# Patient Record
Sex: Female | Born: 1965
Health system: Southern US, Community
[De-identification: ages and names within clinical notes are randomized; demographics above are authoritative.]

## PROBLEM LIST (undated history)

## (undated) DIAGNOSIS — Z5112 Encounter for antineoplastic immunotherapy: Secondary | ICD-10-CM

## (undated) DIAGNOSIS — J189 Pneumonia, unspecified organism: Secondary | ICD-10-CM

## (undated) DIAGNOSIS — G43909 Migraine, unspecified, not intractable, without status migrainosus: Secondary | ICD-10-CM

## (undated) DIAGNOSIS — C3490 Malignant neoplasm of unspecified part of unspecified bronchus or lung: Secondary | ICD-10-CM

## (undated) DIAGNOSIS — E785 Hyperlipidemia, unspecified: Secondary | ICD-10-CM

## (undated) DIAGNOSIS — H9192 Unspecified hearing loss, left ear: Secondary | ICD-10-CM

## (undated) DIAGNOSIS — K219 Gastro-esophageal reflux disease without esophagitis: Secondary | ICD-10-CM

## (undated) DIAGNOSIS — R112 Nausea with vomiting, unspecified: Secondary | ICD-10-CM

## (undated) DIAGNOSIS — R918 Other nonspecific abnormal finding of lung field: Secondary | ICD-10-CM

## (undated) DIAGNOSIS — I479 Paroxysmal tachycardia, unspecified: Secondary | ICD-10-CM

## (undated) DIAGNOSIS — K5790 Diverticulosis of intestine, part unspecified, without perforation or abscess without bleeding: Secondary | ICD-10-CM

## (undated) DIAGNOSIS — Z973 Presence of spectacles and contact lenses: Secondary | ICD-10-CM

## (undated) DIAGNOSIS — Z5111 Encounter for antineoplastic chemotherapy: Secondary | ICD-10-CM

## (undated) DIAGNOSIS — R1310 Dysphagia, unspecified: Secondary | ICD-10-CM

## (undated) DIAGNOSIS — R0600 Dyspnea, unspecified: Secondary | ICD-10-CM

## (undated) DIAGNOSIS — J309 Allergic rhinitis, unspecified: Secondary | ICD-10-CM

## (undated) DIAGNOSIS — Z9889 Other specified postprocedural states: Secondary | ICD-10-CM

## (undated) HISTORY — DX: Gastro-esophageal reflux disease without esophagitis: K21.9

## (undated) HISTORY — DX: Encounter for antineoplastic chemotherapy: Z51.11

## (undated) HISTORY — DX: Diverticulosis of intestine, part unspecified, without perforation or abscess without bleeding: K57.90

## (undated) HISTORY — DX: Encounter for antineoplastic immunotherapy: Z51.12

## (undated) HISTORY — PX: ABDOMINAL HYSTERECTOMY: SHX81

## (undated) HISTORY — DX: Dysphagia, unspecified: R13.10

## (undated) HISTORY — DX: Migraine, unspecified, not intractable, without status migrainosus: G43.909

## (undated) HISTORY — PX: WRIST FRACTURE SURGERY: SHX121

## (undated) HISTORY — DX: Paroxysmal tachycardia, unspecified: I47.9

## (undated) HISTORY — DX: Allergic rhinitis, unspecified: J30.9

---

## 2001-06-01 ENCOUNTER — Encounter: Payer: Self-pay | Admitting: Emergency Medicine

## 2001-06-01 ENCOUNTER — Emergency Department (HOSPITAL_COMMUNITY): Admission: EM | Admit: 2001-06-01 | Discharge: 2001-06-02 | Payer: Self-pay | Admitting: Emergency Medicine

## 2001-06-25 ENCOUNTER — Other Ambulatory Visit: Admission: RE | Admit: 2001-06-25 | Discharge: 2001-06-25 | Payer: Self-pay | Admitting: Obstetrics and Gynecology

## 2002-08-23 ENCOUNTER — Other Ambulatory Visit: Admission: RE | Admit: 2002-08-23 | Discharge: 2002-08-23 | Payer: Self-pay | Admitting: Obstetrics and Gynecology

## 2003-09-04 ENCOUNTER — Other Ambulatory Visit: Admission: RE | Admit: 2003-09-04 | Discharge: 2003-09-04 | Payer: Self-pay | Admitting: Obstetrics and Gynecology

## 2004-01-27 ENCOUNTER — Encounter: Admission: RE | Admit: 2004-01-27 | Discharge: 2004-01-27 | Payer: Self-pay | Admitting: Family Medicine

## 2004-10-06 ENCOUNTER — Other Ambulatory Visit: Admission: RE | Admit: 2004-10-06 | Discharge: 2004-10-06 | Payer: Self-pay | Admitting: Obstetrics and Gynecology

## 2008-05-02 ENCOUNTER — Encounter: Admission: RE | Admit: 2008-05-02 | Discharge: 2008-05-02 | Payer: Self-pay | Admitting: Gastroenterology

## 2010-06-20 ENCOUNTER — Encounter: Payer: Self-pay | Admitting: Family Medicine

## 2015-05-13 ENCOUNTER — Ambulatory Visit: Payer: Self-pay

## 2015-06-25 ENCOUNTER — Ambulatory Visit
Admission: RE | Admit: 2015-06-25 | Discharge: 2015-06-25 | Disposition: A | Discharge status: Home / Self Care | Payer: BLUE CROSS/BLUE SHIELD | Source: Ambulatory Visit | Attending: Family Medicine | Admitting: Family Medicine

## 2015-06-25 ENCOUNTER — Other Ambulatory Visit: Payer: Self-pay | Admitting: Family Medicine

## 2015-06-25 DIAGNOSIS — R0781 Pleurodynia: Secondary | ICD-10-CM

## 2015-06-25 MED ORDER — IOPAMIDOL (ISOVUE-370) INJECTION 76%
100.0000 mL | Freq: Once | INTRAVENOUS | Status: AC | PRN
  Administered 2015-06-25: 100 mL via INTRAVENOUS

## 2015-06-26 ENCOUNTER — Encounter (INDEPENDENT_AMBULATORY_CARE_PROVIDER_SITE_OTHER): Payer: Self-pay

## 2015-06-26 ENCOUNTER — Other Ambulatory Visit (INDEPENDENT_AMBULATORY_CARE_PROVIDER_SITE_OTHER): Payer: BLUE CROSS/BLUE SHIELD

## 2015-06-26 ENCOUNTER — Ambulatory Visit (INDEPENDENT_AMBULATORY_CARE_PROVIDER_SITE_OTHER): Payer: BLUE CROSS/BLUE SHIELD | Admitting: Pulmonary Disease

## 2015-06-26 ENCOUNTER — Encounter: Payer: Self-pay | Admitting: Pulmonary Disease

## 2015-06-26 VITALS — BP 120/62 | HR 65 | Temp 98.1°F | Ht 68.5 in | Wt 209.8 lb

## 2015-06-26 DIAGNOSIS — R911 Solitary pulmonary nodule: Secondary | ICD-10-CM

## 2015-06-26 DIAGNOSIS — J181 Lobar pneumonia, unspecified organism: Secondary | ICD-10-CM

## 2015-06-26 LAB — APTT: APTT: 31.7 s (ref 23.4–32.7)

## 2015-06-26 LAB — PROTIME-INR
INR: 1.1 ratio — AB (ref 0.8–1.0)
PROTHROMBIN TIME: 11.6 s (ref 9.6–13.1)

## 2015-06-26 NOTE — Progress Notes (Addendum)
Subjective:    Patient ID: Debbie Baker, female    DOB: 1965-11-12, 50 y.o.   MRN: [id number]  HPI Consult for abnormal CT scan.  Ms. Drury is a 50 year old with past medical history as below. She's been complaining of cough, sinus congestion since Thanksgiving. She was treated with a Z-Pak which helped with the sinus symptoms but she still has occasional cough with clear mucus. She also produces blood-tinged mucus at times when she clears her throat. It is not particularly associated with cough. No dyspnea, wheezing. She had a CTA done yesterday which showed a left upper lobe consolidation. She has some chills but denies any fevers. WBC count done yesterday was mildly elevated at 11.7  DATA: WBC count 06/25/15- 11.7 Platelets- 334  CT scan 06/26/15 Images reviewed. Left upper lobe consolidative changes, reactive left hilar adenopathy  Past medical history: Dysphagia Esophageal reflux Allergic rhinitis Diverticulosis Migraine headache Hyperlipidemia  Social history: 1 pack per day for 30 years. Quit in 2012. Occasional alcohol use no illegal drug use. She is currently not employed. She lives with her boyfriend and 10 cats  Family history: Allergies- Mother Esophageal cancer-father   Current outpatient prescriptions:  .  cetirizine (ZYRTEC) 10 MG tablet, Take 10 mg by mouth daily., Disp: , Rfl:  .  cholecalciferol (VITAMIN D) 1000 units tablet, Take 1,000 Units by mouth daily., Disp: , Rfl:  .  Echinacea 400 MG CAPS, Take 2 capsules by mouth daily., Disp: , Rfl:  .  estrogen-methylTESTOSTERone (ESTRATEST) 1.25-2.5 MG tablet, Take 1 tablet by mouth daily., Disp: , Rfl:  .  fluticasone (FLONASE) 50 MCG/ACT nasal spray, Place 2 sprays into both nostrils daily as needed for allergies or rhinitis., Disp: , Rfl:  .  levofloxacin (LEVAQUIN) 500 MG tablet, Take 500 mg by mouth daily. x10days, Disp: , Rfl: 0 .  magnesium oxide (MAG-OX) 400 MG tablet, Take 400 mg by mouth daily., Disp: ,  Rfl:  .  Multiple Vitamin (MULTIVITAMIN) capsule, Take 1 capsule by mouth daily., Disp: , Rfl:  .  omega-3 acid ethyl esters (LOVAZA) 1 g capsule, Take 2 g by mouth 2 (two) times daily., Disp: , Rfl:  .  Omeprazole-Sodium Bicarbonate (ZEGERID) 20-1100 MG CAPS capsule, Take 1 capsule by mouth 2 (two) times daily., Disp: , Rfl:  .  pravastatin (PRAVACHOL) 10 MG tablet, Take 10 mg by mouth daily., Disp: , Rfl:  .  zinc gluconate 50 MG tablet, Take 50 mg by mouth daily., Disp: , Rfl:    Review of Systems Cough with clear mucus, occasional blood-tinged mucus. Have chills, no fevers. No chest pain, palpitation. No nausea, vomiting, diarrhea, constipation. All other review of systems is negative    Objective:   Physical Exam Blood pressure 120/62, pulse 65, temperature 98.1 F (36.7 C), temperature source Oral, height 5' 8.5" (1.74 m), weight 209 lb 12.8 oz (95.165 kg), SpO2 98 %.  Gen: No apparent distress Neuro: No gross focal deficits. Neck: No JVD, lymphadenopathy, thyromegaly. RS: Clear, no wheeze, crackles CVS: S1-S2 heard, no murmurs rubs gallops. Abdomen: Soft, positive bowel sounds. Extremities: No edema.    Assessment & Plan:  Evaluation for Abnormal CT.  Review of the CT images shows left upper lobe consolidative changes that looks most likely inflammatory, infectious consistent with pneumonia. However she has heavy smoking history and has some hemoptysis so it is reasonable to do a bronchoscopic airway inspection and BAL, transbronchial biopsies of the left upper lobe. The hemoptysis may have a upper  airway source since this occurs only when she clears her throat and not when she coughs.  She wants to schedule the scope for next Friday, February 3 since this is the only time she can get transportation. She will continue using the Levaquin for a full 10 day course.  PLAN: - Continue Levaquin for a total 10 days - Scheduled for bronchoscope for 07/03/15/.  Marshell Garfinkel  MD  Pulmonary and Critical Care Pager [phone] If no answer or after 3pm call: [phone] 06/26/2015, 5:11 PM

## 2015-06-26 NOTE — Patient Instructions (Signed)
We will check some blood test today. Schedule for bronchoscope on Friday Feb 3rd.

## 2015-06-28 ENCOUNTER — Encounter: Payer: Self-pay | Admitting: Pulmonary Disease

## 2015-06-29 ENCOUNTER — Telehealth: Payer: Self-pay | Admitting: Pulmonary Disease

## 2015-06-29 NOTE — Telephone Encounter (Signed)
Yes, had called pt to inform her of bronch appt LMOM TCB x1

## 2015-06-29 NOTE — Telephone Encounter (Signed)
Pt is aware of her bronch appointment date and time. Nothing further was needed.

## 2015-06-29 NOTE — Telephone Encounter (Signed)
Patient says that she received a call from [place].  Did not see notes in chart to find out what Jess called about.  Patient says that she is scheduled to have a Bronch on 2/3 and did not receive any instructions from the hospital at all as to preparing for procedure, she said that she thinks that Keane Scrape may have been calling her regarding that.  To Jess for follow up

## 2015-06-29 NOTE — Telephone Encounter (Signed)
Pt returning call pt wanting to know if you can e-mail her or leaver msg on phone .Debbie Baker

## 2015-06-29 NOTE — Telephone Encounter (Signed)
Dr. Mannam - please advise. Thanks. 

## 2015-07-03 ENCOUNTER — Ambulatory Visit (HOSPITAL_COMMUNITY)
Admission: RE | Admit: 2015-07-03 | Discharge: 2015-07-03 | Disposition: A | Discharge status: Home / Self Care | Payer: BLUE CROSS/BLUE SHIELD | Source: Ambulatory Visit | Attending: Pulmonary Disease | Admitting: Pulmonary Disease

## 2015-07-03 ENCOUNTER — Encounter (HOSPITAL_COMMUNITY)
Admission: RE | Disposition: A | Discharge status: Home / Self Care | Payer: Self-pay | Source: Ambulatory Visit | Attending: Pulmonary Disease

## 2015-07-03 ENCOUNTER — Ambulatory Visit (HOSPITAL_COMMUNITY): Payer: BLUE CROSS/BLUE SHIELD

## 2015-07-03 ENCOUNTER — Encounter (HOSPITAL_COMMUNITY): Payer: Self-pay

## 2015-07-03 DIAGNOSIS — R042 Hemoptysis: Secondary | ICD-10-CM | POA: Insufficient documentation

## 2015-07-03 DIAGNOSIS — R911 Solitary pulmonary nodule: Secondary | ICD-10-CM | POA: Insufficient documentation

## 2015-07-03 DIAGNOSIS — R942 Abnormal results of pulmonary function studies: Secondary | ICD-10-CM | POA: Insufficient documentation

## 2015-07-03 HISTORY — PX: VIDEO BRONCHOSCOPY: SHX5072

## 2015-07-03 LAB — BODY FLUID CELL COUNT WITH DIFFERENTIAL
EOS FL: 2 %
LYMPHS FL: 7 %
MONOCYTE-MACROPHAGE-SEROUS FLUID: 91 % — AB (ref 50–90)
NEUTROPHIL FLUID: 0 % (ref 0–25)
Total Nucleated Cell Count, Fluid: 450 cu mm (ref 0–1000)

## 2015-07-03 SURGERY — BRONCHOSCOPY, WITH FLUOROSCOPY
Anesthesia: Moderate Sedation | Laterality: Bilateral

## 2015-07-03 MED ORDER — MIDAZOLAM HCL 10 MG/2ML IJ SOLN
INTRAMUSCULAR | Status: DC | PRN
  Administered 2015-07-03 (×5): 1 mg via INTRAVENOUS

## 2015-07-03 MED ORDER — PHENYLEPHRINE HCL 0.25 % NA SOLN
NASAL | Status: DC | PRN
  Administered 2015-07-03: 2 via NASAL

## 2015-07-03 MED ORDER — SODIUM CHLORIDE 0.9 % IV SOLN
INTRAVENOUS | Status: DC
  Administered 2015-07-03: 09:00:00 via INTRAVENOUS

## 2015-07-03 MED ORDER — LIDOCAINE HCL 2 % EX GEL
CUTANEOUS | Status: DC | PRN
Start: 2015-07-03 — End: 2015-07-03
  Administered 2015-07-03: 2

## 2015-07-03 MED ORDER — PHENYLEPHRINE HCL 0.25 % NA SOLN: 1.0000 | Freq: Four times a day (QID) | NASAL | Status: DC | PRN

## 2015-07-03 MED ORDER — LIDOCAINE HCL 2 % EX GEL: 1.0000 "application " | Freq: Once | CUTANEOUS | Status: DC

## 2015-07-03 MED ORDER — FENTANYL CITRATE (PF) 100 MCG/2ML IJ SOLN
INTRAMUSCULAR | Status: AC
  Filled 2015-07-03: qty 4

## 2015-07-03 MED ORDER — LIDOCAINE HCL 1 % IJ SOLN
INTRAMUSCULAR | Status: DC | PRN
  Administered 2015-07-03: 6 mL via RESPIRATORY_TRACT

## 2015-07-03 MED ORDER — BUTAMBEN-TETRACAINE-BENZOCAINE 2-2-14 % EX AERO: 1.0000 | INHALATION_SPRAY | Freq: Once | CUTANEOUS | Status: DC

## 2015-07-03 MED ORDER — MIDAZOLAM HCL 5 MG/ML IJ SOLN
INTRAMUSCULAR | Status: AC
  Filled 2015-07-03: qty 2

## 2015-07-03 MED ORDER — FENTANYL CITRATE (PF) 100 MCG/2ML IJ SOLN
INTRAMUSCULAR | Status: DC | PRN
  Administered 2015-07-03 (×4): 25 ug via INTRAVENOUS

## 2015-07-03 NOTE — Telephone Encounter (Signed)
Pt had bronch today.  Will close.

## 2015-07-03 NOTE — H&P (Signed)
Pt seen and examined in endo suite. She feels well. States that she has no hemoptysis over the past 2 days.   Blood pressure 98/69, pulse 73, resp. rate 15, SpO2 90 %. No distress CVS- RRR, no MRG RS- Clear Abd- Soft, + BS Ext- No edema  Assessment:  Anormal CT scan Hemoptysis Active smoker.  See office note from 1/27 for more details. Will proceed with bronchoscopy.  Marshell Garfinkel MD [place] [facility] Pager [phone] If no answer or after 3pm call: [phone] 07/03/2015, 10:09 AM

## 2015-07-03 NOTE — Procedures (Signed)
PCCM Video Bronchoscopy Procedure Note  The patient was informed of the risks (including but not limited to bleeding, infection, respiratory failure, lung injury, tooth/oral injury) and benefits of the procedure and gave consent, see chart.  Indication: Evaluation of hemoptysis, abnormal CT scan with LUL consolidation.   Post Procedure Diagnosis: Same  Location: LUL  Condition pre procedure: Stable  Medications for procedure: 5 mg versed, 100 mcg fentanyl in divided doses.   Procedure description: The bronchoscope was introduced through the mouth and passed to the bilateral lungs to the level of the subsegmental bronchi throughout the tracheobronchial tree.  Airway exam revealed normal anatomy, no endobronchial lesions or bleeding noted. No bleeding noted in the nose or oropharynx.  Procedures performed: LUL BAL, brushings, and transbronchial biopsies, concentrating on the anterior segments.  Specimens sent: Cultures, cytology, cell count, path.  Condition post procedure: Stable  EBL: 5cc  Complications: None noted. CXR is pending.  Marshell Garfinkel MD [place] [facility] Pager [phone] If no answer or after 3pm call: [phone] 07/03/2015, 10:14 AM

## 2015-07-03 NOTE — Discharge Instructions (Signed)
Flexible Bronchoscopy, Care After These instructions give you information on caring for yourself after your procedure. Your doctor may also give you more specific instructions. Call your doctor if you have any problems or questions after your procedure. HOME CARE  Do not eat or drink anything for 2 hours after your procedure. If you try to eat or drink before the medicine wears off, food or drink could go into your lungs. You could also burn yourself.  After 2 hours have passed and when you can cough and gag normally, you may eat soft food and drink liquids slowly.  The day after the test, you may eat your normal diet.  You may do your normal activities.  Keep all doctor visits. GET HELP RIGHT AWAY IF:  You get more and more short of breath.  You get light-headed.  You feel like you are going to pass out (faint).  You have chest pain.  You have new problems that worry you.  You cough up more than a little blood.  You cough up more blood than before. MAKE SURE YOU:  Understand these instructions.  Will watch your condition.  Will get help right away if you are not doing well or get worse.   This information is not intended to replace advice given to you by your health care provider. Make sure you discuss any questions you have with your health care provider.   Document Released: 03/13/2009 Document Revised: 05/21/2013 Document Reviewed: 01/18/2013 Elsevier Interactive Patient Education 2016 [employer].  Nothing to  Eat or drink until      1130            am          Today  07/03/2015

## 2015-07-03 NOTE — Progress Notes (Signed)
Video bronchoscopy done  Intervention bronchial washing Intervention bronchial biopsy Intervention bronchial brushing  Pt tolerated well

## 2015-07-06 ENCOUNTER — Encounter (HOSPITAL_COMMUNITY): Payer: Self-pay | Admitting: Pulmonary Disease

## 2015-07-06 LAB — CULTURE, BAL-QUANTITATIVE W GRAM STAIN: Colony Count: NO GROWTH

## 2015-07-06 LAB — CULTURE, BAL-QUANTITATIVE
CULTURE: NO GROWTH
SPECIAL REQUESTS: NORMAL

## 2015-07-08 LAB — PNEUMOCYSTIS JIROVECI SMEAR BY DFA: Pneumocystis jiroveci Ag: NEGATIVE

## 2015-07-21 LAB — LEGIONELLA CULTURE: SPECIAL REQUESTS: NORMAL

## 2015-07-24 ENCOUNTER — Ambulatory Visit (INDEPENDENT_AMBULATORY_CARE_PROVIDER_SITE_OTHER): Payer: BLUE CROSS/BLUE SHIELD | Admitting: Pulmonary Disease

## 2015-07-24 ENCOUNTER — Encounter: Payer: Self-pay | Admitting: Pulmonary Disease

## 2015-07-24 ENCOUNTER — Telehealth: Payer: Self-pay | Admitting: Pulmonary Disease

## 2015-07-24 ENCOUNTER — Ambulatory Visit (INDEPENDENT_AMBULATORY_CARE_PROVIDER_SITE_OTHER)
Admission: RE | Admit: 2015-07-24 | Discharge: 2015-07-24 | Disposition: A | Discharge status: Home / Self Care | Payer: BLUE CROSS/BLUE SHIELD | Source: Ambulatory Visit | Attending: Pulmonary Disease | Admitting: Pulmonary Disease

## 2015-07-24 VITALS — BP 126/68 | HR 66 | Ht 68.5 in | Wt 206.8 lb

## 2015-07-24 DIAGNOSIS — R0600 Dyspnea, unspecified: Secondary | ICD-10-CM | POA: Diagnosis not present

## 2015-07-24 MED ORDER — DOXYCYCLINE HYCLATE 100 MG PO TABS: 100.0000 mg | ORAL_TABLET | Freq: Two times a day (BID) | ORAL | Status: DC

## 2015-07-24 MED ORDER — PANTOPRAZOLE SODIUM 40 MG PO TBEC: 40.0000 mg | DELAYED_RELEASE_TABLET | Freq: Every day | ORAL | Status: DC

## 2015-07-24 NOTE — Progress Notes (Signed)
Subjective:    Patient ID: Debbie Baker, female    DOB: 09-May-1966, 50 y.o.   MRN: [id number]  HPI Follow up for abnormal CT scan.  Debbie Baker is a 50 year old with past medical history as below. She's been complaining of cough, sinus congestion since Thanksgiving. She was treated with a Z-Pak and then with lebvaquin for 10 days. She also produces blood-tinged mucus at times when she clears her throat. It is not particularly associated with cough. No dyspnea, wheezing. She had a CTA done which showed a left upper lobe consolidation. She has some chills but denies any fevers.  She underwent a bronchoscopy on 2/3 which did not show any bleeding from lungs. All cultures are negative. Biopsies do not show any evidence of malignancy. Her symptoms improved but then returned again a few days ago. She has cough, nonproductive, mucus with blood in it when she clears her throat. No fevers or chills.   DATA: WBC count 06/25/15- 11.7 Platelets- 334  CT scan 06/26/15 Images reviewed. Left upper lobe consolidative changes, reactive left hilar adenopathy.  Bronch 07/03/15 Pathology, cytology- No evidence of malignancy. Cultures- Negative to date Cell count- WBC 450 with 7% lymphs, 2% eosinophils, 91% monocyte macrophages  Past medical history: Dysphagia Esophageal reflux Allergic rhinitis Diverticulosis Migraine headache Hyperlipidemia  Social history: 1 pack per day for 30 years. Quit in 2012. Occasional alcohol use no illegal drug use. She is currently not employed. She lives with her boyfriend and 10 cats  Family history: Allergies- Mother Esophageal cancer-father   Current outpatient prescriptions:  .  cetirizine (ZYRTEC) 10 MG tablet, Take 10 mg by mouth daily., Disp: , Rfl:  .  cholecalciferol (VITAMIN D) 1000 units tablet, Take 1,000 Units by mouth daily., Disp: , Rfl:  .  Echinacea 400 MG CAPS, Take 2 capsules by mouth daily., Disp: , Rfl:  .  estrogen-methylTESTOSTERone (ESTRATEST)  1.25-2.5 MG tablet, Take 1 tablet by mouth daily., Disp: , Rfl:  .  fluticasone (FLONASE) 50 MCG/ACT nasal spray, Place 2 sprays into both nostrils daily as needed for allergies or rhinitis., Disp: , Rfl:  .  levofloxacin (LEVAQUIN) 500 MG tablet, Take 500 mg by mouth daily. Reported on 06/30/2015, Disp: , Rfl: 0 .  magnesium oxide (MAG-OX) 400 MG tablet, Take 400 mg by mouth daily., Disp: , Rfl:  .  Multiple Vitamin (MULTIVITAMIN) capsule, Take 1 capsule by mouth daily., Disp: , Rfl:  .  naproxen sodium (ANAPROX) 220 MG tablet, Take 220 mg by mouth daily as needed (for pain)., Disp: , Rfl:  .  omega-3 acid ethyl esters (LOVAZA) 1 g capsule, Take 2 g by mouth 2 (two) times daily., Disp: , Rfl:  .  Omeprazole-Sodium Bicarbonate (ZEGERID) 20-1100 MG CAPS capsule, Take 1 capsule by mouth 2 (two) times daily., Disp: , Rfl:  .  pravastatin (PRAVACHOL) 10 MG tablet, Take 10 mg by mouth daily., Disp: , Rfl:  .  zinc gluconate 50 MG tablet, Take 50 mg by mouth daily., Disp: , Rfl:    Review of Systems Cough with clear mucus, occasional blood-tinged mucus. Have chills, no fevers. No chest pain, palpitation. No nausea, vomiting, diarrhea, constipation. All other review of systems is negative    Objective:   Physical Exam Blood pressure 126/68, pulse 66, height 5' 8.5" (1.74 m), weight 206 lb 12.8 oz (93.804 kg), SpO2 97 %.  Gen: No apparent distress Neuro: No gross focal deficits. Neck: No JVD, lymphadenopathy, thyromegaly. RS: Clear, no wheeze, crackles CVS:  S1-S2 heard, no murmurs rubs gallops. Abdomen: Soft, positive bowel sounds. Extremities: No edema.    Assessment & Plan:  Evaluation for Abnormal CT.  CT images shows left upper lobe consolidative changes that looks most likely inflammatory, infectious consistent with pneumonia, aspiration. Bronch results are negative so far.   The hemoptysis likely has an upper airway source related to GERD since this occurs only when she clears her  throat and not when she coughs. She has significant problems with acid reflux and has noticed heartburn before she develops the symptoms of cough, blood in the mucus. She has dysphagia to food and may have esophageal dysmotility as well.    I will get a CXR today and qive her another course of abx, doxy for 7 days. She will get a follow up CT scan in 3 months. I have asked her to discontinue her Zegerid and prescribed protonix 40 mg bid. She will follow up with Dr. Rex Kras and should be considered for repeat GI and/or ENT eval.  PLAN: - Doxy for 7 days - CXR today - Prescribe protonix 40 mg bid. D/C Zegerid. - Follow up CT without contrast in 2 months.  Marshell Garfinkel MD [place] [facility] Pager [phone] If no answer or after 3pm call: [phone] 07/24/2015, 3:52 PM

## 2015-07-24 NOTE — Telephone Encounter (Signed)
Call report received from [facility] Radiology on today's cxr: IMPRESSION: There is new area of patchy consolidation left perihilar region. Persistent 2 foci of nodular consolidation in left upper lobe. Findings highly suspicious for recurrent or worsening bronchopneumonia. Follow-up to resolution recommended.  Discussed with PM Per PM: she was given abx at today's visit and treated for influenza.  Please call patient and have her continue with today's recs but if she worsens over the weekend, she needs to go to he ER.  Thanks.  Called spoke with patient, discussed the above with her.  Pt voiced her understanding and denied any questions/concerns at this time.  Pt is already scheduled for her follow up CT on 4.14.17. Will sign off.

## 2015-07-24 NOTE — Patient Instructions (Addendum)
We will prescribe you doxycycline 100 mg twice a day for 7 days. We will prescribe Protonix 40 mg twice a day to be taken instead of the Zegerid. CXR today Follow up CT scan in 2 months.  Return to clinic after CT scan.

## 2015-07-26 ENCOUNTER — Encounter: Payer: Self-pay | Admitting: Pulmonary Disease

## 2015-07-27 ENCOUNTER — Encounter: Payer: Self-pay | Admitting: Pulmonary Disease

## 2015-07-27 MED ORDER — PANTOPRAZOLE SODIUM 40 MG PO TBEC: 40.0000 mg | DELAYED_RELEASE_TABLET | Freq: Two times a day (BID) | ORAL | Status: DC

## 2015-07-28 NOTE — Telephone Encounter (Signed)
Dr. Mannam - please advise. Thanks. 

## 2015-07-29 NOTE — Telephone Encounter (Signed)
Bronchoscope procedure was denied by insurance. Can we find out how can we revise the indication to lung nodule, hemoptysis and appeal to the insurance?

## 2015-07-29 NOTE — Telephone Encounter (Signed)
This message has been given to Kathlee Nations to look into- will update as we have updates to give.

## 2015-08-01 LAB — FUNGUS CULTURE W SMEAR
FUNGAL SMEAR: NONE SEEN
Special Requests: NORMAL

## 2015-08-02 ENCOUNTER — Emergency Department (HOSPITAL_BASED_OUTPATIENT_CLINIC_OR_DEPARTMENT_OTHER)
Admission: EM | Admit: 2015-08-02 | Discharge: 2015-08-02 | Disposition: A | Discharge status: Home / Self Care | Payer: BLUE CROSS/BLUE SHIELD | Attending: Emergency Medicine | Admitting: Emergency Medicine

## 2015-08-02 ENCOUNTER — Encounter (HOSPITAL_BASED_OUTPATIENT_CLINIC_OR_DEPARTMENT_OTHER): Payer: Self-pay | Admitting: *Deleted

## 2015-08-02 ENCOUNTER — Telehealth: Payer: Self-pay | Admitting: Pulmonary Disease

## 2015-08-02 ENCOUNTER — Emergency Department (HOSPITAL_BASED_OUTPATIENT_CLINIC_OR_DEPARTMENT_OTHER): Payer: BLUE CROSS/BLUE SHIELD

## 2015-08-02 DIAGNOSIS — R0781 Pleurodynia: Secondary | ICD-10-CM | POA: Diagnosis not present

## 2015-08-02 DIAGNOSIS — E785 Hyperlipidemia, unspecified: Secondary | ICD-10-CM | POA: Insufficient documentation

## 2015-08-02 DIAGNOSIS — Z79899 Other long term (current) drug therapy: Secondary | ICD-10-CM | POA: Diagnosis not present

## 2015-08-02 DIAGNOSIS — Z792 Long term (current) use of antibiotics: Secondary | ICD-10-CM | POA: Diagnosis not present

## 2015-08-02 DIAGNOSIS — J181 Lobar pneumonia, unspecified organism: Secondary | ICD-10-CM

## 2015-08-02 DIAGNOSIS — Z87891 Personal history of nicotine dependence: Secondary | ICD-10-CM | POA: Diagnosis not present

## 2015-08-02 DIAGNOSIS — K219 Gastro-esophageal reflux disease without esophagitis: Secondary | ICD-10-CM | POA: Diagnosis not present

## 2015-08-02 DIAGNOSIS — Z880 Allergy status to penicillin: Secondary | ICD-10-CM | POA: Insufficient documentation

## 2015-08-02 DIAGNOSIS — R0602 Shortness of breath: Secondary | ICD-10-CM | POA: Diagnosis present

## 2015-08-02 HISTORY — DX: Hyperlipidemia, unspecified: E78.5

## 2015-08-02 HISTORY — DX: Pneumonia, unspecified organism: J18.9

## 2015-08-02 LAB — CBC WITH DIFFERENTIAL/PLATELET
BASOS PCT: 0 %
Basophils Absolute: 0 10*3/uL (ref 0.0–0.1)
EOS ABS: 0.2 10*3/uL (ref 0.0–0.7)
Eosinophils Relative: 2 %
HCT: 40.9 % (ref 36.0–46.0)
HEMOGLOBIN: 13.7 g/dL (ref 12.0–15.0)
LYMPHS ABS: 2.5 10*3/uL (ref 0.7–4.0)
Lymphocytes Relative: 24 %
MCH: 32 pg (ref 26.0–34.0)
MCHC: 33.5 g/dL (ref 30.0–36.0)
MCV: 95.6 fL (ref 78.0–100.0)
Monocytes Absolute: 0.7 10*3/uL (ref 0.1–1.0)
Monocytes Relative: 7 %
NEUTROS ABS: 7 10*3/uL (ref 1.7–7.7)
Neutrophils Relative %: 67 %
Platelets: 330 10*3/uL (ref 150–400)
RBC: 4.28 MIL/uL (ref 3.87–5.11)
RDW: 12 % (ref 11.5–15.5)
WBC: 10.4 10*3/uL (ref 4.0–10.5)

## 2015-08-02 LAB — BASIC METABOLIC PANEL
ANION GAP: 10 (ref 5–15)
BUN: 9 mg/dL (ref 6–20)
CALCIUM: 9.1 mg/dL (ref 8.9–10.3)
CHLORIDE: 103 mmol/L (ref 101–111)
CO2: 24 mmol/L (ref 22–32)
Creatinine, Ser: 0.79 mg/dL (ref 0.44–1.00)
GFR calc non Af Amer: >60 mL/min (ref >60)
Glucose, Bld: 126 mg/dL — ABNORMAL HIGH (ref 65–99)
Potassium: 3.7 mmol/L (ref 3.5–5.1)
Sodium: 137 mmol/L (ref 135–145)

## 2015-08-02 MED ORDER — PANTOPRAZOLE SODIUM 40 MG PO TBEC: 40.0000 mg | DELAYED_RELEASE_TABLET | Freq: Two times a day (BID) | ORAL | Status: DC

## 2015-08-02 MED ORDER — PANTOPRAZOLE SODIUM 40 MG IV SOLR
40.0000 mg | Freq: Once | INTRAVENOUS | Status: AC
  Administered 2015-08-02: 40 mg via INTRAVENOUS
  Filled 2015-08-02: qty 40

## 2015-08-02 MED ORDER — BENZONATATE 100 MG PO CAPS: 100.0000 mg | ORAL_CAPSULE | Freq: Every evening | ORAL | Status: DC | PRN

## 2015-08-02 MED ORDER — KETOROLAC TROMETHAMINE 30 MG/ML IJ SOLN
30.0000 mg | Freq: Once | INTRAMUSCULAR | Status: AC
  Administered 2015-08-02: 30 mg via INTRAVENOUS
  Filled 2015-08-02: qty 1

## 2015-08-02 MED ORDER — GI COCKTAIL ~~LOC~~
30.0000 mL | Freq: Once | ORAL | Status: AC
  Administered 2015-08-02: 30 mL via ORAL
  Filled 2015-08-02: qty 30

## 2015-08-02 MED ORDER — IOHEXOL 300 MG/ML  SOLN
100.0000 mL | Freq: Once | INTRAMUSCULAR | Status: AC | PRN
  Administered 2015-08-02: 100 mL via INTRAVENOUS

## 2015-08-02 MED ORDER — IPRATROPIUM-ALBUTEROL 0.5-2.5 (3) MG/3ML IN SOLN
3.0000 mL | Freq: Once | RESPIRATORY_TRACT | Status: AC
  Administered 2015-08-02: 3 mL via RESPIRATORY_TRACT
  Filled 2015-08-02: qty 3

## 2015-08-02 NOTE — Discharge Instructions (Signed)
Read the information below.  Use the prescribed medication as directed.  Please discuss all new medications with your pharmacist.  You may return to the Emergency Department at any time for worsening condition or any new symptoms that concern you.    If you develop worsening chest pain, shortness of breath, fever, you pass out, or become weak or dizzy, return to the ER for a recheck.     Please follow closely with your pulmonologist and make an appointment with gastroenterology for further evaluation.   Food Choices for Gastroesophageal Reflux Disease, Adult When you have gastroesophageal reflux disease (GERD), the foods you eat and your eating habits are very important. Choosing the right foods can help ease the discomfort of GERD. WHAT GENERAL GUIDELINES DO I NEED TO FOLLOW?  Choose fruits, vegetables, whole grains, low-fat dairy products, and low-fat meat, fish, and poultry.  Limit fats such as oils, salad dressings, butter, nuts, and avocado.  Keep a food diary to identify foods that cause symptoms.  Avoid foods that cause reflux. These may be different for different people.  Eat frequent small meals instead of three large meals each day.  Eat your meals slowly, in a relaxed setting.  Limit fried foods.  Cook foods using methods other than frying.  Avoid drinking alcohol.  Avoid drinking large amounts of liquids with your meals.  Avoid bending over or lying down until 2-3 hours after eating. WHAT FOODS ARE NOT RECOMMENDED? The following are some foods and drinks that may worsen your symptoms: Vegetables Tomatoes. Tomato juice. Tomato and spaghetti sauce. Chili peppers. Onion and garlic. Horseradish. Fruits Oranges, grapefruit, and lemon (fruit and juice). Meats High-fat meats, fish, and poultry. This includes hot dogs, ribs, ham, sausage, salami, and bacon. Dairy Whole milk and chocolate milk. Sour cream. Cream. Butter. Ice cream. Cream cheese.  Beverages Coffee and tea,  with or without caffeine. Carbonated beverages or energy drinks. Condiments Hot sauce. Barbecue sauce.  Sweets/Desserts Chocolate and cocoa. Donuts. Peppermint and spearmint. Fats and Oils High-fat foods, including [place] fries and potato chips. Other Vinegar. Strong spices, such as black pepper, white pepper, red pepper, cayenne, curry powder, cloves, ginger, and chili powder. The items listed above may not be a complete list of foods and beverages to avoid. Contact your dietitian for more information.   This information is not intended to replace advice given to you by your health care provider. Make sure you discuss any questions you have with your health care provider.   Document Released: 05/16/2005 Document Revised: 06/06/2014 Document Reviewed: 03/20/2013 Elsevier Interactive Patient Education [employer].

## 2015-08-02 NOTE — Telephone Encounter (Signed)
Spoke to provider at [facility] ED - pleuritic CP & DOE -CT chest reviewed -slight worse LUL ASD -  Noted bscopy neg Will inform dr Vaughan Browner for suggestions

## 2015-08-02 NOTE — ED Notes (Signed)
Pt reports being dx w/ pneumonia in November and has been experiencing continued symptoms, pt has been prescribed a z-pack, levaquin and doxycycline - pt has had a bronchoscope and CT scan. Pt states the pneumonia is r/t to acid reflux and aspiration pneumonia. Pt c/o continued shortness of breath. Pt denies fever.

## 2015-08-02 NOTE — ED Notes (Signed)
Patient transported to CT 

## 2015-08-02 NOTE — ED Provider Notes (Signed)
CSN: [id number]     Arrival date & time 08/02/15  1555 History   First MD Initiated Contact with Patient 08/02/15 1610     Chief Complaint  Patient presents with  . Pneumonia     (Consider location/radiation/quality/duration/timing/severity/associated sxs/prior Treatment) The history is provided by the patient.     Pt with hx acid reflux, recurrent upper lobe bronchopneumonia, former smoker with 30 yr pack hx, p/w persistent symptoms of clear sputum with bloody streaks after clearing her throat in the morning, sharp pleuritic left back pain, SOB.  Pt has had waxing and waning symptoms since Thanksgiving when she ate a large meal, fell asleep and the entire meal came back up into her throat.  She has been seen by pulmonology with CT and bronchoscopy, has been treated with z-pak, levaquin x 10 days, and most recently doxycycline x 7 days.  The blood tinged sputum is thought by pulmonology to be from reflux as it is with clearing her throat, not with coughing, and no blood or abnormalities demonstrated on bronchoscopy.  She was referred back to PCP Dr Rex Kras and recommended to see GI or ENT.  Pt has seen Dr Paulita Fujita in the past but prefers to change GI practices.   Pt denies fever, cough, abdominal pain, N/V, change in bowel habits.   She is not around any smoke, she is does not use strong chemicals at home or at work.  No recent travel.  She has never lived in a foreign country.    Past Medical History  Diagnosis Date  . Pneumonia   . Hyperlipidemia    Past Surgical History  Procedure Laterality Date  . Video bronchoscopy Bilateral 07/03/2015    Procedure: VIDEO BRONCHOSCOPY WITH FLUORO;  Surgeon: Marshell Garfinkel, MD;  Location: [facility];  Service: Cardiopulmonary;  Laterality: Bilateral;  . Abdominal hysterectomy     Family History  Problem Relation Age of Onset  . Esophageal cancer Father    Social History  Substance Use Topics  . Smoking status: Former Smoker -- 1.50 packs/day for 30  years    Types: Cigarettes    Quit date: 08/02/2010  . Smokeless tobacco: None  . Alcohol Use: 0.0 oz/week    0 Standard drinks or equivalent per week     Comment: 2-3 drinks on the weekends   OB History    No data available     Review of Systems  All other systems reviewed and are negative.     Allergies  Penicillins  Home Medications   Prior to Admission medications   Medication Sig Start Date End Date Taking? Authorizing Provider  cetirizine (ZYRTEC) 10 MG tablet Take 10 mg by mouth daily.   Yes Historical Provider, MD  cholecalciferol (VITAMIN D) 1000 units tablet Take 1,000 Units by mouth daily.   Yes Historical Provider, MD  estrogen-methylTESTOSTERone (ESTRATEST) 1.25-2.5 MG tablet Take 1 tablet by mouth daily.   Yes Historical Provider, MD  magnesium oxide (MAG-OX) 400 MG tablet Take 400 mg by mouth daily.   Yes Historical Provider, MD  Multiple Vitamin (MULTIVITAMIN) capsule Take 1 capsule by mouth daily.   Yes Historical Provider, MD  naproxen sodium (ANAPROX) 220 MG tablet Take 220 mg by mouth daily as needed (for pain).   Yes Historical Provider, MD  omega-3 acid ethyl esters (LOVAZA) 1 g capsule Take 2 g by mouth 2 (two) times daily.   Yes Historical Provider, MD  pantoprazole (PROTONIX) 40 MG tablet Take 1 tablet (40 mg total) by  mouth 2 (two) times daily. 07/27/15  Yes Praveen Mannam, MD  pravastatin (PRAVACHOL) 10 MG tablet Take 10 mg by mouth daily.   Yes Historical Provider, MD  zinc gluconate 50 MG tablet Take 50 mg by mouth daily.   Yes Historical Provider, MD  doxycycline (VIBRA-TABS) 100 MG tablet Take 1 tablet (100 mg total) by mouth 2 (two) times daily. 07/24/15   Praveen Mannam, MD  Echinacea 400 MG CAPS Take 2 capsules by mouth daily.    Historical Provider, MD  fluticasone (FLONASE) 50 MCG/ACT nasal spray Place 2 sprays into both nostrils daily as needed for allergies or rhinitis.    Historical Provider, MD  levofloxacin (LEVAQUIN) 500 MG tablet Take 500  mg by mouth daily. Reported on 06/30/2015 06/25/15   Historical Provider, MD  Omeprazole-Sodium Bicarbonate (ZEGERID) 20-1100 MG CAPS capsule Take 1 capsule by mouth 2 (two) times daily.    Historical Provider, MD   BP 130/82 mmHg  Pulse 91  Temp(Src) 98.7 F (37.1 C) (Oral)  Resp 18  Ht '5\' 8"'$  (1.727 m)  Wt 92.987 kg  BMI 31.18 kg/m2  SpO2 94% Physical Exam  Constitutional: She appears well-developed and well-nourished. No distress.  HENT:  Head: Normocephalic and atraumatic.  Mouth/Throat: Oropharynx is clear and moist. No oropharyngeal exudate.  Neck: Normal range of motion. Neck supple.  Cardiovascular: Normal rate and regular rhythm.   Pulmonary/Chest: Effort normal and breath sounds normal. No respiratory distress. She has no wheezes. She has no rales.  Hesitant to inspire deeply  Abdominal: Soft. She exhibits no distension. There is no tenderness. There is no rebound and no guarding.  Neurological: She is alert.  Skin: She is not diaphoretic.  Nursing note and vitals reviewed.   ED Course  Procedures (including critical care time) Labs Review Labs Reviewed  BASIC METABOLIC PANEL - Abnormal; Notable for the following:    Glucose, Bld 126 (*)    All other components within normal limits  CBC WITH DIFFERENTIAL/PLATELET    Imaging Review Dg Chest 2 View  08/02/2015  CLINICAL DATA:  Short of breath and fever. History of aspiration. Shortness of breath. EXAM: CHEST  2 VIEW COMPARISON:  Plain film 07/24/2015 CT of 06/25/2015. FINDINGS: Midline trachea. Normal heart size. Left suprahilar soft tissue fullness, corresponding to soft tissue thickening adenopathy on prior CT. This is similar. Suspect trace left pleural fluid. No pneumothorax. Diffuse peribronchial thickening. Right apical pleural thickening. Patchy left upper lobe opacities are similar to on the prior exam of 07/24/2015. The right lung is clear. IMPRESSION: Similar appearance of the left upper lobe since 07/24/2015.  Concurrent persistent left suprahilar soft tissue fullness likely related to adenopathy. Please see chest CT results of 06/25/2015. Consider repeat CT. No new airspace opacities identified. Small left pleural effusion. Electronically Signed   By: Abigail Miyamoto M.D.   On: 08/02/2015 17:09   Ct Chest W Contrast  08/02/2015  CLINICAL DATA:  Followup pneumonia. EXAM: CT CHEST WITH CONTRAST TECHNIQUE: Multidetector CT imaging of the chest was performed during intravenous contrast administration. CONTRAST:  122m OMNIPAQUE IOHEXOL 300 MG/ML  SOLN COMPARISON:  Chest x-ray 08/02/2015 and chest CT 06/25/2015 FINDINGS: Mediastinum/Nodes: No breast masses, supraclavicular or axillary lymphadenopathy. The thyroid gland appears normal. The heart is normal in size. No pericardial effusion. The thoracic aorta is normal. No dissection. The branch vessels are patent. Persistent left hilar and suprahilar soft tissue density/adenopathy. Small scattered mediastinal nodes are also noted. An accessory hemi azygos vein is again demonstrated. The  esophagus is grossly normal. Lungs/Pleura: Patchy left upper lobe airspace process. The peripheral more inferior airspace disease is fairly persistent. There is new apical medial lung disease. Stable nodularity in the lateral aspect of the left upper lobe. I do not see a discrete mass or worrisome pulmonary lesion. This is most likely some type of persistent atypical infection. The somewhat changing pattern could suggest E eosinophilic pneumonia. Mild underlying emphysematous changes are noted. No bronchiectasis or interstitial lung disease. There is a small effusion noted with some overlying atelectatic lung. The right lung is clear. Upper abdomen: No significant upper abdominal findings. Musculoskeletal: No significant bony findings. IMPRESSION: Patchy left upper lobe airspace process somewhat changed since the prior examination. This could suggest Re infection or a persistent atypical indolent  infectious process. Eosinophilic pneumonia would be a possibility. Recommend Pulmonary Medicine consultation. Persistent left suprahilar and hilar soft tissue density/adenopathy. Small left pleural effusion with overlying atelectasis. Electronically Signed   By: Marijo Sanes M.D.   On: 08/02/2015 18:39     EKG Interpretation None       7:20 PM I spoke with Dr Elsworth Soho who reviewed patient's CT scan and chart.  He commends close follow up with pulmonology and will send his colleague a message.  He advises that I treat her symptoms but not add any antibiotic or steroids currently.      MDM   Final diagnoses:  Left upper lobe consolidation (HCC)  Pleuritic pain  Gastroesophageal reflux disease, esophagitis presence not specified    Afebrile nontoxic patient with >3 month history of pleuritic pain, bloody sputum, SOB.  Has been treated with z-pak, levaquin, and doxycycline.  Most recent treatment 07/24/15.  Recommended to go to ENT or GI.  She does have severe acid reflux symptoms.  Pt is taking protonix.  Pt not having any increased work of breathing, is not uncomfortable, declines pain medication, O2 saturation mid90s.  Discussed pt with Dr Elsworth Soho, see above.  NO acute issues today.  This is a chronic problem and remains in need of further investigation.  Pt advised to call her pulmonologist tomorrow for close follow up, also referred to GI, Dr Fuller Plan.  D/C home.  Discussed result, findings, treatment, and follow up  with patient.  Pt given return precautions.  Pt verbalizes understanding and agrees with plan.      [place], PA-C 08/02/15 [place], MD 08/03/15 [phone]

## 2015-08-03 ENCOUNTER — Encounter: Payer: Self-pay | Admitting: Pulmonary Disease

## 2015-08-03 DIAGNOSIS — R911 Solitary pulmonary nodule: Secondary | ICD-10-CM

## 2015-08-03 NOTE — Telephone Encounter (Signed)
Per pt advice request: Can you ask Dr. Vaughan Browner if the bronoschope tested for [facility] Fever (Coccidioidomycosis)? I was in [place] a year ago and did breath in the dirt from the [employer] rides in [place], a horseback ride and quad rides in [place]. My cousin is a Animal nutritionist there and she saw my post on Facebook describing my mystery illness and suggested I get checked for this    Please advise Dr. Vaughan Browner

## 2015-08-04 ENCOUNTER — Emergency Department (HOSPITAL_BASED_OUTPATIENT_CLINIC_OR_DEPARTMENT_OTHER)
Admission: EM | Admit: 2015-08-04 | Discharge: 2015-08-04 | Disposition: A | Discharge status: Home / Self Care | Payer: BLUE CROSS/BLUE SHIELD | Attending: Emergency Medicine | Admitting: Emergency Medicine

## 2015-08-04 ENCOUNTER — Encounter (HOSPITAL_BASED_OUTPATIENT_CLINIC_OR_DEPARTMENT_OTHER): Payer: Self-pay

## 2015-08-04 DIAGNOSIS — Z87891 Personal history of nicotine dependence: Secondary | ICD-10-CM | POA: Insufficient documentation

## 2015-08-04 DIAGNOSIS — R05 Cough: Secondary | ICD-10-CM | POA: Insufficient documentation

## 2015-08-04 DIAGNOSIS — Z8701 Personal history of pneumonia (recurrent): Secondary | ICD-10-CM | POA: Insufficient documentation

## 2015-08-04 DIAGNOSIS — R058 Other specified cough: Secondary | ICD-10-CM

## 2015-08-04 DIAGNOSIS — Z79818 Long term (current) use of other agents affecting estrogen receptors and estrogen levels: Secondary | ICD-10-CM | POA: Diagnosis not present

## 2015-08-04 DIAGNOSIS — G8929 Other chronic pain: Secondary | ICD-10-CM | POA: Insufficient documentation

## 2015-08-04 DIAGNOSIS — Z79899 Other long term (current) drug therapy: Secondary | ICD-10-CM | POA: Diagnosis not present

## 2015-08-04 DIAGNOSIS — R519 Headache, unspecified: Secondary | ICD-10-CM

## 2015-08-04 DIAGNOSIS — R51 Headache: Secondary | ICD-10-CM | POA: Insufficient documentation

## 2015-08-04 DIAGNOSIS — E785 Hyperlipidemia, unspecified: Secondary | ICD-10-CM | POA: Insufficient documentation

## 2015-08-04 DIAGNOSIS — Z880 Allergy status to penicillin: Secondary | ICD-10-CM | POA: Diagnosis not present

## 2015-08-04 MED ORDER — KETOROLAC TROMETHAMINE 30 MG/ML IJ SOLN
30.0000 mg | Freq: Once | INTRAMUSCULAR | Status: AC
  Administered 2015-08-04: 30 mg via INTRAMUSCULAR
  Filled 2015-08-04: qty 1

## 2015-08-04 NOTE — ED Notes (Addendum)
Pt c/o HA, stiff joints x 4 days but also states "i have a HA every day"-she looked up s/s of meningitis "and i have them all"-seen here 2 days ago for SOB-states she has unchanged mass in her lung-pt NAD-Steady gait

## 2015-08-04 NOTE — Telephone Encounter (Signed)
Please let her know that the fungal cultures from the bronchoscope are negative. However we can also send additional blood tests to look tor this and other causes of the lung opacities.  Please send Histoplasmosis, blastomycosis and coccidiomycosis serologies. We will also check for ILD panel including ANA, ANCA, Rheumatoid factor, CCP, Sed rate, ESR. CK, Jo-1, Scl 70 and DS DNA.  Please order.

## 2015-08-04 NOTE — ED Notes (Signed)
MD at bedside. 

## 2015-08-04 NOTE — ED Provider Notes (Signed)
CSN: [id number]     Arrival date & time 08/04/15  1806 History  By signing my name below, I, Evelene Croon, attest that this documentation has been prepared under the direction and in the presence of Sharlett Iles, MD . Electronically Signed: Evelene Croon, Scribe. 08/04/2015. 9:34 PM.  Chief Complaint  Patient presents with  . Headache    The history is provided by the patient. No language interpreter was used.     HPI Comments:  Debbie Baker is a 50 y.o. female who presents to the Emergency Department complaining of HA with 5/10 pain. She has been experiencing these HAs daily x a few weeks. She thought they were a side effect of her allergy med but notes this episode started without taking the med. She reports associated subjective fever and mild lightheadedness. Pt denies sore throat, vision change, and weakness in her extremities. No alleviating factors noted. Pt has had 3 courses of antibiotics recently for pneumonia and has been following with a pulmonologist for persistent symptoms. she is not currently on antibiotics. Pt notes cough is still present.  She also endorses stiff joints.   Past Medical History  Diagnosis Date  . Pneumonia   . Hyperlipidemia    Past Surgical History  Procedure Laterality Date  . Video bronchoscopy Bilateral 07/03/2015    Procedure: VIDEO BRONCHOSCOPY WITH FLUORO;  Surgeon: Marshell Garfinkel, MD;  Location: [facility];  Service: Cardiopulmonary;  Laterality: Bilateral;  . Abdominal hysterectomy     Family History  Problem Relation Age of Onset  . Esophageal cancer Father    Social History  Substance Use Topics  . Smoking status: Former Smoker -- 1.50 packs/day for 30 years    Types: Cigarettes    Quit date: 08/02/2010  . Smokeless tobacco: None  . Alcohol Use: 0.0 oz/week    0 Standard drinks or equivalent per week     Comment: 2-3 drinks on the weekends   OB History    No data available     Review of Systems  10 systems reviewed and all  are negative for acute change except as noted in the HPI.  Allergies  Penicillins  Home Medications   Prior to Admission medications   Medication Sig Start Date End Date Taking? Authorizing Provider  benzonatate (TESSALON) 100 MG capsule Take 1 capsule (100 mg total) by mouth at bedtime as needed for cough. 08/02/15   Clayton Bibles, PA-C  cetirizine (ZYRTEC) 10 MG tablet Take 10 mg by mouth daily.    Historical Provider, MD  cholecalciferol (VITAMIN D) 1000 units tablet Take 1,000 Units by mouth daily.    Historical Provider, MD  estrogen-methylTESTOSTERone (ESTRATEST) 1.25-2.5 MG tablet Take 1 tablet by mouth daily.    Historical Provider, MD  fluticasone (FLONASE) 50 MCG/ACT nasal spray Place 2 sprays into both nostrils daily as needed for allergies or rhinitis.    Historical Provider, MD  magnesium oxide (MAG-OX) 400 MG tablet Take 400 mg by mouth daily.    Historical Provider, MD  Multiple Vitamin (MULTIVITAMIN) capsule Take 1 capsule by mouth daily.    Historical Provider, MD  naproxen sodium (ANAPROX) 220 MG tablet Take 220 mg by mouth daily as needed (for pain).    Historical Provider, MD  omega-3 acid ethyl esters (LOVAZA) 1 g capsule Take 2 g by mouth 2 (two) times daily.    Historical Provider, MD  pantoprazole (PROTONIX) 40 MG tablet Take 1 tablet (40 mg total) by mouth 2 (two) times daily.  08/02/15   Clayton Bibles, PA-C  pravastatin (PRAVACHOL) 10 MG tablet Take 10 mg by mouth daily.    Historical Provider, MD  zinc gluconate 50 MG tablet Take 50 mg by mouth daily.    Historical Provider, MD   BP 126/88 mmHg  Pulse 93  Temp(Src) 99.7 F (37.6 C) (Oral)  Resp 16  Ht 5' 8.5" (1.74 m)  Wt 205 lb (92.987 kg)  BMI 30.71 kg/m2  SpO2 95% Physical Exam  Constitutional: She is oriented to person, place, and time. She appears well-developed and well-nourished. No distress.  Awake, alert  HENT:  Head: Normocephalic and atraumatic.  Eyes: Conjunctivae and EOM are normal. Pupils are equal,  round, and reactive to light.  Neck: Neck supple.  Cardiovascular: Normal rate, regular rhythm and normal heart sounds.   No murmur heard. Pulmonary/Chest: Effort normal and breath sounds normal. No respiratory distress.  Intermittent cough  Abdominal: Soft. Bowel sounds are normal. She exhibits no distension.  Musculoskeletal: She exhibits no edema.  Neurological: She is alert and oriented to person, place, and time. She has normal reflexes. No cranial nerve deficit. She exhibits normal muscle tone.  Fluent speech, normal finger-to-nose testing, negative pronator drift 5/5 strength in all extremities  Skin: Skin is warm and dry.  Psychiatric: She has a normal mood and affect. Judgment and thought content normal.  Nursing note and vitals reviewed.   ED Course  Procedures   DIAGNOSTIC STUDIES:  Oxygen Saturation is 97% on RA, normal by my interpretation.    COORDINATION OF CARE:  9:23 PM Discussed treatment plan with pt at bedside and pt agreed to plan. Medications  ketorolac (TORADOL) 30 MG/ML injection 30 mg (30 mg Intramuscular Given 08/04/15 2136)     MDM   Final diagnoses:  Chronic nonintractable headache, unspecified headache type  Cough with sputum   Patient presenting with frequent headaches as well as to the joints in the setting of recent ongoing treatment for persistent pneumonia. She was concerned that she had symptoms of meningitis. On exam, she had intermittent cough but  in no acute distress. Vital signs unremarkable. She had normal range of motion of her neck and no meningismus. She was afebrile. Given the chronicity of her symptoms, I feel meningitis is very unlikely. Patient denies any sudden onset of headache and given her normal neurologic exam, I do not feel she needs any head imaging. Gave the patient Toradol and instructed to follow-up with PCP regarding her headaches as she has been on headache medication in the past. Regarding her chronic cough, the patient  requested testing for [facility] Fever but  I explained that this was a specialized test that would need to be obtained from her specialist. Instructed to follow-up with pulmonologist regarding her symptoms. Patient voiced understanding and was discharged in satisfactory condition.  I personally performed the services described in this documentation, which was scribed in my presence. The recorded information has been reviewed and is accurate.    Sharlett Iles, MD 08/05/15 [phone]

## 2015-08-06 NOTE — Telephone Encounter (Signed)
All labs ordered Waiting on pt to state when she will be able to come for lab draw

## 2015-08-07 ENCOUNTER — Other Ambulatory Visit: Payer: BLUE CROSS/BLUE SHIELD

## 2015-08-07 ENCOUNTER — Ambulatory Visit (INDEPENDENT_AMBULATORY_CARE_PROVIDER_SITE_OTHER): Payer: BLUE CROSS/BLUE SHIELD | Admitting: Adult Health

## 2015-08-07 ENCOUNTER — Other Ambulatory Visit (INDEPENDENT_AMBULATORY_CARE_PROVIDER_SITE_OTHER): Payer: BLUE CROSS/BLUE SHIELD

## 2015-08-07 ENCOUNTER — Encounter: Payer: Self-pay | Admitting: Adult Health

## 2015-08-07 ENCOUNTER — Ambulatory Visit (INDEPENDENT_AMBULATORY_CARE_PROVIDER_SITE_OTHER)
Admission: RE | Admit: 2015-08-07 | Discharge: 2015-08-07 | Disposition: A | Discharge status: Home / Self Care | Payer: BLUE CROSS/BLUE SHIELD | Source: Ambulatory Visit | Attending: Adult Health | Admitting: Adult Health

## 2015-08-07 VITALS — BP 116/86 | HR 74 | Temp 98.1°F | Ht 68.0 in | Wt 202.0 lb

## 2015-08-07 DIAGNOSIS — R042 Hemoptysis: Secondary | ICD-10-CM | POA: Diagnosis not present

## 2015-08-07 DIAGNOSIS — R911 Solitary pulmonary nodule: Secondary | ICD-10-CM | POA: Diagnosis not present

## 2015-08-07 DIAGNOSIS — R918 Other nonspecific abnormal finding of lung field: Secondary | ICD-10-CM

## 2015-08-07 LAB — SEDIMENTATION RATE: SED RATE: 97 mm/h — AB (ref 0–22)

## 2015-08-07 LAB — RHEUMATOID FACTOR: Rhuematoid fact SerPl-aCnc: <10 IU/mL (ref <=14)

## 2015-08-07 LAB — CARDIAC PANEL
CK TOTAL: 72 U/L (ref 7–177)
CK-MB: 1.4 ng/mL (ref 0.3–4.0)
RELATIVE INDEX: 1.9 calc (ref 0.0–2.5)

## 2015-08-07 NOTE — Patient Instructions (Signed)
Mucinex DM Twice daily  As needed  Cough  Call back if coughing up blood worsens or does not improve Labs today . I will call with test results when back .  Please contact office for sooner follow up if symptoms do not improve or worsen or seek emergency care

## 2015-08-07 NOTE — Assessment & Plan Note (Signed)
Persistent LUL consolidation with ongoing hemoptyiss  FOB unrevealing with neg cytology , cx and AFB and Fungal  Check labs with cbc w/ diff , ANA , RA factor , HIV , histo/blast /cocci titers ,HSP panel. And ESR  Hold on abx and steroids for now until labs return.    Plan Mucinex DM Twice daily  As needed  Cough  Call back if coughing up blood worsens or does not improve Labs today . I will call with test results when back .  Please contact office for sooner follow up if symptoms do not improve or worsen or seek emergency care

## 2015-08-07 NOTE — Progress Notes (Signed)
Subjective:    Patient ID: Debbie Baker, female    DOB: 28-Jul-1965, 50 y.o.   MRN: [id number]  HPI 50 yo female Former smoker , seen for pulmonary consult for PNA 06/26/15 .   08/07/2015 Acute OV : PNA/Hemoptysis  Pt returns for persistent hemoptysis.  Pt is quite angry at today's visit. She complains that she has been sick since Thanksgiving and is not getting any better. She has been treated for working dx of CAP that was dx in Dec. She has been treated with 3 different abx including Zithromax, Levaquin and Doxycycline. CXR showed LUL PNA.  Subsequent CT chest  05/2015 LUL CONsolidation . Follow up CXR did not show clearance.  She has had intermittent hemoptysis .  She underwent a FOB on 07/03/15 with cytology neg for malignancy, neg AFB and fungal cx prelim.  Went back to ER on 08/02/15 with hemoptysis , CT chest showed persistent  Patcy LUL consolidation with new apical consolidation.  She does complain that she had Joint pain, fatigue, muscle aches, stiffness in joint esp in hands,  Night sweats. Headaches over last year. Seem to develop after trip to [place] from Feb 2016.  She does admit to [employer] use. NO IV drugs . Some ETOH use. She wants to be tested for possible [facility] fever , coccidiodoycosis .  She denies chest pain, ortrhopnea, edema or fever.     Past Medical History  Diagnosis Date  . Pneumonia   . Hyperlipidemia    Current Outpatient Prescriptions on File Prior to Visit  Medication Sig Dispense Refill  . benzonatate (TESSALON) 100 MG capsule Take 1 capsule (100 mg total) by mouth at bedtime as needed for cough. 15 capsule 0  . cetirizine (ZYRTEC) 10 MG tablet Take 10 mg by mouth daily.    . cholecalciferol (VITAMIN D) 1000 units tablet Take 1,000 Units by mouth daily.    Marland Kitchen estrogen-methylTESTOSTERone (ESTRATEST) 1.25-2.5 MG tablet Take 1 tablet by mouth daily.    . fluticasone (FLONASE) 50 MCG/ACT nasal spray Place 2 sprays into both nostrils daily as needed  for allergies or rhinitis.    . Multiple Vitamin (MULTIVITAMIN) capsule Take 1 capsule by mouth daily.    . naproxen sodium (ANAPROX) 220 MG tablet Take 220 mg by mouth daily as needed (for pain).    Marland Kitchen omega-3 acid ethyl esters (LOVAZA) 1 g capsule Take 2 g by mouth 2 (two) times daily.    . pantoprazole (PROTONIX) 40 MG tablet Take 1 tablet (40 mg total) by mouth 2 (two) times daily. 30 tablet 0  . pravastatin (PRAVACHOL) 10 MG tablet Take 10 mg by mouth daily.    Marland Kitchen zinc gluconate 50 MG tablet Take 50 mg by mouth daily.    . magnesium oxide (MAG-OX) 400 MG tablet Take 400 mg by mouth daily. Reported on 08/07/2015     No current facility-administered medications on file prior to visit.      Review of Systems  Constitutional:   No  weight loss, night sweats,  Fevers, chills,  +fatigue, or  lassitude.  HEENT:   No headaches,  Difficulty swallowing,  Tooth/dental problems, or  Sore throat,                No sneezing, itching, ear ache, nasal congestion, post nasal drip,   CV:  No chest pain,  Orthopnea, PND, swelling in lower extremities, anasarca, dizziness, palpitations, syncope.   GI  No heartburn, indigestion, abdominal pain, nausea, vomiting, diarrhea,  change in bowel habits, loss of appetite, bloody stools.   Resp:    No chest wall deformity  Skin: no rash or lesions.  GU: no dysuria, change in color of urine, no urgency or frequency.  No flank pain, no hematuria   MS:  + joint pain or swelling.  No decreased range of motion.  No back pain.  Psych:  No change in mood or affect. No depression or anxiety.  No memory loss.         Objective:   Physical Exam Filed Vitals:   08/07/15 1212  BP: 116/86  Pulse: 74  Temp: 98.1 F (36.7 C)  TempSrc: Oral  Height: '5\' 8"'$  (1.727 m)  Weight: 202 lb (91.627 kg)  SpO2: 98%   GEN: A/Ox3; pleasant , NAD  HEENT:  [place]/AT,  EACs-clear, TMs-wnl, NOSE-clear, THROAT-clear, no lesions, no postnasal drip or exudate noted. Few dental  caries noted   NECK:  Supple w/ fair ROM; no JVD; normal carotid impulses w/o bruits; no thyromegaly or nodules palpated; no lymphadenopathy.  RESP  Decreased BS in bases no accessory muscle use, no dullness to percussion  CARD:  RRR, no m/r/g  , no peripheral edema, pulses intact, no cyanosis or clubbing.  GI:   Soft & nt; nml bowel sounds; no organomegaly or masses detected.  Musco: Warm bil, no deformities or joint swelling noted.   Neuro: alert, no focal deficits noted.    Skin: Warm, no lesions or rashes   CT chest  08/02/15  Patchy left upper lobe airspace process somewhat changed since the prior examination. This could suggest Re infection or a persistent atypical indolent infectious process. Eosinophilic pneumonia would be a possibility. Recommend Pulmonary Medicine consultation.  Persistent left suprahilar and hilar soft tissue density/adenopathy.  Small left pleural effusion with overlying atelectasis.  Reviewed independently  Myley Bahner NP-C  [facility]  08/07/2015     Assessment & Plan:

## 2015-08-07 NOTE — Telephone Encounter (Signed)
Pt seen today by TP Will sign off

## 2015-08-08 LAB — HIV ANTIBODY (ROUTINE TESTING W REFLEX): HIV: NONREACTIVE

## 2015-08-10 LAB — ANA: ANA: NEGATIVE

## 2015-08-10 LAB — ANTI-SCLERODERMA ANTIBODY: SCLERODERMA (SCL-70) (ENA) ANTIBODY, IGG: NEGATIVE

## 2015-08-10 LAB — C-ANCA TITER: C-ANCA: 1:20 {titer} — ABNORMAL HIGH

## 2015-08-10 LAB — CYCLIC CITRUL PEPTIDE ANTIBODY, IGG: Cyclic Citrullin Peptide Ab: <16 Units

## 2015-08-10 LAB — ANCA SCREEN W REFLEX TITER: ANCA SCREEN: POSITIVE — AB

## 2015-08-10 LAB — ANTI-DNA ANTIBODY, DOUBLE-STRANDED

## 2015-08-12 LAB — OTHER SOLSTAS TEST

## 2015-08-13 LAB — COCCIDIOIDES ANTIBODIES

## 2015-08-14 LAB — MYOSITIS ASSESSR PLUS JO-1 AUTOABS
EJ Autoabs: NOT DETECTED
Jo-1 Autoabs: <1 AI
Ku Autoabs: NOT DETECTED
Mi-2 Autoabs: NOT DETECTED
OJ Autoabs: NOT DETECTED
PL-12 AUTOABS: NOT DETECTED
PL-7 AUTOABS: NOT DETECTED
SRP Autoabs: NOT DETECTED

## 2015-08-14 LAB — BLASTOMYCES AB, ID: Blastomyces Antibody, ID: NEGATIVE

## 2015-08-17 LAB — AFB CULTURE WITH SMEAR (NOT AT ARMC)
Acid Fast Smear: NONE SEEN
SPECIAL REQUESTS: NORMAL

## 2015-08-17 LAB — HISTOPLASMA ANTIBODIES: HISTOPLASMA AB ID: NEGATIVE

## 2015-08-18 ENCOUNTER — Telehealth: Payer: Self-pay | Admitting: Pulmonary Disease

## 2015-08-18 NOTE — Telephone Encounter (Signed)
Spoke with the pt  She is asking for lab results  Please advise thanks

## 2015-08-18 NOTE — Telephone Encounter (Signed)
Histoplasma ab test is neg.  HIV is neg  The Hypersensitivity panel is pending we are waiting for this to come back , called them again today.

## 2015-08-18 NOTE — Telephone Encounter (Signed)
Called and spoke with pt. Reviewed results and recs. Pt voiced understanding and had no further questions.  

## 2015-08-19 LAB — HYPERSENSITIVITY PNUEMONITIS PROFILE

## 2015-08-20 NOTE — Progress Notes (Signed)
Quick Note:  Called and spoke with pt. Reviewed results and recs. Pt voiced understanding and had no further questions. ______ 

## 2015-08-26 NOTE — Telephone Encounter (Signed)
Pt returning call from dr Vaughan Browner and can be reached @ [phone].Debbie Baker

## 2015-08-26 NOTE — Telephone Encounter (Signed)
Nothing found in pt chart regarding call from PM. Forwarding to PM for return call.

## 2015-08-26 NOTE — Telephone Encounter (Signed)
Called Debbie Baker and reviewed all results with the patient. Tests so far were negative except for elevated sed rate and boderline elevation in c-ANCA. She is feeling better after antibiotics with improvement in cough and there is no recurrence of hemoptysis.   I am not convinced that she has Wegners and advised that we will continue to observe for now. Repeat the CT and ANCA in 6 months or earlier if symptoms recur. I will discuss this further in detail at her clinic visit next month. She understands and will call us back with any new symptoms

## 2015-08-31 ENCOUNTER — Encounter: Payer: Self-pay | Admitting: Pulmonary Disease

## 2015-08-31 NOTE — Telephone Encounter (Signed)
Dr Vaughan Browner please advise on patient concern in e-mail attached. Pt is requesting a higher dose of her Protonix or increase in frequency.  Current dose: Protonix '40mg'$  Sig: Take 1 tablet (40 mg total) by mouth 2 (two) times daily.  Thanks.

## 2015-09-03 NOTE — Telephone Encounter (Signed)
She is on the highest dose of protonix. We can add ranitidine 150 mg bid. She may benefit from a GI eval if her GERD is still an issue. We can make the referral if she wishes.

## 2015-09-11 ENCOUNTER — Inpatient Hospital Stay: Admission: RE | Admit: 2015-09-11 | Payer: BLUE CROSS/BLUE SHIELD | Source: Ambulatory Visit

## 2015-09-16 ENCOUNTER — Ambulatory Visit (INDEPENDENT_AMBULATORY_CARE_PROVIDER_SITE_OTHER): Payer: BLUE CROSS/BLUE SHIELD | Admitting: Pulmonary Disease

## 2015-09-16 ENCOUNTER — Encounter: Payer: Self-pay | Admitting: Pulmonary Disease

## 2015-09-16 VITALS — BP 108/68 | HR 56 | Ht 68.0 in | Wt 205.6 lb

## 2015-09-16 DIAGNOSIS — R0600 Dyspnea, unspecified: Secondary | ICD-10-CM

## 2015-09-16 DIAGNOSIS — R918 Other nonspecific abnormal finding of lung field: Secondary | ICD-10-CM | POA: Diagnosis not present

## 2015-09-16 DIAGNOSIS — R042 Hemoptysis: Secondary | ICD-10-CM

## 2015-09-16 NOTE — Progress Notes (Signed)
Subjective:    Patient ID: Debbie Baker, female    DOB: Aug 28, 1965, 50 y.o.   MRN: [id number]  HPI Follow up for abnormal CT scan.  Debbie Baker is a 50 year old with past medical history as below. She's been complaining of cough, sinus congestion since Thanksgiving. She was treated with 3 round of antibiotics before resolution of symptoms. She also produced blood-tinged mucus at times when she clears her throat. It is not particularly associated with cough. No dyspnea, wheezing. She had several CTs done which showed a left upper lobe consolidation. She has some chills but denies any fevers.  She underwent a bronchoscopy on 07/03/15 which did not show any bleeding from lungs. All cultures are negative. Biopsies do not show any evidence of malignancy. Serologies were sent to CTD. All of which were negative except for mild elevation in ANCA. Tests for blasto, histo and Cocci were negative.  In office today she does not have any symptoms related to the lungs. However she has significant issues with GERD and is on PPI and ranitidine dual therapy. She had seen Dr. Paulita Fujita, GI in past and has been referred back to see them.  DATA: WBC count 06/25/15- 11.7 Platelets- 334  CT scan 06/26/15 Images reviewed. Left upper lobe consolidative changes, reactive left hilar adenopathy.  CT scan 08/02/15- Persistence of LUL consolidative changes.   Bronch 07/03/15 Pathology, cytology- No evidence of malignancy. Cultures- Negative to date Cell count- WBC 450 with 7% lymphs, 2% eosinophils, 91% monocyte macrophages.  08/07/15- Negative for Cocci, histo, blasto and HIV. Nergative for ANA, CCP, ds DNA, RA, scl 70. ANCA 1:20  Past medical history: Dysphagia Esophageal reflux Allergic rhinitis Diverticulosis Migraine headache Hyperlipidemia  Social history: 1 pack per day for 30 years. Quit in 2012. Occasional alcohol use no illegal drug use. She is currently not employed. She lives with her boyfriend and 10  cats  Family history: Allergies- Mother Esophageal cancer-father   Current outpatient prescriptions:  .  benzonatate (TESSALON) 100 MG capsule, Take 1 capsule (100 mg total) by mouth at bedtime as needed for cough., Disp: 15 capsule, Rfl: 0 .  calcium-vitamin D (OSCAL WITH D) 500-200 MG-UNIT tablet, Take 1 tablet by mouth every evening., Disp: , Rfl:  .  cetirizine (ZYRTEC) 10 MG tablet, Take 10 mg by mouth daily., Disp: , Rfl:  .  fluticasone (FLONASE) 50 MCG/ACT nasal spray, Place 2 sprays into both nostrils daily as needed for allergies or rhinitis., Disp: , Rfl:  .  magnesium oxide (MAG-OX) 400 MG tablet, Take 400 mg by mouth daily. Reported on 08/07/2015, Disp: , Rfl:  .  Multiple Vitamin (MULTIVITAMIN) capsule, Take 1 capsule by mouth daily., Disp: , Rfl:  .  naproxen sodium (ANAPROX) 220 MG tablet, Take 220 mg by mouth daily as needed (for pain)., Disp: , Rfl:  .  omega-3 acid ethyl esters (LOVAZA) 1 g capsule, Take 2 g by mouth 2 (two) times daily., Disp: , Rfl:  .  OVER THE COUNTER MEDICATION, OTC Jiogulan Root and Tumeric, Disp: , Rfl:  .  pantoprazole (PROTONIX) 40 MG tablet, Take 1 tablet (40 mg total) by mouth 2 (two) times daily., Disp: 30 tablet, Rfl: 0 .  pravastatin (PRAVACHOL) 20 MG tablet, Take 20 mg by mouth every evening., Disp: , Rfl: 3 .  ranitidine (ZANTAC) 150 MG capsule, Take 150 mg by mouth every evening., Disp: , Rfl:  .  zinc gluconate 50 MG tablet, Take 50 mg by mouth daily., Disp: ,  Rfl:    Review of Systems Cough with clear mucus, occasional blood-tinged mucus. Have chills, no fevers. No chest pain, palpitation. No nausea, vomiting, diarrhea, constipation. All other review of systems is negative    Objective:   Physical Exam Blood pressure 108/68, pulse 56, height '5\' 8"'$  (1.727 m), weight 205 lb 9.6 oz (93.26 kg), SpO2 99 %. Gen: No apparent distress Neuro: No gross focal deficits. Neck: No JVD, lymphadenopathy, thyromegaly. RS: Clear, no wheeze,  crackles CVS: S1-S2 heard, no murmurs rubs gallops. Abdomen: Soft, positive bowel sounds. Extremities: No edema.    Assessment & Plan:  Evaluation for Abnormal CT, hemoptysis.   CT images shows left upper lobe consolidative changes that looks most likely inflammatory, infectious consistent with pneumonia, aspiration. Bronch results and serologies are negative except for mild borderline elevation in ANCA. My suspicion for Kirke Shaggy is low as she has no other symptoms of CTD and her symptoms appear to have resolved now. I discussed the results extensively over phone and in office today. We debated further testing with another biopsy, however we have decided to follow with a repeat CT scan later this year and repeat ANCA. She is hoping that the repeat tests would be negative and is not thrilled about going on therapy for wegners if this is confirmed.   She has significant problems with acid reflux and has noticed heartburn before she develops the symptoms of cough, blood in the mucus. She has dysphagia to food and may have esophageal dysmotility as well. She is scheduled for a follow up with GI.    PLAN: - Follow up with GI - Continue PPI and ranitidine - Follow up with repeat CT scan and ANCA in 6 months.   Marshell Garfinkel MD [facility] Pager [phone] If no answer or after 3pm call: [phone] 09/16/2015, 10:05 AM

## 2015-09-16 NOTE — Patient Instructions (Signed)
We will repeat a CT of chest in 6 months. We will refer you to GI for management of GERD  Please call us back earlier if symptoms of dyspnea, cough worsen.  Return to clinic after CT scan

## 2015-09-17 ENCOUNTER — Ambulatory Visit: Payer: BLUE CROSS/BLUE SHIELD | Admitting: Pulmonary Disease

## 2015-09-25 ENCOUNTER — Other Ambulatory Visit: Payer: Self-pay | Admitting: *Deleted

## 2015-09-25 MED ORDER — PANTOPRAZOLE SODIUM 40 MG PO TBEC: 40.0000 mg | DELAYED_RELEASE_TABLET | Freq: Two times a day (BID) | ORAL | Status: DC

## 2015-10-01 ENCOUNTER — Other Ambulatory Visit: Payer: Self-pay | Admitting: Family Medicine

## 2015-10-01 DIAGNOSIS — J019 Acute sinusitis, unspecified: Secondary | ICD-10-CM

## 2015-10-19 ENCOUNTER — Ambulatory Visit
Admission: RE | Admit: 2015-10-19 | Discharge: 2015-10-19 | Disposition: A | Discharge status: Home / Self Care | Payer: BLUE CROSS/BLUE SHIELD | Source: Ambulatory Visit | Attending: Family Medicine | Admitting: Family Medicine

## 2015-10-19 DIAGNOSIS — J019 Acute sinusitis, unspecified: Secondary | ICD-10-CM

## 2015-10-22 ENCOUNTER — Telehealth: Payer: Self-pay | Admitting: Pulmonary Disease

## 2015-10-22 MED ORDER — PANTOPRAZOLE SODIUM 40 MG PO TBEC: 40.0000 mg | DELAYED_RELEASE_TABLET | Freq: Two times a day (BID) | ORAL | Status: DC

## 2015-10-22 NOTE — Telephone Encounter (Signed)
Spoke with the pt  She states that she is trying to get in to see a new GI doc but is having a hard time getting Dr Paulita Fujita to send records  She is asking for refill on protonix bid until this gets worked out  Will give 57 day supply with no fills  Nothing further needed per pt

## 2015-10-27 ENCOUNTER — Telehealth: Payer: Self-pay | Admitting: Gastroenterology

## 2015-10-27 NOTE — Telephone Encounter (Signed)
Received [facility] records. Patient states that she would like to have all of her doctors under the "[facility]" umbrella. Records placed on Dr. Doyne Keel desk for review.

## 2015-11-05 ENCOUNTER — Encounter: Payer: Self-pay | Admitting: Gastroenterology

## 2015-11-05 NOTE — Telephone Encounter (Signed)
Dr. Armbruster reviewed records and has accepted patient. Ok to schedule OV. Appointment scheduled. ° °

## 2015-12-09 ENCOUNTER — Telehealth: Payer: Self-pay | Admitting: Pulmonary Disease

## 2015-12-09 MED ORDER — PANTOPRAZOLE SODIUM 40 MG PO TBEC: 40.0000 mg | DELAYED_RELEASE_TABLET | Freq: Two times a day (BID) | ORAL | Status: DC

## 2015-12-09 NOTE — Telephone Encounter (Signed)
[id number] x1 

## 2015-12-09 NOTE — Telephone Encounter (Signed)
Pt returning call and can be reached @ [phone].Debbie Baker'

## 2015-12-09 NOTE — Telephone Encounter (Signed)
Called spoke with pt. Aware refill sent in. Nothing further needed.

## 2016-01-21 ENCOUNTER — Encounter: Payer: Self-pay | Admitting: Gastroenterology

## 2016-01-21 ENCOUNTER — Ambulatory Visit (INDEPENDENT_AMBULATORY_CARE_PROVIDER_SITE_OTHER): Payer: No Typology Code available for payment source | Admitting: Gastroenterology

## 2016-01-21 VITALS — Ht 67.75 in | Wt 197.2 lb

## 2016-01-21 DIAGNOSIS — K219 Gastro-esophageal reflux disease without esophagitis: Secondary | ICD-10-CM | POA: Diagnosis not present

## 2016-01-21 DIAGNOSIS — Z800 Family history of malignant neoplasm of digestive organs: Secondary | ICD-10-CM

## 2016-01-21 MED ORDER — RANITIDINE HCL 150 MG PO CAPS: 150.0000 mg | ORAL_CAPSULE | Freq: Two times a day (BID) | ORAL | 5 refills | Status: DC | PRN

## 2016-01-21 MED ORDER — PANTOPRAZOLE SODIUM 40 MG PO TBEC: 40.0000 mg | DELAYED_RELEASE_TABLET | Freq: Every day | ORAL | 11 refills | Status: DC

## 2016-01-21 NOTE — Progress Notes (Signed)
HPI :  50 y/o female with a PMH of GERD, HLD, and migraines, here for a new patient evlauation for GERD.  She has had some pyrosis and regurgitation as her reflux symptoms, which she thinks has included an aspiration pneumonia in the past. She sleeps with her head of bed elevated at night and avoids eating prior to sleeping. She was previously on Zegerid twice daily and then over time did not work too well for her. She has since been switched to pantoprazole '40mg'$  PO BID which has been working for her. On twice daily pantoprazole, she has only rare breakthrough nocturnal symptoms, she thinks twice in 2017, so generally working well for her. Most of her symptoms are nocturnal. No dysphagia at this time, but she has had this in the past in January but since resolved since being on protonix. She is taking zantac PRN prior to bedtime if she eats something that triggers her symptoms. No nausea or vomiting. No weight loss, she is trying to lose weight. She has 30 pack year history of tobacco and quit 6 years ago, .  Father had esophageal cancer, diagnosed at age 82. He had a significant tobacco history. She had an EGD in 2010 and it was normal at the time without Barrett's.   No history of renal disease. She has had a DEXA scan in the past, taking calcium. She is not aware of any history of osteopena.   Records received from [facility] Colonoscopy Jan 2015 - no polyps, sigmoid diverticulosis EGD 2010 normal -   Past Medical History:  Diagnosis Date  . Allergic rhinitis   . Diverticulosis   . GERD (gastroesophageal reflux disease)   . Hyperlipidemia   . Migraine   . Pneumonia      Past Surgical History:  Procedure Laterality Date  . ABDOMINAL HYSTERECTOMY    . VIDEO BRONCHOSCOPY Bilateral 07/03/2015   Procedure: VIDEO BRONCHOSCOPY WITH FLUORO;  Surgeon: Marshell Garfinkel, MD;  Location: [facility];  Service: Cardiopulmonary;  Laterality: Bilateral;  . WRIST FRACTURE SURGERY     Family History    Problem Relation Age of Onset  . Esophageal cancer Father    Social History  Substance Use Topics  . Smoking status: Former Smoker    Packs/day: 1.50    Years: 30.00    Types: Cigarettes    Quit date: 08/02/2010  . Smokeless tobacco: Never Used  . Alcohol use 0.0 oz/week     Comment: 2-3 drinks on the weekends   Current Outpatient Prescriptions  Medication Sig Dispense Refill  . benzonatate (TESSALON) 100 MG capsule Take 1 capsule (100 mg total) by mouth at bedtime as needed for cough. 15 capsule 0  . Black Cohosh (BLACK COHOSH HOT FLASH RELIEF) 40 MG CAPS Take 1 capsule by mouth daily.    . calcium-vitamin D (OSCAL WITH D) 500-200 MG-UNIT tablet Take 1 tablet by mouth every evening.    . cetirizine (ZYRTEC) 10 MG tablet Take 10 mg by mouth daily.    . fluticasone (FLONASE) 50 MCG/ACT nasal spray Place 2 sprays into both nostrils daily as needed for allergies or rhinitis.    . magnesium oxide (MAG-OX) 400 MG tablet Take 400 mg by mouth daily. Reported on 08/07/2015    . Multiple Vitamin (MULTIVITAMIN) capsule Take 1 capsule by mouth daily.    . naproxen sodium (ANAPROX) 220 MG tablet Take 220 mg by mouth daily as needed (for pain).    Marland Kitchen omega-3 acid ethyl esters (LOVAZA) 1 g  capsule Take 2 g by mouth 2 (two) times daily.    Marland Kitchen OVER THE COUNTER MEDICATION OTC Jiogulan Root and Tumeric    . pantoprazole (PROTONIX) 40 MG tablet Take 1 tablet (40 mg total) by mouth 2 (two) times daily. 180 tablet 0  . pravastatin (PRAVACHOL) 20 MG tablet Take 20 mg by mouth every evening.  3  . ranitidine (ZANTAC) 150 MG capsule Take 150 mg by mouth 3 times/day as needed-between meals & bedtime.     Marland Kitchen zinc gluconate 50 MG tablet Take 50 mg by mouth daily.     No current facility-administered medications for this visit.    Allergies  Allergen Reactions  . Penicillins Anaphylaxis and Rash    Has patient had a PCN reaction causing immediate rash, facial/tongue/throat swelling, SOB or lightheadedness with  hypotension: Yes Has patient had a PCN reaction causing severe rash involving mucus membranes or skin necrosis: No Has patient had a PCN reaction that required hospitalization No Has patient had a PCN reaction occurring within the last 10 years: Yes If all of the above answers are "NO", then may proceed with Cephalosporin use.      Review of Systems: All systems reviewed and negative except where noted in HPI.   Lab Results  Component Value Date   WBC 10.4 08/02/2015   HGB 13.7 08/02/2015   HCT 40.9 08/02/2015   MCV 95.6 08/02/2015   PLT 330 08/02/2015    Lab Results  Component Value Date   CREATININE 0.79 08/02/2015   BUN 9 08/02/2015   NA 137 08/02/2015   K 3.7 08/02/2015   CL 103 08/02/2015   CO2 24 08/02/2015    No results found for: ALT, AST, GGT, ALKPHOS, BILITOT    Physical Exam: Ht 5' 7.75" (1.721 m) Comment: without shoes  Wt 197 lb 3.2 oz (89.4 kg)   BMI 30.21 kg/m  Constitutional: Pleasant,well-developed, female in no acute distress. HEENT: Normocephalic and atraumatic. Conjunctivae are normal. No scleral icterus. Neck supple.  Cardiovascular: Normal rate, regular rhythm.  Pulmonary/chest: Effort normal and breath sounds normal. No wheezing, rales or rhonchi. Abdominal: Soft, nondistended, nontender. Bowel sounds active throughout. There are no masses palpable.  Extremities: no edema Lymphadenopathy: No cervical adenopathy noted. Neurological: Alert and oriented to person place and time. Skin: Skin is warm and dry. No rashes noted. Psychiatric: Normal mood and affect. Behavior is normal.   ASSESSMENT AND PLAN: 50 y/o female with longstanding GERD. Previously on Zegerid and lost response and previously not doing well, but now on high dose protonix and generally doing pretty well. I discussed risks / benefits of long term PPI use, and recommend she use lowest dose needed to control symptoms. Will try protonix '40mg'$  once daily at this time and see how she  does, and she can increase zantac PRN if she has more frequent breakthrough. Given her nocturnal symptoms moving forward we could consider baclofen PRN. If she has worsening symptoms she should contact us, especially in light of possible reported aspiration.   She otherwise does have a FH of esophageal cancer. She's had a prior EGD without Barrett's in 2010 (in her 56s). She denies any dysphagia at this time since being on protonix. I discussed BE screening with her. She has FH of esophageal cancer, history of tobacco use, and is overweight. She's also had a prior endoscopy without BE. Moving forward I recommended an endoscopy now that she is over 55 years old for screening for BE again, or if  she has refractory symptoms to PPI or any dysphagia, etc. I discussed the procedure with her. She wishes to think about it and wanted to hold off for now given she is doing well but will see me in 6 months for reassessment and discuss it again.   Clarktown Cellar, MD [facility] Pager [phone]  CC: Hulan Fess, MD

## 2016-01-21 NOTE — Patient Instructions (Addendum)
We have sent the following medications to your pharmacy for you to pick up at your convenience: Protonix once daily and Zantac twice daily as needed.  Follow up with Dr. Havery Moros in 6 months.

## 2016-02-28 ENCOUNTER — Other Ambulatory Visit: Payer: Self-pay | Admitting: Pulmonary Disease

## 2016-03-10 ENCOUNTER — Other Ambulatory Visit: Payer: Self-pay | Admitting: Pulmonary Disease

## 2016-03-10 DIAGNOSIS — J329 Chronic sinusitis, unspecified: Secondary | ICD-10-CM

## 2016-03-11 ENCOUNTER — Other Ambulatory Visit (HOSPITAL_BASED_OUTPATIENT_CLINIC_OR_DEPARTMENT_OTHER): Payer: Self-pay | Admitting: Otolaryngology

## 2016-03-11 DIAGNOSIS — J329 Chronic sinusitis, unspecified: Secondary | ICD-10-CM

## 2016-03-21 ENCOUNTER — Encounter (HOSPITAL_BASED_OUTPATIENT_CLINIC_OR_DEPARTMENT_OTHER): Payer: Self-pay

## 2016-03-21 ENCOUNTER — Ambulatory Visit (HOSPITAL_BASED_OUTPATIENT_CLINIC_OR_DEPARTMENT_OTHER)
Admission: RE | Admit: 2016-03-21 | Discharge: 2016-03-21 | Disposition: A | Discharge status: Home / Self Care | Payer: No Typology Code available for payment source | Source: Ambulatory Visit | Attending: Pulmonary Disease | Admitting: Pulmonary Disease

## 2016-03-21 ENCOUNTER — Ambulatory Visit (HOSPITAL_BASED_OUTPATIENT_CLINIC_OR_DEPARTMENT_OTHER)
Admission: RE | Admit: 2016-03-21 | Discharge: 2016-03-21 | Disposition: A | Discharge status: Home / Self Care | Payer: No Typology Code available for payment source | Source: Ambulatory Visit | Attending: Otolaryngology | Admitting: Otolaryngology

## 2016-03-21 DIAGNOSIS — R918 Other nonspecific abnormal finding of lung field: Secondary | ICD-10-CM | POA: Insufficient documentation

## 2016-03-21 DIAGNOSIS — J329 Chronic sinusitis, unspecified: Secondary | ICD-10-CM | POA: Insufficient documentation

## 2016-03-22 ENCOUNTER — Telehealth: Payer: Self-pay | Admitting: Pulmonary Disease

## 2016-03-22 NOTE — Telephone Encounter (Signed)
Received a call from [facility]. Pt's CT chest is available in Epic.  IMPRESSION: 5.0 x 3.7 cm left perihilar soft tissue mass, suspicious for primary bronchogenic neoplasm. Consider bronchoscopy for tissue confirmation.  1.8 cm AP window node, suspicious for nodal metastasis.  1.2 x 2.4 cm nodular opacity in the lingula, suspicious for pulmonary metastasis versus postobstructive infection.  Additional left upper lobe nodules measuring up to 10 mm are suspicious for metastases.

## 2016-03-23 NOTE — Telephone Encounter (Signed)
I called the patient and reviewed the CT scan. She will likely need another bronchoscopy with EBUS. I will discuss further with her on return visit later this week.

## 2016-03-24 NOTE — Telephone Encounter (Signed)
Agree that L hilar mass and 4L node would be amenable to EBUS. There may be a left-sided endobronchial lesion vs just narrowing. The lingular nodular opacity may be reachable via ENB. We could plan for EBUS + ENB if the patient is agreeable. I called [facility] to try to get superD disk made from 10/23 CT chest, was unable to reach them.   Please try them again to see if we can get disc made. Thanks. I will forward FYI to Dr Vaughan Browner as well

## 2016-03-24 NOTE — Telephone Encounter (Signed)
I called [facility]. They are going to turn pt's CT into a Super D. Caryl Pina is working at our [employer] in [employer] today. She is going to pick up this disc for me today.

## 2016-03-25 ENCOUNTER — Ambulatory Visit (INDEPENDENT_AMBULATORY_CARE_PROVIDER_SITE_OTHER): Payer: No Typology Code available for payment source | Admitting: Pulmonary Disease

## 2016-03-25 ENCOUNTER — Encounter: Payer: Self-pay | Admitting: Pulmonary Disease

## 2016-03-25 VITALS — BP 130/64 | HR 73 | Ht 68.0 in | Wt 196.6 lb

## 2016-03-25 DIAGNOSIS — R0602 Shortness of breath: Secondary | ICD-10-CM | POA: Diagnosis not present

## 2016-03-25 DIAGNOSIS — R911 Solitary pulmonary nodule: Secondary | ICD-10-CM

## 2016-03-25 NOTE — Patient Instructions (Addendum)
We will schedule you for a bronchoscopy, biopsy. Some one will get in touch with you from our office to schedule. We will get a PET CT scan  Return in 2-4 weeks.

## 2016-03-25 NOTE — Telephone Encounter (Signed)
Pt's Super D disc has been received and placed in RB's look at folder.

## 2016-03-25 NOTE — Telephone Encounter (Signed)
ENB/EBUS scheduled'@[facility]'$  MAIN OR 04/06/16'@8'$ :30AM this is a wed you are in office in pm only a PET is scheduled for 04/04/16 also is tjhis date ok if so I will call pt thanks libby

## 2016-03-25 NOTE — Progress Notes (Addendum)
Debbie Baker    [id number]    Jan 04, 1966  Primary Care Physician:LITTLE,KEVIN Debbie Ehlers, MD  Referring Physician: Hulan Fess, MD [place], [place] [address]  Chief complaint:   Follow up for  Abnormal CT scan Hemoptysis  HPI: Ms. Stenglein is a 50 year old with past medical history as below. She's been complaining of cough, sinus congestion since Thanksgiving. She was treated with 3 round of antibiotics before resolution of symptoms. She also produced blood-tinged mucus at times when she clears her throat. It is not particularly associated with cough. No dyspnea, wheezing. She had several CTs done which showed a left upper lobe consolidation. She has some chills but denies any fevers.  She underwent a bronchoscopy on 07/03/15 which did not show any bleeding from lungs. All cultures are negative. Biopsies do not show any evidence of malignancy. Serologies were sent to [facility]. All of which were negative except for mild elevation in ANCA. Tests for blasto, histo and Cocci were negative.  In office today she does not have any symptoms related to the lungs. However she has significant issues with GERD and is on PPI and ranitidine dual therapy. She had seen Dr. Paulita Fujita, GI in past and has been referred back to see them.  Interim History: She has had a follow-up CT scan that shows a new left hilar mass with mediastinal lymph nodes concerning for malignancy. She is doing well otherwise. She denies any cough, sputum production, dyspnea. She does not have any chest pain, palpitation. No fevers, chills. She brought up mucus tinged with blood about 3 weeks ago which she associates with sinus problems, throat clearing.   Outpatient Encounter Prescriptions as of 03/25/2016  Medication Sig  . Black Cohosh (BLACK COHOSH HOT FLASH RELIEF) 40 MG CAPS Take 1 capsule by mouth daily.  . calcium-vitamin D (OSCAL WITH D) 500-200 MG-UNIT tablet Take 1 tablet by mouth every evening.  . cetirizine  (ZYRTEC) 10 MG tablet Take 10 mg by mouth daily.  . fluticasone (FLONASE) 50 MCG/ACT nasal spray Place 2 sprays into both nostrils daily as needed for allergies or rhinitis.  . magnesium oxide (MAG-OX) 400 MG tablet Take 400 mg by mouth daily. Reported on 08/07/2015  . Multiple Vitamin (MULTIVITAMIN) capsule Take 1 capsule by mouth daily.  . naproxen sodium (ANAPROX) 220 MG tablet Take 220 mg by mouth daily as needed (for pain).  Marland Kitchen omega-3 acid ethyl esters (LOVAZA) 1 g capsule Take 2 g by mouth 2 (two) times daily.  Marland Kitchen OVER THE COUNTER MEDICATION OTC Jiogulan Root and Tumeric  . pantoprazole (PROTONIX) 40 MG tablet Take 1 tablet (40 mg total) by mouth daily.  . pravastatin (PRAVACHOL) 20 MG tablet Take 20 mg by mouth every evening.  . ranitidine (ZANTAC) 150 MG capsule Take 1 capsule (150 mg total) by mouth 3 times/day as needed-between meals & bedtime.  Marland Kitchen zinc gluconate 50 MG tablet Take 50 mg by mouth daily.  . [DISCONTINUED] benzonatate (TESSALON) 100 MG capsule Take 1 capsule (100 mg total) by mouth at bedtime as needed for cough.  . [DISCONTINUED] pantoprazole (PROTONIX) 40 MG tablet TAKE 1 TABLET BY MOUTH TWICE A DAY (MAX 30 DAYS ON INS)   No facility-administered encounter medications on file as of 03/25/2016.     Allergies as of 03/25/2016 - Review Complete 03/25/2016  Allergen Reaction Noted  . Penicillins Anaphylaxis and Rash 06/26/2015    Past Medical History:  Diagnosis Date  . Allergic  rhinitis   . Diverticulosis   . GERD (gastroesophageal reflux disease)   . Hyperlipidemia   . Migraine   . Pneumonia     Past Surgical History:  Procedure Laterality Date  . ABDOMINAL HYSTERECTOMY    . VIDEO BRONCHOSCOPY Bilateral 07/03/2015   Procedure: VIDEO BRONCHOSCOPY WITH FLUORO;  Surgeon: Marshell Garfinkel, MD;  Location: [facility];  Service: Cardiopulmonary;  Laterality: Bilateral;  . WRIST FRACTURE SURGERY      Family History  Problem Relation Age of Onset  . Esophageal  cancer Father     Social History   Social History  . Marital status: Divorced    Spouse name: N/A  . Number of children: N/A  . Years of education: N/A   Occupational History  . Not on file.   Social History Main Topics  . Smoking status: Former Smoker    Packs/day: 1.50    Years: 30.00    Types: Cigarettes    Quit date: 08/02/2010  . Smokeless tobacco: Never Used  . Alcohol use 0.0 oz/week     Comment: 2-3 drinks on the weekends  . Drug use: No  . Sexual activity: Yes    Birth control/ protection: Surgical   Other Topics Concern  . Not on file   Social History Narrative   10 cats   Lives with boyfriend   Not employed   Former employee of [employer]   3 children living in [place] of systems: Review of Systems  Constitutional: Negative for fever and chills.  HENT: Negative.   Eyes: Negative for blurred vision.  Respiratory: as per HPI  Cardiovascular: Negative for chest pain and palpitations.  Gastrointestinal: Negative for vomiting, diarrhea, blood per rectum. Genitourinary: Negative for dysuria, urgency, frequency and hematuria.  Musculoskeletal: Negative for myalgias, back pain and joint pain.  Skin: Negative for itching and rash.  Neurological: Negative for dizziness, tremors, focal weakness, seizures and loss of consciousness.  Endo/Heme/Allergies: Negative for environmental allergies.  Psychiatric/Behavioral: Negative for depression, suicidal ideas and hallucinations.  All other systems reviewed and are negative.   Physical Exam: Blood pressure 130/64, pulse 73, height '5\' 8"'$  (1.727 m), weight 196 lb 9.6 oz (89.2 kg), SpO2 96 %. Gen:      No acute distress HEENT:  EOMI, sclera anicteric Neck:     No masses; no thyromegaly Lungs:    Clear to auscultation bilaterally; normal respiratory effort CV:         Regular rate and rhythm; no murmurs Abd:      + bowel sounds; soft, non-tender; no palpable masses, no distension Ext:    No  edema; adequate peripheral perfusion Skin:      Warm and dry; no rash Neuro: alert and oriented x 3 Psych: normal mood and affect  Data Reviewed: WBC count 06/25/15- 11.7 Platelets- 334  Images CT scan 06/26/15- Left upper lobe consolidative changes, reactive left hilar adenopathy. CT scan 08/02/15- Persistence of LUL consolidative changes.   CT scan 03/21/16-left hilar mass with for L lymph adenopathy, lingular consolidation. All images reviewed  Bronch 07/03/15 Pathology, cytology- No evidence of malignancy. Cultures- Negative to date Cell count- WBC 450 with 7% lymphs, 2% eosinophils, 91% monocyte macrophages.  08/07/15- Negative for Cocci, histo, blasto and HIV. Nergative for ANA, CCP, ds DNA, RA, scl 70. ANCA 1:20  Spirometry 03/25/16 FVC 2.86 (72%] FEV1 2.13 (68%) F/F 74%. Mild restrictive disease.  Assessment:  Evaluation for Abnormal CT, hemoptysis. Mrs. Neyer had  been followed over the past year for intermittent hemoptysis, shifting infiltrative changes in the left upper lobe. She underwent a bronchoscopy earlier this year which was negative for malignancy. All her labs and cultures were negative except for mildly elevated ANCA.  Her follow-up CT scan shows now a hilar mass with paratracheal lymphadenopathy that is concerning for malignancy. There is a new lingular opacity that may be postobstructive pneumonia versus malignancy. Interestingly her nodular opacities in the left upper lobe appeared to have resolved. I discussed this with her in the office today and recommended getting another bronchoscopy with EBUS biopsy, nav biopsy. I discussed the risk benefit of the procedure with her and she is agreeable to proceed. We will schedule this for the next available. I'll get spirometry in the office for a baseline assessment of her lung function.  Plan/Recommendations: - Schedule for EBUS, Navigational biopsy - Spirometry.  Marshell Garfinkel MD [facility] Pager [phone] 03/25/2016, 9:19 AM  CC: Hulan Fess, MD

## 2016-03-25 NOTE — Telephone Encounter (Signed)
I spoke with her in office today. She is ok to proceed with the bronchoscopy. Libby please schedule for EBUS with navigational biopsy by Dr. Lamonte Sakai for next available. Thanks

## 2016-03-30 DIAGNOSIS — C3490 Malignant neoplasm of unspecified part of unspecified bronchus or lung: Secondary | ICD-10-CM

## 2016-03-30 HISTORY — DX: Malignant neoplasm of unspecified part of unspecified bronchus or lung: C34.90

## 2016-04-04 ENCOUNTER — Encounter (HOSPITAL_COMMUNITY)
Admission: RE | Admit: 2016-04-04 | Discharge: 2016-04-04 | Disposition: A | Discharge status: Home / Self Care | Payer: No Typology Code available for payment source | Source: Ambulatory Visit | Attending: Pulmonary Disease | Admitting: Pulmonary Disease

## 2016-04-04 DIAGNOSIS — R911 Solitary pulmonary nodule: Secondary | ICD-10-CM | POA: Diagnosis not present

## 2016-04-04 LAB — GLUCOSE, CAPILLARY: Glucose-Capillary: 100 mg/dL — ABNORMAL HIGH (ref 65–99)

## 2016-04-04 MED ORDER — FLUDEOXYGLUCOSE F - 18 (FDG) INJECTION
9.6600 | Freq: Once | INTRAVENOUS | Status: AC | PRN
  Administered 2016-04-04: 9.66 via INTRAVENOUS

## 2016-04-05 ENCOUNTER — Encounter (HOSPITAL_COMMUNITY): Payer: Self-pay | Admitting: *Deleted

## 2016-04-05 NOTE — Progress Notes (Signed)
Pt denies being under the care of a cardiologist. Pt denies having a stress test, echo and cardiac cath. Pt denies having an EKG within the last year. Pt denies having any recent labs.  Pt made aware to stop taking Aspirin, vitamins, fish oil herbal medications such as ELDERBERRY, BLACK COHOSH MENOPAUSE,TURMERIC CURCUMIN, Jiaogulan . Do not take any NSAIDs ie: Ibuprofen, Advil, Naproxen( Anaprox), BC and Goody Powder or any medication containing Aspirin. Pt verbalized understanding of all pre-op instructions.

## 2016-04-05 NOTE — Anesthesia Preprocedure Evaluation (Addendum)
Anesthesia Evaluation  Patient identified by MRN, date of birth, ID band Patient awake    Reviewed: Allergy & Precautions, H&P , NPO status , Patient's Chart, lab work & pertinent test results  History of Anesthesia Complications (+) PONV  Airway Mallampati: I  TM Distance: >3 FB Neck ROM: Full    Dental no notable dental hx. (+) Teeth Intact, Dental Advisory Given   Pulmonary neg pulmonary ROS, former smoker,    Pulmonary exam normal breath sounds clear to auscultation       Cardiovascular negative cardio ROS   Rhythm:Regular Rate:Normal     Neuro/Psych  Headaches, negative psych ROS   GI/Hepatic Neg liver ROS, GERD  Medicated and Controlled,  Endo/Other  negative endocrine ROS  Renal/GU negative Renal ROS  negative genitourinary   Musculoskeletal   Abdominal   Peds  Hematology negative hematology ROS (+)   Anesthesia Other Findings   Reproductive/Obstetrics negative OB ROS                           Anesthesia Physical Anesthesia Plan  ASA: II  Anesthesia Plan: General   Post-op Pain Management:    Induction: Intravenous  Airway Management Planned: Oral ETT  Additional Equipment:   Intra-op Plan:   Post-operative Plan: Extubation in OR  Informed Consent: I have reviewed the patients History and Physical, chart, labs and discussed the procedure including the risks, benefits and alternatives for the proposed anesthesia with the patient or authorized representative who has indicated his/her understanding and acceptance.   Dental advisory given  Plan Discussed with: CRNA  Anesthesia Plan Comments:         Anesthesia Quick Evaluation

## 2016-04-06 ENCOUNTER — Encounter (HOSPITAL_COMMUNITY): Payer: Self-pay | Admitting: *Deleted

## 2016-04-06 ENCOUNTER — Ambulatory Visit (HOSPITAL_COMMUNITY): Payer: No Typology Code available for payment source | Admitting: Anesthesiology

## 2016-04-06 ENCOUNTER — Ambulatory Visit (HOSPITAL_COMMUNITY)
Admission: RE | Admit: 2016-04-06 | Discharge: 2016-04-06 | Disposition: A | Discharge status: Home / Self Care | Payer: No Typology Code available for payment source | Source: Ambulatory Visit | Attending: Emergency Medicine | Admitting: Emergency Medicine

## 2016-04-06 ENCOUNTER — Encounter (HOSPITAL_COMMUNITY)
Admission: RE | Disposition: A | Discharge status: Home / Self Care | Payer: Self-pay | Source: Ambulatory Visit | Attending: Emergency Medicine

## 2016-04-06 DIAGNOSIS — Z90710 Acquired absence of both cervix and uterus: Secondary | ICD-10-CM | POA: Insufficient documentation

## 2016-04-06 DIAGNOSIS — E785 Hyperlipidemia, unspecified: Secondary | ICD-10-CM | POA: Diagnosis not present

## 2016-04-06 DIAGNOSIS — Z87891 Personal history of nicotine dependence: Secondary | ICD-10-CM | POA: Diagnosis not present

## 2016-04-06 DIAGNOSIS — C3402 Malignant neoplasm of left main bronchus: Secondary | ICD-10-CM | POA: Insufficient documentation

## 2016-04-06 DIAGNOSIS — R918 Other nonspecific abnormal finding of lung field: Secondary | ICD-10-CM | POA: Diagnosis present

## 2016-04-06 DIAGNOSIS — K219 Gastro-esophageal reflux disease without esophagitis: Secondary | ICD-10-CM | POA: Diagnosis not present

## 2016-04-06 DIAGNOSIS — Z800 Family history of malignant neoplasm of digestive organs: Secondary | ICD-10-CM | POA: Insufficient documentation

## 2016-04-06 DIAGNOSIS — R590 Localized enlarged lymph nodes: Secondary | ICD-10-CM | POA: Diagnosis not present

## 2016-04-06 DIAGNOSIS — Z880 Allergy status to penicillin: Secondary | ICD-10-CM | POA: Insufficient documentation

## 2016-04-06 DIAGNOSIS — Z8719 Personal history of other diseases of the digestive system: Secondary | ICD-10-CM | POA: Insufficient documentation

## 2016-04-06 HISTORY — DX: Presence of spectacles and contact lenses: Z97.3

## 2016-04-06 HISTORY — DX: Dyspnea, unspecified: R06.00

## 2016-04-06 HISTORY — DX: Unspecified hearing loss, left ear: H91.92

## 2016-04-06 HISTORY — PX: VIDEO BRONCHOSCOPY WITH ENDOBRONCHIAL ULTRASOUND: SHX6177

## 2016-04-06 HISTORY — DX: Other nonspecific abnormal finding of lung field: R91.8

## 2016-04-06 HISTORY — DX: Other specified postprocedural states: Z98.890

## 2016-04-06 HISTORY — DX: Other specified postprocedural states: R11.2

## 2016-04-06 LAB — COMPREHENSIVE METABOLIC PANEL
ALBUMIN: 4 g/dL (ref 3.5–5.0)
ALK PHOS: 70 U/L (ref 38–126)
ALT: 39 U/L (ref 14–54)
ANION GAP: 11 (ref 5–15)
AST: 31 U/L (ref 15–41)
BILIRUBIN TOTAL: 0.7 mg/dL (ref 0.3–1.2)
BUN: 8 mg/dL (ref 6–20)
CALCIUM: 9.3 mg/dL (ref 8.9–10.3)
CO2: 21 mmol/L — ABNORMAL LOW (ref 22–32)
CREATININE: 0.68 mg/dL (ref 0.44–1.00)
Chloride: 107 mmol/L (ref 101–111)
GFR calc Af Amer: >60 mL/min (ref >60)
GFR calc non Af Amer: >60 mL/min (ref >60)
GLUCOSE: 106 mg/dL — AB (ref 65–99)
Potassium: 3.9 mmol/L (ref 3.5–5.1)
SODIUM: 139 mmol/L (ref 135–145)
TOTAL PROTEIN: 6.6 g/dL (ref 6.5–8.1)

## 2016-04-06 LAB — BODY FLUID CELL COUNT WITH DIFFERENTIAL
Eos, Fluid: 0 %
Lymphs, Fluid: 31 %
Monocyte-Macrophage-Serous Fluid: 7 % — ABNORMAL LOW (ref 50–90)
NEUTROPHIL FLUID: 62 % — AB (ref 0–25)
WBC FLUID: 98 uL (ref 0–1000)

## 2016-04-06 LAB — CBC
HEMATOCRIT: 42.6 % (ref 36.0–46.0)
HEMOGLOBIN: 14.1 g/dL (ref 12.0–15.0)
MCH: 31.5 pg (ref 26.0–34.0)
MCHC: 33.1 g/dL (ref 30.0–36.0)
MCV: 95.3 fL (ref 78.0–100.0)
Platelets: 246 10*3/uL (ref 150–400)
RBC: 4.47 MIL/uL (ref 3.87–5.11)
RDW: 12.6 % (ref 11.5–15.5)
WBC: 6.3 10*3/uL (ref 4.0–10.5)

## 2016-04-06 LAB — PROTIME-INR
INR: 0.97
Prothrombin Time: 12.9 seconds (ref 11.4–15.2)

## 2016-04-06 LAB — APTT: aPTT: 33 seconds (ref 24–36)

## 2016-04-06 SURGERY — BRONCHOSCOPY, WITH EBUS
Anesthesia: General | Site: Chest

## 2016-04-06 MED ORDER — FENTANYL CITRATE (PF) 100 MCG/2ML IJ SOLN
INTRAMUSCULAR | Status: DC | PRN
  Administered 2016-04-06: 100 ug via INTRAVENOUS

## 2016-04-06 MED ORDER — 0.9 % SODIUM CHLORIDE (POUR BTL) OPTIME
TOPICAL | Status: DC | PRN
  Administered 2016-04-06: 1000 mL

## 2016-04-06 MED ORDER — ROCURONIUM BROMIDE 100 MG/10ML IV SOLN
INTRAVENOUS | Status: DC | PRN
  Administered 2016-04-06: 50 mg via INTRAVENOUS
  Administered 2016-04-06: 20 mg via INTRAVENOUS

## 2016-04-06 MED ORDER — LIDOCAINE 2% (20 MG/ML) 5 ML SYRINGE
INTRAMUSCULAR | Status: AC
  Filled 2016-04-06: qty 5

## 2016-04-06 MED ORDER — EPINEPHRINE PF 1 MG/ML IJ SOLN
INTRAMUSCULAR | Status: AC
  Filled 2016-04-06: qty 1

## 2016-04-06 MED ORDER — EPHEDRINE SULFATE-NACL 50-0.9 MG/10ML-% IV SOSY
PREFILLED_SYRINGE | INTRAVENOUS | Status: DC | PRN
  Administered 2016-04-06: 10 mg via INTRAVENOUS
  Administered 2016-04-06: 5 mg via INTRAVENOUS

## 2016-04-06 MED ORDER — FENTANYL CITRATE (PF) 100 MCG/2ML IJ SOLN
25.0000 ug | INTRAMUSCULAR | Status: DC | PRN
  Administered 2016-04-06: 50 ug via INTRAVENOUS

## 2016-04-06 MED ORDER — FENTANYL CITRATE (PF) 100 MCG/2ML IJ SOLN
INTRAMUSCULAR | Status: AC
  Filled 2016-04-06: qty 2

## 2016-04-06 MED ORDER — DEXAMETHASONE SODIUM PHOSPHATE 10 MG/ML IJ SOLN
INTRAMUSCULAR | Status: DC | PRN
  Administered 2016-04-06: 10 mg via INTRAVENOUS

## 2016-04-06 MED ORDER — MIDAZOLAM HCL 5 MG/5ML IJ SOLN
INTRAMUSCULAR | Status: DC | PRN
  Administered 2016-04-06: 2 mg via INTRAVENOUS

## 2016-04-06 MED ORDER — PANTOPRAZOLE SODIUM 40 MG PO TBEC: 40.0000 mg | DELAYED_RELEASE_TABLET | Freq: Every evening | ORAL | Status: DC

## 2016-04-06 MED ORDER — PROPOFOL 10 MG/ML IV BOLUS
INTRAVENOUS | Status: DC | PRN
  Administered 2016-04-06: 120 mg via INTRAVENOUS

## 2016-04-06 MED ORDER — ONDANSETRON HCL 4 MG/2ML IJ SOLN
INTRAMUSCULAR | Status: AC
  Filled 2016-04-06: qty 2

## 2016-04-06 MED ORDER — PROPOFOL 10 MG/ML IV BOLUS
INTRAVENOUS | Status: AC
  Filled 2016-04-06: qty 20

## 2016-04-06 MED ORDER — DEXAMETHASONE SODIUM PHOSPHATE 10 MG/ML IJ SOLN
INTRAMUSCULAR | Status: AC
  Filled 2016-04-06: qty 1

## 2016-04-06 MED ORDER — ONDANSETRON HCL 4 MG/2ML IJ SOLN
INTRAMUSCULAR | Status: DC | PRN
  Administered 2016-04-06: 4 mg via INTRAVENOUS

## 2016-04-06 MED ORDER — LACTATED RINGERS IV SOLN
INTRAVENOUS | Status: DC | PRN
  Administered 2016-04-06: 08:00:00 via INTRAVENOUS

## 2016-04-06 MED ORDER — LIDOCAINE HCL (CARDIAC) 20 MG/ML IV SOLN
INTRAVENOUS | Status: DC | PRN
  Administered 2016-04-06: 60 mg via INTRAVENOUS
  Administered 2016-04-06: 40 mg via INTRAVENOUS

## 2016-04-06 MED ORDER — PHENYLEPHRINE HCL 10 MG/ML IJ SOLN
INTRAVENOUS | Status: DC | PRN
  Administered 2016-04-06: 75 ug/min via INTRAVENOUS

## 2016-04-06 MED ORDER — SUGAMMADEX SODIUM 200 MG/2ML IV SOLN
INTRAVENOUS | Status: DC | PRN
  Administered 2016-04-06: 200 mg via INTRAVENOUS

## 2016-04-06 MED ORDER — MIDAZOLAM HCL 2 MG/2ML IJ SOLN
INTRAMUSCULAR | Status: AC
  Filled 2016-04-06: qty 2

## 2016-04-06 MED ORDER — SUGAMMADEX SODIUM 200 MG/2ML IV SOLN
INTRAVENOUS | Status: AC
  Filled 2016-04-06: qty 2

## 2016-04-06 SURGICAL SUPPLY — 45 items
ADAPTER BRONCH F/PENTAX (ADAPTER) ×3 IMPLANT
ADPR BSCP EDG PNTX (ADAPTER) ×2
BRUSH CYTOL CELLEBRITY 1.5X140 (MISCELLANEOUS) ×3 IMPLANT
BRUSH SUPERTRAX BIOPSY (INSTRUMENTS) IMPLANT
BRUSH SUPERTRAX NDL-TIP CYTO (INSTRUMENTS) IMPLANT
CANISTER SUCTION 2500CC (MISCELLANEOUS) ×3 IMPLANT
CHANNEL WORK EXTEND EDGE 180 (KITS) IMPLANT
CHANNEL WORK EXTEND EDGE 45 (KITS) IMPLANT
CHANNEL WORK EXTEND EDGE 90 (KITS) IMPLANT
CONT SPEC 4OZ CLIKSEAL STRL BL (MISCELLANEOUS) ×5 IMPLANT
COVER DOME SNAP 22 D (MISCELLANEOUS) ×5 IMPLANT
COVER TABLE BACK 60X90 (DRAPES) ×3 IMPLANT
FILTER STRAW FLUID ASPIR (MISCELLANEOUS) IMPLANT
FORCEPS BIOP RJ4 1.8 (CUTTING FORCEPS) IMPLANT
FORCEPS BIOP SUPERTRX PREMAR (INSTRUMENTS) IMPLANT
GAUZE SPONGE 4X4 12PLY STRL (GAUZE/BANDAGES/DRESSINGS) ×3 IMPLANT
GLOVE BIO SURGEON STRL SZ7.5 (GLOVE) ×6 IMPLANT
GOWN STRL REUS W/ TWL LRG LVL3 (GOWN DISPOSABLE) ×2 IMPLANT
GOWN STRL REUS W/TWL LRG LVL3 (GOWN DISPOSABLE) ×3
KIT CLEAN ENDO COMPLIANCE (KITS) ×6 IMPLANT
KIT LOCATABLE GUIDE (CANNULA) IMPLANT
KIT MARKER FIDUCIAL DELIVERY (KITS) IMPLANT
KIT PROCEDURE EDGE 180 (KITS) IMPLANT
KIT PROCEDURE EDGE 45 (KITS) IMPLANT
KIT PROCEDURE EDGE 90 (KITS) IMPLANT
KIT ROOM TURNOVER OR (KITS) ×3 IMPLANT
MARKER SKIN DUAL TIP RULER LAB (MISCELLANEOUS) ×3 IMPLANT
NDL BIOPSY TRANSBRONCH 21G (NEEDLE) IMPLANT
NDL EBUS SONO TIP PENTAX (NEEDLE) ×1 IMPLANT
NDL SUPERTRX PREMARK BIOPSY (NEEDLE) IMPLANT
NEEDLE BIOPSY TRANSBRONCH 21G (NEEDLE) IMPLANT
NEEDLE EBUS SONO TIP PENTAX (NEEDLE) ×6 IMPLANT
NEEDLE SUPERTRX PREMARK BIOPSY (NEEDLE) IMPLANT
NS IRRIG 1000ML POUR BTL (IV SOLUTION) ×3 IMPLANT
OIL SILICONE PENTAX (PARTS (SERVICE/REPAIRS)) ×3 IMPLANT
PAD ARMBOARD 7.5X6 YLW CONV (MISCELLANEOUS) ×6 IMPLANT
PATCHES PATIENT (LABEL) ×3 IMPLANT
SYR 20CC LL (SYRINGE) ×3 IMPLANT
SYR 20ML ECCENTRIC (SYRINGE) ×6 IMPLANT
SYR 50ML SLIP (SYRINGE) ×3 IMPLANT
SYR 5ML LUER SLIP (SYRINGE) ×3 IMPLANT
TOWEL OR 17X24 6PK STRL BLUE (TOWEL DISPOSABLE) ×3 IMPLANT
TRAP SPECIMEN MUCOUS 40CC (MISCELLANEOUS) IMPLANT
TUBE CONNECTING 20X1/4 (TUBING) ×6 IMPLANT
WATER STERILE IRR 1000ML POUR (IV SOLUTION) ×3 IMPLANT

## 2016-04-06 NOTE — Transfer of Care (Signed)
Immediate Anesthesia Transfer of Care Note  Patient: Debbie Baker  Procedure(s) Performed: Procedure(s): VIDEO BRONCHOSCOPY WITH ENDOBRONCHIAL ULTRASOUND (N/A)  Patient Location: PACU  Anesthesia Type:General  Level of Consciousness: awake, alert  and oriented  Airway & Oxygen Therapy: Patient Spontanous Breathing and Patient connected to nasal cannula oxygen  Post-op Assessment: Report given to RN, Post -op Vital signs reviewed and stable and Patient moving all extremities  Post vital signs: Reviewed and stable  Last Vitals:  Vitals:   04/06/16 0718 04/06/16 1024  BP: 104/63 134/86  Pulse: 69 95  Resp: 20 14  Temp: 37 C 36.1 C    Last Pain:  Vitals:   04/06/16 0718  TempSrc: Oral      Patients Stated Pain Goal: 6 (16/55/37 4827)  Complications: No apparent anesthesia complications

## 2016-04-06 NOTE — Discharge Instructions (Signed)
Flexible Bronchoscopy, Care After These instructions give you information on caring for yourself after your procedure. Your doctor may also give you more specific instructions. Call your doctor if you have any problems or questions after your procedure. HOME CARE  Do not eat or drink anything for 2 hours after your procedure. If you try to eat or drink before the medicine wears off, food or drink could go into your lungs. You could also burn yourself.  After 2 hours have passed and when you can cough and gag normally, you may eat soft food and drink liquids slowly.  The day after the test, you may eat your normal diet.  You may do your normal activities.  Keep all doctor visits. GET HELP RIGHT AWAY IF:  You get more and more short of breath.  You get light-headed.  You feel like you are going to pass out (faint).  You have chest pain.  You have new problems that worry you.  You cough up more than a little blood.  You cough up more blood than before. MAKE SURE YOU:  Understand these instructions.  Will watch your condition.  Will get help right away if you are not doing well or get worse.   This information is not intended to replace advice given to you by your health care provider. Make sure you discuss any questions you have with your health care provider.   PLEASE CALL OUR OFFICE FOR ANY QUESTIONS OR CONCERNS [phone].   Document Released: 03/13/2009 Document Revised: 05/21/2013 Document Reviewed: 01/18/2013 Elsevier Interactive Patient Education [employer].

## 2016-04-06 NOTE — Anesthesia Procedure Notes (Signed)
Procedure Name: Intubation Date/Time: 04/06/2016 8:35 AM Performed by: Kyung Rudd Pre-anesthesia Checklist: Patient identified, Emergency Drugs available, Suction available and Patient being monitored Patient Re-evaluated:Patient Re-evaluated prior to inductionOxygen Delivery Method: Circle system utilized Preoxygenation: Pre-oxygenation with 100% oxygen Intubation Type: IV induction Ventilation: Mask ventilation without difficulty Laryngoscope Size: Mac and 3 Grade View: Grade II Tube type: Oral Tube size: 8.5 mm Number of attempts: 1 Airway Equipment and Method: Stylet and LTA kit utilized Placement Confirmation: ETT inserted through vocal cords under direct vision and breath sounds checked- equal and bilateral Tube secured with: Tape Dental Injury: Teeth and Oropharynx as per pre-operative assessment

## 2016-04-06 NOTE — Interval H&P Note (Signed)
PCCM Interval Note  50 yo woman followed by Dr Vaughan Browner as above for L hilar mass. PET scan was done on 11/6 and shows hypermetabolism in the hilar lesions as well as in the distal ligular nodule. She reports no new issues. Pros and cons of FOB under general anesthesia reviewed with her. All questions answered.   Vitals:   04/06/16 0718  BP: 104/63  Pulse: 69  Resp: 20  Temp: 98.6 F (37 C)  TempSrc: Oral  SpO2: 99%  Weight: 88.9 kg (196 lb)  Height: '5\' 8"'$  (1.727 m)   Gen: Pleasant, well-nourished, in no distress,  normal affect  ENT: No lesions,  mouth clear,  oropharynx clear, no postnasal drip  Neck: No JVD, no TMG, no carotid bruits  Lungs: No use of accessory muscles, clear without rales or rhonchi  Cardiovascular: RRR, heart sounds normal, no murmur or gallops, no peripheral edema  Musculoskeletal: No deformities, no cyanosis or clubbing  Neuro: alert, non focal  Skin: Warm, no lesions or rashes   PET scan 04/04/16 --  IMPRESSION: 1. 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 2. Ipsilateral AP window nodal metastasis and subcarinal nodal metastasis. 3. Subpleural nodal metastasis along the LEFT oblique fissure. 4. No evidence distant metastatic disease. 5. Uptake within the LEFT and RIGHT ovarian tissue is favored physiologic   Recent Labs Lab 04/06/16 0742  HGB 14.1  HCT 42.6  WBC 6.3  PLT 246    Recent Labs Lab 04/06/16 0742  INR 0.97    Plans:  Will start with L hilar biopsies via EBUS. If no tissue dx from this then will proceed with ENB to lingular nodule.   Baltazar Apo, MD, PhD 04/06/2016, 8:25 AM [facility] [phone] or if no answer [phone]

## 2016-04-06 NOTE — Anesthesia Postprocedure Evaluation (Signed)
Anesthesia Post Note  Patient: Debbie Baker  Procedure(s) Performed: Procedure(s) (LRB): VIDEO BRONCHOSCOPY WITH ENDOBRONCHIAL ULTRASOUND (N/A)  Patient location during evaluation: PACU Anesthesia Type: General Level of consciousness: awake and alert Pain management: pain level controlled Vital Signs Assessment: post-procedure vital signs reviewed and stable Respiratory status: spontaneous breathing, nonlabored ventilation and respiratory function stable Cardiovascular status: blood pressure returned to baseline and stable Postop Assessment: no signs of nausea or vomiting Anesthetic complications: no    Last Vitals:  Vitals:   04/06/16 1100 04/06/16 1105  BP: 104/67   Pulse: 78 79  Resp: 14 (!) 21  Temp:  36.6 C    Last Pain:  Vitals:   04/06/16 1049  TempSrc:   PainSc: 6                  Aram Domzalski,W. EDMOND

## 2016-04-06 NOTE — H&P (View-Only) (Signed)
Debbie Baker    [id number]    [birth year]  Primary Care Physician:LITTLE,KEVIN Debbie Ehlers, MD  Referring Physician: Hulan Fess, MD [place], [place] [address]  Chief complaint:   Follow up for  Abnormal CT scan Hemoptysis  HPI: Debbie Baker is a 50 year old with past medical history as below. She's been complaining of cough, sinus congestion since Thanksgiving. She was treated with 3 round of antibiotics before resolution of symptoms. She also produced blood-tinged mucus at times when she clears her throat. It is not particularly associated with cough. No dyspnea, wheezing. She had several CTs done which showed a left upper lobe consolidation. She has some chills but denies any fevers.  She underwent a bronchoscopy on 07/03/15 which did not show any bleeding from lungs. All cultures are negative. Biopsies do not show any evidence of malignancy. Serologies were sent to [facility]. All of which were negative except for mild elevation in ANCA. Tests for blasto, histo and Cocci were negative.  In office today she does not have any symptoms related to the lungs. However she has significant issues with GERD and is on PPI and ranitidine dual therapy. She had seen Dr. Paulita Fujita, GI in past and has been referred back to see them.  Interim History: She has had a follow-up CT scan that shows a new left hilar mass with mediastinal lymph nodes concerning for malignancy. She is doing well otherwise. She denies any cough, sputum production, dyspnea. She does not have any chest pain, palpitation. No fevers, chills. She brought up mucus tinged with blood about 3 weeks ago which she associates with sinus problems, throat clearing.   Outpatient Encounter Prescriptions as of 03/25/2016  Medication Sig  . Black Cohosh (BLACK COHOSH HOT FLASH RELIEF) 40 MG CAPS Take 1 capsule by mouth daily.  . calcium-vitamin D (OSCAL WITH D) 500-200 MG-UNIT tablet Take 1 tablet by mouth every evening.  . cetirizine  (ZYRTEC) 10 MG tablet Take 10 mg by mouth daily.  . fluticasone (FLONASE) 50 MCG/ACT nasal spray Place 2 sprays into both nostrils daily as needed for allergies or rhinitis.  . magnesium oxide (MAG-OX) 400 MG tablet Take 400 mg by mouth daily. Reported on 08/07/2015  . Multiple Vitamin (MULTIVITAMIN) capsule Take 1 capsule by mouth daily.  . naproxen sodium (ANAPROX) 220 MG tablet Take 220 mg by mouth daily as needed (for pain).  Marland Kitchen omega-3 acid ethyl esters (LOVAZA) 1 g capsule Take 2 g by mouth 2 (two) times daily.  Marland Kitchen OVER THE COUNTER MEDICATION OTC Jiogulan Root and Tumeric  . pantoprazole (PROTONIX) 40 MG tablet Take 1 tablet (40 mg total) by mouth daily.  . pravastatin (PRAVACHOL) 20 MG tablet Take 20 mg by mouth every evening.  . ranitidine (ZANTAC) 150 MG capsule Take 1 capsule (150 mg total) by mouth 3 times/day as needed-between meals & bedtime.  Marland Kitchen zinc gluconate 50 MG tablet Take 50 mg by mouth daily.  . [DISCONTINUED] benzonatate (TESSALON) 100 MG capsule Take 1 capsule (100 mg total) by mouth at bedtime as needed for cough.  . [DISCONTINUED] pantoprazole (PROTONIX) 40 MG tablet TAKE 1 TABLET BY MOUTH TWICE A DAY (MAX 30 DAYS ON INS)   No facility-administered encounter medications on file as of 03/25/2016.     Allergies as of 03/25/2016 - Review Complete 03/25/2016  Allergen Reaction Noted  . Penicillins Anaphylaxis and Rash 06/26/2015    Past Medical History:  Diagnosis Date  . Allergic  rhinitis   . Diverticulosis   . GERD (gastroesophageal reflux disease)   . Hyperlipidemia   . Migraine   . Pneumonia     Past Surgical History:  Procedure Laterality Date  . ABDOMINAL HYSTERECTOMY    . VIDEO BRONCHOSCOPY Bilateral 07/03/2015   Procedure: VIDEO BRONCHOSCOPY WITH FLUORO;  Surgeon: Marshell Garfinkel, MD;  Location: [facility];  Service: Cardiopulmonary;  Laterality: Bilateral;  . WRIST FRACTURE SURGERY      Family History  Problem Relation Age of Onset  . Esophageal  cancer Father     Social History   Social History  . Marital status: Divorced    Spouse name: N/A  . Number of children: N/A  . Years of education: N/A   Occupational History  . Not on file.   Social History Main Topics  . Smoking status: Former Smoker    Packs/day: 1.50    Years: 30.00    Types: Cigarettes    Quit date: 08/02/2010  . Smokeless tobacco: Never Used  . Alcohol use 0.0 oz/week     Comment: 2-3 drinks on the weekends  . Drug use: No  . Sexual activity: Yes    Birth control/ protection: Surgical   Other Topics Concern  . Not on file   Social History Narrative   10 cats   Lives with boyfriend   Not employed   Former employee of [employer]   3 children living in [place] of systems: Review of Systems  Constitutional: Negative for fever and chills.  HENT: Negative.   Eyes: Negative for blurred vision.  Respiratory: as per HPI  Cardiovascular: Negative for chest pain and palpitations.  Gastrointestinal: Negative for vomiting, diarrhea, blood per rectum. Genitourinary: Negative for dysuria, urgency, frequency and hematuria.  Musculoskeletal: Negative for myalgias, back pain and joint pain.  Skin: Negative for itching and rash.  Neurological: Negative for dizziness, tremors, focal weakness, seizures and loss of consciousness.  Endo/Heme/Allergies: Negative for environmental allergies.  Psychiatric/Behavioral: Negative for depression, suicidal ideas and hallucinations.  All other systems reviewed and are negative.   Physical Exam: Blood pressure 130/64, pulse 73, height '5\' 8"'$  (1.727 m), weight 196 lb 9.6 oz (89.2 kg), SpO2 96 %. Gen:      No acute distress HEENT:  EOMI, sclera anicteric Neck:     No masses; no thyromegaly Lungs:    Clear to auscultation bilaterally; normal respiratory effort CV:         Regular rate and rhythm; no murmurs Abd:      + bowel sounds; soft, non-tender; no palpable masses, no distension Ext:    No  edema; adequate peripheral perfusion Skin:      Warm and dry; no rash Neuro: alert and oriented x 3 Psych: normal mood and affect  Data Reviewed: WBC count 06/25/15- 11.7 Platelets- 334  Images CT scan 06/26/15- Left upper lobe consolidative changes, reactive left hilar adenopathy. CT scan 08/02/15- Persistence of LUL consolidative changes.   CT scan 03/21/16-left hilar mass with for L lymph adenopathy, lingular consolidation. All images reviewed  Bronch 07/03/15 Pathology, cytology- No evidence of malignancy. Cultures- Negative to date Cell count- WBC 450 with 7% lymphs, 2% eosinophils, 91% monocyte macrophages.  08/07/15- Negative for Cocci, histo, blasto and HIV. Nergative for ANA, CCP, ds DNA, RA, scl 70. ANCA 1:20  Spirometry 03/25/16 FVC 2.86 (72%] FEV1 2.13 (68%) F/F 74%. Mild restrictive disease.  Assessment:  Evaluation for Abnormal CT, hemoptysis. Debbie Baker had  been followed over the past year for intermittent hemoptysis, shifting infiltrative changes in the left upper lobe. She underwent a bronchoscopy earlier this year which was negative for malignancy. All her labs and cultures were negative except for mildly elevated ANCA.  Her follow-up CT scan shows now a hilar mass with paratracheal lymphadenopathy that is concerning for malignancy. There is a new lingular opacity that may be postobstructive pneumonia versus malignancy. Interestingly her nodular opacities in the left upper lobe appeared to have resolved. I discussed this with her in the office today and recommended getting another bronchoscopy with EBUS biopsy, nav biopsy. I discussed the risk benefit of the procedure with her and she is agreeable to proceed. We will schedule this for the next available. I'll get spirometry in the office for a baseline assessment of her lung function.  Plan/Recommendations: - Schedule for EBUS, Navigational biopsy - Spirometry.  Marshell Garfinkel MD  Pulmonary and Critical  Care Pager [phone] 03/25/2016, 9:19 AM  CC: Hulan Fess, MD

## 2016-04-06 NOTE — Op Note (Signed)
Video Bronchoscopy with Endobronchial Ultrasound Procedure Note  Date of Operation: 04/06/2016  Pre-op Diagnosis: L hilar mass  Post-op Diagnosis: same  Surgeon: Baltazar Apo  Assistants: Marshell Garfinkel  Anesthesia: General endotracheal anesthesia  Operation: Flexible video fiberoptic bronchoscopy with endobronchial ultrasound and biopsies.  Estimated Blood Loss: Minimal  Complications: None apparent  Indications and History: Debbie Baker is a 50 y.o. female with a hx of a lingular nodule and a L hilar mass. Recommendation made to achieve tissue diagnosis via EBUS. The risks, benefits, complications, treatment options and expected outcomes were discussed with the patient.  The possibilities of pneumothorax, pneumonia, reaction to medication, pulmonary aspiration, perforation of a viscus, bleeding, failure to diagnose a condition and creating a complication requiring transfusion or operation were discussed with the patient who freely signed the consent.    Description of Procedure: The patient was examined in the preoperative area and history and data from the preprocedure consultation were reviewed. It was deemed appropriate to proceed.  The patient was taken to OR10, identified as [name] and the procedure verified as Flexible Video Fiberoptic Bronchoscopy.  A Time Out was held and the above information confirmed. After being taken to the operating room general anesthesia was initiated and the patient  was orally intubated. The video fiberoptic bronchoscope was introduced via the endotracheal tube and a general inspection was performed which showed some thickening and protrusion of the superior bronchial wall of the LUL oriface, otherwise normal anatomy. The standard scope was then withdrawn and the endobronchial ultrasound was used to identify and characterize the peritracheal, hilar and bronchial lymph nodes. Inspection showed a L hilar mass at the oriface of the LUL bronchus,  enlargement of station 7 and 4L nodes. Using real-time ultrasound guidance Wang needle biopsies were take from the L hilar mass, Station 4L and 7 nodes and were sent for cytology. A BAL was performed in the lingula for culture data. The patient tolerated the procedure well without apparent complications. There was no significant blood loss. The bronchoscope was withdrawn. Anesthesia was reversed and the patient was taken to the PACU for recovery.   Samples: 1. Wang needle biopsies from L hilar mass 2. Wang needle biopsies from 4L node 3. Wang needle biopsies from 7 node 4. BAL from the lingula  Plans:  The patient will be discharged from the PACU to home when recovered from anesthesia. We will review the cytology, pathology and microbiology results with the patient when they become available. Outpatient followup will be with Dr Vaughan Browner.   Baltazar Apo, MD, PhD 04/06/2016, 10:28 AM [facility] [phone] or if no answer [phone]

## 2016-04-07 ENCOUNTER — Encounter (HOSPITAL_COMMUNITY): Payer: Self-pay | Admitting: Emergency Medicine

## 2016-04-07 LAB — ACID FAST SMEAR (AFB)
ACID FAST SMEAR - AFSCU2: NEGATIVE
ACID FAST SMEAR - AFSCU2: NEGATIVE

## 2016-04-07 LAB — ACID FAST SMEAR (AFB, MYCOBACTERIA)

## 2016-04-08 ENCOUNTER — Telehealth: Payer: Self-pay | Admitting: Emergency Medicine

## 2016-04-08 DIAGNOSIS — C3490 Malignant neoplasm of unspecified part of unspecified bronchus or lung: Secondary | ICD-10-CM

## 2016-04-08 LAB — AEROBIC CULTURE W GRAM STAIN (SUPERFICIAL SPECIMEN)
Culture: NO GROWTH
Special Requests: 4

## 2016-04-08 LAB — PATHOLOGIST SMEAR REVIEW

## 2016-04-08 LAB — AEROBIC CULTURE  (SUPERFICIAL SPECIMEN)

## 2016-04-08 LAB — CULTURE, RESPIRATORY W GRAM STAIN

## 2016-04-08 LAB — CULTURE, RESPIRATORY: CULTURE: NO GROWTH

## 2016-04-08 NOTE — Telephone Encounter (Signed)
Discussed the results with the patient. Shows non-small cell lung cancer, probably adenocarcinoma. I will refer her to the thoracic oncology clinic at [facility]. Will also request that molecular studies be done

## 2016-04-11 ENCOUNTER — Telehealth: Payer: Self-pay | Admitting: Pulmonary Disease

## 2016-04-11 NOTE — Telephone Encounter (Signed)
Spoke with pt.  She has not heard anything regarding oncology appt.  I advised her of the notes that were made by oncology under the referral.  I told pt we would call oncology in the morning to have them clarify appt with her. Pt verbalized understanding.

## 2016-04-11 NOTE — Telephone Encounter (Signed)
Spoke with pt. She thinks this is an old message. Nothing further was needed.

## 2016-04-12 ENCOUNTER — Telehealth: Payer: Self-pay | Admitting: Internal Medicine

## 2016-04-12 NOTE — Telephone Encounter (Signed)
Spoke with one of the schedulers at the cancer center. She did state that the pt. Does have an appointment on the 20th with Dr. Earlie Server. They did state they was going to call her right away.   Did speak to pt. Who stated she will call them back because she did have a miss call from them. I told her to call us back if she needed anything else. Nothing further is needed at this time.

## 2016-04-12 NOTE — Telephone Encounter (Signed)
Received a tc to [facility] regarding pt's appt. Let them know an appt had been scheduled for the pt to see [facility] on 11/20 at 215pm. Tc to the pt to make her aware of the appt. She first stated that she can't do Monday appt and can only do Thurs and Fri. I explained that the MD isn't in the office on Friday. Told her I will consult w/the nurse navigator to check appts for her specfied days. Spoke to [place]. She stated MD doesn't have any NP appts for the days the pt wants. Cld the pt and let her know that the next  Available is 12/6. Pt agreed to keep 11/20 appt.

## 2016-04-14 ENCOUNTER — Telehealth: Payer: Self-pay | Admitting: Pulmonary Disease

## 2016-04-14 MED ORDER — BENZONATATE 100 MG PO CAPS: 100.0000 mg | ORAL_CAPSULE | Freq: Four times a day (QID) | ORAL | 1 refills | Status: DC | PRN

## 2016-04-14 NOTE — Telephone Encounter (Signed)
Called and spoke with pt and she stated that she had the bronchoscopy last week and she has not been able to stop coughing.  She stated that with the pleurisy, this makes her side hurt worse.  She is requesting that a refill of the tessalon perles be called into her pharmacy.  PM is off today.  CY please advise. Thanks  Allergies  Allergen Reactions  . Penicillins Anaphylaxis and Rash     Has patient had a PCN reaction causing immediate rash, facial/tongue/throat swelling, SOB or lightheadedness with hypotension: # # YES # # Has patient had a PCN reaction causing severe rash involving mucus membranes or skin necrosis: No Has patient had a PCN reaction that required hospitalization No Has patient had a PCN reaction occurring within the last 10 years: # # YES # #  If all of the above answers are "NO", then may proceed with Cephalosporin use.   . Chantix [Varenicline] Other (See Comments)    Nightmares/lack of sleep  . Levaquin [Levofloxacin] Other (See Comments)    Muscle weakness/difficulty walking

## 2016-04-14 NOTE — Telephone Encounter (Signed)
Ok to refill tessalon perles  Ok to supplement by also taking Mucinex-DM or Delsym cough syrup otc if she wants.

## 2016-04-14 NOTE — Telephone Encounter (Signed)
Pt aware of CY recommendations. Rx has been sent to preferred pharmacy. Pt aware & voiced understanding. Nothing further needed.

## 2016-04-18 ENCOUNTER — Other Ambulatory Visit (HOSPITAL_BASED_OUTPATIENT_CLINIC_OR_DEPARTMENT_OTHER): Payer: No Typology Code available for payment source

## 2016-04-18 ENCOUNTER — Encounter: Payer: Self-pay | Admitting: *Deleted

## 2016-04-18 ENCOUNTER — Other Ambulatory Visit: Payer: Self-pay | Admitting: Medical Oncology

## 2016-04-18 ENCOUNTER — Ambulatory Visit (HOSPITAL_BASED_OUTPATIENT_CLINIC_OR_DEPARTMENT_OTHER): Payer: No Typology Code available for payment source | Admitting: Internal Medicine

## 2016-04-18 ENCOUNTER — Telehealth: Payer: Self-pay | Admitting: Internal Medicine

## 2016-04-18 ENCOUNTER — Encounter: Payer: Self-pay | Admitting: Internal Medicine

## 2016-04-18 ENCOUNTER — Ambulatory Visit: Payer: No Typology Code available for payment source

## 2016-04-18 DIAGNOSIS — Z5111 Encounter for antineoplastic chemotherapy: Secondary | ICD-10-CM

## 2016-04-18 DIAGNOSIS — C3492 Malignant neoplasm of unspecified part of left bronchus or lung: Secondary | ICD-10-CM | POA: Diagnosis not present

## 2016-04-18 DIAGNOSIS — R590 Localized enlarged lymph nodes: Secondary | ICD-10-CM

## 2016-04-18 HISTORY — DX: Encounter for antineoplastic chemotherapy: Z51.11

## 2016-04-18 LAB — COMPREHENSIVE METABOLIC PANEL
ALT: 45 U/L (ref 0–55)
AST: 26 U/L (ref 5–34)
Albumin: 3.7 g/dL (ref 3.5–5.0)
Alkaline Phosphatase: 97 U/L (ref 40–150)
Anion Gap: 9 mEq/L (ref 3–11)
BILIRUBIN TOTAL: 0.49 mg/dL (ref 0.20–1.20)
BUN: 9.4 mg/dL (ref 7.0–26.0)
CO2: 25 meq/L (ref 22–29)
CREATININE: 0.7 mg/dL (ref 0.6–1.1)
Calcium: 9.6 mg/dL (ref 8.4–10.4)
Chloride: 105 mEq/L (ref 98–109)
EGFR: >90 mL/min/{1.73_m2} (ref >90)
GLUCOSE: 85 mg/dL (ref 70–140)
Potassium: 3.9 mEq/L (ref 3.5–5.1)
SODIUM: 139 meq/L (ref 136–145)
TOTAL PROTEIN: 7.3 g/dL (ref 6.4–8.3)

## 2016-04-18 LAB — CBC WITH DIFFERENTIAL/PLATELET
BASO%: 1 % (ref 0.0–2.0)
Basophils Absolute: 0.1 10*3/uL (ref 0.0–0.1)
EOS%: 2.3 % (ref 0.0–7.0)
Eosinophils Absolute: 0.2 10*3/uL (ref 0.0–0.5)
HCT: 44 % (ref 34.8–46.6)
HGB: 14.6 g/dL (ref 11.6–15.9)
LYMPH%: 29.7 % (ref 14.0–49.7)
MCH: 31.6 pg (ref 25.1–34.0)
MCHC: 33.1 g/dL (ref 31.5–36.0)
MCV: 95.2 fL (ref 79.5–101.0)
MONO#: 0.5 10*3/uL (ref 0.1–0.9)
MONO%: 6 % (ref 0.0–14.0)
NEUT%: 61 % (ref 38.4–76.8)
NEUTROS ABS: 5.1 10*3/uL (ref 1.5–6.5)
PLATELETS: 268 10*3/uL (ref 145–400)
RBC: 4.62 10*6/uL (ref 3.70–5.45)
RDW: 12.8 % (ref 11.2–14.5)
WBC: 8.3 10*3/uL (ref 3.9–10.3)
lymph#: 2.5 10*3/uL (ref 0.9–3.3)

## 2016-04-18 MED ORDER — PROCHLORPERAZINE MALEATE 10 MG PO TABS: 10.0000 mg | ORAL_TABLET | Freq: Four times a day (QID) | ORAL | 0 refills | Status: DC | PRN

## 2016-04-18 NOTE — Progress Notes (Signed)
START ON PATHWAY REGIMEN - Non-Small Cell Lung  [id number]: Carboplatin AUC=2 + Paclitaxel 45 mg/m2 Weekly During Radiation   Administer weekly:     Paclitaxel (Taxol(R)) 45 mg/m2 in 250 mL NS IV over 1 hour followed by Dose Mod: None     Carboplatin (Paraplatin(R)) AUC=2 in 100 mL NS IV over 30 minutes Dose Mod: None Additional Orders: * All AUC calculations intended to be used in [employer] formula  **Always confirm dose/schedule in your pharmacy ordering system**    Patient Characteristics: Stage III - Unresectable, PS = 0, 1 Check here if patient was staged using an edition prior to AJCC Staging - 8th Edition (i.e., prior to May 30, 2016)? false AJCC T Category: T2b Current Disease Status: No Distant Mets or Local Recurrence AJCC N Category: N2 AJCC M Category: M0 AJCC 8 Stage Grouping: IIIA Performance Status: PS = 0, 1  Intent of Therapy: Curative Intent, Discussed with Patient

## 2016-04-18 NOTE — Progress Notes (Signed)
[place] [phone]   [phone]  CONSULT NOTE  REFERRING PHYSICIAN: Dr. Baltazar Apo  REASON FOR CONSULTATION:  50 years old white female recently diagnosed with lung cancer  HPI Debbie Baker is a 50 y.o. female with past medical history significant for GERD, diverticulosis, dyslipidemia, migraine headache, recurrent pneumonia as well as long history of smoking but quit 6 years ago. The patient mentioned that since Thanksgiving of last year 2016 she has been complaining of cough and sinus congestion. She was treated for pneumonia with 3 courses of antibiotics. On 06/25/2015 she had CT angiogram of the chest that showed nodular consolidation and segmental airspace disease in the left upper lobe most suggestive of bronchopneumonia. On 04/05/2016 she had bronchoscopy performed that was negative for malignancy or infectious process. A repeat CT scan of the chest on 08/02/2015 showed patchy left upper lobe airspace disease. There was also persistent left suprahilar and hilar soft tissue density/adenopathy. The patient was followed by observation but repeat CT scan of the chest on 03/21/2016 showed 5.0 x 3.7 cm left perihilar soft tissue mass suspicious for primary bronchogenic neoplasm. There was also 1.8 cm AP window node suspicious for nodal metastasis and 1.2 x 2.4 cm nodular opacity in the lingula suspicious for pulmonary metastasis versus postobstructive infection. There was an additional left upper lobe nodules measuring up to 1.0 cm suspicious for metastasis. A PET scan on 04/04/2016 showed hypermetabolic left suprahilar mass consistent with primary bronchogenic carcinoma. There was ipsilateral AP window nodal metastasis and subcarinal nodal metastasis. There was also subpleural nodal metastasis along the left oblique fissure and no evidence of distant metastatic disease. On 04/06/2016 the patient underwent flexible video fiberoptic bronchoscopy with  endobronchial ultrasound and biopsies under the care of Dr. Lamonte Sakai and the final cytology of the 4L lymph node ([id number]) was consistent with non-small cell lung cancer favoring adenocarcinoma. There was insufficient material for additional studies. Dr. Lamonte Sakai kindly referred the patient to me today for further evaluation and recommendation regarding treatment of her condition. When seen today the patient continues to complain of shortness breath and cough. She also has pain in the left side of the chest but no hemoptysis. She denied having any significant weight loss or night sweats. She has occasional headache but no visual changes. She has no nausea, vomiting, diarrhea or constipation. Family history significant for father with esophageal cancer and mother recently diagnosed with breast cancer. The patient is divorced and has 3 sons in [place]. 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 She has a history of smoking 1 pack per day for around 30 years but quit 6 years ago. She also drinks 1-2 alcoholic drinks every night and she also smokes marijuana.   HPI  Past Medical History:  Diagnosis Date  . Allergic rhinitis   . Deaf, left   . Diverticulosis   . Dyspnea   . Encounter for antineoplastic chemotherapy 04/18/2016  . GERD (gastroesophageal reflux disease)   . Hyperlipidemia   . Lung nodules   . Migraine   . Pneumonia   . PONV (postoperative nausea and vomiting)   . Wears glasses     Past Surgical History:  Procedure Laterality Date  . ABDOMINAL HYSTERECTOMY    . VIDEO BRONCHOSCOPY Bilateral 07/03/2015   Procedure: VIDEO BRONCHOSCOPY WITH FLUORO;  Surgeon: Marshell Garfinkel, MD;  Location: [place];  Service: Cardiopulmonary;  Laterality: Bilateral;  . VIDEO BRONCHOSCOPY WITH ENDOBRONCHIAL ULTRASOUND N/A 04/06/2016  Procedure: VIDEO BRONCHOSCOPY WITH ENDOBRONCHIAL ULTRASOUND;  Surgeon: Collene Gobble, MD;  Location: [facility] OR;  Service: Thoracic;  Laterality:  N/A;  . WRIST FRACTURE SURGERY      Family History  Problem Relation Age of Onset  . Esophageal cancer Father   . Breast cancer Mother     Social History Social History  Substance Use Topics  . Smoking status: Former Smoker    Packs/day: 1.50    Years: 30.00    Types: Cigarettes    Quit date: 08/02/2010  . Smokeless tobacco: Never Used  . Alcohol use 0.0 oz/week     Comment: 1-2 drinks daily    Allergies  Allergen Reactions  . Penicillins Anaphylaxis and Rash     Has patient had a PCN reaction causing immediate rash, facial/tongue/throat swelling, SOB or lightheadedness with hypotension: # # YES # # Has patient had a PCN reaction causing severe rash involving mucus membranes or skin necrosis: No Has patient had a PCN reaction that required hospitalization No Has patient had a PCN reaction occurring within the last 10 years: # # YES # #  If all of the above answers are "NO", then may proceed with Cephalosporin use.   . Chantix [Varenicline] Other (See Comments)    Nightmares/lack of sleep  . Levaquin [Levofloxacin] Other (See Comments)    Muscle weakness/difficulty walking    Current Outpatient Prescriptions  Medication Sig Dispense Refill  . benzonatate (TESSALON) 100 MG capsule Take 1 capsule (100 mg total) by mouth every 6 (six) hours as needed for cough. 30 capsule 1  . Calcium-Magnesium-Vitamin D 185-50-100 MG-MG-UNIT CAPS Take 1 tablet by mouth every evening.    . cetirizine (ZYRTEC) 10 MG tablet Take 10 mg by mouth daily.    Marland Kitchen ELDERBERRY PO Take 2 tablets by mouth daily. Chewable Supplement    . Misc Natural Products (BLACK COHOSH MENOPAUSE COMPLEX) TABS Take 1 capsule by mouth 2 (two) times daily.    . Misc Natural Products (TURMERIC CURCUMIN) CAPS Take 1 capsule by mouth every evening. 1500 mg capsule    . Multiple Vitamins-Minerals (WOMENS 50+ MULTI VITAMIN/MIN) TABS Take 1 tablet by mouth daily.    . naproxen sodium (ANAPROX) 220 MG tablet Take 220-440 mg by  mouth 2 (two) times daily as needed (for pain).     . Omega-3 Fatty Acids (FISH OIL) 1000 MG CAPS Take 2,000 mg by mouth 2 (two) times daily.    Marland Kitchen OVER THE COUNTER MEDICATION Take 250 mg by mouth daily. Jiaogulan Supplement    . pantoprazole (PROTONIX) 40 MG tablet Take 1 tablet (40 mg total) by mouth every evening.    . pravastatin (PRAVACHOL) 20 MG tablet Take 20 mg by mouth every evening.  3  . ranitidine (ZANTAC) 150 MG tablet Take 150-300 mg by mouth every evening. 300 mg with more acidic meals     No current facility-administered medications for this visit.     Review of Systems  Constitutional: positive for fatigue Eyes: negative Ears, nose, mouth, throat, and face: negative Respiratory: positive for cough and dyspnea on exertion Cardiovascular: negative Gastrointestinal: negative Genitourinary:negative Integument/breast: negative Hematologic/lymphatic: negative Musculoskeletal:negative Neurological: positive for headaches Behavioral/Psych: negative Endocrine: negative Allergic/Immunologic: negative  Physical Exam  RUE:AVWUJ, healthy, no distress, well nourished, well developed and anxious SKIN: skin color, texture, turgor are normal, no rashes or significant lesions HEAD: Normocephalic, No masses, lesions, tenderness or abnormalities EYES: normal, PERRLA, Conjunctiva are pink and non-injected EARS: External ears normal, Canals clear OROPHARYNX:no  exudate, no erythema and lips, buccal mucosa, and tongue normal  NECK: supple, no adenopathy, no JVD LYMPH:  no palpable lymphadenopathy, no hepatosplenomegaly BREAST:not examined LUNGS: clear to auscultation , and palpation HEART: regular rate & rhythm, no murmurs and no gallops ABDOMEN:abdomen soft, non-tender, normal bowel sounds and no masses or organomegaly BACK: Back symmetric, no curvature., No CVA tenderness EXTREMITIES:no joint deformities, effusion, or inflammation, no edema, no skin discoloration  NEURO: alert &  oriented x 3 with fluent speech, no focal motor/sensory deficits  PERFORMANCE STATUS: ECOG 1  LABORATORY DATA: Lab Results  Component Value Date   WBC 8.3 04/18/2016   HGB 14.6 04/18/2016   HCT 44.0 04/18/2016   MCV 95.2 04/18/2016   PLT 268 04/18/2016      Chemistry      Component Value Date/Time   NA 139 04/18/2016 1358   K 3.9 04/18/2016 1358   CL 107 04/06/2016 0742   CO2 25 04/18/2016 1358   BUN 9.4 04/18/2016 1358   CREATININE 0.7 04/18/2016 1358      Component Value Date/Time   CALCIUM 9.6 04/18/2016 1358   ALKPHOS 97 04/18/2016 1358   AST 26 04/18/2016 1358   ALT 45 04/18/2016 1358   BILITOT 0.49 04/18/2016 1358       RADIOGRAPHIC STUDIES: Ct Chest Wo Contrast  Result Date: 03/22/2016 CLINICAL DATA:  Follow-up left lung nodules, smoker EXAM: CT CHEST WITHOUT CONTRAST TECHNIQUE: Multidetector CT imaging of the chest was performed following the standard protocol without IV contrast. COMPARISON:  CT chest dated 08/02/2015 FINDINGS: Cardiovascular: The heart is normal in size. No pericardial effusion. No evidence of thoracic aortic aneurysm. Mediastinum/Nodes: Soft tissue mass in the left hilar/ suprahilar region, measuring 5.0 x 3.7 cm (series 2/ image 54). Additional 1.8 cm AP window node (series 2/ image 47). Visualized thyroid is unremarkable. Lungs/Pleura: Left perihilar soft tissue mass (series 3/ image 54), as noted above. Additional patchy/nodular 1.2 x 2.4 cm opacity in the lingula (series 3/image 34). Scattered left upper lobe nodules measuring up to 10 mm in the medial left upper lobe (series 3/image 59). Underlying mild paraseptal emphysematous changes, upper lobe predominant. No focal consolidation. No pleural effusion or pneumothorax. Upper Abdomen: Visualized upper abdomen is unremarkable. Musculoskeletal: Very mild degenerative changes of the mid thoracic spine. IMPRESSION: 5.0 x 3.7 cm left perihilar soft tissue mass, suspicious for primary bronchogenic  neoplasm. Consider bronchoscopy for tissue confirmation. 1.8 cm AP window node, suspicious for nodal metastasis. 1.2 x 2.4 cm nodular opacity in the lingula, suspicious for pulmonary metastasis versus postobstructive infection. Additional left upper lobe nodules measuring up to 10 mm are suspicious for metastases. These results will be called to the ordering clinician or representative by the Radiologist Assistant, and communication documented in the PACS or zVision Dashboard. Electronically Signed   By: Julian Hy M.D.   On: 03/22/2016 08:50   Nm Pet Image Initial (pi) Skull Base To Thigh  Result Date: 04/04/2016 CLINICAL DATA:  Initial treatment strategy for pulmonary nodule. EXAM: NUCLEAR MEDICINE PET SKULL BASE TO THIGH TECHNIQUE: 9.66 mCi F-18 FDG was injected intravenously. Full-ring PET imaging was performed from the skull base to thigh after the radiotracer. CT data was obtained and used for attenuation correction and anatomic localization. FASTING BLOOD GLUCOSE:  Value: 100 mg/dl COMPARISON:  CT 03/21/2016 FINDINGS: NECK No hypermetabolic lymph nodes in the neck. CHEST Hypermetabolic LEFT suprahilar mass measures 3.8 cm with SUV max equal 11.8. Mass constricts the upper lobe bronchus. Hypermetabolic subpleural nodule  in the LEFT upper lobe just anterior to the fissure measures 7 mm (image 71, series 4) with SUV max equal 3.4. There is a hypermetabolic AP window lymph node measuring 1.6 cm with intense metabolic activity (SUV max equals 7.6). Hypermetabolic subcarinal lymph node with SUV max equal 7.8. No hypermetabolic supraclavicular lymph nodes. ABDOMEN/PELVIS No abnormal hypermetabolic activity within the liver, pancreas, adrenal glands, or spleen. No hypermetabolic lymph nodes in the abdomen or pelvis. Cystic lesion adjacent to the LEFT iliac vessels measures 3.2 cm (image 179, series 4) and has simple fluid attenuation. The solid portion of the lesion ventrally with a focus of metabolic  activity (SUV max equal 3.1). This is favored the LEFT ovary. Post hysterectomy. The RIGHT ovary is present on image 163 measuring 24 mm has a similar appearance. SKELETON No focal hypermetabolic activity to suggest skeletal metastasis. IMPRESSION: 1. 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 2. Ipsilateral AP window nodal metastasis and subcarinal nodal metastasis. 3. Subpleural nodal metastasis along the LEFT oblique fissure. 4. No evidence distant metastatic disease. 5. Uptake within the LEFT and RIGHT ovarian tissue is favored physiologic Electronically Signed   By: Suzy Bouchard M.D.   On: 04/04/2016 09:35   Ct Maxillofacial Wo Contrast  Result Date: 03/22/2016 CLINICAL DATA:  Recurring sinus problems, worse on the RIGHT. EXAM: CT MAXILLOFACIAL WITHOUT CONTRAST TECHNIQUE: Multidetector CT imaging of the maxillofacial structures was performed. Multiplanar CT image reconstructions were also generated. A small metallic BB was placed on the right temple in order to reliably differentiate right from left. COMPARISON:  CT scan from [facility] 12/23/2015. Also CT maxillofacial limited 10/19/2015. FINDINGS: Osseous: No acute findings.  BILATERAL  mandibular tori. Orbits: Negative. No traumatic or inflammatory finding. Sinuses: Previously noted mucosal thickening, foamy secretions, and layering fluid have all cleared. Nasal septum essentially midline. Soft tissues: Negative. Limited intracranial: No significant or unexpected finding. IMPRESSION: Resolution of previously noted chronic and acute pansinusitis. Electronically Signed   By: Staci Righter M.D.   On: 03/22/2016 07:50    ASSESSMENT: This is a very pleasant 50 years old white female recently diagnosed with stage IIIA/IIIB (T2a/T3, N2, M0) non-small cell lung cancer favoring adenocarcinoma presenting with left suprahilar mass in addition to mediastinal lymphadenopathy and suspicious pulmonary nodules in the  left upper lobe diagnosed in November 2017.   PLAN: I had a lengthy discussion with the patient today about her current disease stage, prognosis and treatment options. I recommended for the patient to complete the staging workup by ordering a MRI of the brain to rule out brain metastasis. I will give the patient the benefit of the doubt and treating her as a stage IIIa non-small cell lung cancer. I recommended for the patient a course of concurrent chemoradiation with weekly carboplatin for AUC of 2 and paclitaxel 45 MG/M2. I discussed with the patient adverse effect of the chemotherapy including but not limited to alopecia, myelosuppression, nausea and vomiting, peripheral neuropathy, liver or renal dysfunction. I will arrange for the patient to have a chemotherapy education class before starting the first dose of his chemotherapy. I will refer the patient to radiation oncology for evaluation and discussion of the radiotherapy option. I will also arrange for the patient to have blood test by [facility] for molecular studies since she has insufficient material for molecular testing. She is expected to start the first dose of her concurrent chemoradiation on 05/02/2016. I will call her pharmacy with prescription for Compazine 10 mg by mouth  every 6 hours as needed for nausea. She will come back for follow-up visit in 3 weeks for evaluation and management of any adverse effect of her treatment. She was advised to call immediately if she has any concerning symptoms in the interval. The patient voices understanding of current disease status and treatment options and is in agreement with the current care plan.  All questions were answered. The patient knows to call the clinic with any problems, questions or concerns. We can certainly see the patient much sooner if necessary.  Thank you so much for allowing me to participate in the care of Debbie Baker. I will continue to follow up the patient with you  and assist in her care.  I spent 55 minutes counseling the patient face to face. The total time spent in the appointment was 80 minutes.  Disclaimer: This note was dictated with voice recognition software. Similar sounding words can inadvertently be transcribed and may not be corrected upon review.   Teresa Nicodemus K. April 18, 2016, 3:37 PM

## 2016-04-18 NOTE — Progress Notes (Signed)
Oncology Nurse Navigator Documentation  Oncology Nurse Navigator Flowsheets 04/18/2016  Navigator Location [facility]  Navigator Encounter Type Clinic/MDC/per Dr. Julien Nordmann, he would like to have [facility] blood work completed today.  I completed request form and patient signed.  Patient went to scheduling then back to lab for blood work.   Patient Visit Type MedOnc  Treatment Phase Pre-Tx/Tx Discussion  Barriers/Navigation Needs Coordination of Care  Interventions Coordination of Care  Coordination of Care Other  Acuity Level 2  Acuity Level 2 Other  Time Spent with Patient 30

## 2016-04-18 NOTE — Telephone Encounter (Signed)
Appointments scheduled per 11/20 LOS. Patient given AVS report and calendars with future scheduled appointments. °

## 2016-04-19 ENCOUNTER — Telehealth: Payer: Self-pay | Admitting: *Deleted

## 2016-04-19 ENCOUNTER — Telehealth: Payer: Self-pay | Admitting: Internal Medicine

## 2016-04-19 NOTE — Telephone Encounter (Signed)
Per LOS I have scheduled appts and notified the scheduler 

## 2016-04-19 NOTE — Telephone Encounter (Signed)
Appointment rescheduled per patient request. Patient has a prior appointment that conflicts with the time.

## 2016-04-23 ENCOUNTER — Ambulatory Visit (HOSPITAL_BASED_OUTPATIENT_CLINIC_OR_DEPARTMENT_OTHER)
Admission: RE | Admit: 2016-04-23 | Discharge: 2016-04-23 | Disposition: A | Discharge status: Home / Self Care | Payer: No Typology Code available for payment source | Source: Ambulatory Visit | Attending: Internal Medicine | Admitting: Internal Medicine

## 2016-04-23 DIAGNOSIS — C3492 Malignant neoplasm of unspecified part of left bronchus or lung: Secondary | ICD-10-CM | POA: Diagnosis present

## 2016-04-23 DIAGNOSIS — Z5111 Encounter for antineoplastic chemotherapy: Secondary | ICD-10-CM

## 2016-04-23 MED ORDER — GADOBENATE DIMEGLUMINE 529 MG/ML IV SOLN: 16.0000 mL | Freq: Once | INTRAVENOUS | Status: DC | PRN

## 2016-04-25 ENCOUNTER — Ambulatory Visit: Payer: No Typology Code available for payment source | Admitting: Radiation Oncology

## 2016-04-25 ENCOUNTER — Ambulatory Visit
Admission: RE | Admit: 2016-04-25 | Discharge: 2016-04-25 | Disposition: A | Discharge status: Home / Self Care | Payer: No Typology Code available for payment source | Source: Ambulatory Visit | Attending: Radiation Oncology | Admitting: Radiation Oncology

## 2016-04-25 ENCOUNTER — Encounter: Payer: Self-pay | Admitting: Radiation Oncology

## 2016-04-25 VITALS — BP 113/75 | HR 78 | Temp 98.0°F | Resp 20 | Wt 195.8 lb

## 2016-04-25 DIAGNOSIS — Z510 Encounter for antineoplastic radiation therapy: Secondary | ICD-10-CM | POA: Insufficient documentation

## 2016-04-25 DIAGNOSIS — C3492 Malignant neoplasm of unspecified part of left bronchus or lung: Secondary | ICD-10-CM

## 2016-04-25 DIAGNOSIS — C3412 Malignant neoplasm of upper lobe, left bronchus or lung: Secondary | ICD-10-CM | POA: Insufficient documentation

## 2016-04-25 DIAGNOSIS — Z87891 Personal history of nicotine dependence: Secondary | ICD-10-CM | POA: Insufficient documentation

## 2016-04-25 DIAGNOSIS — K219 Gastro-esophageal reflux disease without esophagitis: Secondary | ICD-10-CM | POA: Insufficient documentation

## 2016-04-25 DIAGNOSIS — R0600 Dyspnea, unspecified: Secondary | ICD-10-CM | POA: Insufficient documentation

## 2016-04-25 DIAGNOSIS — H918X2 Other specified hearing loss, left ear: Secondary | ICD-10-CM | POA: Insufficient documentation

## 2016-04-25 DIAGNOSIS — E785 Hyperlipidemia, unspecified: Secondary | ICD-10-CM | POA: Diagnosis not present

## 2016-04-25 HISTORY — DX: Malignant neoplasm of unspecified part of unspecified bronchus or lung: C34.90

## 2016-04-25 NOTE — Progress Notes (Signed)
Thoracic Location of Tumor / Histology: Non small cell lung cancer favoring adenocarcinoma with left suprahilar mass in addition to mediastinal lymphadenopathy and suspicious pulmonary nodules in the left upper lobe  Patient mentioned that since Thanksgiving of 2016 she has had a cough and sinus congestion. She was treated for aspiration pneumonia with three courses of antibiotics. A follow up CT scan revealed lung nodularity.   Biopsy of lymph node:   Tobacco/Marijuana/Snuff/ETOH use: Quit smoking cigarettes six years ago but, continues to smoke marijuana.  Past/Anticipated interventions by cardiothoracic surgery, if any: no  Past/Anticipated interventions by medical oncology, if any: Staging work up to include brain MRI (negative) and a blood test for molecular study. Planning for concurrent chemoradiation with weekly carboplatin for AUC of 2 and paclitaxel 45 MG/M2 starting 05/02/16. Chemo education scheduled for 11/28  Signs/Symptoms  Weight changes, if any: no  Respiratory complaints, if any: reports a productive cough with clear frothy sputum in the AM and shortness of breath  Hemoptysis, if any: reports one episode of hemoptysis one year ago but, none since  Pain issues, if any:  Describes a rubbing left side chest pain when she coughs  SAFETY ISSUES:  Prior radiation? no  Pacemaker/ICD?  no  Possible current pregnancy?no  Is the patient on methotrexate? no  Current Complaints / other details:  50 year old female. Divorced. Three sons that live in [place]. 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 Father with esophageal ca and mother with breast ca (recently diagnosed; resides in [place]).

## 2016-04-25 NOTE — Progress Notes (Signed)
  Radiation Oncology         [phone] ________________________________  Name: Debbie Baker MRN: [id number]  Date: 04/25/2016  DOB: Apr 06, 1966  SIMULATION AND TREATMENT PLANNING NOTE    ICD-9-CM ICD-10-CM   1. Adenocarcinoma of left lung, stage 3 (HCC) 162.9 C34.92     DIAGNOSIS:  50 yo woman with stage T2a N2 M0 adenocarcinoma of the left upper lung - Stage IIIA  NARRATIVE:  The patient was brought to the [facility].  Identity was confirmed.  All relevant records and images related to the planned course of therapy were reviewed.  The patient freely provided informed written consent to proceed with treatment after reviewing the details related to the planned course of therapy. The consent form was witnessed and verified by the simulation staff.  Then, the patient was set-up in a stable reproducible  supine position for radiation therapy.  CT images were obtained.  Surface markings were placed.  The CT images were loaded into the planning software.  Then the target and avoidance structures were contoured.  Treatment planning then occurred.  The radiation prescription was entered and confirmed.  Then, I designed and supervised the construction of a total of 6 medically necessary complex treatment devices, including a BodyFix immobilization mold custom fitted to the patient along with 5 multileaf collimators conformally shaped radiation around the treatment target while shielding critical structures such as the heart and spinal cord maximally.  I have requested : 3D Simulation  I have requested a DVH of the following structures: Left lung, right lung, spinal cord, heart, esophagus, and target.  I have ordered:Nutrition Consult  SPECIAL TREATMENT PROCEDURE:  The planned course of therapy using radiation constitutes a special treatment procedure. Special care is required in the management of this patient for the following reasons.  The patient will be receiving concurrent chemotherapy  requiring careful monitoring for increased toxicities of treatment including periodic laboratory values.  The special nature of the planned course of radiotherapy will require increased physician supervision and oversight to ensure patient's safety with optimal treatment outcomes.  PLAN:  The patient will receive 66 Gy in 33 fractions.  ________________________________  Sheral Apley Tammi Klippel, M.D.

## 2016-04-25 NOTE — Progress Notes (Signed)
[facility]         [phone] ________________________________  Initial Outpatient Consultation  Name: Debbie Baker MRN: [id number]  Date: 04/25/2016  DOB: [birth year]  REFERRING PHYSICIAN: Curt Bears, MD  DIAGNOSIS: The encounter diagnosis was Adenocarcinoma of left lung, stage 3 ([facility]).    ICD-9-CM ICD-10-CM   1. Adenocarcinoma of left lung, stage 3 (HCC) 162.9 C34.92    50 yo woman with stage T2a N2 M0 adenocarcinoma of the left upper lung - Stage IIIA  HISTORY OF PRESENT ILLNESS::Debbie Baker is a 50 y.o. female who had a 3 month history of cough and sinus congestion in November 2016. She was treated for pneumonia with 3 courses of antibiotics. On 06/25/15, patient had a CT angiogram of the chest. This revealed nodular consolidation and segmental airspace disease in the left upper lobe suggesting bronchopneumonia. The patient was followed with observation and repeat CT scans. CT scan of the chest on 03/21/16 revealed a 5.0 x 3.7 cm left perihilar soft tissue mass suggesting primary bronchogenic neoplasm.   PET-CT on 04/04/16 showed hypermetabolism in the left upper lung lesion, AP window, and subcarinal node.     Fiberoptic bronchoscopy with endobronchial ultrasound on 04/06/16 revealed non-small cell lung cancer favoring adenocarcinoma.   Patient had brain MRI (negative for metastatic disease) and a blood test for molecular study. Planning for concurrent chemoradiation with weekly carboplatin for AUC of 2 and paclitaxel 45 MG/M2 starting 05/02/16. Chemo education scheduled for 04/26/16.  Patient denies weight changes. She reports a productive cough with clear frothy sputum in the morning and shortness of breath. She describes a "rubbing" left side chest pain when she coughs. Patient notes an episode of hemoptysis one year ago.  PREVIOUS RADIATION THERAPY: No  Past Medical History:  Diagnosis Date  . Allergic rhinitis   . Deaf, left   . Diverticulosis     . Dyspnea   . Encounter for antineoplastic chemotherapy 04/18/2016  . GERD (gastroesophageal reflux disease)   . Hyperlipidemia   . Lung cancer ([place])    non small cell lung cancer favoring adenocarcinoma   . Lung nodules   . Migraine   . Pneumonia   . PONV (postoperative nausea and vomiting)   . Wears glasses   :   Past Surgical History:  Procedure Laterality Date  . ABDOMINAL HYSTERECTOMY    . VIDEO BRONCHOSCOPY Bilateral 07/03/2015   Procedure: VIDEO BRONCHOSCOPY WITH FLUORO;  Surgeon: Marshell Garfinkel, MD;  Location: [facility];  Service: Cardiopulmonary;  Laterality: Bilateral;  . VIDEO BRONCHOSCOPY WITH ENDOBRONCHIAL ULTRASOUND N/A 04/06/2016   Procedure: VIDEO BRONCHOSCOPY WITH ENDOBRONCHIAL ULTRASOUND;  Surgeon: Collene Gobble, MD;  Location: [facility];  Service: Thoracic;  Laterality: N/A;  . WRIST FRACTURE SURGERY    :   Current Outpatient Prescriptions:  .  benzonatate (TESSALON) 100 MG capsule, Take 1 capsule (100 mg total) by mouth every 6 (six) hours as needed for cough., Disp: 30 capsule, Rfl: 1 .  Calcium-Magnesium-Vitamin D 185-50-100 MG-MG-UNIT CAPS, Take 1 tablet by mouth every evening., Disp: , Rfl:  .  cetirizine (ZYRTEC) 10 MG tablet, Take 10 mg by mouth daily., Disp: , Rfl:  .  ELDERBERRY PO, Take 2 tablets by mouth daily. Chewable Supplement, Disp: , Rfl:  .  Misc Natural Products (BLACK COHOSH MENOPAUSE COMPLEX) TABS, Take 1 capsule by mouth 2 (two) times daily., Disp: , Rfl:  .  Misc Natural Products (TURMERIC CURCUMIN) CAPS, Take 1 capsule by mouth every evening. [place]  mg capsule, Disp: , Rfl:  .  Multiple Vitamins-Minerals (WOMENS 50+ MULTI VITAMIN/MIN) TABS, Take 1 tablet by mouth daily., Disp: , Rfl:  .  naproxen sodium (ANAPROX) 220 MG tablet, Take 220-440 mg by mouth 2 (two) times daily as needed (for pain). , Disp: , Rfl:  .  Omega-3 Fatty Acids (FISH OIL) 1000 MG CAPS, Take 2,000 mg by mouth 2 (two) times daily., Disp: , Rfl:  .  OVER THE COUNTER  MEDICATION, Take 250 mg by mouth daily. Jiaogulan Supplement, Disp: , Rfl:  .  pantoprazole (PROTONIX) 40 MG tablet, Take 1 tablet (40 mg total) by mouth every evening., Disp: , Rfl:  .  pravastatin (PRAVACHOL) 20 MG tablet, Take 20 mg by mouth every evening., Disp: , Rfl: 3 .  ranitidine (ZANTAC) 150 MG tablet, Take 150-300 mg by mouth every evening. 300 mg with more acidic meals, Disp: , Rfl:  .  prochlorperazine (COMPAZINE) 10 MG tablet, Take 1 tablet (10 mg total) by mouth every 6 (six) hours as needed for nausea or vomiting. (Patient not taking: Reported on 04/25/2016), Disp: 30 tablet, Rfl: 0:   Allergies  Allergen Reactions  . Penicillins Anaphylaxis and Rash     Has patient had a PCN reaction causing immediate rash, facial/tongue/throat swelling, SOB or lightheadedness with hypotension: # # YES # # Has patient had a PCN reaction causing severe rash involving mucus membranes or skin necrosis: No Has patient had a PCN reaction that required hospitalization No Has patient had a PCN reaction occurring within the last 10 years: # # YES # #  If all of the above answers are "NO", then may proceed with Cephalosporin use.   . Chantix [Varenicline] Other (See Comments)    Nightmares/lack of sleep  . Levaquin [Levofloxacin] Other (See Comments)    Muscle weakness/difficulty walking  :   Family History  Problem Relation Age of Onset  . Esophageal cancer Father   . Breast cancer Mother   :   Social History   Social History  . Marital status: Single    Spouse name: N/A  . Number of children: N/A  . Years of education: N/A   Occupational History  . Not on file.   Social History Main Topics  . Smoking status: Former Smoker    Packs/day: 1.50    Years: 30.00    Types: Cigarettes    Quit date: 08/02/2010  . Smokeless tobacco: Never Used  . Alcohol use 0.0 oz/week     Comment: 1-2 drinks daily  . Drug use:     Types: Marijuana     Comment: last use 02/2016  . Sexual activity:  Yes    Birth control/ protection: Surgical   Other Topics Concern  . Not on file   Social History Narrative   10 cats   Lives with boyfriend   Not employed   Former employee of [employer]   3 children living in [place]  :  [place]:  A 15 point review of systems is documented in the electronic medical record. This was obtained by the nursing staff. However, I reviewed this with the patient to discuss relevant findings and make appropriate changes.  per med-onc  Constitutional: positive for fatigue Eyes: negative Ears, nose, mouth, throat, and face: negative Respiratory: positive for cough and dyspnea on exertion Cardiovascular: negative Gastrointestinal: negative Genitourinary:negative Integument/breast: negative Hematologic/lymphatic: negative Musculoskeletal:negative Neurological: positive for headaches Behavioral/Psych: negative Endocrine: negative Allergic/Immunologic: negative I reviewed this with the  patient today   PHYSICAL EXAM:  Blood pressure 113/75, pulse 78, temperature 98 F (36.7 C), temperature source Oral, resp. rate 20, weight 195 lb 12.8 oz (88.8 kg), SpO2 98 %. per med-onc QQV:ZDGLO, healthy, no distress, well nourished, well developed and anxious SKIN: skin color, texture, turgor are normal, no rashes or significant lesions HEAD: Normocephalic, No masses, lesions, tenderness or abnormalities EYES: normal, PERRLA, Conjunctiva are pink and non-injected EARS: External ears normal, Canals clear OROPHARYNX:no exudate, no erythema and lips, buccal mucosa, and tongue normal  NECK: supple, no adenopathy, no JVD LYMPH:  no palpable lymphadenopathy, no hepatosplenomegaly BREAST:not examined LUNGS: clear to auscultation , and palpation HEART: regular rate & rhythm, no murmurs and no gallops ABDOMEN:abdomen soft, non-tender, normal bowel sounds and no masses or organomegaly BACK: Back symmetric, no curvature., No CVA  tenderness EXTREMITIES:no joint deformities, effusion, or inflammation, no edema, no skin discoloration  NEURO: alert & oriented x 3 with fluent speech, no focal motor/sensory deficits  KPS = 90  100 - Normal; no complaints; no evidence of disease. 90   - Able to carry on normal activity; minor signs or symptoms of disease. 80   - Normal activity with effort; some signs or symptoms of disease. 26   - Cares for self; unable to carry on normal activity or to do active work. 60   - Requires occasional assistance, but is able to care for most of his personal needs. 50   - Requires considerable assistance and frequent medical care. 47   - Disabled; requires special care and assistance. 29   - Severely disabled; hospital admission is indicated although death not imminent. 105   - Very sick; hospital admission necessary; active supportive treatment necessary. 10   - Moribund; fatal processes progressing rapidly. 0     - Dead  Karnofsky DA, Abelmann [place], Craver LS and Burchenal [facility] [phone]) The use of the nitrogen mustards in the palliative treatment of carcinoma: with particular reference to bronchogenic carcinoma Cancer 1 634-56  LABORATORY DATA:  Lab Results  Component Value Date   WBC 8.3 04/18/2016   HGB 14.6 04/18/2016   HCT 44.0 04/18/2016   MCV 95.2 04/18/2016   PLT 268 04/18/2016   Lab Results  Component Value Date   NA 139 04/18/2016   K 3.9 04/18/2016   CL 107 04/06/2016   CO2 25 04/18/2016   Lab Results  Component Value Date   ALT 45 04/18/2016   AST 26 04/18/2016   ALKPHOS 97 04/18/2016   BILITOT 0.49 04/18/2016     RADIOGRAPHY: Mr Jeri Cos PP Contrast  Result Date: 04/23/2016 CLINICAL DATA:  50 year old female recently diagnosed with stage III left lung adenocarcinoma. Staging. Subsequent encounter. EXAM: MRI HEAD WITHOUT AND WITH CONTRAST TECHNIQUE: Multiplanar, multiecho pulse sequences of the brain and surrounding structures were obtained without and with intravenous  contrast. CONTRAST:  16 mL MultiHance COMPARISON:  Face CT 03/21/2016.  Brain MRI 01/27/2004. FINDINGS: Brain: There is a small right operculum developmental venous anomaly (normal variant series 12, image 33) which as expected is unchanged since 2005. No abnormal enhancement identified. No midline shift, mass effect, or evidence of intracranial mass lesion. No dural thickening. No restricted diffusion to suggest acute infarction. No ventriculomegaly, extra-axial collection or acute intracranial hemorrhage. Cervicomedullary junction and pituitary are within normal limits. Pearline Cables and white matter signal is within normal limits for age throughout the brain. No encephalomalacia or definite chronic cerebral blood products. Vascular: Major intracranial vascular flow voids are stable  since 2005 and within normal limits. Skull and upper cervical spine: Negative visualized cervical spinal cord. Visible bone marrow signal is stable and within normal limits. Sinuses/Orbits: Stable and negative. Minimal paranasal sinus mucosal thickening. Other: Visible internal auditory structures appear normal. Mastoids are clear. Negative scalp soft tissues. IMPRESSION: No metastatic disease or acute intracranial abnormality. Stable and normal MRI appearance of the brain. Electronically Signed   By: Genevie Ann M.D.   On: 04/23/2016 14:49   Nm Pet Image Initial (pi) Skull Base To Thigh  Result Date: 04/04/2016 CLINICAL DATA:  Initial treatment strategy for pulmonary nodule. EXAM: NUCLEAR MEDICINE PET SKULL BASE TO THIGH TECHNIQUE: 9.66 mCi F-18 FDG was injected intravenously. Full-ring PET imaging was performed from the skull base to thigh after the radiotracer. CT data was obtained and used for attenuation correction and anatomic localization. FASTING BLOOD GLUCOSE:  Value: 100 mg/dl COMPARISON:  CT 03/21/2016 FINDINGS: NECK No hypermetabolic lymph nodes in the neck. CHEST Hypermetabolic LEFT suprahilar mass measures 3.8 cm with SUV max  equal 11.8. Mass constricts the upper lobe bronchus. Hypermetabolic subpleural nodule in the LEFT upper lobe just anterior to the fissure measures 7 mm (image 71, series 4) with SUV max equal 3.4. There is a hypermetabolic AP window lymph node measuring 1.6 cm with intense metabolic activity (SUV max equals 7.6). Hypermetabolic subcarinal lymph node with SUV max equal 7.8. No hypermetabolic supraclavicular lymph nodes. ABDOMEN/PELVIS No abnormal hypermetabolic activity within the liver, pancreas, adrenal glands, or spleen. No hypermetabolic lymph nodes in the abdomen or pelvis. Cystic lesion adjacent to the LEFT iliac vessels measures 3.2 cm (image 179, series 4) and has simple fluid attenuation. The solid portion of the lesion ventrally with a focus of metabolic activity (SUV max equal 3.1). This is favored the LEFT ovary. Post hysterectomy. The RIGHT ovary is present on image 163 measuring 24 mm has a similar appearance. SKELETON No focal hypermetabolic activity to suggest skeletal metastasis. IMPRESSION: 1. 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 2. Ipsilateral AP window nodal metastasis and subcarinal nodal metastasis. 3. Subpleural nodal metastasis along the LEFT oblique fissure. 4. No evidence distant metastatic disease. 5. Uptake within the LEFT and RIGHT ovarian tissue is favored physiologic Electronically Signed   By: Suzy Bouchard M.D.   On: 04/04/2016 09:35      IMPRESSION: 50 y.o. woman with stage IIIA adenocarcinoma of the left upper lung. She may benefit from radiation and concurrent chemotherapy.  PLAN:  Today, I talked to the patient and family about the findings and work-up thus far.  We discussed the natural history of stage III non-small cell lung cancer and general treatment, highlighting the role of radiotherapy in the management.  We discussed the available radiation techniques, and focused on the details of logistics and delivery.  We reviewed  the anticipated acute and late sequelae associated with radiation in this setting.  The patient was encouraged to ask questions that I answered to the best of my ability.   The patient would like to proceed with radiation and will be scheduled for CT simulation at 3 pm today 04/25/16.  I spent 60 minutes face to face with the patient and more than 50% of that time was spent in counseling and/or coordination of care.    ------------------------------------------------   Tyler Pita, MD [facility] Director and Director of Stereotactic Radiosurgery Direct Dial: [phone]  Fax: [phone] [email or web address]  Skype  [email or web address]   This document serves as a  record of services personally performed by Tyler Pita, MD. It was created on his behalf by Bethann Humble, a trained medical scribe. The creation of this record is based on the scribe's personal observations and the provider's statements to them. This document has been checked and approved by the attending provider.

## 2016-04-25 NOTE — Progress Notes (Signed)
See progress note under physician encounter. 

## 2016-04-26 ENCOUNTER — Other Ambulatory Visit: Payer: No Typology Code available for payment source

## 2016-04-26 ENCOUNTER — Encounter: Payer: Self-pay | Admitting: *Deleted

## 2016-04-28 ENCOUNTER — Institutional Professional Consult (permissible substitution): Payer: No Typology Code available for payment source | Admitting: Radiation Oncology

## 2016-04-28 ENCOUNTER — Encounter: Payer: Self-pay | Admitting: General Practice

## 2016-04-28 DIAGNOSIS — Z510 Encounter for antineoplastic radiation therapy: Secondary | ICD-10-CM | POA: Diagnosis not present

## 2016-04-28 NOTE — Progress Notes (Signed)
[facility] Spiritual Care Note  Referred by Alleta Cassachia/RN, who learned of pt's grief/loss concerns in chemo class.  Per RN, pt welcomed Spiritual Care f/u.  Nathaneil Canary by phone to introduce myself and offer support; we plan for me to meet her in infusion during her first tx on Monday 12/4.  She is aware of ongoing Support Team availability, but please also page if immediate needs arise.  Thank you.   [place], [place], [facility] Pager [phone] Voicemail [phone]

## 2016-04-29 ENCOUNTER — Other Ambulatory Visit: Payer: No Typology Code available for payment source

## 2016-04-29 ENCOUNTER — Inpatient Hospital Stay: Payer: No Typology Code available for payment source | Admitting: Pulmonary Disease

## 2016-05-02 ENCOUNTER — Ambulatory Visit (HOSPITAL_BASED_OUTPATIENT_CLINIC_OR_DEPARTMENT_OTHER): Payer: No Typology Code available for payment source | Admitting: Nurse Practitioner

## 2016-05-02 ENCOUNTER — Other Ambulatory Visit (HOSPITAL_BASED_OUTPATIENT_CLINIC_OR_DEPARTMENT_OTHER): Payer: No Typology Code available for payment source

## 2016-05-02 ENCOUNTER — Ambulatory Visit (HOSPITAL_BASED_OUTPATIENT_CLINIC_OR_DEPARTMENT_OTHER): Payer: No Typology Code available for payment source

## 2016-05-02 ENCOUNTER — Encounter: Payer: Self-pay | Admitting: General Practice

## 2016-05-02 ENCOUNTER — Encounter: Payer: Self-pay | Admitting: Nurse Practitioner

## 2016-05-02 ENCOUNTER — Ambulatory Visit
Admission: RE | Admit: 2016-05-02 | Discharge: 2016-05-02 | Disposition: A | Discharge status: Home / Self Care | Payer: No Typology Code available for payment source | Source: Ambulatory Visit | Attending: Radiation Oncology | Admitting: Radiation Oncology

## 2016-05-02 VITALS — BP 129/73 | HR 64 | Temp 98.0°F | Resp 16

## 2016-05-02 DIAGNOSIS — T80818A Extravasation of other vesicant agent, initial encounter: Secondary | ICD-10-CM | POA: Insufficient documentation

## 2016-05-02 DIAGNOSIS — Z5111 Encounter for antineoplastic chemotherapy: Secondary | ICD-10-CM | POA: Diagnosis not present

## 2016-05-02 DIAGNOSIS — C3492 Malignant neoplasm of unspecified part of left bronchus or lung: Secondary | ICD-10-CM

## 2016-05-02 DIAGNOSIS — Z510 Encounter for antineoplastic radiation therapy: Secondary | ICD-10-CM | POA: Diagnosis not present

## 2016-05-02 DIAGNOSIS — T8089XA Other complications following infusion, transfusion and therapeutic injection, initial encounter: Secondary | ICD-10-CM

## 2016-05-02 LAB — COMPREHENSIVE METABOLIC PANEL
ALK PHOS: 89 U/L (ref 40–150)
ALT: 37 U/L (ref 0–55)
ANION GAP: 10 meq/L (ref 3–11)
AST: 26 U/L (ref 5–34)
Albumin: 3.8 g/dL (ref 3.5–5.0)
BILIRUBIN TOTAL: 0.61 mg/dL (ref 0.20–1.20)
BUN: 8.6 mg/dL (ref 7.0–26.0)
CALCIUM: 9.8 mg/dL (ref 8.4–10.4)
CO2: 22 meq/L (ref 22–29)
CREATININE: 0.8 mg/dL (ref 0.6–1.1)
Chloride: 107 mEq/L (ref 98–109)
Glucose: 103 mg/dl (ref 70–140)
Potassium: 3.8 mEq/L (ref 3.5–5.1)
Sodium: 140 mEq/L (ref 136–145)
TOTAL PROTEIN: 7.1 g/dL (ref 6.4–8.3)

## 2016-05-02 LAB — CBC WITH DIFFERENTIAL/PLATELET
BASO%: 1.3 % (ref 0.0–2.0)
Basophils Absolute: 0.1 10*3/uL (ref 0.0–0.1)
EOS ABS: 0.2 10*3/uL (ref 0.0–0.5)
EOS%: 2.8 % (ref 0.0–7.0)
HEMATOCRIT: 42.4 % (ref 34.8–46.6)
HGB: 14.4 g/dL (ref 11.6–15.9)
LYMPH#: 2.4 10*3/uL (ref 0.9–3.3)
LYMPH%: 42.5 % (ref 14.0–49.7)
MCH: 31.9 pg (ref 25.1–34.0)
MCHC: 33.9 g/dL (ref 31.5–36.0)
MCV: 94.1 fL (ref 79.5–101.0)
MONO#: 0.5 10*3/uL (ref 0.1–0.9)
MONO%: 8.2 % (ref 0.0–14.0)
NEUT%: 45.2 % (ref 38.4–76.8)
NEUTROS ABS: 2.6 10*3/uL (ref 1.5–6.5)
PLATELETS: 245 10*3/uL (ref 145–400)
RBC: 4.51 10*6/uL (ref 3.70–5.45)
RDW: 12.3 % (ref 11.2–14.5)
WBC: 5.7 10*3/uL (ref 3.9–10.3)

## 2016-05-02 MED ORDER — DEXAMETHASONE SODIUM PHOSPHATE 100 MG/10ML IJ SOLN
20.0000 mg | Freq: Once | INTRAMUSCULAR | Status: AC
  Administered 2016-05-02: 20 mg via INTRAVENOUS
  Filled 2016-05-02: qty 2

## 2016-05-02 MED ORDER — FAMOTIDINE IN NACL 20-0.9 MG/50ML-% IV SOLN
20.0000 mg | Freq: Once | INTRAVENOUS | Status: AC
  Administered 2016-05-02: 20 mg via INTRAVENOUS

## 2016-05-02 MED ORDER — CARBOPLATIN CHEMO INJECTION 450 MG/45ML
280.0000 mg | Freq: Once | INTRAVENOUS | Status: AC
  Administered 2016-05-02: 280 mg via INTRAVENOUS
  Filled 2016-05-02: qty 28

## 2016-05-02 MED ORDER — SODIUM CHLORIDE 0.9 % IV SOLN
Freq: Once | INTRAVENOUS | Status: AC
  Administered 2016-05-02: 09:00:00 via INTRAVENOUS

## 2016-05-02 MED ORDER — PALONOSETRON HCL INJECTION 0.25 MG/5ML
0.2500 mg | Freq: Once | INTRAVENOUS | Status: AC
  Administered 2016-05-02: 0.25 mg via INTRAVENOUS

## 2016-05-02 MED ORDER — SODIUM CHLORIDE 0.9 % IV SOLN
45.0000 mg/m2 | Freq: Once | INTRAVENOUS | Status: AC
  Administered 2016-05-02: 96 mg via INTRAVENOUS
  Filled 2016-05-02: qty 16

## 2016-05-02 MED ORDER — DIPHENHYDRAMINE HCL 50 MG/ML IJ SOLN
50.0000 mg | Freq: Once | INTRAMUSCULAR | Status: AC
  Administered 2016-05-02: 50 mg via INTRAVENOUS

## 2016-05-02 MED ORDER — COLD PACK MISC ONCOLOGY
1.0000 | Freq: Once | Status: DC | PRN
  Filled 2016-05-02: qty 1

## 2016-05-02 NOTE — Assessment & Plan Note (Signed)
Patient presented to the [facility] today to receive her first cycle of carboplatin/Taxol chemotherapy regimen.  The Taxol infusion was infusing via the left radial peripheral IV site; and patient noted some discomfort and pressure to the IV insertion site.  The infusion was immediately held; and it was determined that there was possibly some extravasation of the chemotherapy drug to the tissue.   Peripheral IV was initiated to the right forearm instead to continue with the chemotherapy infusion.  On exam today, there was trace erythema only directly above the left radial peripheral IV insertion site.  There was no obvious tenderness, warmth, or red streaks noted.  Patient was advised to elevate her arm above the level of for heart when ever she is resting.  She may also continue to use cool compresses to the site.  Per protocol-patient will return for the next 2 days for recheck of the IV site.  She will also return next week as well.  She was advised to call/return or go directly to the emergency department for any worsening symptoms whatsoever.

## 2016-05-02 NOTE — Progress Notes (Signed)
[facility] Spiritual Care Note  Met with Debbie Baker in infusion this morning.  She was very appreciative to meet and speak with a support person, sharing some of her recent stressors (her own dx; mom's breast ca dx [Mom lives in [place]; and recent death of her middle son Joey due to overdose after hx engaged recovery [employer], [place]]).  Debbie Baker spoke frankly, identifying stressors, gifts, and strengths in a way that showed deep reflection and a goal of integration.  Suggested support resources that could be helpful in the future, such as [facility]'s support group for loss due to overdose, which she found helpful and normalizing to be aware of.  I plan to f/u when she is on campus, and she plans to call as desired, but please also page if immediate needs arise.  Thank you.   [place], [place], [facility] Pager [phone] Voicemail [phone]

## 2016-05-02 NOTE — Assessment & Plan Note (Signed)
Patient presented to the [facility] today to receive her first cycle of carboplatin/Taxol chemotherapy regimen.  Patient also continues to receive daily radiation treatments.  See further notes for details of today's visit.  Patient will return both Tuesday and Wednesday morning this week prior to her radiation treatments for recheck of the possible extravasation site.  Patient will also return on 05/09/2016 for labs, follow up visit, and her next cycle of chemotherapy.

## 2016-05-02 NOTE — Progress Notes (Signed)
SYMPTOM MANAGEMENT CLINIC    Chief Complaint: Chemotherapy extravasation  HPI:  Debbie Baker 50 y.o. female diagnosed with lung cancer.  Currently undergoing carboplatin/Taxol chemotherapy regimen; as well as radiation treatments.    No history exists.    Review of Systems  Skin:       ? Extravasation to left wrist site.   All other systems reviewed and are negative.   Past Medical History:  Diagnosis Date  . Allergic rhinitis   . Deaf, left   . Diverticulosis   . Dyspnea   . Encounter for antineoplastic chemotherapy 04/18/2016  . GERD (gastroesophageal reflux disease)   . Hyperlipidemia   . Lung cancer ([place])    non small cell lung cancer favoring adenocarcinoma   . Lung nodules   . Migraine   . Pneumonia   . PONV (postoperative nausea and vomiting)   . Wears glasses     Past Surgical History:  Procedure Laterality Date  . ABDOMINAL HYSTERECTOMY    . VIDEO BRONCHOSCOPY Bilateral 07/03/2015   Procedure: VIDEO BRONCHOSCOPY WITH FLUORO;  Surgeon: Marshell Garfinkel, MD;  Location: [facility];  Service: Cardiopulmonary;  Laterality: Bilateral;  . VIDEO BRONCHOSCOPY WITH ENDOBRONCHIAL ULTRASOUND N/A 04/06/2016   Procedure: VIDEO BRONCHOSCOPY WITH ENDOBRONCHIAL ULTRASOUND;  Surgeon: Collene Gobble, MD;  Location: [facility];  Service: Thoracic;  Laterality: N/A;  . WRIST FRACTURE SURGERY      has Hemoptysis; Abnormal CT scan of lung; Hilar mass; Mediastinal lymphadenopathy; Adenocarcinoma of left lung, stage 3 ([place]); Encounter for antineoplastic chemotherapy; and Extravasation accident, initial encounter on her problem list.    is allergic to penicillins; chantix [varenicline]; and levaquin [levofloxacin].    Medication List       Accurate as of 05/02/16  1:54 PM. Always use your most recent med list.          benzonatate 100 MG capsule Commonly known as:  TESSALON Take 1 capsule (100 mg total) by mouth every 6 (six) hours as needed for cough.   BLACK COHOSH MENOPAUSE  COMPLEX Tabs Take 1 capsule by mouth 2 (two) times daily.   Turmeric Curcumin Caps Take 1 capsule by mouth every evening. 1500 mg capsule   Calcium-Magnesium-Vitamin D 185-50-100 MG-MG-UNIT Caps Take 1 tablet by mouth every evening.   cetirizine 10 MG tablet Commonly known as:  ZYRTEC Take 10 mg by mouth daily.   ELDERBERRY PO Take 2 tablets by mouth daily. Chewable Supplement   Fish Oil 1000 MG Caps Take 2,000 mg by mouth 2 (two) times daily.   naproxen sodium 220 MG tablet Commonly known as:  ANAPROX Take 220-440 mg by mouth 2 (two) times daily as needed (for pain).   OVER THE COUNTER MEDICATION Take 250 mg by mouth daily. Jiaogulan Supplement   pantoprazole 40 MG tablet Commonly known as:  PROTONIX Take 1 tablet (40 mg total) by mouth every evening.   pravastatin 20 MG tablet Commonly known as:  PRAVACHOL Take 20 mg by mouth every evening.   prochlorperazine 10 MG tablet Commonly known as:  COMPAZINE Take 1 tablet (10 mg total) by mouth every 6 (six) hours as needed for nausea or vomiting.   ranitidine 150 MG tablet Commonly known as:  ZANTAC Take 150-300 mg by mouth every evening. 300 mg with more acidic meals   WOMENS 50+ MULTI VITAMIN/MIN Tabs Take 1 tablet by mouth daily.        PHYSICAL EXAMINATION  Oncology Vitals 05/02/2016 05/02/2016  Height - -  Weight - -  Weight (lbs) - -  BMI (kg/m2) - -  Temp - -  Pulse 64 65  Resp 16 16  SpO2 97 98  BSA (m2) - -   BP Readings from Last 2 Encounters:  05/02/16 129/73  04/25/16 113/75    Physical Exam  Constitutional: She is oriented to person, place, and time and well-developed, well-nourished, and in no distress.  HENT:  Head: Normocephalic and atraumatic.  Eyes: Conjunctivae and EOM are normal. Pupils are equal, round, and reactive to light.  Neck: Normal range of motion.  Pulmonary/Chest: Effort normal. No respiratory distress.  Musculoskeletal: Normal range of motion. She exhibits no edema or  tenderness.  Neurological: She is alert and oriented to person, place, and time.  Skin: Skin is warm and dry. There is erythema.  Psychiatric: Affect normal.  Nursing note and vitals reviewed.   LABORATORY DATA:. Appointment on 05/02/2016  Component Date Value Ref Range Status  . WBC 05/02/2016 5.7  3.9 - 10.3 10e3/uL Final  . NEUT# 05/02/2016 2.6  1.5 - 6.5 10e3/uL Final  . HGB 05/02/2016 14.4  11.6 - 15.9 g/dL Final  . HCT 05/02/2016 42.4  34.8 - 46.6 % Final  . Platelets 05/02/2016 245  145 - 400 10e3/uL Final  . MCV 05/02/2016 94.1  79.5 - 101.0 fL Final  . MCH 05/02/2016 31.9  25.1 - 34.0 pg Final  . MCHC 05/02/2016 33.9  31.5 - 36.0 g/dL Final  . RBC 05/02/2016 4.51  3.70 - 5.45 10e6/uL Final  . RDW 05/02/2016 12.3  11.2 - 14.5 % Final  . lymph# 05/02/2016 2.4  0.9 - 3.3 10e3/uL Final  . MONO# 05/02/2016 0.5  0.1 - 0.9 10e3/uL Final  . Eosinophils Absolute 05/02/2016 0.2  0.0 - 0.5 10e3/uL Final  . Basophils Absolute 05/02/2016 0.1  0.0 - 0.1 10e3/uL Final  . NEUT% 05/02/2016 45.2  38.4 - 76.8 % Final  . LYMPH% 05/02/2016 42.5  14.0 - 49.7 % Final  . MONO% 05/02/2016 8.2  0.0 - 14.0 % Final  . EOS% 05/02/2016 2.8  0.0 - 7.0 % Final  . BASO% 05/02/2016 1.3  0.0 - 2.0 % Final  . Sodium 05/02/2016 140  136 - 145 mEq/L Final  . Potassium 05/02/2016 3.8  3.5 - 5.1 mEq/L Final  . Chloride 05/02/2016 107  98 - 109 mEq/L Final  . CO2 05/02/2016 22  22 - 29 mEq/L Final  . Glucose 05/02/2016 103  70 - 140 mg/dl Final  . BUN 05/02/2016 8.6  7.0 - 26.0 mg/dL Final  . Creatinine 05/02/2016 0.8  0.6 - 1.1 mg/dL Final  . Total Bilirubin 05/02/2016 0.61  0.20 - 1.20 mg/dL Final  . Alkaline Phosphatase 05/02/2016 89  40 - 150 U/L Final  . AST 05/02/2016 26  5 - 34 U/L Final  . ALT 05/02/2016 37  0 - 55 U/L Final  . Total Protein 05/02/2016 7.1  6.4 - 8.3 g/dL Final  . Albumin 05/02/2016 3.8  3.5 - 5.0 g/dL Final  . Calcium 05/02/2016 9.8  8.4 - 10.4 mg/dL Final  . Anion Gap 05/02/2016  10  3 - 11 mEq/L Final  . EGFR 05/02/2016 >90  >90 ml/min/1.73 m2 Final    RADIOGRAPHIC STUDIES: No results found.  ASSESSMENT/PLAN:    Extravasation accident, initial encounter Patient presented to the [facility] today to receive her first cycle of carboplatin/Taxol chemotherapy regimen.  The Taxol infusion was infusing via the left radial peripheral IV site; and patient noted  some discomfort and pressure to the IV insertion site.  The infusion was immediately held; and it was determined that there was possibly some extravasation of the chemotherapy drug to the tissue.   Peripheral IV was initiated to the right forearm instead to continue with the chemotherapy infusion.  On exam today, there was trace erythema only directly above the left radial peripheral IV insertion site.  There was no obvious tenderness, warmth, or red streaks noted.  Patient was advised to elevate her arm above the level of for heart when ever she is resting.  She may also continue to use cool compresses to the site.  Per protocol-patient will return for the next 2 days for recheck of the IV site.  She will also return next week as well.  She was advised to call/return or go directly to the emergency department for any worsening symptoms whatsoever.  Adenocarcinoma of left lung, stage 3 ([place]) Patient presented to the [facility] today to receive her first cycle of carboplatin/Taxol chemotherapy regimen.  Patient also continues to receive daily radiation treatments.  See further notes for details of today's visit.  Patient will return both Tuesday and Wednesday morning this week prior to her radiation treatments for recheck of the possible extravasation site.  Patient will also return on 05/09/2016 for labs, follow up visit, and her next cycle of chemotherapy.   Patient stated understanding of all instructions; and was in agreement with this plan of care. The patient knows to call the clinic with any problems,  questions or concerns.   Total time spent with patient was 15 minutes;  with greater than 75 percent of that time spent in face to face counseling regarding patient's symptoms,  and coordination of care and follow up.  Disclaimer:This dictation was prepared with Dragon/digital dictation along with [employer]. Any transcriptional errors that result from this process are unintentional.  Drue Second, NP 05/02/2016

## 2016-05-02 NOTE — Patient Instructions (Addendum)
[facility] Discharge Instructions for Patients Receiving Chemotherapy  Today you received the following chemotherapy agents:  Taxol and Carboplatin.  To help prevent nausea and vomiting after your treatment, we encourage you to take your nausea medication as directed.   If you develop nausea and vomiting that is not controlled by your nausea medication, call the clinic.   BELOW ARE SYMPTOMS THAT SHOULD BE REPORTED IMMEDIATELY:  *FEVER GREATER THAN 100.5 F  *CHILLS WITH OR WITHOUT FEVER  NAUSEA AND VOMITING THAT IS NOT CONTROLLED WITH YOUR NAUSEA MEDICATION  *UNUSUAL SHORTNESS OF BREATH  *UNUSUAL BRUISING OR BLEEDING  TENDERNESS IN MOUTH AND THROAT WITH OR WITHOUT PRESENCE OF ULCERS  *URINARY PROBLEMS  *BOWEL PROBLEMS  UNUSUAL RASH Items with * indicate a potential emergency and should be followed up as soon as possible.  Feel free to call the clinic you have any questions or concerns. The clinic phone number is [phone].  Please show the [facility] at check-in to the Emergency Department and triage nurse.  Paclitaxel injection What is this medicine? PACLITAXEL (PAK li TAX el) is a chemotherapy drug. It targets fast dividing cells, like cancer cells, and causes these cells to die. This medicine is used to treat ovarian cancer, breast cancer, and other cancers. This medicine may be used for other purposes; ask your health care provider or pharmacist if you have questions. COMMON BRAND NAME(S): Onxol, Taxol What should I tell my health care provider before I take this medicine? They need to know if you have any of these conditions: -blood disorders -irregular heartbeat -infection (especially a virus infection such as chickenpox, cold sores, or herpes) -liver disease -previous or ongoing radiation therapy -an unusual or allergic reaction to paclitaxel, alcohol, polyoxyethylated castor oil, other chemotherapy agents, other medicines, foods, dyes, or  preservatives -pregnant or trying to get pregnant -breast-feeding How should I use this medicine? This drug is given as an infusion into a vein. It is administered in a hospital or clinic by a specially trained health care professional. Talk to your pediatrician regarding the use of this medicine in children. Special care may be needed. Overdosage: If you think you have taken too much of this medicine contact a poison control center or emergency room at once. NOTE: This medicine is only for you. Do not share this medicine with others. What if I miss a dose? It is important not to miss your dose. Call your doctor or health care professional if you are unable to keep an appointment. What may interact with this medicine? Do not take this medicine with any of the following medications: -disulfiram -metronidazole This medicine may also interact with the following medications: -cyclosporine -diazepam -ketoconazole -medicines to increase blood counts like filgrastim, pegfilgrastim, sargramostim -other chemotherapy drugs like cisplatin, doxorubicin, epirubicin, etoposide, teniposide, vincristine -quinidine -testosterone -vaccines -verapamil Talk to your doctor or health care professional before taking any of these medicines: -acetaminophen -aspirin -ibuprofen -ketoprofen -naproxen This list may not describe all possible interactions. Give your health care provider a list of all the medicines, herbs, non-prescription drugs, or dietary supplements you use. Also tell them if you smoke, drink alcohol, or use illegal drugs. Some items may interact with your medicine. What should I watch for while using this medicine? Your condition will be monitored carefully while you are receiving this medicine. You will need important blood work done while you are taking this medicine. This medicine can cause serious allergic reactions. To reduce your risk you will need  to take other medicine(s) before  treatment with this medicine. If you experience allergic reactions like skin rash, itching or hives, swelling of the face, lips, or tongue, tell your doctor or health care professional right away. In some cases, you may be given additional medicines to help with side effects. Follow all directions for their use. This drug may make you feel generally unwell. This is not uncommon, as chemotherapy can affect healthy cells as well as cancer cells. Report any side effects. Continue your course of treatment even though you feel ill unless your doctor tells you to stop. Call your doctor or health care professional for advice if you get a fever, chills or sore throat, or other symptoms of a cold or flu. Do not treat yourself. This drug decreases your body's ability to fight infections. Try to avoid being around people who are sick. This medicine may increase your risk to bruise or bleed. Call your doctor or health care professional if you notice any unusual bleeding. Be careful brushing and flossing your teeth or using a toothpick because you may get an infection or bleed more easily. If you have any dental work done, tell your dentist you are receiving this medicine. Avoid taking products that contain aspirin, acetaminophen, ibuprofen, naproxen, or ketoprofen unless instructed by your doctor. These medicines may hide a fever. Do not become pregnant while taking this medicine. Women should inform their doctor if they wish to become pregnant or think they might be pregnant. There is a potential for serious side effects to an unborn child. Talk to your health care professional or pharmacist for more information. Do not breast-feed an infant while taking this medicine. Men are advised not to father a child while receiving this medicine. This product may contain alcohol. Ask your pharmacist or healthcare provider if this medicine contains alcohol. Be sure to tell all healthcare providers you are taking this medicine.  Certain medicines, like metronidazole and disulfiram, can cause an unpleasant reaction when taken with alcohol. The reaction includes flushing, headache, nausea, vomiting, sweating, and increased thirst. The reaction can last from 30 minutes to several hours. What side effects may I notice from receiving this medicine? Side effects that you should report to your doctor or health care professional as soon as possible: -allergic reactions like skin rash, itching or hives, swelling of the face, lips, or tongue -low blood counts - This drug may decrease the number of white blood cells, red blood cells and platelets. You may be at increased risk for infections and bleeding. -signs of infection - fever or chills, cough, sore throat, pain or difficulty passing urine -signs of decreased platelets or bleeding - bruising, pinpoint red spots on the skin, black, tarry stools, nosebleeds -signs of decreased red blood cells - unusually weak or tired, fainting spells, lightheadedness -breathing problems -chest pain -high or low blood pressure -mouth sores -nausea and vomiting -pain, swelling, redness or irritation at the injection site -pain, tingling, numbness in the hands or feet -slow or irregular heartbeat -swelling of the ankle, feet, hands Side effects that usually do not require medical attention (report to your doctor or health care professional if they continue or are bothersome): -bone pain -complete hair loss including hair on your head, underarms, pubic hair, eyebrows, and eyelashes -changes in the color of fingernails -diarrhea -loosening of the fingernails -loss of appetite -muscle or joint pain -red flush to skin -sweating This list may not describe all possible side effects. Call your doctor for medical advice  about side effects. You may report side effects to FDA at 1-800-FDA-1088. Where should I keep my medicine? This drug is given in a hospital or clinic and will not be stored at  home. NOTE: This sheet is a summary. It may not cover all possible information. If you have questions about this medicine, talk to your doctor, pharmacist, or health care provider.  2017 Elsevier/Gold Standard (2015-03-17 19:58:00)  Carboplatin injection What is this medicine? CARBOPLATIN (KAR boe pla tin) is a chemotherapy drug. It targets fast dividing cells, like cancer cells, and causes these cells to die. This medicine is used to treat ovarian cancer and many other cancers. This medicine may be used for other purposes; ask your health care provider or pharmacist if you have questions. COMMON BRAND NAME(S): Paraplatin What should I tell my health care provider before I take this medicine? They need to know if you have any of these conditions: -blood disorders -hearing problems -kidney disease -recent or ongoing radiation therapy -an unusual or allergic reaction to carboplatin, cisplatin, other chemotherapy, other medicines, foods, dyes, or preservatives -pregnant or trying to get pregnant -breast-feeding How should I use this medicine? This drug is usually given as an infusion into a vein. It is administered in a hospital or clinic by a specially trained health care professional. Talk to your pediatrician regarding the use of this medicine in children. Special care may be needed. Overdosage: If you think you have taken too much of this medicine contact a poison control center or emergency room at once. NOTE: This medicine is only for you. Do not share this medicine with others. What if I miss a dose? It is important not to miss a dose. Call your doctor or health care professional if you are unable to keep an appointment. What may interact with this medicine? -medicines for seizures -medicines to increase blood counts like filgrastim, pegfilgrastim, sargramostim -some antibiotics like amikacin, gentamicin, neomycin, streptomycin, tobramycin -vaccines Talk to your doctor or health  care professional before taking any of these medicines: -acetaminophen -aspirin -ibuprofen -ketoprofen -naproxen This list may not describe all possible interactions. Give your health care provider a list of all the medicines, herbs, non-prescription drugs, or dietary supplements you use. Also tell them if you smoke, drink alcohol, or use illegal drugs. Some items may interact with your medicine. What should I watch for while using this medicine? Your condition will be monitored carefully while you are receiving this medicine. You will need important blood work done while you are taking this medicine. This drug may make you feel generally unwell. This is not uncommon, as chemotherapy can affect healthy cells as well as cancer cells. Report any side effects. Continue your course of treatment even though you feel ill unless your doctor tells you to stop. In some cases, you may be given additional medicines to help with side effects. Follow all directions for their use. Call your doctor or health care professional for advice if you get a fever, chills or sore throat, or other symptoms of a cold or flu. Do not treat yourself. This drug decreases your body's ability to fight infections. Try to avoid being around people who are sick. This medicine may increase your risk to bruise or bleed. Call your doctor or health care professional if you notice any unusual bleeding. Be careful brushing and flossing your teeth or using a toothpick because you may get an infection or bleed more easily. If you have any dental work done, tell your dentist  you are receiving this medicine. Avoid taking products that contain aspirin, acetaminophen, ibuprofen, naproxen, or ketoprofen unless instructed by your doctor. These medicines may hide a fever. Do not become pregnant while taking this medicine. Women should inform their doctor if they wish to become pregnant or think they might be pregnant. There is a potential for serious  side effects to an unborn child. Talk to your health care professional or pharmacist for more information. Do not breast-feed an infant while taking this medicine. What side effects may I notice from receiving this medicine? Side effects that you should report to your doctor or health care professional as soon as possible: -allergic reactions like skin rash, itching or hives, swelling of the face, lips, or tongue -signs of infection - fever or chills, cough, sore throat, pain or difficulty passing urine -signs of decreased platelets or bleeding - bruising, pinpoint red spots on the skin, black, tarry stools, nosebleeds -signs of decreased red blood cells - unusually weak or tired, fainting spells, lightheadedness -breathing problems -changes in hearing -changes in vision -chest pain -high blood pressure -low blood counts - This drug may decrease the number of white blood cells, red blood cells and platelets. You may be at increased risk for infections and bleeding. -nausea and vomiting -pain, swelling, redness or irritation at the injection site -pain, tingling, numbness in the hands or feet -problems with balance, talking, walking -trouble passing urine or change in the amount of urine Side effects that usually do not require medical attention (report to your doctor or health care professional if they continue or are bothersome): -hair loss -loss of appetite -metallic taste in the mouth or changes in taste This list may not describe all possible side effects. Call your doctor for medical advice about side effects. You may report side effects to FDA at 1-800-FDA-1088. Where should I keep my medicine? This drug is given in a hospital or clinic and will not be stored at home. NOTE: This sheet is a summary. It may not cover all possible information. If you have questions about this medicine, talk to your doctor, pharmacist, or health care provider.  2017 Elsevier/Gold Standard (2007-08-21  14:38:05)

## 2016-05-02 NOTE — Progress Notes (Signed)
Patient ambulated to restroom during treatment.  Upon returning at 1120, she c/o "pressure or pain" at IV site (left posterior wrist).  Paused infusion.  Noted no erythema/edema.  + blood return.  Aspirated 5 mL from IV site.  Flushed with NS.  Patient c/o significant discomfort just above IV site.  2nd site obtained.  Ice pack applied.  Selena Lesser, NP notified.  Cyndee saw patient in infusion room.  Approximately 10 minutes after stopping infusion, noted edema above IV insertion site.  Patient c/o tenderness to palpation.  Reviewed patient education sheet with patient. Patient will RTC on 05/03/2016, 05/04/2016, and 05/09/2016 for site check.  Verbalized understanding of all instructions.

## 2016-05-03 ENCOUNTER — Telehealth: Payer: Self-pay | Admitting: *Deleted

## 2016-05-03 ENCOUNTER — Ambulatory Visit
Admission: RE | Admit: 2016-05-03 | Discharge: 2016-05-03 | Disposition: A | Discharge status: Home / Self Care | Payer: No Typology Code available for payment source | Source: Ambulatory Visit | Attending: Radiation Oncology | Admitting: Radiation Oncology

## 2016-05-03 ENCOUNTER — Ambulatory Visit (HOSPITAL_BASED_OUTPATIENT_CLINIC_OR_DEPARTMENT_OTHER): Payer: No Typology Code available for payment source

## 2016-05-03 DIAGNOSIS — Z510 Encounter for antineoplastic radiation therapy: Secondary | ICD-10-CM | POA: Diagnosis not present

## 2016-05-03 DIAGNOSIS — C3492 Malignant neoplasm of unspecified part of left bronchus or lung: Secondary | ICD-10-CM | POA: Diagnosis not present

## 2016-05-03 LAB — FUNGAL ORGANISM REFLEX

## 2016-05-03 LAB — FUNGUS CULTURE RESULT

## 2016-05-03 LAB — FUNGUS CULTURE WITH STAIN

## 2016-05-03 NOTE — Telephone Encounter (Signed)
Called pt -no concerns at this time, pt advised she would like to try PIV access Monday however if it does not go well, she would like a PAC. Discussed with pt to let her nurse in infusion know what she decides and we can go from there. Pt verbalized understanding no further concerns.

## 2016-05-03 NOTE — Telephone Encounter (Signed)
-----   Message from Sinda Du, RN sent at 05/02/2016 10:50 AM EST ----- Regarding: Dr. Julien Nordmann - 1st chemo f/u 1st chemo f/u

## 2016-05-03 NOTE — Progress Notes (Signed)
Patient presented to infusion for extravasation recheck.  No abnormalities noted.  Patient denied any complaints.  Patient will call clinic or return if symptoms should arise.

## 2016-05-04 ENCOUNTER — Telehealth: Payer: Self-pay | Admitting: *Deleted

## 2016-05-04 ENCOUNTER — Ambulatory Visit: Payer: No Typology Code available for payment source

## 2016-05-04 ENCOUNTER — Ambulatory Visit
Admission: RE | Admit: 2016-05-04 | Discharge: 2016-05-04 | Disposition: A | Discharge status: Home / Self Care | Payer: No Typology Code available for payment source | Source: Ambulatory Visit | Attending: Radiation Oncology | Admitting: Radiation Oncology

## 2016-05-04 DIAGNOSIS — Z510 Encounter for antineoplastic radiation therapy: Secondary | ICD-10-CM | POA: Diagnosis not present

## 2016-05-04 NOTE — Telephone Encounter (Signed)
Called pt relayed information on who she could speak with regarding her insurance concerns

## 2016-05-04 NOTE — Telephone Encounter (Signed)
-----   Message from Gaspar Bidding sent at 05/03/2016  2:55 PM EST ----- Regarding: RE: Pt radiation treatments denied by insurance She can talk with [place], Ailene Ravel or Frederika, [place] downstairs. ----- Message ----- From: Lucile Crater, RN Sent: 05/03/2016  11:42 AM To: Gaspar Bidding Subject: Pt radiation treatments denied by insurance    Pt called states she received a letter from Insurance that her radiation was denied.  Pt is coming daily for Radiation treatments. Do you know who could help her with this concern. Thanks, Stanton Kidney

## 2016-05-04 NOTE — [place] Notes (Signed)
Patient arrived to infusion for extravasation check. No abnormalities or changes noted. Patient denies any pain. Will call clinic or come in if any symptoms present.

## 2016-05-05 ENCOUNTER — Ambulatory Visit
Admission: RE | Admit: 2016-05-05 | Discharge: 2016-05-05 | Disposition: A | Discharge status: Home / Self Care | Payer: No Typology Code available for payment source | Source: Ambulatory Visit | Attending: Radiation Oncology | Admitting: Radiation Oncology

## 2016-05-05 VITALS — BP 100/72 | HR 72 | Resp 18 | Wt 197.0 lb

## 2016-05-05 DIAGNOSIS — C3492 Malignant neoplasm of unspecified part of left bronchus or lung: Secondary | ICD-10-CM

## 2016-05-05 DIAGNOSIS — Z510 Encounter for antineoplastic radiation therapy: Secondary | ICD-10-CM | POA: Diagnosis not present

## 2016-05-05 NOTE — Progress Notes (Signed)
  Radiation Oncology         [phone]   Name: Debbie Baker MRN: [id number]   Date: 05/05/2016  DOB: [birth year]     Weekly Radiation Therapy Management    ICD-9-CM ICD-10-CM   1. Adenocarcinoma of left lung, stage 3 (HCC) 162.9 C34.92     Current Dose: 8 Gy  Planned Dose:  66 Gy  Narrative The patient presents for routine under treatment assessment.  Weight and vitals stable. Denies pain. Reports persistent frontal headaches. Denies nausea, vomiting, or dizziness. Reports feeling foggy headed intermittently throughout the day. Reports constipation x 5 despite taking Senna x 2 days. Nursing encouraged the patient to take magnesium citrate for relief. Denies difficulty swallowing. Reports her cough was once productive with clear sputum but, now is dry. Reports SOB is unchanged. Reports inability to be comfortable when laying flat due to discomfort in chest that's affecting her ability to sleep. She hasn't noticed any changes in symptoms with radiation treatment.     Set-up films were reviewed. The chart was checked.  Physical Findings  weight is 197 lb (89.4 kg). Her blood pressure is 100/72 and her pulse is 72. Her respiration is 18 and oxygen saturation is 100%. . Weight essentially stable.  No significant changes. Alert, in no acute distress.  Impression The patient is tolerating radiation.  Plan Continue treatment as planned. I advised the patient to try Tylenol or Advil for headaches while staying hydrated.     Sheral Apley Tammi Klippel, M.D.  This document serves as a record of services personally performed by Tyler Pita, MD. It was created on his behalf by Arlyce Harman, a trained medical scribe. The creation of this record is based on the scribe's personal observations and the provider's statements to them. This document has been checked and approved by the attending provider.

## 2016-05-05 NOTE — [place] Notes (Signed)
Weight and vitals stable. Denies pain. Reports persistent frontal headaches. Denies nausea, vomiting, or dizziness. Reports feeling foggy headed intermittently throughout the day. Reports constipation x 5 despite taking Senna x 2 days. Encouraged patient to take magnesium citrate for relief. Denies difficulty swallowing. Reports her cough was once productive with clear sputum but, now is dry. Reports SOB is unchanged. Reports inability to be comfortable when laying flat due to discomfort in chest that's affecting her ability to sleep.   BP 100/72 (BP Location: Left Arm, Patient Position: Sitting, Cuff Size: Normal)   Pulse 72   Resp 18   Wt 197 lb (89.4 kg)   SpO2 100%   BMI 29.95 kg/m  Wt Readings from Last 3 Encounters:  05/05/16 197 lb (89.4 kg)  04/25/16 195 lb 12.8 oz (88.8 kg)  04/18/16 198 lb 14.4 oz (90.2 kg)

## 2016-05-06 ENCOUNTER — Ambulatory Visit
Admission: RE | Admit: 2016-05-06 | Discharge: 2016-05-06 | Disposition: A | Discharge status: Home / Self Care | Payer: No Typology Code available for payment source | Source: Ambulatory Visit | Attending: Radiation Oncology | Admitting: Radiation Oncology

## 2016-05-06 DIAGNOSIS — C3492 Malignant neoplasm of unspecified part of left bronchus or lung: Secondary | ICD-10-CM

## 2016-05-06 DIAGNOSIS — Z510 Encounter for antineoplastic radiation therapy: Secondary | ICD-10-CM | POA: Diagnosis not present

## 2016-05-06 MED ORDER — RADIAPLEXRX EX GEL
Freq: Once | CUTANEOUS | Status: AC
  Administered 2016-05-06: 10:00:00 via TOPICAL

## 2016-05-06 NOTE — Addendum Note (Signed)
Encounter addended by: Heywood Footman, RN on: 05/06/2016 10:16 AM<BR>    Actions taken: Patient Education assessment filed

## 2016-05-09 ENCOUNTER — Ambulatory Visit
Admission: RE | Admit: 2016-05-09 | Discharge: 2016-05-09 | Disposition: A | Discharge status: Home / Self Care | Payer: No Typology Code available for payment source | Source: Ambulatory Visit | Attending: Radiation Oncology | Admitting: Radiation Oncology

## 2016-05-09 ENCOUNTER — Telehealth: Payer: Self-pay | Admitting: Internal Medicine

## 2016-05-09 ENCOUNTER — Other Ambulatory Visit (HOSPITAL_BASED_OUTPATIENT_CLINIC_OR_DEPARTMENT_OTHER): Payer: No Typology Code available for payment source

## 2016-05-09 ENCOUNTER — Ambulatory Visit (HOSPITAL_BASED_OUTPATIENT_CLINIC_OR_DEPARTMENT_OTHER): Payer: No Typology Code available for payment source

## 2016-05-09 ENCOUNTER — Encounter: Payer: Self-pay | Admitting: Internal Medicine

## 2016-05-09 ENCOUNTER — Ambulatory Visit (HOSPITAL_BASED_OUTPATIENT_CLINIC_OR_DEPARTMENT_OTHER): Payer: No Typology Code available for payment source | Admitting: Internal Medicine

## 2016-05-09 VITALS — BP 105/64 | HR 70 | Temp 98.6°F | Resp 18 | Ht 68.0 in | Wt 195.6 lb

## 2016-05-09 DIAGNOSIS — C3492 Malignant neoplasm of unspecified part of left bronchus or lung: Secondary | ICD-10-CM

## 2016-05-09 DIAGNOSIS — Z510 Encounter for antineoplastic radiation therapy: Secondary | ICD-10-CM | POA: Diagnosis not present

## 2016-05-09 DIAGNOSIS — Z5111 Encounter for antineoplastic chemotherapy: Secondary | ICD-10-CM | POA: Diagnosis not present

## 2016-05-09 LAB — COMPREHENSIVE METABOLIC PANEL
ALT: 76 U/L — ABNORMAL HIGH (ref 0–55)
AST: 36 U/L — AB (ref 5–34)
Albumin: 3.9 g/dL (ref 3.5–5.0)
Alkaline Phosphatase: 98 U/L (ref 40–150)
Anion Gap: 9 mEq/L (ref 3–11)
BUN: 8.4 mg/dL (ref 7.0–26.0)
CALCIUM: 9.8 mg/dL (ref 8.4–10.4)
CHLORIDE: 106 meq/L (ref 98–109)
CO2: 24 mEq/L (ref 22–29)
Creatinine: 0.8 mg/dL (ref 0.6–1.1)
GLUCOSE: 90 mg/dL (ref 70–140)
POTASSIUM: 4.2 meq/L (ref 3.5–5.1)
SODIUM: 140 meq/L (ref 136–145)
Total Bilirubin: 0.56 mg/dL (ref 0.20–1.20)
Total Protein: 7.5 g/dL (ref 6.4–8.3)

## 2016-05-09 LAB — CBC WITH DIFFERENTIAL/PLATELET
BASO%: 1.1 % (ref 0.0–2.0)
BASOS ABS: 0.1 10*3/uL (ref 0.0–0.1)
EOS%: 2.1 % (ref 0.0–7.0)
Eosinophils Absolute: 0.1 10*3/uL (ref 0.0–0.5)
HCT: 43.4 % (ref 34.8–46.6)
HGB: 14.3 g/dL (ref 11.6–15.9)
LYMPH%: 36 % (ref 14.0–49.7)
MCH: 31 pg (ref 25.1–34.0)
MCHC: 33 g/dL (ref 31.5–36.0)
MCV: 94.1 fL (ref 79.5–101.0)
MONO#: 0.5 10*3/uL (ref 0.1–0.9)
MONO%: 8.7 % (ref 0.0–14.0)
NEUT#: 3 10*3/uL (ref 1.5–6.5)
NEUT%: 52.1 % (ref 38.4–76.8)
Platelets: 271 10*3/uL (ref 145–400)
RBC: 4.61 10*6/uL (ref 3.70–5.45)
RDW: 12.4 % (ref 11.2–14.5)
WBC: 5.7 10*3/uL (ref 3.9–10.3)
lymph#: 2.1 10*3/uL (ref 0.9–3.3)

## 2016-05-09 MED ORDER — PALONOSETRON HCL INJECTION 0.25 MG/5ML
INTRAVENOUS | Status: AC
  Filled 2016-05-09: qty 5

## 2016-05-09 MED ORDER — FAMOTIDINE IN NACL 20-0.9 MG/50ML-% IV SOLN
20.0000 mg | Freq: Once | INTRAVENOUS | Status: AC
  Administered 2016-05-09: 20 mg via INTRAVENOUS

## 2016-05-09 MED ORDER — SODIUM CHLORIDE 0.9 % IV SOLN
45.0000 mg/m2 | Freq: Once | INTRAVENOUS | Status: AC
  Administered 2016-05-09: 96 mg via INTRAVENOUS
  Filled 2016-05-09: qty 16

## 2016-05-09 MED ORDER — SODIUM CHLORIDE 0.9 % IV SOLN
284.2000 mg | Freq: Once | INTRAVENOUS | Status: AC
  Administered 2016-05-09: 280 mg via INTRAVENOUS
  Filled 2016-05-09: qty 28

## 2016-05-09 MED ORDER — DIPHENHYDRAMINE HCL 50 MG/ML IJ SOLN
INTRAMUSCULAR | Status: AC
  Filled 2016-05-09: qty 1

## 2016-05-09 MED ORDER — PALONOSETRON HCL INJECTION 0.25 MG/5ML
0.2500 mg | Freq: Once | INTRAVENOUS | Status: AC
  Administered 2016-05-09: 0.25 mg via INTRAVENOUS

## 2016-05-09 MED ORDER — DIPHENHYDRAMINE HCL 50 MG/ML IJ SOLN
50.0000 mg | Freq: Once | INTRAMUSCULAR | Status: AC
Start: 2016-05-09 — End: 2016-05-09
  Administered 2016-05-09: 50 mg via INTRAVENOUS

## 2016-05-09 MED ORDER — SODIUM CHLORIDE 0.9 % IV SOLN
Freq: Once | INTRAVENOUS | Status: AC
  Administered 2016-05-09: 14:00:00 via INTRAVENOUS

## 2016-05-09 MED ORDER — FAMOTIDINE IN NACL 20-0.9 MG/50ML-% IV SOLN
INTRAVENOUS | Status: AC
  Filled 2016-05-09: qty 50

## 2016-05-09 MED ORDER — SODIUM CHLORIDE 0.9 % IV SOLN
20.0000 mg | Freq: Once | INTRAVENOUS | Status: AC
  Administered 2016-05-09: 20 mg via INTRAVENOUS
  Filled 2016-05-09: qty 2

## 2016-05-09 NOTE — Patient Instructions (Signed)
[facility] Discharge Instructions for Patients Receiving Chemotherapy  Today you received the following chemotherapy agents Taxol and Carboplatin  To help prevent nausea and vomiting after your treatment, we encourage you to take your nausea medication as directed. No Zofran for 3 days. Take Compazine instead.   If you develop nausea and vomiting that is not controlled by your nausea medication, call the clinic.   BELOW ARE SYMPTOMS THAT SHOULD BE REPORTED IMMEDIATELY:  *FEVER GREATER THAN 100.5 F  *CHILLS WITH OR WITHOUT FEVER  NAUSEA AND VOMITING THAT IS NOT CONTROLLED WITH YOUR NAUSEA MEDICATION  *UNUSUAL SHORTNESS OF BREATH  *UNUSUAL BRUISING OR BLEEDING  TENDERNESS IN MOUTH AND THROAT WITH OR WITHOUT PRESENCE OF ULCERS  *URINARY PROBLEMS  *BOWEL PROBLEMS  UNUSUAL RASH Items with * indicate a potential emergency and should be followed up as soon as possible.  Feel free to call the clinic you have any questions or concerns. The clinic phone number is [phone].  Please show the CHEMO ALERT CARD at check-in to the Emergency Department and triage nurse.   

## 2016-05-09 NOTE — Telephone Encounter (Signed)
Appointments scheduled per 12/11 LOS. Patient given AVS report and calendars with future scheduled appointments.

## 2016-05-09 NOTE — [place] (Signed)
[place] [phone]   [phone]  OFFICE PROGRESS NOTE  Gennette Pac, MD [place] [address]  DIAGNOSIS: Stage IIIA (T2a, N2, M0) non-small cell lung cancer, adenocarcinoma presented with left suprahilar mass, mediastinal lymphadenopathy and suspicious pulmonary nodules in the left upper lobe diagnosed in November 2017.  GUARDANT TEST: No actionable mutations.  PRIOR THERAPY: None  CURRENT THERAPY: Concurrent chemoradiation with weekly carboplatin for AUC of 2 and paclitaxel 45 MG/M2. Status post one cycle  INTERVAL HISTORY: Debbie Baker 50 y.o. female returns to the clinic today for follow up visit. She started concurrent chemoradiation last week. She tolerated the first week of her treatment well. She denied having significant nausea or vomiting. She had no fever or chills. The patient has no significant weight loss or night sweats. She has no bleeding issues she had molecular studies done by [facility] and unfortunately showed no action would mutations. She had MRI of the brain that showed no evidence for metastatic disease to the brain. She is here today for evaluation before starting cycle #2 of her treatment.  MEDICAL HISTORY: Past Medical History:  Diagnosis Date  . Allergic rhinitis   . Deaf, left   . Diverticulosis   . Dyspnea   . Encounter for antineoplastic chemotherapy 04/18/2016  . GERD (gastroesophageal reflux disease)   . Hyperlipidemia   . Lung cancer ([place])    non small cell lung cancer favoring adenocarcinoma   . Lung nodules   . Migraine   . Pneumonia   . PONV (postoperative nausea and vomiting)   . Wears glasses     ALLERGIES:  is allergic to penicillins; chantix [varenicline]; and levaquin [levofloxacin].  MEDICATIONS:  Current Outpatient Prescriptions  Medication Sig Dispense Refill  . benzonatate (TESSALON) 100 MG capsule Take 1 capsule (100 mg total) by mouth every 6 (six) hours  as needed for cough. 30 capsule 1  . Calcium-Magnesium-Vitamin D 185-50-100 MG-MG-UNIT CAPS Take 1 tablet by mouth every evening.    . cetirizine (ZYRTEC) 10 MG tablet Take 10 mg by mouth daily.    Marland Kitchen ELDERBERRY PO Take 2 tablets by mouth daily. Chewable Supplement    . Misc Natural Products (BLACK COHOSH MENOPAUSE COMPLEX) TABS Take 1 capsule by mouth 2 (two) times daily.    . Misc Natural Products (TURMERIC CURCUMIN) CAPS Take 1 capsule by mouth every evening. 1500 mg capsule    . Multiple Vitamins-Minerals (WOMENS 50+ MULTI VITAMIN/MIN) TABS Take 1 tablet by mouth daily.    . naproxen sodium (ANAPROX) 220 MG tablet Take 220-440 mg by mouth 2 (two) times daily as needed (for pain).     . Omega-3 Fatty Acids (FISH OIL) 1000 MG CAPS Take 2,000 mg by mouth 2 (two) times daily.    Marland Kitchen OVER THE COUNTER MEDICATION Take 250 mg by mouth daily. Jiaogulan Supplement    . pantoprazole (PROTONIX) 40 MG tablet Take 1 tablet (40 mg total) by mouth every evening.    . pravastatin (PRAVACHOL) 20 MG tablet Take 20 mg by mouth every evening.  3  . prochlorperazine (COMPAZINE) 10 MG tablet Take 1 tablet (10 mg total) by mouth every 6 (six) hours as needed for nausea or vomiting. 30 tablet 0  . ranitidine (ZANTAC) 150 MG tablet Take 150-300 mg by mouth every evening. 300 mg with more acidic meals    . Wound Cleansers (RADIAPLEX EX) Apply topically.     No current facility-administered medications  for this visit.     SURGICAL HISTORY:  Past Surgical History:  Procedure Laterality Date  . ABDOMINAL HYSTERECTOMY    . VIDEO BRONCHOSCOPY Bilateral 07/03/2015   Procedure: VIDEO BRONCHOSCOPY WITH FLUORO;  Surgeon: Marshell Garfinkel, MD;  Location: [facility];  Service: Cardiopulmonary;  Laterality: Bilateral;  . VIDEO BRONCHOSCOPY WITH ENDOBRONCHIAL ULTRASOUND N/A 04/06/2016   Procedure: VIDEO BRONCHOSCOPY WITH ENDOBRONCHIAL ULTRASOUND;  Surgeon: Collene Gobble, MD;  Location: [facility];  Service: Thoracic;  Laterality: N/A;    . WRIST FRACTURE SURGERY      REVIEW OF SYSTEMS:  Constitutional: negative Eyes: negative Ears, nose, mouth, throat, and face: negative Respiratory: negative Cardiovascular: negative Gastrointestinal: negative Genitourinary:negative Integument/breast: negative Hematologic/lymphatic: negative Musculoskeletal:negative Neurological: negative Behavioral/Psych: negative Endocrine: negative Allergic/Immunologic: negative   PHYSICAL EXAMINATION: General appearance: alert, cooperative, appears stated age and no distress Head: Normocephalic, without obvious abnormality, atraumatic Neck: no adenopathy, no JVD, supple, symmetrical, trachea midline and thyroid not enlarged, symmetric, no tenderness/mass/nodules Lymph nodes: Cervical, supraclavicular, and axillary nodes normal. Resp: clear to auscultation bilaterally Back: symmetric, no curvature. ROM normal. No CVA tenderness. Cardio: regular rate and rhythm, S1, S2 normal, no murmur, click, rub or gallop GI: soft, non-tender; bowel sounds normal; no masses,  no organomegaly Extremities: extremities normal, atraumatic, no cyanosis or edema Neurologic: Alert and oriented X 3, normal strength and tone. Normal symmetric reflexes. Normal coordination and gait  ECOG PERFORMANCE STATUS: 1 - Symptomatic but completely ambulatory  Blood pressure 105/64, pulse 70, temperature 98.6 F (37 C), temperature source Oral, resp. rate 18, height '5\' 8"'$  (1.727 m), weight 195 lb 9.6 oz (88.7 kg), SpO2 95 %.  LABORATORY DATA: Lab Results  Component Value Date   WBC 5.7 05/09/2016   HGB 14.3 05/09/2016   HCT 43.4 05/09/2016   MCV 94.1 05/09/2016   PLT 271 05/09/2016      Chemistry      Component Value Date/Time   NA 140 05/09/2016 1042   K 4.2 05/09/2016 1042   CL 107 04/06/2016 0742   CO2 24 05/09/2016 1042   BUN 8.4 05/09/2016 1042   CREATININE 0.8 05/09/2016 1042      Component Value Date/Time   CALCIUM 9.8 05/09/2016 1042   ALKPHOS 98  05/09/2016 1042   AST 36 (H) 05/09/2016 1042   ALT 76 (H) 05/09/2016 1042   BILITOT 0.56 05/09/2016 1042       RADIOGRAPHIC STUDIES: Mr Jeri Cos DX Contrast  Result Date: 04/23/2016 CLINICAL DATA:  50 year old female recently diagnosed with stage III left lung adenocarcinoma. Staging. Subsequent encounter. EXAM: MRI HEAD WITHOUT AND WITH CONTRAST TECHNIQUE: Multiplanar, multiecho pulse sequences of the brain and surrounding structures were obtained without and with intravenous contrast. CONTRAST:  16 mL MultiHance COMPARISON:  Face CT 03/21/2016.  Brain MRI 01/27/2004. FINDINGS: Brain: There is a small right operculum developmental venous anomaly (normal variant series 12, image 33) which as expected is unchanged since 2005. No abnormal enhancement identified. No midline shift, mass effect, or evidence of intracranial mass lesion. No dural thickening. No restricted diffusion to suggest acute infarction. No ventriculomegaly, extra-axial collection or acute intracranial hemorrhage. Cervicomedullary junction and pituitary are within normal limits. Pearline Cables and white matter signal is within normal limits for age throughout the brain. No encephalomalacia or definite chronic cerebral blood products. Vascular: Major intracranial vascular flow voids are stable since 2005 and within normal limits. Skull and upper cervical spine: Negative visualized cervical spinal cord. Visible bone marrow signal is stable and within normal limits. Sinuses/Orbits:  Stable and negative. Minimal paranasal sinus mucosal thickening. Other: Visible internal auditory structures appear normal. Mastoids are clear. Negative scalp soft tissues. IMPRESSION: No metastatic disease or acute intracranial abnormality. Stable and normal MRI appearance of the brain. Electronically Signed   By: Genevie Ann M.D.   On: 04/23/2016 14:49    ASSESSMENT AND PLAN: This is a very pleasant 50 years old white female recently diagnosed with a stage IIIa non-small  cell lung cancer, adenocarcinoma with no actionable mutations. The patient is currently undergoing a course of concurrent chemoradiation with weekly carboplatin for AUC of 2 and paclitaxel 45 MG/M2 status post 1 cycle. She tolerated the first cycle of her treatment well with no significant adverse effects She had a recent MRI of the brain that showed no evidence for metastatic disease to the brain. She also has molecular studies that showed no evidence for targeted mutations. I discussed the results with the patient today. I recommended for her to proceed with the course of concurrent chemoradiation and she will receive cycle #2 today. For nausea she will continue on Compazine as needed. I would see the patient back for follow-up visit in 2 weeks for evaluation and management of any adverse effect of her treatment. She was advised to call immediately if she has any concerning symptoms in the interval. The patient voices understanding of current disease status and treatment options and is in agreement with the current care plan.  All questions were answered. The patient knows to call the clinic with any problems, questions or concerns. We can certainly see the patient much sooner if necessary.  I spent 15 minutes counseling the patient face to face. The total time spent in the appointment was 25 minutes.  Disclaimer: This note was dictated with voice recognition software. Similar sounding words can inadvertently be transcribed and may not be corrected upon review.

## 2016-05-10 ENCOUNTER — Telehealth: Payer: Self-pay | Admitting: *Deleted

## 2016-05-10 ENCOUNTER — Ambulatory Visit
Admission: RE | Admit: 2016-05-10 | Discharge: 2016-05-10 | Disposition: A | Discharge status: Home / Self Care | Payer: No Typology Code available for payment source | Source: Ambulatory Visit | Attending: Radiation Oncology | Admitting: Radiation Oncology

## 2016-05-10 DIAGNOSIS — Z510 Encounter for antineoplastic radiation therapy: Secondary | ICD-10-CM | POA: Diagnosis not present

## 2016-05-10 NOTE — Telephone Encounter (Signed)
Per LOS I have scheduled appts and notified the scheduler 

## 2016-05-11 ENCOUNTER — Ambulatory Visit
Admission: RE | Admit: 2016-05-11 | Discharge: 2016-05-11 | Disposition: A | Discharge status: Home / Self Care | Payer: No Typology Code available for payment source | Source: Ambulatory Visit | Attending: Radiation Oncology | Admitting: Radiation Oncology

## 2016-05-11 DIAGNOSIS — Z510 Encounter for antineoplastic radiation therapy: Secondary | ICD-10-CM | POA: Diagnosis not present

## 2016-05-12 ENCOUNTER — Encounter: Payer: Self-pay | Admitting: General Practice

## 2016-05-12 ENCOUNTER — Ambulatory Visit
Admission: RE | Admit: 2016-05-12 | Discharge: 2016-05-12 | Disposition: A | Discharge status: Home / Self Care | Payer: No Typology Code available for payment source | Source: Ambulatory Visit | Attending: Radiation Oncology | Admitting: Radiation Oncology

## 2016-05-12 DIAGNOSIS — Z510 Encounter for antineoplastic radiation therapy: Secondary | ICD-10-CM | POA: Diagnosis not present

## 2016-05-12 NOTE — [place] Notes (Signed)
[facility] Spiritual Care Note  Followed up with Debbie Baker by phone to offer further care and encouragement.  She stated briefly that attending her son's memorial service in [facility] last weekend was both hard (as expected) and helpful for processing and closure; having that experience prior to starting treatment seems to have helped her shift her focus from the immediate raw grief to forward-looking deep self-care.  We will continue to talk about grief, healing, cancer, outlook, and support resources.  We plan for me to f/u in infusion on Monday 12/18.   [place], [place], [facility] Pager [phone] Voicemail [phone]

## 2016-05-13 ENCOUNTER — Ambulatory Visit
Admission: RE | Admit: 2016-05-13 | Discharge: 2016-05-13 | Disposition: A | Discharge status: Home / Self Care | Payer: No Typology Code available for payment source | Source: Ambulatory Visit | Attending: Radiation Oncology | Admitting: Radiation Oncology

## 2016-05-13 VITALS — BP 106/88 | HR 82 | Temp 98.2°F | Resp 16 | Wt 192.4 lb

## 2016-05-13 DIAGNOSIS — Z510 Encounter for antineoplastic radiation therapy: Secondary | ICD-10-CM | POA: Diagnosis not present

## 2016-05-13 DIAGNOSIS — C3492 Malignant neoplasm of unspecified part of left bronchus or lung: Secondary | ICD-10-CM

## 2016-05-13 DIAGNOSIS — K209 Esophagitis, unspecified without bleeding: Secondary | ICD-10-CM

## 2016-05-13 MED ORDER — SUCRALFATE 1 G PO TABS: 1.0000 g | ORAL_TABLET | Freq: Three times a day (TID) | ORAL | 2 refills | Status: DC

## 2016-05-13 NOTE — [place] Notes (Signed)
Weight and vitals stable. Denies pain. Reports productive cough with scant hemoptysis yesterday. Reports today hemoptysis was more and thicker. Reports shortness of breath is unchanged. Reports difficulty swallowing began yesterday. Hyperpigmentation of left upper back without desquamation noted. Reports using radiaplex bid as directed. Reports bilateral shoulder stiffness worse on the right side.   BP 106/88 (BP Location: Left Arm, Patient Position: Sitting, Cuff Size: Normal)   Pulse 82   Temp 98.2 F (36.8 C) (Oral)   Resp 16   Wt 192 lb 6.4 oz (87.3 kg)   SpO2 98%   BMI 29.25 kg/m   Wt Readings from Last 3 Encounters:  05/13/16 192 lb 6.4 oz (87.3 kg)  05/09/16 195 lb 9.6 oz (88.7 kg)  05/05/16 197 lb (89.4 kg)

## 2016-05-13 NOTE — [facility] Notes (Signed)
  Radiation Oncology         [phone]   Name: Debbie Baker MRN: [id number]   Date: 05/13/2016  DOB: [birth year]     Weekly Radiation Therapy Management    ICD-9-CM ICD-10-CM   1. Acute esophagitis 530.12 K20.9 sucralfate (CARAFATE) 1 g tablet  2. Adenocarcinoma of left lung, stage 3 (HCC) 162.9 C34.92     Current Dose: 20 Gy  Planned Dose:  66 Gy  Narrative The patient presents for routine under treatment assessment.  Denies pain. Reports productive cough with scant hemoptysis yesterday. She reports more hemoptysis today that was a bit thicker. Reports shortness of breath is unchanged. Reports difficulty swallowing began yesterday. Hyperpigmentation of the left upper back without desquamation noted by the nurse. Reports using radiaplex BID as directed. Reports bilateral shoulder stiffness worse on the right side. She also reports some chest discomfort.  Set-up films were reviewed. The chart was checked.  Physical Findings  weight is 192 lb 6.4 oz (87.3 kg). Her oral temperature is 98.2 F (36.8 C). Her blood pressure is 106/88 and her pulse is 82. Her respiration is 16 and oxygen saturation is 98%. . 6 lb weight loss since 04/18/16. No significant changes. Alert, in no acute distress.  Impression The patient is tolerating radiation. I advised the patient to monitor her temperature to make sure she does not develop an infection and to notify us if she does develop a fever. I also advised the patient to keep up her nutrition and to shun acidic and hot foods that may cause irritation.  Plan Continue treatment as planned. I will prescribe carafate.     Sheral Apley Tammi Klippel, M.D.  This document serves as a record of services personally performed by Tyler Pita, MD. It was created on his behalf by Darcus Austin, a trained medical scribe. The creation of this record is based on the scribe's personal observations and the provider's statements to them. This document has been checked and  approved by the attending provider.

## 2016-05-16 ENCOUNTER — Ambulatory Visit
Admission: RE | Admit: 2016-05-16 | Discharge: 2016-05-16 | Disposition: A | Discharge status: Home / Self Care | Payer: No Typology Code available for payment source | Source: Ambulatory Visit | Attending: Radiation Oncology | Admitting: Radiation Oncology

## 2016-05-16 ENCOUNTER — Encounter: Payer: Self-pay | Admitting: General Practice

## 2016-05-16 ENCOUNTER — Ambulatory Visit (HOSPITAL_BASED_OUTPATIENT_CLINIC_OR_DEPARTMENT_OTHER): Payer: No Typology Code available for payment source

## 2016-05-16 ENCOUNTER — Other Ambulatory Visit (HOSPITAL_BASED_OUTPATIENT_CLINIC_OR_DEPARTMENT_OTHER): Payer: No Typology Code available for payment source

## 2016-05-16 VITALS — BP 103/78 | HR 83 | Temp 98.6°F | Resp 16

## 2016-05-16 DIAGNOSIS — C3492 Malignant neoplasm of unspecified part of left bronchus or lung: Secondary | ICD-10-CM | POA: Diagnosis not present

## 2016-05-16 DIAGNOSIS — Z5111 Encounter for antineoplastic chemotherapy: Secondary | ICD-10-CM | POA: Diagnosis not present

## 2016-05-16 DIAGNOSIS — Z510 Encounter for antineoplastic radiation therapy: Secondary | ICD-10-CM | POA: Diagnosis not present

## 2016-05-16 LAB — CBC WITH DIFFERENTIAL/PLATELET
BASO%: 1.1 % (ref 0.0–2.0)
Basophils Absolute: 0.1 10*3/uL (ref 0.0–0.1)
EOS%: 1.7 % (ref 0.0–7.0)
Eosinophils Absolute: 0.1 10*3/uL (ref 0.0–0.5)
HCT: 41.7 % (ref 34.8–46.6)
HGB: 13.9 g/dL (ref 11.6–15.9)
LYMPH%: 29 % (ref 14.0–49.7)
MCH: 31.4 pg (ref 25.1–34.0)
MCHC: 33.3 g/dL (ref 31.5–36.0)
MCV: 94.2 fL (ref 79.5–101.0)
MONO#: 0.4 10*3/uL (ref 0.1–0.9)
MONO%: 7.8 % (ref 0.0–14.0)
NEUT#: 3 10*3/uL (ref 1.5–6.5)
NEUT%: 60.4 % (ref 38.4–76.8)
Platelets: 227 10*3/uL (ref 145–400)
RBC: 4.43 10*6/uL (ref 3.70–5.45)
RDW: 12.4 % (ref 11.2–14.5)
WBC: 4.9 10*3/uL (ref 3.9–10.3)
lymph#: 1.4 10*3/uL (ref 0.9–3.3)

## 2016-05-16 LAB — COMPREHENSIVE METABOLIC PANEL
ALT: 40 U/L (ref 0–55)
AST: 20 U/L (ref 5–34)
Albumin: 3.5 g/dL (ref 3.5–5.0)
Alkaline Phosphatase: 83 U/L (ref 40–150)
Anion Gap: 9 mEq/L (ref 3–11)
BUN: 5.8 mg/dL — AB (ref 7.0–26.0)
CHLORIDE: 108 meq/L (ref 98–109)
CO2: 24 meq/L (ref 22–29)
CREATININE: 0.7 mg/dL (ref 0.6–1.1)
Calcium: 9.4 mg/dL (ref 8.4–10.4)
EGFR: >90 mL/min/{1.73_m2} (ref >90)
Glucose: 102 mg/dl (ref 70–140)
POTASSIUM: 4.1 meq/L (ref 3.5–5.1)
Sodium: 140 mEq/L (ref 136–145)
Total Bilirubin: 0.5 mg/dL (ref 0.20–1.20)
Total Protein: 6.9 g/dL (ref 6.4–8.3)

## 2016-05-16 MED ORDER — PALONOSETRON HCL INJECTION 0.25 MG/5ML
0.2500 mg | Freq: Once | INTRAVENOUS | Status: AC
  Administered 2016-05-16: 0.25 mg via INTRAVENOUS

## 2016-05-16 MED ORDER — SODIUM CHLORIDE 0.9 % IV SOLN: 284.2000 mg | Freq: Once | INTRAVENOUS | Status: DC

## 2016-05-16 MED ORDER — SODIUM CHLORIDE 0.9 % IV SOLN
284.2000 mg | Freq: Once | INTRAVENOUS | Status: AC
  Administered 2016-05-16: 280 mg via INTRAVENOUS
  Filled 2016-05-16: qty 28

## 2016-05-16 MED ORDER — FAMOTIDINE IN NACL 20-0.9 MG/50ML-% IV SOLN
INTRAVENOUS | Status: AC
  Filled 2016-05-16: qty 50

## 2016-05-16 MED ORDER — DIPHENHYDRAMINE HCL 50 MG/ML IJ SOLN
INTRAMUSCULAR | Status: AC
Start: 2016-05-16 — End: 2016-05-16
  Filled 2016-05-16: qty 1

## 2016-05-16 MED ORDER — FAMOTIDINE IN NACL 20-0.9 MG/50ML-% IV SOLN
20.0000 mg | Freq: Once | INTRAVENOUS | Status: AC
  Administered 2016-05-16: 20 mg via INTRAVENOUS

## 2016-05-16 MED ORDER — PACLITAXEL CHEMO INJECTION 300 MG/50ML
45.0000 mg/m2 | Freq: Once | INTRAVENOUS | Status: AC
  Administered 2016-05-16: 96 mg via INTRAVENOUS
  Filled 2016-05-16: qty 16

## 2016-05-16 MED ORDER — DIPHENHYDRAMINE HCL 50 MG/ML IJ SOLN
50.0000 mg | Freq: Once | INTRAMUSCULAR | Status: AC
  Administered 2016-05-16: 50 mg via INTRAVENOUS

## 2016-05-16 MED ORDER — SODIUM CHLORIDE 0.9 % IV SOLN
20.0000 mg | Freq: Once | INTRAVENOUS | Status: AC
  Administered 2016-05-16: 20 mg via INTRAVENOUS
  Filled 2016-05-16: qty 2

## 2016-05-16 MED ORDER — SODIUM CHLORIDE 0.9 % IV SOLN
Freq: Once | INTRAVENOUS | Status: AC
  Administered 2016-05-16: 09:00:00 via INTRAVENOUS

## 2016-05-16 MED ORDER — PALONOSETRON HCL INJECTION 0.25 MG/5ML
INTRAVENOUS | Status: AC
  Filled 2016-05-16: qty 5

## 2016-05-16 NOTE — Patient Instructions (Signed)
[facility] Discharge Instructions for Patients Receiving Chemotherapy  Today you received the following chemotherapy agents Taxol and Carboplatin  To help prevent nausea and vomiting after your treatment, we encourage you to take your nausea medication as directed. No Zofran for 3 days. Take Compazine instead.   If you develop nausea and vomiting that is not controlled by your nausea medication, call the clinic.   BELOW ARE SYMPTOMS THAT SHOULD BE REPORTED IMMEDIATELY:  *FEVER GREATER THAN 100.5 F  *CHILLS WITH OR WITHOUT FEVER  NAUSEA AND VOMITING THAT IS NOT CONTROLLED WITH YOUR NAUSEA MEDICATION  *UNUSUAL SHORTNESS OF BREATH  *UNUSUAL BRUISING OR BLEEDING  TENDERNESS IN MOUTH AND THROAT WITH OR WITHOUT PRESENCE OF ULCERS  *URINARY PROBLEMS  *BOWEL PROBLEMS  UNUSUAL RASH Items with * indicate a potential emergency and should be followed up as soon as possible.  Feel free to call the clinic you have any questions or concerns. The clinic phone number is [phone].  Please show the CHEMO ALERT CARD at check-in to the Emergency Department and triage nurse.   

## 2016-05-16 NOTE — [place] Notes (Signed)
[facility] Spiritual Care Note  Followed up in infusion today.  Debbie Baker was very appreciative and sleepy; we plan for me to f/u by phone later in the week, when she might be more able to connect constructively.  As always, please also page if immediate needs arise. Thank you.   [place], [place], [facility] Pager [phone] Voicemail [phone]

## 2016-05-17 ENCOUNTER — Ambulatory Visit
Admission: RE | Admit: 2016-05-17 | Discharge: 2016-05-17 | Disposition: A | Discharge status: Home / Self Care | Payer: No Typology Code available for payment source | Source: Ambulatory Visit | Attending: Radiation Oncology | Admitting: Radiation Oncology

## 2016-05-17 DIAGNOSIS — Z510 Encounter for antineoplastic radiation therapy: Secondary | ICD-10-CM | POA: Diagnosis not present

## 2016-05-18 ENCOUNTER — Ambulatory Visit
Admission: RE | Admit: 2016-05-18 | Discharge: 2016-05-18 | Disposition: A | Discharge status: Home / Self Care | Payer: No Typology Code available for payment source | Source: Ambulatory Visit | Attending: Radiation Oncology | Admitting: Radiation Oncology

## 2016-05-18 DIAGNOSIS — Z510 Encounter for antineoplastic radiation therapy: Secondary | ICD-10-CM | POA: Diagnosis not present

## 2016-05-19 ENCOUNTER — Ambulatory Visit: Payer: Self-pay | Admitting: *Deleted

## 2016-05-19 ENCOUNTER — Ambulatory Visit
Admission: RE | Admit: 2016-05-19 | Discharge: 2016-05-19 | Disposition: A | Discharge status: Home / Self Care | Payer: No Typology Code available for payment source | Source: Ambulatory Visit | Attending: Radiation Oncology | Admitting: Radiation Oncology

## 2016-05-19 ENCOUNTER — Encounter: Payer: Self-pay | Admitting: General Practice

## 2016-05-19 DIAGNOSIS — Z510 Encounter for antineoplastic radiation therapy: Secondary | ICD-10-CM | POA: Diagnosis not present

## 2016-05-19 NOTE — [place] Notes (Signed)
[facility] Spiritual Care Note  Followed up by phone to offer further encouragement.  Debbie Baker was napping but knows that Support Team remains available for additional care.   [place], [place], [facility] Pager [phone] Voicemail [phone]

## 2016-05-20 ENCOUNTER — Ambulatory Visit
Admission: RE | Admit: 2016-05-20 | Discharge: 2016-05-20 | Disposition: A | Discharge status: Home / Self Care | Payer: No Typology Code available for payment source | Source: Ambulatory Visit | Attending: Radiation Oncology | Admitting: Radiation Oncology

## 2016-05-20 ENCOUNTER — Encounter: Payer: Self-pay | Admitting: Radiation Oncology

## 2016-05-20 VITALS — BP 110/79 | HR 77 | Temp 98.3°F | Ht 68.0 in | Wt 194.4 lb

## 2016-05-20 DIAGNOSIS — C3492 Malignant neoplasm of unspecified part of left bronchus or lung: Secondary | ICD-10-CM

## 2016-05-20 DIAGNOSIS — Z510 Encounter for antineoplastic radiation therapy: Secondary | ICD-10-CM | POA: Diagnosis not present

## 2016-05-20 LAB — ACID FAST CULTURE WITH REFLEXED SENSITIVITIES (MYCOBACTERIA)

## 2016-05-20 LAB — ACID FAST CULTURE WITH REFLEXED SENSITIVITIES
ACID FAST CULTURE - AFSCU3: NEGATIVE
ACID FAST CULTURE - AFSCU3: NEGATIVE

## 2016-05-20 MED ORDER — HYDROCODONE-ACETAMINOPHEN 7.5-325 MG/15ML PO SOLN: 15.0000 mL | Freq: Four times a day (QID) | ORAL | 0 refills | Status: DC | PRN

## 2016-05-20 NOTE — [place] Notes (Signed)
  Radiation Oncology         [phone]   Name: Debbie Baker MRN: [id number]   Date: 05/20/2016  DOB: Oct 02, 1965     Weekly Radiation Therapy Management    ICD-9-CM ICD-10-CM   1. Adenocarcinoma of left lung, stage 3 (HCC) 162.9 C34.92 HYDROcodone-acetaminophen (HYCET) 7.5-325 mg/15 ml solution    Current Dose: 30 Gy  Planned Dose:  66 Gy  Narrative The patient presents for routine under treatment assessment.  Esophageal discomfort  Set-up films were reviewed. The chart was checked.  Physical Findings . 6 lb weight loss since 04/18/16. No significant changes. Alert, in no acute distress.  Impression The patient is tolerating radiation.   Plan Continue treatment as planned. I encouraged the patient to continue taking Carafate to alleviate pain while eating or taking vitamins. I will prescribe Hycet today; she has been instructed on how to take this prescription.     Sheral Apley Tammi Klippel, M.D.  This document serves as a record of services personally performed by Tyler Pita, MD and Shona Simpson, PA. It was created on his behalf by Maryla Morrow, a trained medical scribe. The creation of this record is based on the scribe's personal observations and the provider's statements to them. This document has been checked and approved by the attending provider.

## 2016-05-20 NOTE — [place] Notes (Signed)
Debbie Baker is here for undertreat.  Shona Simpson PA and Dr. Tammi Klippel saw her for undertreat. They discussed sucralfate and she was given a prescription for pain medicine due to pain while swallowing.   BP 110/79   Pulse 77   Temp 98.3 F (36.8 C)   Ht '5\' 8"'$  (1.727 m)   Wt 194 lb 6.4 oz (88.2 kg)   BMI 29.56 kg/m    Wt Readings from Last 3 Encounters:  05/20/16 194 lb 6.4 oz (88.2 kg)  05/13/16 192 lb 6.4 oz (87.3 kg)  05/09/16 195 lb 9.6 oz (88.7 kg)

## 2016-05-24 ENCOUNTER — Ambulatory Visit (HOSPITAL_BASED_OUTPATIENT_CLINIC_OR_DEPARTMENT_OTHER): Payer: No Typology Code available for payment source | Admitting: Internal Medicine

## 2016-05-24 ENCOUNTER — Ambulatory Visit
Admission: RE | Admit: 2016-05-24 | Discharge: 2016-05-24 | Disposition: A | Discharge status: Home / Self Care | Payer: No Typology Code available for payment source | Source: Ambulatory Visit | Attending: Radiation Oncology | Admitting: Radiation Oncology

## 2016-05-24 ENCOUNTER — Ambulatory Visit (HOSPITAL_BASED_OUTPATIENT_CLINIC_OR_DEPARTMENT_OTHER): Payer: No Typology Code available for payment source

## 2016-05-24 ENCOUNTER — Encounter: Payer: Self-pay | Admitting: Internal Medicine

## 2016-05-24 ENCOUNTER — Other Ambulatory Visit (HOSPITAL_BASED_OUTPATIENT_CLINIC_OR_DEPARTMENT_OTHER): Payer: No Typology Code available for payment source

## 2016-05-24 VITALS — BP 101/72 | HR 84 | Temp 98.3°F | Resp 18 | Ht 68.0 in | Wt 193.8 lb

## 2016-05-24 DIAGNOSIS — C3492 Malignant neoplasm of unspecified part of left bronchus or lung: Secondary | ICD-10-CM

## 2016-05-24 DIAGNOSIS — Z5111 Encounter for antineoplastic chemotherapy: Secondary | ICD-10-CM | POA: Diagnosis not present

## 2016-05-24 DIAGNOSIS — Z510 Encounter for antineoplastic radiation therapy: Secondary | ICD-10-CM | POA: Diagnosis not present

## 2016-05-24 DIAGNOSIS — R1310 Dysphagia, unspecified: Secondary | ICD-10-CM

## 2016-05-24 HISTORY — DX: Dysphagia, unspecified: R13.10

## 2016-05-24 LAB — CBC WITH DIFFERENTIAL/PLATELET
BASO%: 0.6 % (ref 0.0–2.0)
BASOS ABS: 0 10*3/uL (ref 0.0–0.1)
EOS ABS: 0.1 10*3/uL (ref 0.0–0.5)
EOS%: 1 % (ref 0.0–7.0)
HEMATOCRIT: 39.6 % (ref 34.8–46.6)
HEMOGLOBIN: 13.4 g/dL (ref 11.6–15.9)
LYMPH#: 1.2 10*3/uL (ref 0.9–3.3)
LYMPH%: 24.2 % (ref 14.0–49.7)
MCH: 31.5 pg (ref 25.1–34.0)
MCHC: 33.8 g/dL (ref 31.5–36.0)
MCV: 93 fL (ref 79.5–101.0)
MONO#: 0.6 10*3/uL (ref 0.1–0.9)
MONO%: 12.7 % (ref 0.0–14.0)
NEUT#: 3.1 10*3/uL (ref 1.5–6.5)
NEUT%: 61.5 % (ref 38.4–76.8)
PLATELETS: 238 10*3/uL (ref 145–400)
RBC: 4.26 10*6/uL (ref 3.70–5.45)
RDW: 12.4 % (ref 11.2–14.5)
WBC: 5 10*3/uL (ref 3.9–10.3)

## 2016-05-24 LAB — COMPREHENSIVE METABOLIC PANEL
ALBUMIN: 3.7 g/dL (ref 3.5–5.0)
ALK PHOS: 91 U/L (ref 40–150)
ALT: 49 U/L (ref 0–55)
ANION GAP: 9 meq/L (ref 3–11)
AST: 29 U/L (ref 5–34)
BUN: 8.4 mg/dL (ref 7.0–26.0)
CALCIUM: 9.7 mg/dL (ref 8.4–10.4)
CHLORIDE: 107 meq/L (ref 98–109)
CO2: 24 mEq/L (ref 22–29)
Creatinine: 0.7 mg/dL (ref 0.6–1.1)
Glucose: 94 mg/dl (ref 70–140)
POTASSIUM: 3.9 meq/L (ref 3.5–5.1)
Sodium: 140 mEq/L (ref 136–145)
Total Bilirubin: 0.49 mg/dL (ref 0.20–1.20)
Total Protein: 7.1 g/dL (ref 6.4–8.3)

## 2016-05-24 MED ORDER — FAMOTIDINE IN NACL 20-0.9 MG/50ML-% IV SOLN
20.0000 mg | Freq: Once | INTRAVENOUS | Status: AC
  Administered 2016-05-24: 20 mg via INTRAVENOUS

## 2016-05-24 MED ORDER — FAMOTIDINE IN NACL 20-0.9 MG/50ML-% IV SOLN
INTRAVENOUS | Status: AC
  Filled 2016-05-24: qty 50

## 2016-05-24 MED ORDER — SODIUM CHLORIDE 0.9 % IV SOLN
20.0000 mg | Freq: Once | INTRAVENOUS | Status: AC
  Administered 2016-05-24: 20 mg via INTRAVENOUS
  Filled 2016-05-24: qty 2

## 2016-05-24 MED ORDER — SODIUM CHLORIDE 0.9 % IV SOLN
Freq: Once | INTRAVENOUS | Status: AC
  Administered 2016-05-24: 10:00:00 via INTRAVENOUS

## 2016-05-24 MED ORDER — PALONOSETRON HCL INJECTION 0.25 MG/5ML
INTRAVENOUS | Status: AC
  Filled 2016-05-24: qty 5

## 2016-05-24 MED ORDER — PALONOSETRON HCL INJECTION 0.25 MG/5ML
0.2500 mg | Freq: Once | INTRAVENOUS | Status: AC
  Administered 2016-05-24: 0.25 mg via INTRAVENOUS

## 2016-05-24 MED ORDER — SODIUM CHLORIDE 0.9 % IV SOLN
284.2000 mg | Freq: Once | INTRAVENOUS | Status: AC
  Administered 2016-05-24: 280 mg via INTRAVENOUS
  Filled 2016-05-24: qty 28

## 2016-05-24 MED ORDER — DIPHENHYDRAMINE HCL 50 MG/ML IJ SOLN
50.0000 mg | Freq: Once | INTRAMUSCULAR | Status: AC
  Administered 2016-05-24: 50 mg via INTRAVENOUS

## 2016-05-24 MED ORDER — DIPHENHYDRAMINE HCL 50 MG/ML IJ SOLN
INTRAMUSCULAR | Status: AC
  Filled 2016-05-24: qty 1

## 2016-05-24 MED ORDER — SODIUM CHLORIDE 0.9 % IV SOLN
45.0000 mg/m2 | Freq: Once | INTRAVENOUS | Status: AC
  Administered 2016-05-24: 96 mg via INTRAVENOUS
  Filled 2016-05-24: qty 16

## 2016-05-24 NOTE — Patient Instructions (Signed)
[facility] Discharge Instructions for Patients Receiving Chemotherapy  Today you received the following chemotherapy agents Taxol and Carboplatin  To help prevent nausea and vomiting after your treatment, we encourage you to take your nausea medication as directed. No Zofran for 3 days. Take Compazine instead.   If you develop nausea and vomiting that is not controlled by your nausea medication, call the clinic.   BELOW ARE SYMPTOMS THAT SHOULD BE REPORTED IMMEDIATELY:  *FEVER GREATER THAN 100.5 F  *CHILLS WITH OR WITHOUT FEVER  NAUSEA AND VOMITING THAT IS NOT CONTROLLED WITH YOUR NAUSEA MEDICATION  *UNUSUAL SHORTNESS OF BREATH  *UNUSUAL BRUISING OR BLEEDING  TENDERNESS IN MOUTH AND THROAT WITH OR WITHOUT PRESENCE OF ULCERS  *URINARY PROBLEMS  *BOWEL PROBLEMS  UNUSUAL RASH Items with * indicate a potential emergency and should be followed up as soon as possible.  Feel free to call the clinic you have any questions or concerns. The clinic phone number is [phone].  Please show the CHEMO ALERT CARD at check-in to the Emergency Department and triage nurse.   

## 2016-05-24 NOTE — Progress Notes (Signed)
[facility] [phone]   [phone]  OFFICE PROGRESS NOTE  Gennette Pac, MD [place] [address]  DIAGNOSIS: Stage IIIa (T2a, N2, M0) non-small cell lung cancer, adenocarcinoma presented with left suprahilar mass, mediastinal lymphadenopathy and suspicious pulmonary nodule in the left upper lobe diagnosed in November 2017. No actionable mutations on Guardant 360 testing.  PRIOR THERAPY: None  CURRENT THERAPY: Concurrent chemoradiation with weekly carboplatin for AUC of 2 and paclitaxel 45 MG/M2 status post 3 cycles.  INTERVAL HISTORY: Debbie Baker 50 y.o. female returns to the clinic today for follow-up visit. The patient is currently undergoing concurrent chemoradiation with weekly carboplatin and paclitaxel is status post 3 cycles. She is tolerating the treatment well but she has mild odynophagia. She is currently on Carafate liquid hydrocodone as well as PPI. She denied having any chest pain, shortness of breath, cough or hemoptysis. She has no fever or chills. She denied having any weight loss or night sweats. She is here today for evaluation before starting cycle #4.  MEDICAL HISTORY: Past Medical History:  Diagnosis Date  . Allergic rhinitis   . Deaf, left   . Diverticulosis   . Dyspnea   . Encounter for antineoplastic chemotherapy 04/18/2016  . GERD (gastroesophageal reflux disease)   . Hyperlipidemia   . Lung cancer ([facility])    non small cell lung cancer favoring adenocarcinoma   . Lung nodules   . Migraine   . Pneumonia   . PONV (postoperative nausea and vomiting)   . Wears glasses     ALLERGIES:  is allergic to penicillins; chantix [varenicline]; and levaquin [levofloxacin].  MEDICATIONS:  Current Outpatient Prescriptions  Medication Sig Dispense Refill  . benzonatate (TESSALON) 100 MG capsule Take 1 capsule (100 mg total) by mouth every 6 (six) hours as needed for cough. 30 capsule 1  .  Calcium-Magnesium-Vitamin D 185-50-100 MG-MG-UNIT CAPS Take 1 tablet by mouth every evening.    . cetirizine (ZYRTEC) 10 MG tablet Take 10 mg by mouth daily.    Marland Kitchen ELDERBERRY PO Take 2 tablets by mouth daily. Chewable Supplement    . HYDROcodone-acetaminophen (HYCET) 7.5-325 mg/15 ml solution Take 15 mLs by mouth 4 (four) times daily as needed for moderate pain. 473 mL 0  . Misc Natural Products (BLACK COHOSH MENOPAUSE COMPLEX) TABS Take 1 capsule by mouth 2 (two) times daily.    . Misc Natural Products (TURMERIC CURCUMIN) CAPS Take 1 capsule by mouth every evening. 1500 mg capsule    . Multiple Vitamins-Minerals (WOMENS 50+ MULTI VITAMIN/MIN) TABS Take 1 tablet by mouth daily.    . naproxen sodium (ANAPROX) 220 MG tablet Take 220-440 mg by mouth 2 (two) times daily as needed (for pain).     . Omega-3 Fatty Acids (FISH OIL) 1000 MG CAPS Take 2,000 mg by mouth 2 (two) times daily.    Marland Kitchen OVER THE COUNTER MEDICATION Take 250 mg by mouth daily. Jiaogulan Supplement    . pantoprazole (PROTONIX) 40 MG tablet Take 1 tablet (40 mg total) by mouth every evening.    . pravastatin (PRAVACHOL) 20 MG tablet Take 20 mg by mouth every evening.  3  . prochlorperazine (COMPAZINE) 10 MG tablet Take 1 tablet (10 mg total) by mouth every 6 (six) hours as needed for nausea or vomiting. (Patient not taking: Reported on 05/13/2016) 30 tablet 0  . ranitidine (ZANTAC) 150 MG tablet Take 150-300 mg by mouth every evening. 300 mg with more  acidic meals    . sucralfate (CARAFATE) 1 g tablet Take 1 tablet (1 g total) by mouth 4 (four) times daily -  with meals and at bedtime. 5 min before meals for radiation induced esophagitis 120 tablet 2  . Wound Cleansers (RADIAPLEX EX) Apply topically.     No current facility-administered medications for this visit.     SURGICAL HISTORY:  Past Surgical History:  Procedure Laterality Date  . ABDOMINAL HYSTERECTOMY    . VIDEO BRONCHOSCOPY Bilateral 07/03/2015   Procedure: VIDEO  BRONCHOSCOPY WITH FLUORO;  Surgeon: Marshell Garfinkel, MD;  Location: [facility];  Service: Cardiopulmonary;  Laterality: Bilateral;  . VIDEO BRONCHOSCOPY WITH ENDOBRONCHIAL ULTRASOUND N/A 04/06/2016   Procedure: VIDEO BRONCHOSCOPY WITH ENDOBRONCHIAL ULTRASOUND;  Surgeon: Collene Gobble, MD;  Location: [facility];  Service: Thoracic;  Laterality: N/A;  . WRIST FRACTURE SURGERY      REVIEW OF SYSTEMS:  A comprehensive review of systems was negative except for: Gastrointestinal: positive for odynophagia   PHYSICAL EXAMINATION: General appearance: alert, cooperative and no distress Head: Normocephalic, without obvious abnormality, atraumatic Neck: no adenopathy, no JVD, supple, symmetrical, trachea midline and thyroid not enlarged, symmetric, no tenderness/mass/nodules Lymph nodes: Cervical, supraclavicular, and axillary nodes normal. Resp: clear to auscultation bilaterally Back: symmetric, no curvature. ROM normal. No CVA tenderness. Cardio: regular rate and rhythm, S1, S2 normal, no murmur, click, rub or gallop GI: soft, non-tender; bowel sounds normal; no masses,  no organomegaly Extremities: extremities normal, atraumatic, no cyanosis or edema  ECOG PERFORMANCE STATUS: 1 - Symptomatic but completely ambulatory  Blood pressure 101/72, pulse 84, temperature 98.3 F (36.8 C), temperature source Oral, resp. rate 18, height '5\' 8"'$  (1.727 m), weight 193 lb 12.8 oz (87.9 kg), SpO2 99 %.  LABORATORY DATA: Lab Results  Component Value Date   WBC 5.0 05/24/2016   HGB 13.4 05/24/2016   HCT 39.6 05/24/2016   MCV 93.0 05/24/2016   PLT 238 05/24/2016      Chemistry      Component Value Date/Time   NA 140 05/24/2016 0809   K 3.9 05/24/2016 0809   CL 107 04/06/2016 0742   CO2 24 05/24/2016 0809   BUN 8.4 05/24/2016 0809   CREATININE 0.7 05/24/2016 0809      Component Value Date/Time   CALCIUM 9.7 05/24/2016 0809   ALKPHOS 91 05/24/2016 0809   AST 29 05/24/2016 0809   ALT 49 05/24/2016 0809    BILITOT 0.49 05/24/2016 0809       RADIOGRAPHIC STUDIES: No results found.  ASSESSMENT AND PLAN: This is a very pleasant 50 years old white female with a stage IIIA non-small cell lung cancer currently undergoing a course of concurrent chemoradiation with weekly carboplatin and paclitaxel status post 3 cycles. The patient is tolerating the treatment well except for mild odynophagia. I recommended for the patient to proceed with cycle #4 today as scheduled. She will come back for follow-up visit in 2 weeks for evaluation before starting cycle #6. Further odynophagia, she will continue her current treatment with liquid hydrocodone, Carafate and proton pump inhibitors. The patient voices understanding of current disease status and treatment options and is in agreement with the current care plan.  All questions were answered. The patient knows to call the clinic with any problems, questions or concerns. We can certainly see the patient much sooner if necessary.  I spent 10 minutes counseling the patient face to face. The total time spent in the appointment was 15 minutes.  Disclaimer: This note  was dictated with voice recognition software. Similar sounding words can inadvertently be transcribed and may not be corrected upon review.

## 2016-05-25 ENCOUNTER — Ambulatory Visit
Admission: RE | Admit: 2016-05-25 | Discharge: 2016-05-25 | Disposition: A | Discharge status: Home / Self Care | Payer: No Typology Code available for payment source | Source: Ambulatory Visit | Attending: Radiation Oncology | Admitting: Radiation Oncology

## 2016-05-25 ENCOUNTER — Telehealth: Payer: Self-pay | Admitting: Radiation Oncology

## 2016-05-25 DIAGNOSIS — Z510 Encounter for antineoplastic radiation therapy: Secondary | ICD-10-CM | POA: Diagnosis not present

## 2016-05-25 NOTE — Telephone Encounter (Signed)
Phoned patient to inquire about status. Patient expressed great anxiety surrounding hycet script and needle sticks for chemo since her son recently passed from a heroin overdose. She reports she is coping better now. She reports using hycet in the ladder parts of the week and weekend "only when I really need it."  She states, "the IV stick for the first chemo was rough but, since then have been fine." Understands to contact this RN with any future needs.

## 2016-05-26 ENCOUNTER — Ambulatory Visit
Admission: RE | Admit: 2016-05-26 | Discharge: 2016-05-26 | Disposition: A | Discharge status: Home / Self Care | Payer: No Typology Code available for payment source | Source: Ambulatory Visit | Attending: Radiation Oncology | Admitting: Radiation Oncology

## 2016-05-26 DIAGNOSIS — Z510 Encounter for antineoplastic radiation therapy: Secondary | ICD-10-CM | POA: Diagnosis not present

## 2016-05-27 ENCOUNTER — Ambulatory Visit
Admission: RE | Admit: 2016-05-27 | Discharge: 2016-05-27 | Disposition: A | Discharge status: Home / Self Care | Payer: No Typology Code available for payment source | Source: Ambulatory Visit | Attending: Radiation Oncology | Admitting: Radiation Oncology

## 2016-05-27 ENCOUNTER — Encounter: Payer: Self-pay | Admitting: Radiation Oncology

## 2016-05-27 VITALS — BP 102/74 | HR 82 | Temp 97.6°F | Resp 18 | Wt 191.4 lb

## 2016-05-27 DIAGNOSIS — Z510 Encounter for antineoplastic radiation therapy: Secondary | ICD-10-CM | POA: Diagnosis not present

## 2016-05-27 DIAGNOSIS — C3492 Malignant neoplasm of unspecified part of left bronchus or lung: Secondary | ICD-10-CM

## 2016-05-27 NOTE — Progress Notes (Signed)
  Radiation Oncology         [phone]   Name: Debbie Baker MRN: [id number]   Date: 05/27/2016  DOB: 02-27-1966     Weekly Radiation Therapy Management    ICD-9-CM ICD-10-CM   1. Adenocarcinoma of left lung, stage 3 (HCC) 162.9 C34.92     Current Dose: 38 Gy  Planned Dose:  66 Gy  Narrative The patient presents for routine under treatment assessment.  Weight and vitals stable. Denies pain. She reports mild fatigue. Reports managing esophagitis with Hycet only towards the end of the week. She reports a productive cough with scant hemoptysis. The patient reports having a bloody nose in the morning despite using a humidifier. She reports her shortness of breath remains unchanged. Per nursing, the patient has faint hyperpigmentation to the left upper back and center of chest without desquamation. The patient reports using Radiaplex as directed.  Set-up films were reviewed. The chart was checked.  Physical Findings Vitals:   05/27/16 0815  BP: 102/74  Pulse: 82  Resp: 18  Temp: 97.6 F (36.4 C)    No significant changes. Alert, in no acute distress.  Impression The patient is tolerating radiation.   Plan Continue treatment as planned.    ------------------------------------------------  Jodelle Gross, MD, PhD  This document serves as a record of services personally performed by Kyung Rudd, MD. It was created on his behalf by Maryla Morrow, a trained medical scribe. The creation of this record is based on the scribe's personal observations and the provider's statements to them. This document has been checked and approved by the attending provider.

## 2016-05-27 NOTE — Progress Notes (Addendum)
Weight and vitals stable. Denies pain. Reports managing esophagitis with hycet only toward the end of the week. Reports a productive cough with scant hemoptysis. Reports having a bloody nose in the morning despite using a humidifier. Reports shortness of breath is unchanged. Faint hyperpigmentation left upper back and center of chest without desquamation. Reports using radiaplex as directed. Reports mild fatigue. Reports frequent headaches. Denies nausea, vomiting or dizziness. Reports abdominal cramping and bowel urgency on day 3 following chemotherapy.  BP 102/74 (BP Location: Left Arm, Patient Position: Sitting, Cuff Size: Normal)   Pulse 82   Temp 97.6 F (36.4 C) (Oral)   Resp 18   Wt 191 lb 6.4 oz (86.8 kg)   SpO2 99%   BMI 29.10 kg/m  Wt Readings from Last 3 Encounters:  05/27/16 191 lb 6.4 oz (86.8 kg)  05/24/16 193 lb 12.8 oz (87.9 kg)  05/20/16 194 lb 6.4 oz (88.2 kg)

## 2016-05-31 ENCOUNTER — Telehealth: Payer: Self-pay | Admitting: Internal Medicine

## 2016-05-31 ENCOUNTER — Ambulatory Visit (HOSPITAL_BASED_OUTPATIENT_CLINIC_OR_DEPARTMENT_OTHER): Payer: No Typology Code available for payment source

## 2016-05-31 ENCOUNTER — Ambulatory Visit: Payer: No Typology Code available for payment source

## 2016-05-31 ENCOUNTER — Ambulatory Visit
Admission: RE | Admit: 2016-05-31 | Discharge: 2016-05-31 | Disposition: A | Discharge status: Home / Self Care | Payer: No Typology Code available for payment source | Source: Ambulatory Visit | Attending: Radiation Oncology | Admitting: Radiation Oncology

## 2016-05-31 ENCOUNTER — Other Ambulatory Visit (HOSPITAL_BASED_OUTPATIENT_CLINIC_OR_DEPARTMENT_OTHER): Payer: No Typology Code available for payment source

## 2016-05-31 VITALS — BP 110/69 | HR 85 | Temp 98.6°F | Resp 17

## 2016-05-31 DIAGNOSIS — C3492 Malignant neoplasm of unspecified part of left bronchus or lung: Secondary | ICD-10-CM

## 2016-05-31 DIAGNOSIS — Z5111 Encounter for antineoplastic chemotherapy: Secondary | ICD-10-CM | POA: Diagnosis not present

## 2016-05-31 DIAGNOSIS — Z510 Encounter for antineoplastic radiation therapy: Secondary | ICD-10-CM | POA: Diagnosis not present

## 2016-05-31 LAB — CBC WITH DIFFERENTIAL/PLATELET
BASO%: 0.9 % (ref 0.0–2.0)
Basophils Absolute: 0.1 10*3/uL (ref 0.0–0.1)
EOS ABS: 0 10*3/uL (ref 0.0–0.5)
EOS%: 0.8 % (ref 0.0–7.0)
HEMATOCRIT: 42.1 % (ref 34.8–46.6)
HGB: 14.4 g/dL (ref 11.6–15.9)
LYMPH#: 1.1 10*3/uL (ref 0.9–3.3)
LYMPH%: 16.8 % (ref 14.0–49.7)
MCH: 32.2 pg (ref 25.1–34.0)
MCHC: 34.3 g/dL (ref 31.5–36.0)
MCV: 93.9 fL (ref 79.5–101.0)
MONO#: 0.6 10*3/uL (ref 0.1–0.9)
MONO%: 9.2 % (ref 0.0–14.0)
NEUT#: 4.5 10*3/uL (ref 1.5–6.5)
NEUT%: 72.3 % (ref 38.4–76.8)
PLATELETS: 216 10*3/uL (ref 145–400)
RBC: 4.48 10*6/uL (ref 3.70–5.45)
RDW: 12.5 % (ref 11.2–14.5)
WBC: 6.3 10*3/uL (ref 3.9–10.3)

## 2016-05-31 LAB — COMPREHENSIVE METABOLIC PANEL
ALT: 56 U/L — AB (ref 0–55)
AST: 26 U/L (ref 5–34)
Albumin: 3.8 g/dL (ref 3.5–5.0)
Alkaline Phosphatase: 87 U/L (ref 40–150)
Anion Gap: 11 mEq/L (ref 3–11)
BUN: 6.9 mg/dL — ABNORMAL LOW (ref 7.0–26.0)
CALCIUM: 9.9 mg/dL (ref 8.4–10.4)
CHLORIDE: 105 meq/L (ref 98–109)
CO2: 23 meq/L (ref 22–29)
CREATININE: 0.7 mg/dL (ref 0.6–1.1)
EGFR: >90 mL/min/{1.73_m2} (ref >90)
GLUCOSE: 90 mg/dL (ref 70–140)
Potassium: 4 mEq/L (ref 3.5–5.1)
SODIUM: 139 meq/L (ref 136–145)
Total Bilirubin: 0.63 mg/dL (ref 0.20–1.20)
Total Protein: 7.4 g/dL (ref 6.4–8.3)

## 2016-05-31 MED ORDER — FAMOTIDINE IN NACL 20-0.9 MG/50ML-% IV SOLN
20.0000 mg | Freq: Once | INTRAVENOUS | Status: AC
  Administered 2016-05-31: 20 mg via INTRAVENOUS

## 2016-05-31 MED ORDER — PALONOSETRON HCL INJECTION 0.25 MG/5ML
0.2500 mg | Freq: Once | INTRAVENOUS | Status: AC
  Administered 2016-05-31: 0.25 mg via INTRAVENOUS

## 2016-05-31 MED ORDER — PALONOSETRON HCL INJECTION 0.25 MG/5ML
INTRAVENOUS | Status: AC
  Filled 2016-05-31: qty 5

## 2016-05-31 MED ORDER — DEXAMETHASONE SODIUM PHOSPHATE 10 MG/ML IJ SOLN
INTRAMUSCULAR | Status: AC
  Filled 2016-05-31: qty 2

## 2016-05-31 MED ORDER — SODIUM CHLORIDE 0.9 % IV SOLN
20.0000 mg | Freq: Once | INTRAVENOUS | Status: AC
  Administered 2016-05-31: 20 mg via INTRAVENOUS
  Filled 2016-05-31: qty 2

## 2016-05-31 MED ORDER — DIPHENHYDRAMINE HCL 50 MG/ML IJ SOLN
50.0000 mg | Freq: Once | INTRAMUSCULAR | Status: AC
  Administered 2016-05-31: 50 mg via INTRAVENOUS

## 2016-05-31 MED ORDER — DIPHENHYDRAMINE HCL 50 MG/ML IJ SOLN
INTRAMUSCULAR | Status: AC
  Filled 2016-05-31: qty 1

## 2016-05-31 MED ORDER — PACLITAXEL CHEMO INJECTION 300 MG/50ML
45.0000 mg/m2 | Freq: Once | INTRAVENOUS | Status: AC
  Administered 2016-05-31: 96 mg via INTRAVENOUS
  Filled 2016-05-31: qty 16

## 2016-05-31 MED ORDER — SODIUM CHLORIDE 0.9 % IV SOLN
Freq: Once | INTRAVENOUS | Status: AC
  Administered 2016-05-31: 10:00:00 via INTRAVENOUS

## 2016-05-31 MED ORDER — SODIUM CHLORIDE 0.9 % IV SOLN
284.2000 mg | Freq: Once | INTRAVENOUS | Status: AC
  Administered 2016-05-31: 280 mg via INTRAVENOUS
  Filled 2016-05-31: qty 28

## 2016-05-31 MED ORDER — FAMOTIDINE IN NACL 20-0.9 MG/50ML-% IV SOLN
INTRAVENOUS | Status: AC
  Filled 2016-05-31: qty 50

## 2016-05-31 NOTE — Patient Instructions (Signed)
[facility] Discharge Instructions for Patients Receiving Chemotherapy  Today you received the following chemotherapy agents Taxol and Carboplatin  To help prevent nausea and vomiting after your treatment, we encourage you to take your nausea medication as directed. No Zofran for 3 days. Take Compazine instead.   If you develop nausea and vomiting that is not controlled by your nausea medication, call the clinic.   BELOW ARE SYMPTOMS THAT SHOULD BE REPORTED IMMEDIATELY:  *FEVER GREATER THAN 100.5 F  *CHILLS WITH OR WITHOUT FEVER  NAUSEA AND VOMITING THAT IS NOT CONTROLLED WITH YOUR NAUSEA MEDICATION  *UNUSUAL SHORTNESS OF BREATH  *UNUSUAL BRUISING OR BLEEDING  TENDERNESS IN MOUTH AND THROAT WITH OR WITHOUT PRESENCE OF ULCERS  *URINARY PROBLEMS  *BOWEL PROBLEMS  UNUSUAL RASH Items with * indicate a potential emergency and should be followed up as soon as possible.  Feel free to call the clinic you have any questions or concerns. The clinic phone number is [phone].  Please show the CHEMO ALERT CARD at check-in to the Emergency Department and triage nurse.   

## 2016-05-31 NOTE — Progress Notes (Signed)
Nutrition Assessment   Reason for Assessment:   Patient identified on Malnutrition Screening Tool  ASSESSMENT:  51 year old female with lung cancer currently receiving chemo and radiation therapy.  Patient seen during chemotherapy this am.  Past medical history of diverticulosis, GERD, HLD  Patient reports decreased intake secondary to difficulty swallowing, esophagitis.  Patient reports that she has been eating more soups, pudding, liquid foods recently.  Patient reports has lost some weight recently in the last month.   Medications: carafate, compazine, hycet  Labs: reviewed  Anthropometrics:   Height: 68 inches Weight: 191 pounds UBW: 196 pounds about 4-5 weeks ago per pt BMI: 29  3% weight loss in the last month  Estimated Energy Needs  Kcals: 1900-2150 kcals/d Protein: 86-103 g/d Fluid: 2.1 L/d  NUTRITION DIAGNOSIS: Inadequate oral intake related to cancer related treatments as evidenced by dysphagia and 3% weight loss in the last month    INTERVENTION:   Discussed easy to swallow foods. Handout provided.  Teach back used.   Discussed oral nutrition supplements as well to provide additional calories and protein.      MONITORING, EVALUATION, GOAL: Patient will consume adequate calories and protein to maintain lean body mass.    NEXT VISIT: as needed  Jalisa Sacco B. Zenia Resides, [place], [place] (pager)

## 2016-05-31 NOTE — Telephone Encounter (Signed)
FAXED PT 04/18/16 OFFICE NOTE TO [facility] [phone]

## 2016-05-31 NOTE — Progress Notes (Signed)
CBC, CMET reviewed with MD, okay to treat.

## 2016-06-01 ENCOUNTER — Ambulatory Visit
Admission: RE | Admit: 2016-06-01 | Discharge: 2016-06-01 | Disposition: A | Discharge status: Home / Self Care | Payer: No Typology Code available for payment source | Source: Ambulatory Visit | Attending: Radiation Oncology | Admitting: Radiation Oncology

## 2016-06-01 DIAGNOSIS — Z510 Encounter for antineoplastic radiation therapy: Secondary | ICD-10-CM | POA: Diagnosis not present

## 2016-06-02 ENCOUNTER — Ambulatory Visit
Admission: RE | Admit: 2016-06-02 | Discharge: 2016-06-02 | Disposition: A | Discharge status: Home / Self Care | Payer: No Typology Code available for payment source | Source: Ambulatory Visit | Attending: Radiation Oncology | Admitting: Radiation Oncology

## 2016-06-02 DIAGNOSIS — Z510 Encounter for antineoplastic radiation therapy: Secondary | ICD-10-CM | POA: Diagnosis not present

## 2016-06-03 ENCOUNTER — Ambulatory Visit
Admission: RE | Admit: 2016-06-03 | Discharge: 2016-06-03 | Disposition: A | Discharge status: Home / Self Care | Payer: No Typology Code available for payment source | Source: Ambulatory Visit | Attending: Radiation Oncology | Admitting: Radiation Oncology

## 2016-06-03 ENCOUNTER — Encounter: Payer: Self-pay | Admitting: Radiation Oncology

## 2016-06-03 VITALS — BP 102/73 | HR 93 | Temp 97.5°F | Resp 18 | Ht 68.0 in | Wt 192.6 lb

## 2016-06-03 DIAGNOSIS — C3492 Malignant neoplasm of unspecified part of left bronchus or lung: Secondary | ICD-10-CM

## 2016-06-03 DIAGNOSIS — Z510 Encounter for antineoplastic radiation therapy: Secondary | ICD-10-CM | POA: Diagnosis not present

## 2016-06-03 NOTE — Progress Notes (Signed)
  Radiation Oncology         [phone]   Name: Debbie Baker MRN: [id number]   Date: 06/03/2016  DOB: Oct 23, 1965     Weekly Radiation Therapy Management    ICD-9-CM ICD-10-CM   1. Adenocarcinoma of left lung, stage 3 (HCC) 162.9 C34.92     Current Dose: 46 Gy  Planned Dose:  66 Gy  Narrative The patient presents for routine under treatment assessment.  Weight and vitals stable. Denies pain. The patient reports managing esophagitis with Hycet only toward the end of the week. She reports a productive cough with scant hemoptysis. She reports having a bloody nose in the morning despite using a humidifier. She reports SOB is unchanged. The patient reports her fatigue is increasing. She reports occasional headaches. She denies nausea, vomiting, or dizziness. She reports abdominal cramping and bowel urgency on day 3 following chemotherapy.  Set-up films were reviewed. The chart was checked.  Physical Findings Vitals:   06/03/16 0831  BP: 102/73  Pulse: 93  Resp: 18  Temp: 97.5 F (36.4 C)  No significant changes. Alert, in no acute distress.  Impression The patient is tolerating radiation.   Plan Continue treatment as planned.      Sheral Apley Tammi Klippel, M.D.  This document serves as a record of services personally performed by Tyler Pita, MD. It was created on his behalf by Maryla Morrow, a trained medical scribe. The creation of this record is based on the scribe's personal observations and the provider's statements to them. This document has been checked and approved by the attending provider.

## 2016-06-03 NOTE — Progress Notes (Signed)
Weight and vitals stable. Denies pain. Reports managing esophagitis with hycet only toward the end of the week. Reports a productive cough with scant hemoptysis. Reports having a bloody nose in the morning despite using a humidifier. Reports shortness of breath is unchanged. Faint hyperpigmentation left upper back and center of chest without desquamation. Reports using radiaplex as directed. Reports  Fatigue is increasing. Reports ocassional headaches. Denies nausea, vomiting or dizziness. Reports abdominal cramping and bowel urgency on day 3 following chemotherapy. Wt Readings from Last 3 Encounters:  06/03/16 192 lb 9.6 oz (87.4 kg)  05/27/16 191 lb 6.4 oz (86.8 kg)  05/24/16 193 lb 12.8 oz (87.9 kg)  BP 102/73   Pulse 93   Temp 97.5 F (36.4 C) (Oral)   Resp 18   Ht '5\' 8"'$  (1.727 m)   Wt 192 lb 9.6 oz (87.4 kg)   SpO2 99%   BMI 29.28 kg/m

## 2016-06-06 ENCOUNTER — Encounter: Payer: Self-pay | Admitting: Internal Medicine

## 2016-06-06 ENCOUNTER — Ambulatory Visit (HOSPITAL_BASED_OUTPATIENT_CLINIC_OR_DEPARTMENT_OTHER): Payer: No Typology Code available for payment source

## 2016-06-06 ENCOUNTER — Ambulatory Visit
Admission: RE | Admit: 2016-06-06 | Discharge: 2016-06-06 | Disposition: A | Discharge status: Home / Self Care | Payer: No Typology Code available for payment source | Source: Ambulatory Visit | Attending: Radiation Oncology | Admitting: Radiation Oncology

## 2016-06-06 ENCOUNTER — Telehealth: Payer: Self-pay | Admitting: Internal Medicine

## 2016-06-06 ENCOUNTER — Ambulatory Visit (HOSPITAL_BASED_OUTPATIENT_CLINIC_OR_DEPARTMENT_OTHER): Payer: No Typology Code available for payment source | Admitting: Internal Medicine

## 2016-06-06 ENCOUNTER — Telehealth: Payer: Self-pay | Admitting: *Deleted

## 2016-06-06 ENCOUNTER — Other Ambulatory Visit (HOSPITAL_BASED_OUTPATIENT_CLINIC_OR_DEPARTMENT_OTHER): Payer: No Typology Code available for payment source

## 2016-06-06 VITALS — BP 109/74 | HR 115 | Temp 98.3°F | Resp 16 | Ht 68.0 in | Wt 192.6 lb

## 2016-06-06 VITALS — HR 100

## 2016-06-06 DIAGNOSIS — Z5111 Encounter for antineoplastic chemotherapy: Secondary | ICD-10-CM

## 2016-06-06 DIAGNOSIS — C3492 Malignant neoplasm of unspecified part of left bronchus or lung: Secondary | ICD-10-CM

## 2016-06-06 DIAGNOSIS — R1310 Dysphagia, unspecified: Secondary | ICD-10-CM

## 2016-06-06 DIAGNOSIS — Z510 Encounter for antineoplastic radiation therapy: Secondary | ICD-10-CM | POA: Diagnosis not present

## 2016-06-06 LAB — COMPREHENSIVE METABOLIC PANEL
ALBUMIN: 4 g/dL (ref 3.5–5.0)
ALK PHOS: 108 U/L (ref 40–150)
ALT: 75 U/L — AB (ref 0–55)
AST: 42 U/L — AB (ref 5–34)
Anion Gap: 10 mEq/L (ref 3–11)
BUN: 6 mg/dL — AB (ref 7.0–26.0)
CALCIUM: 9.7 mg/dL (ref 8.4–10.4)
CO2: 24 mEq/L (ref 22–29)
CREATININE: 0.7 mg/dL (ref 0.6–1.1)
Chloride: 106 mEq/L (ref 98–109)
EGFR: >90 mL/min/{1.73_m2} (ref >90)
Glucose: 103 mg/dl (ref 70–140)
POTASSIUM: 3.7 meq/L (ref 3.5–5.1)
Sodium: 140 mEq/L (ref 136–145)
Total Bilirubin: 0.45 mg/dL (ref 0.20–1.20)
Total Protein: 7.3 g/dL (ref 6.4–8.3)

## 2016-06-06 LAB — CBC WITH DIFFERENTIAL/PLATELET
BASO%: 0.9 % (ref 0.0–2.0)
BASOS ABS: 0 10*3/uL (ref 0.0–0.1)
EOS%: 0.7 % (ref 0.0–7.0)
Eosinophils Absolute: 0 10*3/uL (ref 0.0–0.5)
HCT: 40.5 % (ref 34.8–46.6)
HEMOGLOBIN: 14.1 g/dL (ref 11.6–15.9)
LYMPH#: 1 10*3/uL (ref 0.9–3.3)
LYMPH%: 23.8 % (ref 14.0–49.7)
MCH: 32 pg (ref 25.1–34.0)
MCHC: 34.8 g/dL (ref 31.5–36.0)
MCV: 92 fL (ref 79.5–101.0)
MONO#: 0.2 10*3/uL (ref 0.1–0.9)
MONO%: 5.2 % (ref 0.0–14.0)
NEUT#: 2.9 10*3/uL (ref 1.5–6.5)
NEUT%: 69.4 % (ref 38.4–76.8)
PLATELETS: 207 10*3/uL (ref 145–400)
RBC: 4.4 10*6/uL (ref 3.70–5.45)
RDW: 12.7 % (ref 11.2–14.5)
WBC: 4.2 10*3/uL (ref 3.9–10.3)

## 2016-06-06 MED ORDER — BENZONATATE 100 MG PO CAPS: 100.0000 mg | ORAL_CAPSULE | Freq: Four times a day (QID) | ORAL | 1 refills | Status: DC | PRN

## 2016-06-06 MED ORDER — FAMOTIDINE IN NACL 20-0.9 MG/50ML-% IV SOLN
20.0000 mg | Freq: Once | INTRAVENOUS | Status: AC
Start: 2016-06-06 — End: 2016-06-06
  Administered 2016-06-06: 20 mg via INTRAVENOUS

## 2016-06-06 MED ORDER — SODIUM CHLORIDE 0.9 % IV SOLN
Freq: Once | INTRAVENOUS | Status: AC
  Administered 2016-06-06: 14:00:00 via INTRAVENOUS

## 2016-06-06 MED ORDER — PACLITAXEL CHEMO INJECTION 300 MG/50ML
45.0000 mg/m2 | Freq: Once | INTRAVENOUS | Status: AC
  Administered 2016-06-06: 96 mg via INTRAVENOUS
  Filled 2016-06-06: qty 16

## 2016-06-06 MED ORDER — SODIUM CHLORIDE 0.9 % IV SOLN
284.2000 mg | Freq: Once | INTRAVENOUS | Status: AC
  Administered 2016-06-06: 280 mg via INTRAVENOUS
  Filled 2016-06-06: qty 28

## 2016-06-06 MED ORDER — PALONOSETRON HCL INJECTION 0.25 MG/5ML
0.2500 mg | Freq: Once | INTRAVENOUS | Status: AC
  Administered 2016-06-06: 0.25 mg via INTRAVENOUS

## 2016-06-06 MED ORDER — DIPHENHYDRAMINE HCL 50 MG/ML IJ SOLN
INTRAMUSCULAR | Status: AC
  Filled 2016-06-06: qty 1

## 2016-06-06 MED ORDER — DIPHENHYDRAMINE HCL 50 MG/ML IJ SOLN
50.0000 mg | Freq: Once | INTRAMUSCULAR | Status: AC
  Administered 2016-06-06: 50 mg via INTRAVENOUS

## 2016-06-06 MED ORDER — PALONOSETRON HCL INJECTION 0.25 MG/5ML
INTRAVENOUS | Status: AC
  Filled 2016-06-06: qty 5

## 2016-06-06 MED ORDER — SODIUM CHLORIDE 0.9 % IV SOLN
20.0000 mg | Freq: Once | INTRAVENOUS | Status: AC
  Administered 2016-06-06: 20 mg via INTRAVENOUS
  Filled 2016-06-06: qty 2

## 2016-06-06 MED ORDER — FAMOTIDINE IN NACL 20-0.9 MG/50ML-% IV SOLN
INTRAVENOUS | Status: AC
  Filled 2016-06-06: qty 50

## 2016-06-06 NOTE — Telephone Encounter (Signed)
Per 1/8 LOS I have scheduled appts and gave calendar

## 2016-06-06 NOTE — Progress Notes (Signed)
[facility] [phone]   [phone]  OFFICE PROGRESS NOTE  Gennette Pac, MD [place] [address]  DIAGNOSIS: Stage IIIa (T2a, N2, M0) non-small cell lung cancer, adenocarcinoma presented with left suprahilar mass, mediastinal lymphadenopathy and suspicious pulmonary nodule in the left upper lobe diagnosed in November 2017. No actionable mutations on Guardant 360 testing.  PRIOR THERAPY: None  CURRENT THERAPY: Concurrent chemoradiation with weekly carboplatin for AUC of 2 and paclitaxel 45 MG/M2 status post 5 cycles.  INTERVAL HISTORY: Debbie Baker 51 y.o. female came to the clinic today for follow-up visit. The patient is tolerating her current treatment with concurrent chemoradiation with weekly carboplatin and paclitaxel fairly well except for odynophagia secondary to radiation induced esophagitis. She denied having any significant weight loss. She has no chest pain, shortness of breath, cough or hemoptysis. The patient denied having any nausea or vomiting. She has 2 more weeks of radiation to complete this course. She is here for evaluation before starting cycle #6 of her chemotherapy.  MEDICAL HISTORY: Past Medical History:  Diagnosis Date  . Allergic rhinitis   . Deaf, left   . Diverticulosis   . Dyspnea   . Encounter for antineoplastic chemotherapy 04/18/2016  . GERD (gastroesophageal reflux disease)   . Hyperlipidemia   . Lung cancer ([facility])    non small cell lung cancer favoring adenocarcinoma   . Lung nodules   . Migraine   . Odynophagia 05/24/2016  . Pneumonia   . PONV (postoperative nausea and vomiting)   . Wears glasses     ALLERGIES:  is allergic to penicillins; chantix [varenicline]; and levaquin [levofloxacin].  MEDICATIONS:  Current Outpatient Prescriptions  Medication Sig Dispense Refill  . benzonatate (TESSALON) 100 MG capsule Take 1 capsule (100 mg total) by mouth every 6 (six) hours as  needed for cough. 30 capsule 1  . Calcium-Magnesium-Vitamin D 185-50-100 MG-MG-UNIT CAPS Take 1 tablet by mouth every evening.    . cetirizine (ZYRTEC) 10 MG tablet Take 10 mg by mouth daily.    . diphenhydrAMINE (BENADRYL) 25 mg capsule Take 25 mg by mouth at bedtime as needed.    Marland Kitchen ELDERBERRY PO Take 2 tablets by mouth daily. Chewable Supplement    . HYDROcodone-acetaminophen (HYCET) 7.5-325 mg/15 ml solution Take 15 mLs by mouth 4 (four) times daily as needed for moderate pain. 473 mL 0  . Misc Natural Products (BLACK COHOSH MENOPAUSE COMPLEX) TABS Take 1 capsule by mouth 2 (two) times daily.    . Misc Natural Products (TURMERIC CURCUMIN) CAPS Take 1 capsule by mouth every evening. 1500 mg capsule    . Multiple Vitamins-Minerals (WOMENS 50+ MULTI VITAMIN/MIN) TABS Take 1 tablet by mouth daily.    . Omega-3 Fatty Acids (FISH OIL) 1000 MG CAPS Take 2,000 mg by mouth 2 (two) times daily.    Marland Kitchen OVER THE COUNTER MEDICATION Take 250 mg by mouth daily. Jiaogulan Supplement    . pantoprazole (PROTONIX) 40 MG tablet Take 1 tablet (40 mg total) by mouth every evening.    . pravastatin (PRAVACHOL) 20 MG tablet Take 20 mg by mouth every evening.  3  . prochlorperazine (COMPAZINE) 10 MG tablet Take 1 tablet (10 mg total) by mouth every 6 (six) hours as needed for nausea or vomiting. 30 tablet 0  . ranitidine (ZANTAC) 150 MG tablet Take 150-300 mg by mouth every evening. 300 mg with more acidic meals    . sucralfate (CARAFATE) 1 g  tablet Take 1 tablet (1 g total) by mouth 4 (four) times daily -  with meals and at bedtime. 5 min before meals for radiation induced esophagitis 120 tablet 2  . Wound Cleansers (RADIAPLEX EX) Apply topically.    . naproxen sodium (ANAPROX) 220 MG tablet Take 220-440 mg by mouth 2 (two) times daily as needed (for pain).      No current facility-administered medications for this visit.     SURGICAL HISTORY:  Past Surgical History:  Procedure Laterality Date  . ABDOMINAL  HYSTERECTOMY    . VIDEO BRONCHOSCOPY Bilateral 07/03/2015   Procedure: VIDEO BRONCHOSCOPY WITH FLUORO;  Surgeon: Marshell Garfinkel, MD;  Location: [facility];  Service: Cardiopulmonary;  Laterality: Bilateral;  . VIDEO BRONCHOSCOPY WITH ENDOBRONCHIAL ULTRASOUND N/A 04/06/2016   Procedure: VIDEO BRONCHOSCOPY WITH ENDOBRONCHIAL ULTRASOUND;  Surgeon: Collene Gobble, MD;  Location: [facility];  Service: Thoracic;  Laterality: N/A;  . WRIST FRACTURE SURGERY      REVIEW OF SYSTEMS:  A comprehensive review of systems was negative except for: Constitutional: positive for fatigue Gastrointestinal: positive for odynophagia   PHYSICAL EXAMINATION: General appearance: alert, cooperative, fatigued and no distress Head: Normocephalic, without obvious abnormality, atraumatic Neck: no adenopathy, no JVD, supple, symmetrical, trachea midline and thyroid not enlarged, symmetric, no tenderness/mass/nodules Lymph nodes: Cervical, supraclavicular, and axillary nodes normal. Resp: clear to auscultation bilaterally Back: symmetric, no curvature. ROM normal. No CVA tenderness. Cardio: regular rate and rhythm, S1, S2 normal, no murmur, click, rub or gallop GI: soft, non-tender; bowel sounds normal; no masses,  no organomegaly Extremities: extremities normal, atraumatic, no cyanosis or edema  ECOG PERFORMANCE STATUS: 1 - Symptomatic but completely ambulatory  Blood pressure 109/74, pulse (!) 115, temperature 98.3 F (36.8 C), temperature source Oral, resp. rate 16, height '5\' 8"'$  (1.727 m), weight 192 lb 9.6 oz (87.4 kg), SpO2 98 %.  LABORATORY DATA: Lab Results  Component Value Date   WBC 4.2 06/06/2016   HGB 14.1 06/06/2016   HCT 40.5 06/06/2016   MCV 92.0 06/06/2016   PLT 207 06/06/2016      Chemistry      Component Value Date/Time   NA 140 06/06/2016 1104   K 3.7 06/06/2016 1104   CL 107 04/06/2016 0742   CO2 24 06/06/2016 1104   BUN 6.0 (L) 06/06/2016 1104   CREATININE 0.7 06/06/2016 1104        Component Value Date/Time   CALCIUM 9.7 06/06/2016 1104   ALKPHOS 108 06/06/2016 1104   AST 42 (H) 06/06/2016 1104   ALT 75 (H) 06/06/2016 1104   BILITOT 0.45 06/06/2016 1104       RADIOGRAPHIC STUDIES: No results found.  ASSESSMENT AND PLAN:  This is a very pleasant 51 years old white female with a stage IIIa non-small cell lung cancer. She is currently undergoing a course of concurrent chemoradiation with weekly carboplatin and paclitaxel. She status post 5 cycles of chemotherapy. She is tolerating the treatment well except for odynophagia. I recommended for the patient to proceed with cycle #6 today as scheduled. She will finish cycle #7 next week. For the odynophagia the patient will continue on Carafate and pain medication. I recommended for the patient to come back for follow-up visit in around 6 weeks with repeat CT scan of the chest for restaging of her disease. She was advised to call immediately if she has any concerning symptoms in the interval. The patient voices understanding of current disease status and treatment options and is in agreement with  the current care plan. All questions were answered. The patient knows to call the clinic with any problems, questions or concerns. We can certainly see the patient much sooner if necessary. I spent 10 minutes counseling the patient face to face. The total time spent in the appointment was 15 minutes.  Disclaimer: This note was dictated with voice recognition software. Similar sounding words can inadvertently be transcribed and may not be corrected upon review.

## 2016-06-06 NOTE — Telephone Encounter (Signed)
Appointments scheduled per 1/8 LOS. Patient given AVS report and calendars with future scheduled appointments.  °

## 2016-06-07 ENCOUNTER — Ambulatory Visit
Admission: RE | Admit: 2016-06-07 | Discharge: 2016-06-07 | Disposition: A | Discharge status: Home / Self Care | Payer: No Typology Code available for payment source | Source: Ambulatory Visit | Attending: Radiation Oncology | Admitting: Radiation Oncology

## 2016-06-07 DIAGNOSIS — Z510 Encounter for antineoplastic radiation therapy: Secondary | ICD-10-CM | POA: Diagnosis not present

## 2016-06-08 ENCOUNTER — Ambulatory Visit
Admission: RE | Admit: 2016-06-08 | Discharge: 2016-06-08 | Disposition: A | Discharge status: Home / Self Care | Payer: No Typology Code available for payment source | Source: Ambulatory Visit | Attending: Radiation Oncology | Admitting: Radiation Oncology

## 2016-06-08 DIAGNOSIS — Z510 Encounter for antineoplastic radiation therapy: Secondary | ICD-10-CM | POA: Diagnosis not present

## 2016-06-09 ENCOUNTER — Ambulatory Visit
Admission: RE | Admit: 2016-06-09 | Discharge: 2016-06-09 | Disposition: A | Discharge status: Home / Self Care | Payer: No Typology Code available for payment source | Source: Ambulatory Visit | Attending: Radiation Oncology | Admitting: Radiation Oncology

## 2016-06-09 DIAGNOSIS — C3492 Malignant neoplasm of unspecified part of left bronchus or lung: Secondary | ICD-10-CM

## 2016-06-09 DIAGNOSIS — Z510 Encounter for antineoplastic radiation therapy: Secondary | ICD-10-CM | POA: Diagnosis not present

## 2016-06-09 MED ORDER — HYDROCODONE-ACETAMINOPHEN 7.5-325 MG/15ML PO SOLN
15.0000 mL | Freq: Four times a day (QID) | ORAL | 0 refills | Status: DC | PRN
Start: 2016-06-09 — End: 2016-08-25

## 2016-06-09 NOTE — Progress Notes (Signed)
  Radiation Oncology         [phone]   Name: Debbie Baker MRN: [id number]   Date: 06/09/2016  DOB: [birth year]     Weekly Radiation Therapy Management    ICD-9-CM ICD-10-CM   1. Adenocarcinoma of left lung, stage 3 (HCC) 162.9 C34.92 HYDROcodone-acetaminophen (HYCET) 7.5-325 mg/15 ml solution    Current Dose: 54 Gy  Planned Dose:  66 Gy  Narrative The patient presents for routine under treatment assessment.  Weight and vitals stable. Patient reports noticing her tonsils were erythematous. She also reported an associated sore throat.   Set-up films were reviewed. The chart was checked.  Physical Findings Patient initially tachycardic, but, this improved with rest.  No significant changes. Alert, in no acute distress. No thrush noted.  Impression The patient is tolerating radiation.   Plan Continue treatment as planned. Advised patient to drink plenty of fluids for sore throat. Refill for Vicodin filled today.     Sheral Apley Tammi Klippel, M.D.  This document serves as a record of services personally performed by Tyler Pita, MD. It was created on his behalf by Bethann Humble, a trained medical scribe. The creation of this record is based on the scribe's personal observations and the provider's statements to them. This document has been checked and approved by the attending provider.

## 2016-06-10 ENCOUNTER — Ambulatory Visit
Admission: RE | Admit: 2016-06-10 | Discharge: 2016-06-10 | Disposition: A | Discharge status: Home / Self Care | Payer: No Typology Code available for payment source | Source: Ambulatory Visit | Attending: Radiation Oncology | Admitting: Radiation Oncology

## 2016-06-10 ENCOUNTER — Other Ambulatory Visit: Payer: Self-pay | Admitting: Medical Oncology

## 2016-06-10 DIAGNOSIS — Z510 Encounter for antineoplastic radiation therapy: Secondary | ICD-10-CM | POA: Diagnosis not present

## 2016-06-10 DIAGNOSIS — C3492 Malignant neoplasm of unspecified part of left bronchus or lung: Secondary | ICD-10-CM

## 2016-06-13 ENCOUNTER — Ambulatory Visit
Admission: RE | Admit: 2016-06-13 | Discharge: 2016-06-13 | Disposition: A | Discharge status: Home / Self Care | Payer: No Typology Code available for payment source | Source: Ambulatory Visit | Attending: Radiation Oncology | Admitting: Radiation Oncology

## 2016-06-13 ENCOUNTER — Ambulatory Visit (HOSPITAL_BASED_OUTPATIENT_CLINIC_OR_DEPARTMENT_OTHER): Payer: No Typology Code available for payment source

## 2016-06-13 ENCOUNTER — Other Ambulatory Visit (HOSPITAL_BASED_OUTPATIENT_CLINIC_OR_DEPARTMENT_OTHER): Payer: No Typology Code available for payment source

## 2016-06-13 VITALS — BP 94/73 | HR 99 | Temp 98.6°F | Resp 16

## 2016-06-13 DIAGNOSIS — Z5111 Encounter for antineoplastic chemotherapy: Secondary | ICD-10-CM | POA: Diagnosis not present

## 2016-06-13 DIAGNOSIS — C3492 Malignant neoplasm of unspecified part of left bronchus or lung: Secondary | ICD-10-CM | POA: Diagnosis not present

## 2016-06-13 DIAGNOSIS — Z510 Encounter for antineoplastic radiation therapy: Secondary | ICD-10-CM | POA: Diagnosis not present

## 2016-06-13 LAB — COMPREHENSIVE METABOLIC PANEL
ALK PHOS: 95 U/L (ref 40–150)
ALT: 53 U/L (ref 0–55)
ANION GAP: 11 meq/L (ref 3–11)
AST: 28 U/L (ref 5–34)
Albumin: 3.8 g/dL (ref 3.5–5.0)
BUN: 5.6 mg/dL — ABNORMAL LOW (ref 7.0–26.0)
CO2: 24 meq/L (ref 22–29)
Calcium: 9.8 mg/dL (ref 8.4–10.4)
Chloride: 107 mEq/L (ref 98–109)
Creatinine: 0.7 mg/dL (ref 0.6–1.1)
Glucose: 104 mg/dl (ref 70–140)
Potassium: 3.9 mEq/L (ref 3.5–5.1)
Sodium: 142 mEq/L (ref 136–145)
TOTAL PROTEIN: 7 g/dL (ref 6.4–8.3)
Total Bilirubin: 0.62 mg/dL (ref 0.20–1.20)

## 2016-06-13 LAB — CBC WITH DIFFERENTIAL/PLATELET
BASO%: 0.9 % (ref 0.0–2.0)
Basophils Absolute: 0 10*3/uL (ref 0.0–0.1)
EOS ABS: 0.1 10*3/uL (ref 0.0–0.5)
EOS%: 1.3 % (ref 0.0–7.0)
HCT: 40.3 % (ref 34.8–46.6)
HGB: 13.8 g/dL (ref 11.6–15.9)
LYMPH%: 13 % — AB (ref 14.0–49.7)
MCH: 31.9 pg (ref 25.1–34.0)
MCHC: 34.2 g/dL (ref 31.5–36.0)
MCV: 93.3 fL (ref 79.5–101.0)
MONO#: 0.4 10*3/uL (ref 0.1–0.9)
MONO%: 9.7 % (ref 0.0–14.0)
NEUT#: 3.4 10*3/uL (ref 1.5–6.5)
NEUT%: 75.1 % (ref 38.4–76.8)
PLATELETS: 183 10*3/uL (ref 145–400)
RBC: 4.32 10*6/uL (ref 3.70–5.45)
RDW: 13 % (ref 11.2–14.5)
WBC: 4.6 10*3/uL (ref 3.9–10.3)
lymph#: 0.6 10*3/uL — ABNORMAL LOW (ref 0.9–3.3)

## 2016-06-13 MED ORDER — PACLITAXEL CHEMO INJECTION 300 MG/50ML
45.0000 mg/m2 | Freq: Once | INTRAVENOUS | Status: AC
  Administered 2016-06-13: 96 mg via INTRAVENOUS
  Filled 2016-06-13: qty 16

## 2016-06-13 MED ORDER — SODIUM CHLORIDE 0.9 % IV SOLN
284.2000 mg | Freq: Once | INTRAVENOUS | Status: AC
  Administered 2016-06-13: 280 mg via INTRAVENOUS
  Filled 2016-06-13: qty 28

## 2016-06-13 MED ORDER — DEXAMETHASONE SODIUM PHOSPHATE 100 MG/10ML IJ SOLN
20.0000 mg | Freq: Once | INTRAMUSCULAR | Status: AC
  Administered 2016-06-13: 20 mg via INTRAVENOUS
  Filled 2016-06-13: qty 2

## 2016-06-13 MED ORDER — PALONOSETRON HCL INJECTION 0.25 MG/5ML
INTRAVENOUS | Status: AC
  Filled 2016-06-13: qty 5

## 2016-06-13 MED ORDER — DIPHENHYDRAMINE HCL 50 MG/ML IJ SOLN
50.0000 mg | Freq: Once | INTRAMUSCULAR | Status: AC
  Administered 2016-06-13: 50 mg via INTRAVENOUS

## 2016-06-13 MED ORDER — SODIUM CHLORIDE 0.9 % IV SOLN
Freq: Once | INTRAVENOUS | Status: AC
  Administered 2016-06-13: 09:00:00 via INTRAVENOUS

## 2016-06-13 MED ORDER — PALONOSETRON HCL INJECTION 0.25 MG/5ML
0.2500 mg | Freq: Once | INTRAVENOUS | Status: AC
  Administered 2016-06-13: 0.25 mg via INTRAVENOUS

## 2016-06-13 MED ORDER — DIPHENHYDRAMINE HCL 50 MG/ML IJ SOLN
INTRAMUSCULAR | Status: AC
  Filled 2016-06-13: qty 1

## 2016-06-13 MED ORDER — FAMOTIDINE IN NACL 20-0.9 MG/50ML-% IV SOLN
20.0000 mg | Freq: Once | INTRAVENOUS | Status: AC
  Administered 2016-06-13: 20 mg via INTRAVENOUS

## 2016-06-13 MED ORDER — FAMOTIDINE IN NACL 20-0.9 MG/50ML-% IV SOLN
INTRAVENOUS | Status: AC
Start: 2016-06-13 — End: 2016-06-13
  Filled 2016-06-13: qty 50

## 2016-06-13 NOTE — Patient Instructions (Signed)
[facility] Discharge Instructions for Patients Receiving Chemotherapy  Today you received the following chemotherapy agents Taxol and Carboplatin  To help prevent nausea and vomiting after your treatment, we encourage you to take your nausea medication as directed. No Zofran for 3 days. Take Compazine instead.   If you develop nausea and vomiting that is not controlled by your nausea medication, call the clinic.   BELOW ARE SYMPTOMS THAT SHOULD BE REPORTED IMMEDIATELY:  *FEVER GREATER THAN 100.5 F  *CHILLS WITH OR WITHOUT FEVER  NAUSEA AND VOMITING THAT IS NOT CONTROLLED WITH YOUR NAUSEA MEDICATION  *UNUSUAL SHORTNESS OF BREATH  *UNUSUAL BRUISING OR BLEEDING  TENDERNESS IN MOUTH AND THROAT WITH OR WITHOUT PRESENCE OF ULCERS  *URINARY PROBLEMS  *BOWEL PROBLEMS  UNUSUAL RASH Items with * indicate a potential emergency and should be followed up as soon as possible.  Feel free to call the clinic you have any questions or concerns. The clinic phone number is [phone].  Please show the CHEMO ALERT CARD at check-in to the Emergency Department and triage nurse.   

## 2016-06-14 ENCOUNTER — Ambulatory Visit
Admission: RE | Admit: 2016-06-14 | Discharge: 2016-06-14 | Disposition: A | Discharge status: Home / Self Care | Payer: No Typology Code available for payment source | Source: Ambulatory Visit | Attending: Radiation Oncology | Admitting: Radiation Oncology

## 2016-06-14 DIAGNOSIS — Z510 Encounter for antineoplastic radiation therapy: Secondary | ICD-10-CM | POA: Diagnosis not present

## 2016-06-15 ENCOUNTER — Ambulatory Visit
Admission: RE | Admit: 2016-06-15 | Discharge: 2016-06-15 | Disposition: A | Discharge status: Home / Self Care | Payer: No Typology Code available for payment source | Source: Ambulatory Visit | Attending: Radiation Oncology | Admitting: Radiation Oncology

## 2016-06-15 DIAGNOSIS — Z510 Encounter for antineoplastic radiation therapy: Secondary | ICD-10-CM | POA: Diagnosis not present

## 2016-06-16 ENCOUNTER — Ambulatory Visit
Admission: RE | Admit: 2016-06-16 | Discharge: 2016-06-16 | Disposition: A | Discharge status: Home / Self Care | Payer: No Typology Code available for payment source | Source: Ambulatory Visit | Attending: Radiation Oncology | Admitting: Radiation Oncology

## 2016-06-16 VITALS — BP 108/79 | HR 110 | Temp 98.8°F | Resp 19 | Wt 189.6 lb

## 2016-06-16 DIAGNOSIS — Z510 Encounter for antineoplastic radiation therapy: Secondary | ICD-10-CM | POA: Diagnosis not present

## 2016-06-16 DIAGNOSIS — C3492 Malignant neoplasm of unspecified part of left bronchus or lung: Secondary | ICD-10-CM

## 2016-06-16 NOTE — Progress Notes (Signed)
  Radiation Oncology         [phone]   Name: Debbie Baker MRN: [id number]   Date: 06/16/2016  DOB: [birth year]     Weekly Radiation Therapy Management    ICD-9-CM ICD-10-CM   1. Adenocarcinoma of left lung, stage 3 (HCC) 162.9 C34.92     Current Dose: 64 Gy  Planned Dose:  66 Gy  Narrative The patient presents for routine under treatment assessment.  Weight and vitals stable. Patient denies pain. She reports esophagitis continues. She reports taking Hycet as directed. Reports a productive cough with scant hemoptysis. Reports nasal congestion began yesterday. Per nursing, hyperpigmentation within treatment field without desquamation. Reports using Radiaplex as directed. She understands to continue using Radiaplex for 2 weeks. She denies a headache today, but reports having one last night. She denies nausea, vomiting, or dizziness. She reports Monday was her last chemotherapy.   Set-up films were reviewed. The chart was checked.  Physical Findings   Vitals with BMI 06/16/2016  Height   Weight 189 lbs 10 oz  BMI   Systolic 163  Diastolic 79  Pulse 846  Respirations 19  No significant changes. Alert, in no acute distress.  Impression The patient is tolerating radiation.   Plan One month follow up appointment card given. Scheduled to follow up with Dr. Julien Nordmann on 07/21/16.     Sheral Apley Tammi Klippel, M.D.  This document serves as a record of services personally performed by Tyler Pita, MD. It was created on his behalf by Bethann Humble, a trained medical scribe. The creation of this record is based on the scribe's personal observations and the provider's statements to them. This document has been checked and approved by the attending provider.

## 2016-06-16 NOTE — Progress Notes (Signed)
Weight and vitals stable. Denies pain. Reports esophagitis continues. Reports she continues to take hycet as directed. Reports a productive cough with scant hemoptysis. Reports nasal congestion began yesterday. Hyperpigmentation within treatment field without desquamation. Reports using radiaplex as directed. Understands to continue use of radiaplex bid for the next two weeks. Denies having a headache today but, had a severe one last night. Denies nausea, vomiting or dizziness. Reports Monday was her last chemotherapy. Scheduled for follow up with Dr. Julien Nordmann 2/22. One month follow up appointment card given.   BP 108/79 (BP Location: Right Arm, Patient Position: Sitting, Cuff Size: Normal)   Pulse (!) 110   Temp 98.8 F (37.1 C) (Oral)   Resp 19   Wt 189 lb 9.6 oz (86 kg)   SpO2 97%   BMI 28.83 kg/m  Wt Readings from Last 3 Encounters:  06/16/16 189 lb 9.6 oz (86 kg)  06/06/16 192 lb 9.6 oz (87.4 kg)  06/03/16 192 lb 9.6 oz (87.4 kg)

## 2016-06-17 ENCOUNTER — Ambulatory Visit
Admission: RE | Admit: 2016-06-17 | Discharge: 2016-06-17 | Disposition: A | Discharge status: Home / Self Care | Payer: No Typology Code available for payment source | Source: Ambulatory Visit | Attending: Radiation Oncology | Admitting: Radiation Oncology

## 2016-06-17 ENCOUNTER — Encounter: Payer: No Typology Code available for payment source | Admitting: Radiation Oncology

## 2016-06-17 ENCOUNTER — Encounter: Payer: Self-pay | Admitting: Radiation Oncology

## 2016-06-17 DIAGNOSIS — Z510 Encounter for antineoplastic radiation therapy: Secondary | ICD-10-CM | POA: Diagnosis not present

## 2016-06-19 NOTE — Progress Notes (Signed)
  Radiation Oncology         [phone]) [phone] ________________________________  Name: Debbie Baker MRN: [id number]  Date: 06/17/2016  DOB: [birth year]   End of Treatment Note  Diagnosis:   51 yo woman with stage T2a N2 M0 adenocarcinoma of the left upper lung - Stage IIIA     Indication for treatment:  Curative, Chemo-Radiotherapy       Radiation treatment dates:   05/02/16-06/17/16  Site/dose:   The primary tumor and involved mediastinal adenopathy were treated to 66 Gy in 33 fractions of 2 Gy.  Beams/energy:   A five field 3D conformal treatment arrangement was used delivering 6 and 10 MV photons.  Daily image-guidance CT was used to align the treatment with the targeted volume  Narrative: The patient tolerated radiation treatment relatively well.  The patient experienced some esophagitis characterized as mild.  The patient also noted fatigue.  Plan: The patient has completed radiation treatment. The patient will return to radiation oncology clinic for routine followup in one month. I advised her to call or return sooner if she has any questions or concerns related to her recovery or treatment.  ________________________________  Sheral Apley. Tammi Klippel, M.D.

## 2016-06-20 ENCOUNTER — Other Ambulatory Visit: Payer: No Typology Code available for payment source

## 2016-06-20 ENCOUNTER — Ambulatory Visit: Payer: No Typology Code available for payment source

## 2016-06-20 ENCOUNTER — Ambulatory Visit: Payer: No Typology Code available for payment source | Admitting: Internal Medicine

## 2016-06-21 ENCOUNTER — Telehealth: Payer: Self-pay | Admitting: Radiation Oncology

## 2016-06-21 NOTE — Telephone Encounter (Signed)
Returned patient's call. Patient reports she is unable to fill her Hycet due to insurance constraints. Phoned Jeneen Rinks at [facility] [phone]). Provided valid diagnosis codes. Jeneen Rinks reports Hycet immediately approved for coverage. Phoned patient back. Directed her to take script back to pharmacy to be filled because insurance will now cover it. She verbalized understanding and expressed appreciation for the aid.

## 2016-06-27 ENCOUNTER — Other Ambulatory Visit: Payer: No Typology Code available for payment source

## 2016-06-27 ENCOUNTER — Ambulatory Visit: Payer: No Typology Code available for payment source

## 2016-07-04 ENCOUNTER — Ambulatory Visit: Payer: No Typology Code available for payment source | Admitting: Internal Medicine

## 2016-07-04 ENCOUNTER — Other Ambulatory Visit: Payer: No Typology Code available for payment source

## 2016-07-04 ENCOUNTER — Ambulatory Visit: Payer: No Typology Code available for payment source

## 2016-07-13 ENCOUNTER — Telehealth: Payer: Self-pay | Admitting: Radiation Oncology

## 2016-07-13 NOTE — Telephone Encounter (Signed)
Patient left message requesting return call. Phoned patient back. Patient reports that following completion of radiation she had a cough with clear thin sputum. She goes onto explain that recently her cough has become more frequent and productive with very thick yellow sputum. She denies hemoptysis. Reports SOB is unchanged. She denies fever. She reports that she received a flu shot on Monday. Reports it is very hard to "bring up" phylum from her cough. She goes onto explain that sometimes she coughs so frequently and hard back to back she becomes dizzy. Explained this RN will inform the providers of this finding and call her back with future directions.

## 2016-07-15 ENCOUNTER — Telehealth: Payer: Self-pay | Admitting: Radiation Oncology

## 2016-07-15 NOTE — Telephone Encounter (Signed)
Phoned patient back to offer her an appointment with Dr. Tammi Klippel this afternoon and possibly a chest xray. Patient reports she feels marginally better today. She denies a fever but, continues to report a productive cough with yellow sputum. Patient confirms she will be present for chest CT on Monday. She understands this RN will be on the look out for the results of her scan and phone her with abnormalities.

## 2016-07-18 ENCOUNTER — Ambulatory Visit (HOSPITAL_COMMUNITY)
Admission: RE | Admit: 2016-07-18 | Discharge: 2016-07-18 | Disposition: A | Discharge status: Home / Self Care | Payer: No Typology Code available for payment source | Source: Ambulatory Visit | Attending: Internal Medicine | Admitting: Internal Medicine

## 2016-07-18 ENCOUNTER — Other Ambulatory Visit (HOSPITAL_BASED_OUTPATIENT_CLINIC_OR_DEPARTMENT_OTHER): Payer: No Typology Code available for payment source

## 2016-07-18 ENCOUNTER — Telehealth: Payer: Self-pay | Admitting: Radiation Oncology

## 2016-07-18 ENCOUNTER — Encounter (HOSPITAL_COMMUNITY): Payer: Self-pay

## 2016-07-18 DIAGNOSIS — C3492 Malignant neoplasm of unspecified part of left bronchus or lung: Secondary | ICD-10-CM | POA: Diagnosis not present

## 2016-07-18 DIAGNOSIS — R1310 Dysphagia, unspecified: Secondary | ICD-10-CM | POA: Insufficient documentation

## 2016-07-18 DIAGNOSIS — J90 Pleural effusion, not elsewhere classified: Secondary | ICD-10-CM | POA: Diagnosis not present

## 2016-07-18 DIAGNOSIS — J439 Emphysema, unspecified: Secondary | ICD-10-CM | POA: Insufficient documentation

## 2016-07-18 DIAGNOSIS — Z5111 Encounter for antineoplastic chemotherapy: Secondary | ICD-10-CM

## 2016-07-18 LAB — CBC WITH DIFFERENTIAL/PLATELET
BASO%: 1 % (ref 0.0–2.0)
Basophils Absolute: 0.1 10*3/uL (ref 0.0–0.1)
EOS%: 2 % (ref 0.0–7.0)
Eosinophils Absolute: 0.1 10*3/uL (ref 0.0–0.5)
HEMATOCRIT: 38.1 % (ref 34.8–46.6)
HEMOGLOBIN: 13.2 g/dL (ref 11.6–15.9)
LYMPH#: 0.9 10*3/uL (ref 0.9–3.3)
LYMPH%: 15.5 % (ref 14.0–49.7)
MCH: 33.4 pg (ref 25.1–34.0)
MCHC: 34.5 g/dL (ref 31.5–36.0)
MCV: 96.6 fL (ref 79.5–101.0)
MONO#: 0.6 10*3/uL (ref 0.1–0.9)
MONO%: 10.4 % (ref 0.0–14.0)
NEUT#: 4.1 10*3/uL (ref 1.5–6.5)
NEUT%: 71.1 % (ref 38.4–76.8)
Platelets: 260 10*3/uL (ref 145–400)
RBC: 3.94 10*6/uL (ref 3.70–5.45)
RDW: 15 % — AB (ref 11.2–14.5)
WBC: 5.7 10*3/uL (ref 3.9–10.3)

## 2016-07-18 LAB — COMPREHENSIVE METABOLIC PANEL
ALBUMIN: 3.7 g/dL (ref 3.5–5.0)
ALT: 44 U/L (ref 0–55)
AST: 28 U/L (ref 5–34)
Alkaline Phosphatase: 97 U/L (ref 40–150)
Anion Gap: 11 mEq/L (ref 3–11)
BUN: 8.4 mg/dL (ref 7.0–26.0)
CALCIUM: 9.9 mg/dL (ref 8.4–10.4)
CHLORIDE: 105 meq/L (ref 98–109)
CO2: 25 mEq/L (ref 22–29)
CREATININE: 0.8 mg/dL (ref 0.6–1.1)
EGFR: 89 mL/min/{1.73_m2} — ABNORMAL LOW (ref >90)
GLUCOSE: 94 mg/dL (ref 70–140)
Potassium: 4.1 mEq/L (ref 3.5–5.1)
SODIUM: 140 meq/L (ref 136–145)
Total Bilirubin: 0.59 mg/dL (ref 0.20–1.20)
Total Protein: 7.3 g/dL (ref 6.4–8.3)

## 2016-07-18 MED ORDER — SODIUM CHLORIDE 0.9 % IJ SOLN
INTRAMUSCULAR | Status: AC
  Filled 2016-07-18: qty 50

## 2016-07-18 MED ORDER — IOPAMIDOL (ISOVUE-300) INJECTION 61%
INTRAVENOUS | Status: AC
  Filled 2016-07-18: qty 75

## 2016-07-18 MED ORDER — IOPAMIDOL (ISOVUE-300) INJECTION 61%
75.0000 mL | Freq: Once | INTRAVENOUS | Status: AC | PRN
  Administered 2016-07-18: 75 mL via INTRAVENOUS

## 2016-07-18 NOTE — Telephone Encounter (Signed)
Phoned patient. Requested she present tomorrow at 1100 for follow up to rule out radiation pneumonitis. Patient reports a low grade fever and persistent cough continues. Patient confirms appointment and verbalizes appreciation for the call.

## 2016-07-19 ENCOUNTER — Encounter: Payer: Self-pay | Admitting: Radiation Oncology

## 2016-07-19 ENCOUNTER — Ambulatory Visit
Admission: RE | Admit: 2016-07-19 | Discharge: 2016-07-19 | Disposition: A | Discharge status: Home / Self Care | Payer: No Typology Code available for payment source | Source: Ambulatory Visit | Attending: Radiation Oncology | Admitting: Radiation Oncology

## 2016-07-19 VITALS — BP 119/84 | HR 94 | Temp 98.5°F | Resp 18 | Ht 68.0 in | Wt 190.0 lb

## 2016-07-19 DIAGNOSIS — R1310 Dysphagia, unspecified: Secondary | ICD-10-CM

## 2016-07-19 DIAGNOSIS — C3412 Malignant neoplasm of upper lobe, left bronchus or lung: Secondary | ICD-10-CM | POA: Insufficient documentation

## 2016-07-19 DIAGNOSIS — R590 Localized enlarged lymph nodes: Secondary | ICD-10-CM | POA: Diagnosis not present

## 2016-07-19 DIAGNOSIS — C3492 Malignant neoplasm of unspecified part of left bronchus or lung: Secondary | ICD-10-CM

## 2016-07-19 DIAGNOSIS — Z5111 Encounter for antineoplastic chemotherapy: Secondary | ICD-10-CM

## 2016-07-19 MED ORDER — PREDNISONE 10 MG PO TABS: ORAL_TABLET | ORAL | 0 refills | Status: DC

## 2016-07-19 MED ORDER — DOXYCYCLINE HYCLATE 100 MG PO TABS: 100.0000 mg | ORAL_TABLET | Freq: Two times a day (BID) | ORAL | 0 refills | Status: DC

## 2016-07-19 MED ORDER — BENZONATATE 100 MG PO CAPS: 100.0000 mg | ORAL_CAPSULE | ORAL | 1 refills | Status: DC | PRN

## 2016-07-19 NOTE — Progress Notes (Addendum)
Debbie Baker 51 y.o woman with 51 yo woman with stage T2a N2 M0 adenocarcinoma of the left upper lung - Stage IIIA  Radiation completed review 07-18-16 CT chest w contrast 06-17-16 FU.  Weight changes, if any: Wt Readings from Last 3 Encounters:  07/19/16 190 lb (86.2 kg)  06/16/16 189 lb 9.6 oz (86 kg)  06/06/16 192 lb 9.6 oz (87.4 kg)   Respiratory complaints, if any: SOB,coughing  yellow color since early last week, wheezing Hemoptysis, if XUX:YBFX   Swallowing Problems/Pain/Difficulty swallowing:starting away from hot and cold caused problems to her esophagus coughing sine her radiation Smoking Tobacco/Marijuana/Snuff/ETOH use:Former smoker 1.5 P/D quit 08-02-10 user of alcohol and marijuana Appetite :Has an appetite amount that she eats had decreased Pain:None When is next chemo scheduled?:Concurrent chemoradiation with weekly carboplatin for AUC of 2 and paclitaxel 45 MG/M2 status post 5 cycles Lab work from of chart:02-19- CBC w diff,Cmet Imaging:07-18-16 CT chest w contrast BP 119/84   Pulse 94   Temp 98.5 F (36.9 C) (Oral)   Resp 18   Ht '5\' 8"'$  (1.727 m)   Wt 190 lb (86.2 kg)   SpO2 100%   BMI 28.89 kg/m

## 2016-07-19 NOTE — Progress Notes (Signed)
Radiation Oncology         [phone] ________________________________  Name: BRYNLYNN WALKO MRN: [id number]  Date: 07/19/2016  DOB: 1966/04/05  Post Treatment Note  CC: Gennette Pac, MD  Hulan Fess, MD  Diagnosis:   Stage IIIA, T2a, N2, M0 NSCLC, adenocarcinoma of the left upper lobe  Interval Since Last Radiation:  4 weeks   05/02/16-06/17/16: The primary tumor and involved mediastinal adenopathy were treated to 66 Gy in 33 fractions of 2 Gy.  Narrative:  The patient returns today for routine follow-up. She had been experiencing persistent cough, and  more productive and purulent mucus in the last week, she contacted our office and we recommended that she undergo her imaging which was scheduled for yesterday. A CT scan with contrast did reveal concerns with inflammatory changes within the left upper lobe, and  partial response in the mediastinal region.  On review of systems, the patient states she is doing okay but states that she has become short of breath with minimal activity, has noticed progressive cough, and has been more fatigued, and has noticed 3 elevated temperatures within last 24 hours with the highest being 100.2F. She denies any emesis, chest pain, or lower extremity edema. No other complaints or verbalized.  ALLERGIES:  is allergic to penicillins; chantix [varenicline]; and levaquin [levofloxacin].  Meds: Current Outpatient Prescriptions  Medication Sig Dispense Refill  . benzonatate (TESSALON) 100 MG capsule Take 1 capsule (100 mg total) by mouth every 4 (four) hours as needed for cough. 60 capsule 1  . Calcium-Magnesium-Vitamin D 185-50-100 MG-MG-UNIT CAPS Take 1 tablet by mouth every evening.    . cetirizine (ZYRTEC) 10 MG tablet Take 10 mg by mouth daily.    Marland Kitchen ELDERBERRY PO Take 2 tablets by mouth daily. Chewable Supplement    . HYDROcodone-acetaminophen (HYCET) 7.5-325 mg/15 ml solution Take 15 mLs by mouth 4 (four) times daily as needed for moderate pain.  473 mL 0  . Misc Natural Products (BLACK COHOSH MENOPAUSE COMPLEX) TABS Take 1 capsule by mouth 2 (two) times daily.    . Misc Natural Products (TURMERIC CURCUMIN) CAPS Take 1 capsule by mouth every evening. 1500 mg capsule    . Multiple Vitamins-Minerals (WOMENS 50+ MULTI VITAMIN/MIN) TABS Take 1 tablet by mouth daily.    . naproxen sodium (ANAPROX) 220 MG tablet Take 220-440 mg by mouth 2 (two) times daily as needed (for pain).     Marland Kitchen OVER THE COUNTER MEDICATION Take 250 mg by mouth daily. Jiaogulan Supplement    . pantoprazole (PROTONIX) 40 MG tablet Take 1 tablet (40 mg total) by mouth every evening.    . pravastatin (PRAVACHOL) 20 MG tablet Take 20 mg by mouth every evening.  3  . ranitidine (ZANTAC) 150 MG tablet Take 150-300 mg by mouth every evening. 300 mg with more acidic meals    . diphenhydrAMINE (BENADRYL) 25 mg capsule Take 25 mg by mouth at bedtime as needed.    . doxycycline (VIBRA-TABS) 100 MG tablet Take 1 tablet (100 mg total) by mouth 2 (two) times daily. 14 tablet 0  . Omega-3 Fatty Acids (FISH OIL) 1000 MG CAPS Take 2,000 mg by mouth 2 (two) times daily.    . predniSONE (DELTASONE) 10 MG tablet Take 6 tabs po daily x 5 days, 5 tabs po x 5 days, 4 tabs po x 5 days, 3 tabs po x 5 days, 2 tabs po x 5 days, 1 tab po x5 days, then dc 105 tablet 0  .  prochlorperazine (COMPAZINE) 10 MG tablet Take 1 tablet (10 mg total) by mouth every 6 (six) hours as needed for nausea or vomiting. (Patient not taking: Reported on 07/19/2016) 30 tablet 0  . sucralfate (CARAFATE) 1 g tablet Take 1 tablet (1 g total) by mouth 4 (four) times daily -  with meals and at bedtime. 5 min before meals for radiation induced esophagitis (Patient not taking: Reported on 07/19/2016) 120 tablet 2   No current facility-administered medications for this encounter.     Physical Findings:  height is '5\' 8"'$  (1.727 m) and weight is 190 lb (86.2 kg). Her oral temperature is 98.5 F (36.9 C). Her blood pressure is 119/84  and her pulse is 94. Her respiration is 18 and oxygen saturation is 100%.  Pain Assessment Pain Score: 0-No pain/10 In general this is a well appearing Caucasian female in no acute distress. She's alert and oriented x4 and appropriate throughout the examination. Cardiopulmonary assessment is negative for acute distress and she exhibits normal effort. Chest is clear to auscultation on the right, minimal crackles are noted in the left upper lung region. No evidence of lower extremity edema was noted bilaterally.   Lab Findings: Lab Results  Component Value Date   WBC 5.7 07/18/2016   HGB 13.2 07/18/2016   HCT 38.1 07/18/2016   MCV 96.6 07/18/2016   PLT 260 07/18/2016     Radiographic Findings: Ct Chest W Contrast  Result Date: 07/18/2016 CLINICAL DATA:  51 year old female with history of stage III left-sided lung adenocarcinoma status post radiation therapy (completed 4 weeks ago) and chemotherapy (completed 5 weeks ago). Followup study. EXAM: CT CHEST WITH CONTRAST TECHNIQUE: Multidetector CT imaging of the chest was performed during intravenous contrast administration. CONTRAST:  72m ISOVUE-300 IOPAMIDOL (ISOVUE-300) INJECTION 61% COMPARISON:  Multiple priors, most recently PET-CT 04/04/2016 and chest CT 03/21/2016). FINDINGS: Cardiovascular: Heart size is normal. There is no significant pericardial fluid, thickening or pericardial calcification. No atherosclerotic calcifications are identified within the thoracic aorta or the coronary arteries. Mediastinum/Nodes: There is some amorphous soft tissue in the left hilar region and low left paratracheal nodal stations (which directly correspond to the areas of hypermetabolic lymphadenopathy noted on prior PET-CT 04/04/2016), which likely reflects residual partially treated lymphadenopathy. This is quite thick in regions measuring up to 2 cm in thickness in the anterior aspect of the left hilum (image 57 of series 2) and 1.6 cm in thickness in the low  left paratracheal nodal station. No other isolated mediastinal or right hilar lymphadenopathy is noted. Esophagus is unremarkable in appearance. No axillary lymphadenopathy. Lungs/Pleura: Evolving post treatment related changes are noted throughout the left lung centered around the treated left upper lobe mass. These changes include mass-like areas of apparent airspace consolidation with surrounding ground-glass attenuation and increasing areas of septal thickening throughout the left lung, most evident in the left upper lobe. Some thickening of the medial aspect of the left major fissure is noted. Right lung is clear. Small calcified granuloma incidentally noted in the right upper lobe. Trace left pleural effusion lying dependently. Diffuse bronchial wall thickening with mild paraseptal emphysema, most evident the lung apices. Upper Abdomen: Unremarkable. Musculoskeletal: There are no aggressive appearing lytic or blastic lesions noted in the visualized portions of the skeleton. IMPRESSION: 1. Evolving post treatment related changes from recent chemotherapy and radiation therapy, as above. There is persistence of mass-like soft tissue in the left hilar region, and some prominent nodal tissue remains in the low left paratracheal nodal station; how  much of this reflects developing postradiation fibrosis bursae residual disease is uncertain at this time, and continued attention on follow-up imaging is recommended. 2. Mild diffuse bronchial wall thickening with mild paraseptal emphysema; imaging findings suggestive of underlying COPD. Electronically Signed   By: Vinnie Langton M.D.   On: 07/18/2016 10:02    Impression/Plan: 1. Stage IIIA, T2a, N2, M0 NSCLC, adenocarcinoma of the left upper lobe. It is difficult to determine her response from her recent imaging isn't available early the her response, however she will follow-up with Dr. Julien Nordmann for additional discussion regarding the findings. We will be happy to  see her as often as needed moving forward, and anticipate returning her care back to Dr. Julien Nordmann long-term. She states agreement and understanding. 2. 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 The patient has been counseled on her CT scan, it is unclear specifically this is radiation pneumonitis versus pneumonia, however we will treat her inferior leads for pneumonia well treating what we presumed to be radiation pneumonitis. We discussed the utility of antibiotic therapy would include doxycycline that she has penicillin and fluoroscopy quinolone allergies. A prescription is called her pharmacy. Prescription was also calling for [facility] for her cough, and the prednisone taper beginning at 60 mg tapering down 10 mg every 5 days. This was documented in written out on a calendar for her as well. We discussed prophylaxis of her stomach with PPI continuation, which she intends to do. I will follow-up with her by phone in 2 weeks , and she will notify us of any questions or concerns that arise prior to that discussion.    Carola Rhine, PAC

## 2016-07-21 ENCOUNTER — Encounter: Payer: Self-pay | Admitting: Internal Medicine

## 2016-07-21 ENCOUNTER — Ambulatory Visit (HOSPITAL_BASED_OUTPATIENT_CLINIC_OR_DEPARTMENT_OTHER): Payer: No Typology Code available for payment source | Admitting: Internal Medicine

## 2016-07-21 VITALS — BP 104/72 | HR 80 | Temp 98.5°F | Resp 18 | Ht 68.0 in | Wt 187.9 lb

## 2016-07-21 DIAGNOSIS — J700 Acute pulmonary manifestations due to radiation: Secondary | ICD-10-CM

## 2016-07-21 DIAGNOSIS — R509 Fever, unspecified: Secondary | ICD-10-CM

## 2016-07-21 DIAGNOSIS — C3412 Malignant neoplasm of upper lobe, left bronchus or lung: Secondary | ICD-10-CM

## 2016-07-21 DIAGNOSIS — C3492 Malignant neoplasm of unspecified part of left bronchus or lung: Secondary | ICD-10-CM

## 2016-07-21 DIAGNOSIS — Z5112 Encounter for antineoplastic immunotherapy: Secondary | ICD-10-CM

## 2016-07-21 HISTORY — DX: Encounter for antineoplastic immunotherapy: Z51.12

## 2016-07-21 NOTE — Progress Notes (Signed)
[place] [phone]   [phone]) [phone]  OFFICE PROGRESS NOTE  Gennette Pac, MD [place] [address]  DIAGNOSIS: Stage IIIA (T2a, N2, M0) non-small cell lung cancer, adenocarcinoma presented with left suprahilar mass, mediastinal lymphadenopathy and suspicious pulmonary nodule in the left upper lobe diagnosed in November 2017. No actionable mutations on [employer] 360 testing.  PRIOR THERAPY: Status post concurrent chemoradiation with weekly carboplatin for AUC of 2 and paclitaxel 45 MG/M2 for 7 cycles. Last dose was given 06/13/2016.  CURRENT THERAPY: Immunotherapy with Imfinzi (Durvalumab) 10 MG/KG every 2 weeks. First dose 08/11/2016.  INTERVAL HISTORY: Debbie Baker 51 y.o. female clinic today for follow-up visit accompanied by her husband. The patient completed a course of concurrent chemoradiation with weekly carboplatin and paclitaxel. She tolerated her treatment well except for increasing cough likely secondary to radiation induced pneumonitis. She also has mild fever recently. She was seen at the radiation oncology Department few days ago and started on antibiotics as well as a tapering dose of prednisone. She is currently on 60 mg by mouth daily. She denied having any recent weight loss or night sweats. She has no nausea, vomiting, diarrhea or constipation. She had repeat CT scan of the chest performed recently and she is here for evaluation and discussion of her scan results and recommendation regarding her condition.  MEDICAL HISTORY: Past Medical History:  Diagnosis Date  . Allergic rhinitis   . Deaf, left   . Diverticulosis   . Dyspnea   . Encounter for antineoplastic chemotherapy 04/18/2016  . GERD (gastroesophageal reflux disease)   . Hyperlipidemia   . Lung cancer ([place])    non small cell lung cancer favoring adenocarcinoma   . Lung nodules   . Migraine   . Odynophagia 05/24/2016  . Pneumonia   . PONV  (postoperative nausea and vomiting)   . Wears glasses     ALLERGIES:  is allergic to penicillins; chantix [varenicline]; and levaquin [levofloxacin].  MEDICATIONS:  Current Outpatient Prescriptions  Medication Sig Dispense Refill  . AFLURIA PRESERVATIVE FREE 0.5 ML SUSY     . benzonatate (TESSALON) 100 MG capsule Take 1 capsule (100 mg total) by mouth every 4 (four) hours as needed for cough. 60 capsule 1  . Calcium-Magnesium-Vitamin D 185-50-100 MG-MG-UNIT CAPS Take 1 tablet by mouth every evening.    . cetirizine (ZYRTEC) 10 MG tablet Take 10 mg by mouth daily.    . diphenhydrAMINE (BENADRYL) 25 mg capsule Take 25 mg by mouth at bedtime as needed.    . doxycycline (VIBRA-TABS) 100 MG tablet Take 1 tablet (100 mg total) by mouth 2 (two) times daily. 14 tablet 0  . ELDERBERRY PO Take 2 tablets by mouth daily. Chewable Supplement    . HYDROcodone-acetaminophen (HYCET) 7.5-325 mg/15 ml solution Take 15 mLs by mouth 4 (four) times daily as needed for moderate pain. 473 mL 0  . Misc Natural Products (BLACK COHOSH MENOPAUSE COMPLEX) TABS Take 1 capsule by mouth 2 (two) times daily.    . Misc Natural Products (TURMERIC CURCUMIN) CAPS Take 1 capsule by mouth every evening. 1500 mg capsule    . Multiple Vitamins-Minerals (WOMENS 50+ MULTI VITAMIN/MIN) TABS Take 1 tablet by mouth daily.    . naproxen sodium (ANAPROX) 220 MG tablet Take 220-440 mg by mouth 2 (two) times daily as needed (for pain).     . Omega-3 Fatty Acids (FISH OIL) 1000 MG CAPS Take 2,000 mg by mouth 2 (  two) times daily.    Marland Kitchen OVER THE COUNTER MEDICATION Take 250 mg by mouth daily. Jiaogulan Supplement    . pantoprazole (PROTONIX) 40 MG tablet Take 1 tablet (40 mg total) by mouth every evening.    . pravastatin (PRAVACHOL) 20 MG tablet Take 20 mg by mouth every evening.  3  . predniSONE (DELTASONE) 10 MG tablet Take 6 tabs po daily x 5 days, 5 tabs po x 5 days, 4 tabs po x 5 days, 3 tabs po x 5 days, 2 tabs po x 5 days, 1 tab po x5  days, then dc 105 tablet 0  . prochlorperazine (COMPAZINE) 10 MG tablet Take 1 tablet (10 mg total) by mouth every 6 (six) hours as needed for nausea or vomiting. 30 tablet 0  . ranitidine (ZANTAC) 150 MG tablet Take 150-300 mg by mouth every evening. 300 mg with more acidic meals    . sucralfate (CARAFATE) 1 g tablet Take 1 tablet (1 g total) by mouth 4 (four) times daily -  with meals and at bedtime. 5 min before meals for radiation induced esophagitis 120 tablet 2   No current facility-administered medications for this visit.     SURGICAL HISTORY:  Past Surgical History:  Procedure Laterality Date  . ABDOMINAL HYSTERECTOMY    . VIDEO BRONCHOSCOPY Bilateral 07/03/2015   Procedure: VIDEO BRONCHOSCOPY WITH FLUORO;  Surgeon: Marshell Garfinkel, MD;  Location: [facility];  Service: Cardiopulmonary;  Laterality: Bilateral;  . VIDEO BRONCHOSCOPY WITH ENDOBRONCHIAL ULTRASOUND N/A 04/06/2016   Procedure: VIDEO BRONCHOSCOPY WITH ENDOBRONCHIAL ULTRASOUND;  Surgeon: Collene Gobble, MD;  Location: [facility];  Service: Thoracic;  Laterality: N/A;  . WRIST FRACTURE SURGERY      REVIEW OF SYSTEMS:  Constitutional: negative Eyes: negative Ears, nose, mouth, throat, and face: negative Respiratory: positive for cough Cardiovascular: negative Gastrointestinal: negative Genitourinary:negative Integument/breast: negative Hematologic/lymphatic: negative Musculoskeletal:negative Neurological: negative Behavioral/Psych: negative Endocrine: negative Allergic/Immunologic: negative   PHYSICAL EXAMINATION: General appearance: alert, cooperative and no distress Head: Normocephalic, without obvious abnormality, atraumatic Neck: no adenopathy, no JVD, supple, symmetrical, trachea midline and thyroid not enlarged, symmetric, no tenderness/mass/nodules Lymph nodes: Cervical, supraclavicular, and axillary nodes normal. Resp: clear to auscultation bilaterally Back: symmetric, no curvature. ROM normal. No CVA  tenderness. Cardio: regular rate and rhythm, S1, S2 normal, no murmur, click, rub or gallop GI: soft, non-tender; bowel sounds normal; no masses,  no organomegaly Extremities: extremities normal, atraumatic, no cyanosis or edema Neurologic: Alert and oriented X 3, normal strength and tone. Normal symmetric reflexes. Normal coordination and gait  ECOG PERFORMANCE STATUS: 1 - Symptomatic but completely ambulatory  Blood pressure 104/72, pulse 80, temperature 98.5 F (36.9 C), temperature source Oral, resp. rate 18, height '5\' 8"'$  (1.727 m), weight 187 lb 14.4 oz (85.2 kg), SpO2 97 %.  LABORATORY DATA: Lab Results  Component Value Date   WBC 5.7 07/18/2016   HGB 13.2 07/18/2016   HCT 38.1 07/18/2016   MCV 96.6 07/18/2016   PLT 260 07/18/2016      Chemistry      Component Value Date/Time   NA 140 07/18/2016 0810   K 4.1 07/18/2016 0810   CL 107 04/06/2016 0742   CO2 25 07/18/2016 0810   BUN 8.4 07/18/2016 0810   CREATININE 0.8 07/18/2016 0810      Component Value Date/Time   CALCIUM 9.9 07/18/2016 0810   ALKPHOS 97 07/18/2016 0810   AST 28 07/18/2016 0810   ALT 44 07/18/2016 0810   BILITOT 0.59 07/18/2016 0810  RADIOGRAPHIC STUDIES: Ct Chest W Contrast  Result Date: 07/18/2016 CLINICAL DATA:  51 year old female with history of stage III left-sided lung adenocarcinoma status post radiation therapy (completed 4 weeks ago) and chemotherapy (completed 5 weeks ago). Followup study. EXAM: CT CHEST WITH CONTRAST TECHNIQUE: Multidetector CT imaging of the chest was performed during intravenous contrast administration. CONTRAST:  81m ISOVUE-300 IOPAMIDOL (ISOVUE-300) INJECTION 61% COMPARISON:  Multiple priors, most recently PET-CT 04/04/2016 and chest CT 03/21/2016). FINDINGS: Cardiovascular: Heart size is normal. There is no significant pericardial fluid, thickening or pericardial calcification. No atherosclerotic calcifications are identified within the thoracic aorta or the  coronary arteries. Mediastinum/Nodes: There is some amorphous soft tissue in the left hilar region and low left paratracheal nodal stations (which directly correspond to the areas of hypermetabolic lymphadenopathy noted on prior PET-CT 04/04/2016), which likely reflects residual partially treated lymphadenopathy. This is quite thick in regions measuring up to 2 cm in thickness in the anterior aspect of the left hilum (image 57 of series 2) and 1.6 cm in thickness in the low left paratracheal nodal station. No other isolated mediastinal or right hilar lymphadenopathy is noted. Esophagus is unremarkable in appearance. No axillary lymphadenopathy. Lungs/Pleura: Evolving post treatment related changes are noted throughout the left lung centered around the treated left upper lobe mass. These changes include mass-like areas of apparent airspace consolidation with surrounding ground-glass attenuation and increasing areas of septal thickening throughout the left lung, most evident in the left upper lobe. Some thickening of the medial aspect of the left major fissure is noted. Right lung is clear. Small calcified granuloma incidentally noted in the right upper lobe. Trace left pleural effusion lying dependently. Diffuse bronchial wall thickening with mild paraseptal emphysema, most evident the lung apices. Upper Abdomen: Unremarkable. Musculoskeletal: There are no aggressive appearing lytic or blastic lesions noted in the visualized portions of the skeleton. IMPRESSION: 1. Evolving post treatment related changes from recent chemotherapy and radiation therapy, as above. There is persistence of mass-like soft tissue in the left hilar region, and some prominent nodal tissue remains in the low left paratracheal nodal station; how much of this reflects developing postradiation fibrosis bursae residual disease is uncertain at this time, and continued attention on follow-up imaging is recommended. 2. Mild diffuse bronchial wall  thickening with mild paraseptal emphysema; imaging findings suggestive of underlying COPD. Electronically Signed   By: DVinnie LangtonM.D.   On: 07/18/2016 10:02    ASSESSMENT AND PLAN:  This is a very pleasant 51years old white female with stage IIIa non-small cell lung cancer, adenocarcinoma. The patient underwent a course of concurrent chemoradiation with weekly carboplatin and paclitaxel status post 7 weeks of treatment. She tolerated her treatment well except for the radiation-induced pneumonitis. She had repeat CT scan of the chest performed recently. I personally and independently reviewed the scan images and discuss the results and showed the images to the patient and her husband. Her scan showed improvement in her disease but there was evolving postradiation changes. I discussed with the patient several options for management of her condition including continuous observation and monitoring versus consideration of consolidation treatment with Imfinzi (Durvalumab) 10 MG/KG every 2 weeks for a total of 1 year unless there is a disease progression or unacceptable toxicity. I discussed with the patient the adverse effect of this treatment including but not limited to immune mediated skin rash, pneumonitis, liver, renal, thyroid or other endocrine dysfunction. The patient is interested in this treatment but we will start the first dose  in 3 weeks after she taper her prednisone dose to 10 mg or less. For the radiation-induced pneumonitis, she will continue on the taper dose of prednisone but she will decrease the dose every 4 days. For pain management, the patient will continue on Hycet. She was advised to call immediately if she has any concerning symptoms in the interval. The patient voices understanding of current disease status and treatment options and is in agreement with the current care plan. All questions were answered. The patient knows to call the clinic with any problems, questions  or concerns. We can certainly see the patient much sooner if necessary.  Disclaimer: This note was dictated with voice recognition software. Similar sounding words can inadvertently be transcribed and may not be corrected upon review.

## 2016-07-21 NOTE — Progress Notes (Signed)
DISCONTINUE OFF PATHWAY REGIMEN - Non-Small Cell Lung  [id number]: Carboplatin AUC=2 + Paclitaxel 45 mg/m2 Weekly During Radiation    Administer weekly:     Paclitaxel (Taxol(R)) 45 mg/m2 in 250 mL NS IV over 1 hour followed by       Dose Mod: None     Carboplatin (Paraplatin(R)) AUC=2 in 100 mL NS IV over 30 minutes       Dose Mod: None         Additional Orders: * All AUC calculations intended to be used in [employer] formula  **Always confirm dose/schedule in your pharmacy ordering system**    REASON: Continuation Of Treatment PRIOR TREATMENT: [id number]: Carboplatin AUC=2 + Paclitaxel 45 mg/m2 Weekly During Radiation TREATMENT RESPONSE: Partial Response (PR)  START OFF PATHWAY REGIMEN - Non-Small Cell Lung  Off Pathway: Durvalumab 10 mg/kg q14 Days  [id number]:Durvalumab 10 mg/kg q14 Days:   A cycle is every 14 days:     Durvalumab (Imfinzi(TM)) 10 mg/kg in 250 mL NS IV over 60 minutes on day 1 only of each cycle.       Dose Mod: None         Additional Orders: Monitor for infusion reactions; interrupt or slow infusion for grade 1 or 2 reaction occurs and consider premedication for subsequent infusion; discontinue for grade 3 or 4 infusion reaction.   See prescribing information for  dose interruption guidelines and management of toxicity.  Ref: [place] [phone], 2016.  **Always confirm dose/schedule in your pharmacy ordering system**    Patient Characteristics: Stage III - Unresectable, PS = 0, 1 AJCC T Category: T2b Current Disease Status: No Distant Mets or Local Recurrence AJCC N Category: N2 AJCC M Category: M0 AJCC 8 Stage Grouping: IIIA Performance Status: PS = 0, 1  Intent of Therapy: Curative Intent, Discussed with Patient

## 2016-07-22 ENCOUNTER — Encounter: Payer: Self-pay | Admitting: Internal Medicine

## 2016-07-22 LAB — GUARDANT 360

## 2016-08-02 ENCOUNTER — Ambulatory Visit: Payer: Self-pay | Admitting: Radiation Oncology

## 2016-08-04 ENCOUNTER — Telehealth: Payer: Self-pay | Admitting: Gastroenterology

## 2016-08-04 NOTE — Telephone Encounter (Signed)
We can refill it and if you can book her a follow up as well that would be great. thanks

## 2016-08-04 NOTE — Telephone Encounter (Signed)
Dr. Havery Moros  Are you ok with refilling Protonix. Pt was supposed to f/u with you in 3mafter last visit that was in Aug of 2017. Do you want me to schedule an appt first?

## 2016-08-05 ENCOUNTER — Other Ambulatory Visit: Payer: Self-pay

## 2016-08-05 ENCOUNTER — Telehealth: Payer: Self-pay | Admitting: Radiation Oncology

## 2016-08-05 MED ORDER — PANTOPRAZOLE SODIUM 40 MG PO TBEC: 40.0000 mg | DELAYED_RELEASE_TABLET | Freq: Every evening | ORAL | 3 refills | Status: DC

## 2016-08-05 NOTE — Telephone Encounter (Signed)
390 with 3 refills of Protonix sent to [facility] in [place]. A recall was placed for pt to schedule an appt.

## 2016-08-05 NOTE — Telephone Encounter (Signed)
Phoned patient to inquire about status. Patient confirms that overall she feels better. Reports her cough is still productive with white sputum instead of yellow. Reports her cough is less intense and less frequent. Denies shortness of breath but that she "really hasn't exerted herself much." Reports mild intermittent substernal pressure. Reports Dr. Julien Nordmann speed up her prednisone taper so that she could begin immunotherapy on 08/11/16. Denies any additional needs at this time. Understands to contact this RN with future needs.

## 2016-08-11 ENCOUNTER — Ambulatory Visit (HOSPITAL_BASED_OUTPATIENT_CLINIC_OR_DEPARTMENT_OTHER): Payer: Managed Care, Other (non HMO)

## 2016-08-11 ENCOUNTER — Encounter: Payer: Self-pay | Admitting: Gastroenterology

## 2016-08-11 ENCOUNTER — Other Ambulatory Visit (HOSPITAL_BASED_OUTPATIENT_CLINIC_OR_DEPARTMENT_OTHER): Payer: Managed Care, Other (non HMO)

## 2016-08-11 ENCOUNTER — Telehealth: Payer: Self-pay | Admitting: Internal Medicine

## 2016-08-11 ENCOUNTER — Ambulatory Visit (HOSPITAL_BASED_OUTPATIENT_CLINIC_OR_DEPARTMENT_OTHER): Payer: Managed Care, Other (non HMO) | Admitting: Internal Medicine

## 2016-08-11 ENCOUNTER — Encounter: Payer: Self-pay | Admitting: Internal Medicine

## 2016-08-11 VITALS — BP 106/72 | HR 93 | Temp 98.5°F | Resp 18 | Ht 68.0 in | Wt 194.9 lb

## 2016-08-11 DIAGNOSIS — C3492 Malignant neoplasm of unspecified part of left bronchus or lung: Secondary | ICD-10-CM

## 2016-08-11 DIAGNOSIS — Z5112 Encounter for antineoplastic immunotherapy: Secondary | ICD-10-CM

## 2016-08-11 DIAGNOSIS — C3412 Malignant neoplasm of upper lobe, left bronchus or lung: Secondary | ICD-10-CM

## 2016-08-11 DIAGNOSIS — R0602 Shortness of breath: Secondary | ICD-10-CM | POA: Diagnosis not present

## 2016-08-11 DIAGNOSIS — Z79899 Other long term (current) drug therapy: Secondary | ICD-10-CM

## 2016-08-11 LAB — CBC WITH DIFFERENTIAL/PLATELET
BASO%: 1 % (ref 0.0–2.0)
BASOS ABS: 0.1 10*3/uL (ref 0.0–0.1)
EOS%: 1.9 % (ref 0.0–7.0)
Eosinophils Absolute: 0.1 10*3/uL (ref 0.0–0.5)
HCT: 41.4 % (ref 34.8–46.6)
HGB: 13.9 g/dL (ref 11.6–15.9)
LYMPH#: 1.2 10*3/uL (ref 0.9–3.3)
LYMPH%: 21.4 % (ref 14.0–49.7)
MCH: 33.9 pg (ref 25.1–34.0)
MCHC: 33.7 g/dL (ref 31.5–36.0)
MCV: 100.6 fL (ref 79.5–101.0)
MONO#: 0.4 10*3/uL (ref 0.1–0.9)
MONO%: 7.9 % (ref 0.0–14.0)
NEUT#: 3.7 10*3/uL (ref 1.5–6.5)
NEUT%: 67.8 % (ref 38.4–76.8)
Platelets: 205 10*3/uL (ref 145–400)
RBC: 4.11 10*6/uL (ref 3.70–5.45)
RDW: 14 % (ref 11.2–14.5)
WBC: 5.5 10*3/uL (ref 3.9–10.3)

## 2016-08-11 LAB — COMPREHENSIVE METABOLIC PANEL
ALT: 40 U/L (ref 0–55)
AST: 18 U/L (ref 5–34)
Albumin: 3.9 g/dL (ref 3.5–5.0)
Alkaline Phosphatase: 79 U/L (ref 40–150)
Anion Gap: 10 mEq/L (ref 3–11)
BUN: 13.5 mg/dL (ref 7.0–26.0)
CALCIUM: 9.7 mg/dL (ref 8.4–10.4)
CHLORIDE: 104 meq/L (ref 98–109)
CO2: 28 meq/L (ref 22–29)
CREATININE: 0.8 mg/dL (ref 0.6–1.1)
EGFR: 85 mL/min/{1.73_m2} — ABNORMAL LOW (ref >90)
GLUCOSE: 75 mg/dL (ref 70–140)
POTASSIUM: 3.6 meq/L (ref 3.5–5.1)
SODIUM: 142 meq/L (ref 136–145)
Total Bilirubin: 0.75 mg/dL (ref 0.20–1.20)
Total Protein: 7.1 g/dL (ref 6.4–8.3)

## 2016-08-11 LAB — TSH: TSH: 2.443 m[IU]/L (ref 0.308–3.960)

## 2016-08-11 MED ORDER — DURVALUMAB 500 MG/10ML IV SOLN
10.0000 mg/kg | Freq: Once | INTRAVENOUS | Status: AC
  Administered 2016-08-11: 860 mg via INTRAVENOUS
  Filled 2016-08-11: qty 7.2

## 2016-08-11 MED ORDER — SODIUM CHLORIDE 0.9 % IV SOLN
900.0000 mg | Freq: Once | INTRAVENOUS | Status: DC
  Filled 2016-08-11: qty 18

## 2016-08-11 MED ORDER — SODIUM CHLORIDE 0.9 % IV SOLN
Freq: Once | INTRAVENOUS | Status: AC
  Administered 2016-08-11: 13:00:00 via INTRAVENOUS

## 2016-08-11 MED ORDER — DURVALUMAB 500 MG/10ML IV SOLN: 860.0000 mg | Freq: Once | INTRAVENOUS | Status: DC

## 2016-08-11 NOTE — Patient Instructions (Signed)
[facility] Discharge Instructions for Patients Receiving Chemotherapy  Today you received the following chemotherapy agents Imfinzi  To help prevent nausea and vomiting after your treatment, we encourage you to take your nausea medication as directed. No Zofran for 3 days. Take Compazine instead.    If you develop nausea and vomiting that is not controlled by your nausea medication, call the clinic.   BELOW ARE SYMPTOMS THAT SHOULD BE REPORTED IMMEDIATELY:  *FEVER GREATER THAN 100.5 F  *CHILLS WITH OR WITHOUT FEVER  NAUSEA AND VOMITING THAT IS NOT CONTROLLED WITH YOUR NAUSEA MEDICATION  *UNUSUAL SHORTNESS OF BREATH  *UNUSUAL BRUISING OR BLEEDING  TENDERNESS IN MOUTH AND THROAT WITH OR WITHOUT PRESENCE OF ULCERS  *URINARY PROBLEMS  *BOWEL PROBLEMS  UNUSUAL RASH Items with * indicate a potential emergency and should be followed up as soon as possible.  Feel free to call the clinic you have any questions or concerns. The clinic phone number is [phone].  Please show the [facility] at check-in to the Emergency Department and triage nurse.   Durvalumab injection What is this medicine? DURVALUMAB (dur VAL ue mab) is a monoclonal antibody. It is used to treat urothelial cancer. This medicine may be used for other purposes; ask your health care provider or pharmacist if you have questions. COMMON BRAND NAME(S): IMFINZI What should I tell my health care provider before I take this medicine? They need to know if you have any of these conditions: -diabetes -immune system problems -infection -inflammatory bowel disease -kidney disease -liver disease -lung or breathing disease -lupus -organ transplant -stomach or intestine problems -thyroid disease -an unusual or allergic reaction to durvalumab, other medicines, foods, dyes, or preservatives -pregnant or trying to get pregnant -breast-feeding How should I use this medicine? This medicine is for  infusion into a vein. It is given by a health care professional in a hospital or clinic setting. A special MedGuide will be given to you before each treatment. Be sure to read this information carefully each time. Talk to your pediatrician regarding the use of this medicine in children. Special care may be needed. Overdosage: If you think you have taken too much of this medicine contact a poison control center or emergency room at once. NOTE: This medicine is only for you. Do not share this medicine with others. What if I miss a dose? It is important not to miss your dose. Call your doctor or health care professional if you are unable to keep an appointment. What may interact with this medicine? Interactions have not been studied. This list may not describe all possible interactions. Give your health care provider a list of all the medicines, herbs, non-prescription drugs, or dietary supplements you use. Also tell them if you smoke, drink alcohol, or use illegal drugs. Some items may interact with your medicine. What should I watch for while using this medicine? This drug may make you feel generally unwell. Continue your course of treatment even though you feel ill unless your doctor tells you to stop. You may need blood work done while you are taking this medicine. Do not become pregnant while taking this medicine or for 3 months after stopping it. Women should inform their doctor if they wish to become pregnant or think they might be pregnant. There is a potential for serious side effects to an unborn child. Talk to your health care professional or pharmacist for more information. Do not breast-feed an infant while taking this medicine or for  3 months after stopping it. What side effects may I notice from receiving this medicine? Side effects that you should report to your doctor or health care professional as soon as possible: -allergic reactions like skin rash, itching or hives, swelling of the  face, lips, or tongue -black, tarry stools -bloody or watery diarrhea -breathing problems -change in emotions or moods -change in sex drive -changes in vision -chest pain or chest tightness -chills -confusion -cough -facial flushing -fever -headache -signs and symptoms of high blood sugar such as dizziness; dry mouth; dry skin; fruity breath; nausea; stomach pain; increased hunger or thirst; increased urination -signs and symptoms of liver injury like dark yellow or brown urine; general ill feeling or flu-like symptoms; light-colored stools; loss of appetite; nausea; right upper belly pain; unusually weak or tired; yellowing of the eyes or skin -stomach pain -trouble passing urine or change in the amount of urine -weight gain or weight loss Side effects that usually do not require medical attention (report these to your doctor or health care professional if they continue or are bothersome): -bone pain -constipation -loss of appetite -muscle pain -nausea -swelling of the ankles, feet, hands -tiredness This list may not describe all possible side effects. Call your doctor for medical advice about side effects. You may report side effects to FDA at 1-800-FDA-1088. Where should I keep my medicine? This drug is given in a hospital or clinic and will not be stored at home. NOTE: This sheet is a summary. It may not cover all possible information. If you have questions about this medicine, talk to your doctor, pharmacist, or health care provider.  2018 Elsevier/Gold Standard (2015-12-18 15:50:36)

## 2016-08-11 NOTE — Telephone Encounter (Signed)
No additional appts needed per 08/11/2016 los. Patient is aware they are able to schedule the next cycle of treatment in two weeks. Patient did not need copy of AVS or calender

## 2016-08-11 NOTE — Progress Notes (Signed)
[facility] [phone]   [phone]  OFFICE PROGRESS NOTE  Gennette Pac, MD [place] [address]  DIAGNOSIS: Stage IIIA (T2a, N2, M0) non-small cell lung cancer, adenocarcinoma presented with left suprahilar mass, mediastinal lymphadenopathy and suspicious pulmonary nodule in the left upper lobe diagnosed in November 2017. No actionable mutations on Guardant 360 testing.  PRIOR THERAPY: Status post concurrent chemoradiation with weekly carboplatin for AUC of 2 and paclitaxel 45 MG/M2 for 7 cycles. Last dose was given 06/13/2016.  CURRENT THERAPY: Immunotherapy with Imfinzi (Durvalumab) 10 MG/KG every 2 weeks. First dose 08/11/2016.  INTERVAL HISTORY: Debbie Baker 51 y.o. female came to the clinic today for follow-up visit. The patient is feeling much better today. She has less cough and shortness of breath than before. She is back to work full-time. She denied having any chest pain or hemoptysis. She has no nausea, vomiting, diarrhea or constipation. She gained around 6 pounds since her last visit because of the steroid treatment. She denied having any fever or chills. She took the last dose of prednisone yesterday. She is here today for evaluation before starting the first dose of her treatment with Imfinzi (Durvalumab).  MEDICAL HISTORY: Past Medical History:  Diagnosis Date  . Allergic rhinitis   . Deaf, left   . Diverticulosis   . Dyspnea   . Encounter for antineoplastic chemotherapy 04/18/2016  . Encounter for antineoplastic immunotherapy 07/21/2016  . GERD (gastroesophageal reflux disease)   . Hyperlipidemia   . Lung cancer ([facility])    non small cell lung cancer favoring adenocarcinoma   . Lung nodules   . Migraine   . Odynophagia 05/24/2016  . Pneumonia   . PONV (postoperative nausea and vomiting)   . Wears glasses     ALLERGIES:  is allergic to penicillins; chantix [varenicline]; and levaquin  [levofloxacin].  MEDICATIONS:  Current Outpatient Prescriptions  Medication Sig Dispense Refill  . AFLURIA PRESERVATIVE FREE 0.5 ML SUSY     . benzonatate (TESSALON) 100 MG capsule Take 1 capsule (100 mg total) by mouth every 4 (four) hours as needed for cough. 60 capsule 1  . Calcium-Magnesium-Vitamin D 185-50-100 MG-MG-UNIT CAPS Take 1 tablet by mouth every evening.    . cetirizine (ZYRTEC) 10 MG tablet Take 10 mg by mouth daily.    . diphenhydrAMINE (BENADRYL) 25 mg capsule Take 25 mg by mouth at bedtime as needed.    Marland Kitchen ELDERBERRY PO Take 2 tablets by mouth daily. Chewable Supplement    . HYDROcodone-acetaminophen (HYCET) 7.5-325 mg/15 ml solution Take 15 mLs by mouth 4 (four) times daily as needed for moderate pain. 473 mL 0  . Misc Natural Products (BLACK COHOSH MENOPAUSE COMPLEX) TABS Take 1 capsule by mouth 2 (two) times daily.    . Misc Natural Products (TURMERIC CURCUMIN) CAPS Take 1 capsule by mouth every evening. 1500 mg capsule    . Multiple Vitamins-Minerals (WOMENS 50+ MULTI VITAMIN/MIN) TABS Take 1 tablet by mouth daily.    . naproxen sodium (ANAPROX) 220 MG tablet Take 220-440 mg by mouth 2 (two) times daily as needed (for pain).     . Omega-3 Fatty Acids (FISH OIL) 1000 MG CAPS Take 2,000 mg by mouth 2 (two) times daily.    Marland Kitchen OVER THE COUNTER MEDICATION Take 250 mg by mouth daily. Jiaogulan Supplement    . pantoprazole (PROTONIX) 40 MG tablet Take 1 tablet (40 mg total) by mouth every evening. 90 tablet 3  .  pravastatin (PRAVACHOL) 20 MG tablet Take 20 mg by mouth every evening.  3  . ranitidine (ZANTAC) 150 MG tablet Take 150-300 mg by mouth every evening. 300 mg with more acidic meals    . prochlorperazine (COMPAZINE) 10 MG tablet Take 1 tablet (10 mg total) by mouth every 6 (six) hours as needed for nausea or vomiting. (Patient not taking: Reported on 08/11/2016) 30 tablet 0  . sucralfate (CARAFATE) 1 g tablet Take 1 tablet (1 g total) by mouth 4 (four) times daily -  with  meals and at bedtime. 5 min before meals for radiation induced esophagitis (Patient not taking: Reported on 08/11/2016) 120 tablet 2   No current facility-administered medications for this visit.     SURGICAL HISTORY:  Past Surgical History:  Procedure Laterality Date  . ABDOMINAL HYSTERECTOMY    . VIDEO BRONCHOSCOPY Bilateral 07/03/2015   Procedure: VIDEO BRONCHOSCOPY WITH FLUORO;  Surgeon: Marshell Garfinkel, MD;  Location: [facility];  Service: Cardiopulmonary;  Laterality: Bilateral;  . VIDEO BRONCHOSCOPY WITH ENDOBRONCHIAL ULTRASOUND N/A 04/06/2016   Procedure: VIDEO BRONCHOSCOPY WITH ENDOBRONCHIAL ULTRASOUND;  Surgeon: Collene Gobble, MD;  Location: [facility];  Service: Thoracic;  Laterality: N/A;  . WRIST FRACTURE SURGERY      REVIEW OF SYSTEMS:  A comprehensive review of systems was negative except for: Respiratory: positive for cough and dyspnea on exertion   PHYSICAL EXAMINATION: General appearance: alert, cooperative and no distress Head: Normocephalic, without obvious abnormality, atraumatic Neck: no adenopathy, no JVD, supple, symmetrical, trachea midline and thyroid not enlarged, symmetric, no tenderness/mass/nodules Lymph nodes: Cervical, supraclavicular, and axillary nodes normal. Resp: clear to auscultation bilaterally Back: symmetric, no curvature. ROM normal. No CVA tenderness. Cardio: regular rate and rhythm, S1, S2 normal, no murmur, click, rub or gallop GI: soft, non-tender; bowel sounds normal; no masses,  no organomegaly Extremities: extremities normal, atraumatic, no cyanosis or edema  ECOG PERFORMANCE STATUS: 1 - Symptomatic but completely ambulatory  Blood pressure 106/72, pulse 93, temperature 98.5 F (36.9 C), temperature source Oral, resp. rate 18, height '5\' 8"'$  (1.727 m), weight 194 lb 14.4 oz (88.4 kg), SpO2 99 %.  LABORATORY DATA: Lab Results  Component Value Date   WBC 5.5 08/11/2016   HGB 13.9 08/11/2016   HCT 41.4 08/11/2016   MCV 100.6 08/11/2016    PLT 205 08/11/2016      Chemistry      Component Value Date/Time   NA 140 07/18/2016 0810   K 4.1 07/18/2016 0810   CL 107 04/06/2016 0742   CO2 25 07/18/2016 0810   BUN 8.4 07/18/2016 0810   CREATININE 0.8 07/18/2016 0810      Component Value Date/Time   CALCIUM 9.9 07/18/2016 0810   ALKPHOS 97 07/18/2016 0810   AST 28 07/18/2016 0810   ALT 44 07/18/2016 0810   BILITOT 0.59 07/18/2016 0810       RADIOGRAPHIC STUDIES: Ct Chest W Contrast  Result Date: 07/18/2016 CLINICAL DATA:  51 year old female with history of stage III left-sided lung adenocarcinoma status post radiation therapy (completed 4 weeks ago) and chemotherapy (completed 5 weeks ago). Followup study. EXAM: CT CHEST WITH CONTRAST TECHNIQUE: Multidetector CT imaging of the chest was performed during intravenous contrast administration. CONTRAST:  75m ISOVUE-300 IOPAMIDOL (ISOVUE-300) INJECTION 61% COMPARISON:  Multiple priors, most recently PET-CT 04/04/2016 and chest CT 03/21/2016). FINDINGS: Cardiovascular: Heart size is normal. There is no significant pericardial fluid, thickening or pericardial calcification. No atherosclerotic calcifications are identified within the thoracic aorta or the coronary arteries.  Mediastinum/Nodes: There is some amorphous soft tissue in the left hilar region and low left paratracheal nodal stations (which directly correspond to the areas of hypermetabolic lymphadenopathy noted on prior PET-CT 04/04/2016), which likely reflects residual partially treated lymphadenopathy. This is quite thick in regions measuring up to 2 cm in thickness in the anterior aspect of the left hilum (image 57 of series 2) and 1.6 cm in thickness in the low left paratracheal nodal station. No other isolated mediastinal or right hilar lymphadenopathy is noted. Esophagus is unremarkable in appearance. No axillary lymphadenopathy. Lungs/Pleura: Evolving post treatment related changes are noted throughout the left lung centered  around the treated left upper lobe mass. These changes include mass-like areas of apparent airspace consolidation with surrounding ground-glass attenuation and increasing areas of septal thickening throughout the left lung, most evident in the left upper lobe. Some thickening of the medial aspect of the left major fissure is noted. Right lung is clear. Small calcified granuloma incidentally noted in the right upper lobe. Trace left pleural effusion lying dependently. Diffuse bronchial wall thickening with mild paraseptal emphysema, most evident the lung apices. Upper Abdomen: Unremarkable. Musculoskeletal: There are no aggressive appearing lytic or blastic lesions noted in the visualized portions of the skeleton. IMPRESSION: 1. Evolving post treatment related changes from recent chemotherapy and radiation therapy, as above. There is persistence of mass-like soft tissue in the left hilar region, and some prominent nodal tissue remains in the low left paratracheal nodal station; how much of this reflects developing postradiation fibrosis bursae residual disease is uncertain at this time, and continued attention on follow-up imaging is recommended. 2. Mild diffuse bronchial wall thickening with mild paraseptal emphysema; imaging findings suggestive of underlying COPD. Electronically Signed   By: Vinnie Langton M.D.   On: 07/18/2016 10:02    ASSESSMENT AND PLAN:  This very pleasant 51 years old white female with stage IIIa non-small cell lung cancer, adenocarcinoma status post concurrent chemoradiation with weekly carboplatin and paclitaxel for 7 weeks complicated with radiation induced pneumonitis and treated with a tapered dose of prednisone which was completed yesterday. The patient is here today to start the first dose of immunotherapy with Imfinzi (Durvalumab). She is feeling fine and we will proceed with cycle #1 today as scheduled. I will see her back for follow-up visit in 2 weeks for evaluation before  starting cycle #2. We discussed with the patient adverse effect of the immunotherapy again and she would like to proceed with the treatment as planned. She was advised to call immediately if she has any concerning symptoms in the interval. The patient voices understanding of current disease status and treatment options and is in agreement with the current care plan. All questions were answered. The patient knows to call the clinic with any problems, questions or concerns. We can certainly see the patient much sooner if necessary. I spent 10 minutes counseling the patient face to face. The total time spent in the appointment was 15 minutes.  Disclaimer: This note was dictated with voice recognition software. Similar sounding words can inadvertently be transcribed and may not be corrected upon review.

## 2016-08-25 ENCOUNTER — Other Ambulatory Visit (HOSPITAL_BASED_OUTPATIENT_CLINIC_OR_DEPARTMENT_OTHER): Payer: Managed Care, Other (non HMO)

## 2016-08-25 ENCOUNTER — Encounter: Payer: Self-pay | Admitting: *Deleted

## 2016-08-25 ENCOUNTER — Telehealth: Payer: Self-pay | Admitting: Internal Medicine

## 2016-08-25 ENCOUNTER — Ambulatory Visit (HOSPITAL_BASED_OUTPATIENT_CLINIC_OR_DEPARTMENT_OTHER): Payer: Managed Care, Other (non HMO) | Admitting: Internal Medicine

## 2016-08-25 ENCOUNTER — Ambulatory Visit (HOSPITAL_BASED_OUTPATIENT_CLINIC_OR_DEPARTMENT_OTHER): Payer: Managed Care, Other (non HMO)

## 2016-08-25 VITALS — BP 109/95 | HR 96 | Temp 98.3°F | Resp 18 | Wt 191.3 lb

## 2016-08-25 DIAGNOSIS — C3412 Malignant neoplasm of upper lobe, left bronchus or lung: Secondary | ICD-10-CM | POA: Diagnosis not present

## 2016-08-25 DIAGNOSIS — C3492 Malignant neoplasm of unspecified part of left bronchus or lung: Secondary | ICD-10-CM

## 2016-08-25 DIAGNOSIS — Z5112 Encounter for antineoplastic immunotherapy: Secondary | ICD-10-CM | POA: Diagnosis not present

## 2016-08-25 LAB — COMPREHENSIVE METABOLIC PANEL
ALK PHOS: 91 U/L (ref 40–150)
ALT: 32 U/L (ref 0–55)
AST: 22 U/L (ref 5–34)
Albumin: 3.9 g/dL (ref 3.5–5.0)
Anion Gap: 8 mEq/L (ref 3–11)
BILIRUBIN TOTAL: 0.58 mg/dL (ref 0.20–1.20)
BUN: 7.6 mg/dL (ref 7.0–26.0)
CO2: 27 meq/L (ref 22–29)
CREATININE: 0.8 mg/dL (ref 0.6–1.1)
Calcium: 10 mg/dL (ref 8.4–10.4)
Chloride: 106 mEq/L (ref 98–109)
EGFR: 89 mL/min/{1.73_m2} — AB (ref >90)
GLUCOSE: 86 mg/dL (ref 70–140)
Potassium: 3.9 mEq/L (ref 3.5–5.1)
SODIUM: 141 meq/L (ref 136–145)
TOTAL PROTEIN: 7.5 g/dL (ref 6.4–8.3)

## 2016-08-25 LAB — CBC WITH DIFFERENTIAL/PLATELET
BASO%: 0.4 % (ref 0.0–2.0)
Basophils Absolute: 0 10*3/uL (ref 0.0–0.1)
EOS ABS: 0.1 10*3/uL (ref 0.0–0.5)
EOS%: 1.5 % (ref 0.0–7.0)
HCT: 40.4 % (ref 34.8–46.6)
HGB: 13.8 g/dL (ref 11.6–15.9)
LYMPH%: 22.6 % (ref 14.0–49.7)
MCH: 33.2 pg (ref 25.1–34.0)
MCHC: 34.2 g/dL (ref 31.5–36.0)
MCV: 97.1 fL (ref 79.5–101.0)
MONO#: 0.6 10*3/uL (ref 0.1–0.9)
MONO%: 11.5 % (ref 0.0–14.0)
NEUT%: 64 % (ref 38.4–76.8)
NEUTROS ABS: 3.4 10*3/uL (ref 1.5–6.5)
Platelets: 250 10*3/uL (ref 145–400)
RBC: 4.16 10*6/uL (ref 3.70–5.45)
RDW: 11.9 % (ref 11.2–14.5)
WBC: 5.3 10*3/uL (ref 3.9–10.3)
lymph#: 1.2 10*3/uL (ref 0.9–3.3)

## 2016-08-25 MED ORDER — SODIUM CHLORIDE 0.9 % IV SOLN: 10.5000 mg/kg | Freq: Once | INTRAVENOUS | Status: DC

## 2016-08-25 MED ORDER — SODIUM CHLORIDE 0.9 % IV SOLN
10.0000 mg/kg | Freq: Once | INTRAVENOUS | Status: AC
  Administered 2016-08-25: 860 mg via INTRAVENOUS
  Filled 2016-08-25: qty 10

## 2016-08-25 MED ORDER — SODIUM CHLORIDE 0.9 % IV SOLN
Freq: Once | INTRAVENOUS | Status: AC
  Administered 2016-08-25: 14:00:00 via INTRAVENOUS

## 2016-08-25 NOTE — Telephone Encounter (Signed)
Gave patient avs report and appointments for April and May.  °

## 2016-08-25 NOTE — Patient Instructions (Signed)
[facility] Discharge Instructions for Patients Receiving Chemotherapy  Today you received the following chemotherapy agents Durvalumab   To help prevent nausea and vomiting after your treatment, we encourage you to take your nausea medication as prescribed.  If you develop nausea and vomiting that is not controlled by your nausea medication, call the clinic.   BELOW ARE SYMPTOMS THAT SHOULD BE REPORTED IMMEDIATELY:  *FEVER GREATER THAN 100.5 F  *CHILLS WITH OR WITHOUT FEVER  NAUSEA AND VOMITING THAT IS NOT CONTROLLED WITH YOUR NAUSEA MEDICATION  *UNUSUAL SHORTNESS OF BREATH  *UNUSUAL BRUISING OR BLEEDING  TENDERNESS IN MOUTH AND THROAT WITH OR WITHOUT PRESENCE OF ULCERS  *URINARY PROBLEMS  *BOWEL PROBLEMS  UNUSUAL RASH Items with * indicate a potential emergency and should be followed up as soon as possible.  Feel free to call the clinic you have any questions or concerns. The clinic phone number is [phone].  Please show the CHEMO ALERT CARD at check-in to the Emergency Department and triage nurse.  Durvalumab injection What is this medicine? DURVALUMAB (dur VAL ue mab) is a monoclonal antibody. It is used to treat urothelial cancer. This medicine may be used for other purposes; ask your health care provider or pharmacist if you have questions. COMMON BRAND NAME(S): IMFINZI What should I tell my health care provider before I take this medicine? They need to know if you have any of these conditions: -diabetes -immune system problems -infection -inflammatory bowel disease -kidney disease -liver disease -lung or breathing disease -lupus -organ transplant -stomach or intestine problems -thyroid disease -an unusual or allergic reaction to durvalumab, other medicines, foods, dyes, or preservatives -pregnant or trying to get pregnant -breast-feeding How should I use this medicine? This medicine is for infusion into a vein. It is given by a health  care professional in a hospital or clinic setting. A special MedGuide will be given to you before each treatment. Be sure to read this information carefully each time. Talk to your pediatrician regarding the use of this medicine in children. Special care may be needed. Overdosage: If you think you have taken too much of this medicine contact a poison control center or emergency room at once. NOTE: This medicine is only for you. Do not share this medicine with others. What if I miss a dose? It is important not to miss your dose. Call your doctor or health care professional if you are unable to keep an appointment. What may interact with this medicine? Interactions have not been studied. This list may not describe all possible interactions. Give your health care provider a list of all the medicines, herbs, non-prescription drugs, or dietary supplements you use. Also tell them if you smoke, drink alcohol, or use illegal drugs. Some items may interact with your medicine. What should I watch for while using this medicine? This drug may make you feel generally unwell. Continue your course of treatment even though you feel ill unless your doctor tells you to stop. You may need blood work done while you are taking this medicine. Do not become pregnant while taking this medicine or for 3 months after stopping it. Women should inform their doctor if they wish to become pregnant or think they might be pregnant. There is a potential for serious side effects to an unborn child. Talk to your health care professional or pharmacist for more information. Do not breast-feed an infant while taking this medicine or for 3 months after stopping it. What side effects may I   notice from receiving this medicine? Side effects that you should report to your doctor or health care professional as soon as possible: -allergic reactions like skin rash, itching or hives, swelling of the face, lips, or tongue -black, tarry  stools -bloody or watery diarrhea -breathing problems -change in emotions or moods -change in sex drive -changes in vision -chest pain or chest tightness -chills -confusion -cough -facial flushing -fever -headache -signs and symptoms of high blood sugar such as dizziness; dry mouth; dry skin; fruity breath; nausea; stomach pain; increased hunger or thirst; increased urination -signs and symptoms of liver injury like dark yellow or brown urine; general ill feeling or flu-like symptoms; light-colored stools; loss of appetite; nausea; right upper belly pain; unusually weak or tired; yellowing of the eyes or skin -stomach pain -trouble passing urine or change in the amount of urine -weight gain or weight loss Side effects that usually do not require medical attention (report these to your doctor or health care professional if they continue or are bothersome): -bone pain -constipation -loss of appetite -muscle pain -nausea -swelling of the ankles, feet, hands -tiredness This list may not describe all possible side effects. Call your doctor for medical advice about side effects. You may report side effects to FDA at 1-800-FDA-1088. Where should I keep my medicine? This drug is given in a hospital or clinic and will not be stored at home. NOTE: This sheet is a summary. It may not cover all possible information. If you have questions about this medicine, talk to your doctor, pharmacist, or health care provider.  2018 Elsevier/Gold Standard (2015-12-18 15:50:36)  

## 2016-08-25 NOTE — Progress Notes (Signed)
[facility] Telephone:([phone]   Fax:(336) [phone]  OFFICE PROGRESS NOTE  Gennette Pac, MD [place] [address]  DIAGNOSIS: Stage IIIA (T2a, N2, M0) non-small cell lung cancer, adenocarcinoma presented with left suprahilar mass, mediastinal lymphadenopathy and suspicious pulmonary nodule in the left upper lobe diagnosed in November 2017. No actionable mutations on Guardant 360 testing.  PRIOR THERAPY: Status post concurrent chemoradiation with weekly carboplatin for AUC of 2 and paclitaxel 45 MG/M2 for 7 cycles. Last dose was given 06/13/2016.  CURRENT THERAPY: Immunotherapy with Imfinzi (Durvalumab) 10 MG/KG every 2 weeks. First dose 08/11/2016. Status post one cycle.  INTERVAL HISTORY: Debbie Baker 51 y.o. female came to the clinic today for follow-up visit. The patient is currently undergoing treatment with immunotherapy with Imfinzi (Durvalumab) status post 1 cycle and tolerated the first cycle of her treatment well. She denied having any chest pain, shortness breath, cough or hemoptysis. She has no skin rash or diarrhea. She denied having any fever or chills. She has no significant weight loss or night sweats. She is here today for evaluation before starting cycle #2.   MEDICAL HISTORY: Past Medical History:  Diagnosis Date  . Allergic rhinitis   . Deaf, left   . Diverticulosis   . Dyspnea   . Encounter for antineoplastic chemotherapy 04/18/2016  . Encounter for antineoplastic immunotherapy 07/21/2016  . GERD (gastroesophageal reflux disease)   . Hyperlipidemia   . Lung cancer ([facility])    non small cell lung cancer favoring adenocarcinoma   . Lung nodules   . Migraine   . Odynophagia 05/24/2016  . Pneumonia   . PONV (postoperative nausea and vomiting)   . Wears glasses     ALLERGIES:  is allergic to penicillins; chantix [varenicline]; and levaquin [levofloxacin].  MEDICATIONS:  Current Outpatient Prescriptions    Medication Sig Dispense Refill  . Calcium-Magnesium-Vitamin D 185-50-100 MG-MG-UNIT CAPS Take 1 tablet by mouth every evening.    . cetirizine (ZYRTEC) 10 MG tablet Take 10 mg by mouth daily.    Marland Kitchen ELDERBERRY PO Take 2 tablets by mouth daily. Chewable Supplement    . Green Tea, Camillia sinensis, (GREEN TEA EXTRACT PO) Take by mouth.    . Lactobacillus (PROBIOTIC ACIDOPHILUS) CAPS Take by mouth.    . MELATONIN GUMMIES PO Take by mouth.    . Milk Thistle 1000 MG CAPS Take 1 capsule by mouth daily.    . Misc Natural Products (BLACK COHOSH MENOPAUSE COMPLEX) TABS Take 1 capsule by mouth 2 (two) times daily.    . Misc Natural Products (TURMERIC CURCUMIN) CAPS Take 1 capsule by mouth every evening. 1500 mg capsule    . Multiple Vitamins-Minerals (WOMENS 50+ MULTI VITAMIN/MIN) TABS Take 1 tablet by mouth daily.    Marland Kitchen OVER THE COUNTER MEDICATION Take 250 mg by mouth daily. Jiaogulan Supplement    . pantoprazole (PROTONIX) 40 MG tablet Take 1 tablet (40 mg total) by mouth every evening. 90 tablet 3  . pravastatin (PRAVACHOL) 20 MG tablet Take 20 mg by mouth every evening.  3  . ranitidine (ZANTAC) 150 MG tablet Take 150-300 mg by mouth every evening. 300 mg with more acidic meals    . benzonatate (TESSALON) 100 MG capsule Take 1 capsule (100 mg total) by mouth every 4 (four) hours as needed for cough. (Patient not taking: Reported on 08/25/2016) 60 capsule 1  . diphenhydrAMINE (BENADRYL) 25 mg capsule Take 25 mg by mouth at bedtime as needed.    Marland Kitchen  naproxen sodium (ANAPROX) 220 MG tablet Take 220-440 mg by mouth 2 (two) times daily as needed (for pain).     . prochlorperazine (COMPAZINE) 10 MG tablet Take 1 tablet (10 mg total) by mouth every 6 (six) hours as needed for nausea or vomiting. (Patient not taking: Reported on 08/11/2016) 30 tablet 0  . sucralfate (CARAFATE) 1 g tablet Take 1 tablet (1 g total) by mouth 4 (four) times daily -  with meals and at bedtime. 5 min before meals for radiation induced  esophagitis (Patient not taking: Reported on 08/11/2016) 120 tablet 2   No current facility-administered medications for this visit.     SURGICAL HISTORY:  Past Surgical History:  Procedure Laterality Date  . ABDOMINAL HYSTERECTOMY    . VIDEO BRONCHOSCOPY Bilateral 07/03/2015   Procedure: VIDEO BRONCHOSCOPY WITH FLUORO;  Surgeon: Marshell Garfinkel, MD;  Location: [facility];  Service: Cardiopulmonary;  Laterality: Bilateral;  . VIDEO BRONCHOSCOPY WITH ENDOBRONCHIAL ULTRASOUND N/A 04/06/2016   Procedure: VIDEO BRONCHOSCOPY WITH ENDOBRONCHIAL ULTRASOUND;  Surgeon: Collene Gobble, MD;  Location: [facility];  Service: Thoracic;  Laterality: N/A;  . WRIST FRACTURE SURGERY      REVIEW OF SYSTEMS:  A comprehensive review of systems was negative.   PHYSICAL EXAMINATION: General appearance: alert, cooperative and no distress Head: Normocephalic, without obvious abnormality, atraumatic Neck: no adenopathy, no JVD, supple, symmetrical, trachea midline and thyroid not enlarged, symmetric, no tenderness/mass/nodules Lymph nodes: Cervical, supraclavicular, and axillary nodes normal. Resp: clear to auscultation bilaterally Back: symmetric, no curvature. ROM normal. No CVA tenderness. Cardio: regular rate and rhythm, S1, S2 normal, no murmur, click, rub or gallop GI: soft, non-tender; bowel sounds normal; no masses,  no organomegaly Extremities: extremities normal, atraumatic, no cyanosis or edema  ECOG PERFORMANCE STATUS: 1 - Symptomatic but completely ambulatory  Blood pressure (!) 109/95, pulse 96, temperature 98.3 F (36.8 C), temperature source Oral, resp. rate 18, weight 191 lb 4.8 oz (86.8 kg), SpO2 96 %.  LABORATORY DATA: Lab Results  Component Value Date   WBC 5.3 08/25/2016   HGB 13.8 08/25/2016   HCT 40.4 08/25/2016   MCV 97.1 08/25/2016   PLT 250 08/25/2016      Chemistry      Component Value Date/Time   NA 141 08/25/2016 1203   K 3.9 08/25/2016 1203   CL 107 04/06/2016 0742   CO2  27 08/25/2016 1203   BUN 7.6 08/25/2016 1203   CREATININE 0.8 08/25/2016 1203      Component Value Date/Time   CALCIUM 10.0 08/25/2016 1203   ALKPHOS 91 08/25/2016 1203   AST 22 08/25/2016 1203   ALT 32 08/25/2016 1203   BILITOT 0.58 08/25/2016 1203       RADIOGRAPHIC STUDIES: No results found.  ASSESSMENT AND PLAN:  This is a very pleasant 51 years old white female with a stage IIIa non-small cell lung cancer, adenocarcinoma status post concurrent chemoradiation and currently undergoing consolidation treatment with immunotherapy with Imfinzi (Durvalumab) status post 1 cycle. She tolerated the first cycle of her treatment well with no significant adverse effects. I recommended for the patient to proceed with cycle #2 today as a scheduled. I will see her back for follow-up visit in 2 weeks for reevaluation before the next dose of her treatment. She was advised to call immediately if she has any concerning symptoms in the interval. The patient voices understanding of current disease status and treatment options and is in agreement with the current care plan. All questions were answered.  The patient knows to call the clinic with any problems, questions or concerns. We can certainly see the patient much sooner if necessary. I spent 10 minutes counseling the patient face to face. The total time spent in the appointment was 15 minutes.  Disclaimer: This note was dictated with voice recognition software. Similar sounding words can inadvertently be transcribed and may not be corrected upon review.

## 2016-08-26 NOTE — Progress Notes (Signed)
[place] Work  Clinical Social Work was referred by patient's friend for assessment of psychosocial needs.  Clinical Social Worker met with patient in [place] office to offer support and assess for needs.  Ms. Milholland reports her main concerns are finances and determining if she is eligible for Social Security Disability.  The patient is currently working, but having difficulty balancing work and side effects of cancer treatment.  CSW made referral to [employer] to help assist patient in applying for SSDisability. CSW completed [employer] forms and sent [facility] [facility] application for gas cards.  Patient agreed to contact [facility] to begin application process. Ms. Cheeks had questions regarding her [facility] medical bills and determining assistance.  CSW unaware of any resources, provided contact information for Stefanie Libel, financial advocate.  CSW briefly explored supportive programs available to patient including counseling, support groups, and classes.  Ms. Goga shared she was most interested in yoga.  CSW provided information regarding these options and encouraged patient to call CSW with any other questions or concerns.    Maryjean Morn, MSW, LCSW, OSW-C Clinical Social Worker [facility] [phone]

## 2016-08-27 ENCOUNTER — Encounter: Payer: Self-pay | Admitting: Internal Medicine

## 2016-08-29 ENCOUNTER — Telehealth: Payer: Self-pay

## 2016-08-29 NOTE — Telephone Encounter (Signed)
Pt is having a few side affects. Heart rate irregular. dizzyness and almost passed out. This happened Saturday. She got up from sitting in car for a long time and ended up hanging onto the door for a little bit. Only happened this once. She is drinking about 64 oz of water. She had d2c2 imfinzi. Has a hard time getting warmed up when cold and woke up in a sweat last night. When she takes a deep breath it feels like she wants to cough. She is coughing at times, when she does bring anything up it is clear. She is not aware of a fever.  She is calling to let Dr Julien Nordmann be aware. She does not feel she needs to be seen at this time.  Instruction was given to watch if cough gets worse, sputum becomes colored or she runs fever to call [place].

## 2016-09-08 ENCOUNTER — Ambulatory Visit (HOSPITAL_BASED_OUTPATIENT_CLINIC_OR_DEPARTMENT_OTHER): Payer: Managed Care, Other (non HMO) | Admitting: Internal Medicine

## 2016-09-08 ENCOUNTER — Other Ambulatory Visit (HOSPITAL_BASED_OUTPATIENT_CLINIC_OR_DEPARTMENT_OTHER): Payer: Managed Care, Other (non HMO)

## 2016-09-08 ENCOUNTER — Encounter: Payer: Self-pay | Admitting: Internal Medicine

## 2016-09-08 ENCOUNTER — Ambulatory Visit (HOSPITAL_BASED_OUTPATIENT_CLINIC_OR_DEPARTMENT_OTHER): Payer: Managed Care, Other (non HMO)

## 2016-09-08 ENCOUNTER — Telehealth: Payer: Self-pay | Admitting: Internal Medicine

## 2016-09-08 DIAGNOSIS — R1310 Dysphagia, unspecified: Secondary | ICD-10-CM

## 2016-09-08 DIAGNOSIS — C3492 Malignant neoplasm of unspecified part of left bronchus or lung: Secondary | ICD-10-CM

## 2016-09-08 DIAGNOSIS — Z79899 Other long term (current) drug therapy: Secondary | ICD-10-CM | POA: Diagnosis not present

## 2016-09-08 DIAGNOSIS — R002 Palpitations: Secondary | ICD-10-CM | POA: Diagnosis not present

## 2016-09-08 DIAGNOSIS — Z5112 Encounter for antineoplastic immunotherapy: Secondary | ICD-10-CM | POA: Diagnosis not present

## 2016-09-08 DIAGNOSIS — C3412 Malignant neoplasm of upper lobe, left bronchus or lung: Secondary | ICD-10-CM

## 2016-09-08 DIAGNOSIS — I479 Paroxysmal tachycardia, unspecified: Secondary | ICD-10-CM

## 2016-09-08 DIAGNOSIS — Z5111 Encounter for antineoplastic chemotherapy: Secondary | ICD-10-CM

## 2016-09-08 HISTORY — DX: Paroxysmal tachycardia, unspecified: I47.9

## 2016-09-08 LAB — CBC WITH DIFFERENTIAL/PLATELET
BASO%: 0.6 % (ref 0.0–2.0)
Basophils Absolute: 0 10*3/uL (ref 0.0–0.1)
EOS ABS: 0.1 10*3/uL (ref 0.0–0.5)
EOS%: 0.9 % (ref 0.0–7.0)
HEMATOCRIT: 42.5 % (ref 34.8–46.6)
HEMOGLOBIN: 14.6 g/dL (ref 11.6–15.9)
LYMPH#: 1.4 10*3/uL (ref 0.9–3.3)
LYMPH%: 20.7 % (ref 14.0–49.7)
MCH: 32.9 pg (ref 25.1–34.0)
MCHC: 34.3 g/dL (ref 31.5–36.0)
MCV: 96.1 fL (ref 79.5–101.0)
MONO#: 0.7 10*3/uL (ref 0.1–0.9)
MONO%: 10.1 % (ref 0.0–14.0)
NEUT%: 67.7 % (ref 38.4–76.8)
NEUTROS ABS: 4.6 10*3/uL (ref 1.5–6.5)
PLATELETS: 282 10*3/uL (ref 145–400)
RBC: 4.42 10*6/uL (ref 3.70–5.45)
RDW: 11.9 % (ref 11.2–14.5)
WBC: 6.8 10*3/uL (ref 3.9–10.3)

## 2016-09-08 LAB — COMPREHENSIVE METABOLIC PANEL
ALT: 23 U/L (ref 0–55)
ANION GAP: 10 meq/L (ref 3–11)
AST: 22 U/L (ref 5–34)
Albumin: 3.7 g/dL (ref 3.5–5.0)
Alkaline Phosphatase: 92 U/L (ref 40–150)
BUN: 7.9 mg/dL (ref 7.0–26.0)
CHLORIDE: 107 meq/L (ref 98–109)
CO2: 25 meq/L (ref 22–29)
Calcium: 9.6 mg/dL (ref 8.4–10.4)
Creatinine: 0.8 mg/dL (ref 0.6–1.1)
EGFR: 88 mL/min/{1.73_m2} — AB (ref >90)
Glucose: 111 mg/dl (ref 70–140)
Potassium: 3.9 mEq/L (ref 3.5–5.1)
Sodium: 142 mEq/L (ref 136–145)
Total Bilirubin: 0.4 mg/dL (ref 0.20–1.20)
Total Protein: 7.3 g/dL (ref 6.4–8.3)

## 2016-09-08 LAB — TSH: TSH: 1.985 m(IU)/L (ref 0.308–3.960)

## 2016-09-08 MED ORDER — DURVALUMAB 500 MG/10ML IV SOLN
10.0000 mg/kg | Freq: Once | INTRAVENOUS | Status: AC
  Administered 2016-09-08: 860 mg via INTRAVENOUS
  Filled 2016-09-08: qty 10

## 2016-09-08 MED ORDER — BENZONATATE 100 MG PO CAPS: 100.0000 mg | ORAL_CAPSULE | ORAL | 1 refills | Status: DC | PRN

## 2016-09-08 MED ORDER — SODIUM CHLORIDE 0.9 % IV SOLN
Freq: Once | INTRAVENOUS | Status: AC
  Administered 2016-09-08: 13:00:00 via INTRAVENOUS

## 2016-09-08 NOTE — Telephone Encounter (Signed)
Appointments scheduled per 4.12.18 LOS. Patient given AVS report and calendars with future scheduled appointments. °

## 2016-09-08 NOTE — Patient Instructions (Signed)
[facility] Discharge Instructions for Patients Receiving Chemotherapy  Today you received the following chemotherapy agents imfenzi  To help prevent nausea and vomiting after your treatment, we encourage you to take your nausea medication as directed  If you develop nausea and vomiting that is not controlled by your nausea medication, call the clinic.   BELOW ARE SYMPTOMS THAT SHOULD BE REPORTED IMMEDIATELY:  *FEVER GREATER THAN 100.5 F  *CHILLS WITH OR WITHOUT FEVER  NAUSEA AND VOMITING THAT IS NOT CONTROLLED WITH YOUR NAUSEA MEDICATION  *UNUSUAL SHORTNESS OF BREATH  *UNUSUAL BRUISING OR BLEEDING  TENDERNESS IN MOUTH AND THROAT WITH OR WITHOUT PRESENCE OF ULCERS  *URINARY PROBLEMS  *BOWEL PROBLEMS  UNUSUAL RASH Items with * indicate a potential emergency and should be followed up as soon as possible.  Feel free to call the clinic you have any questions or concerns. The clinic phone number is [phone].

## 2016-09-09 ENCOUNTER — Ambulatory Visit: Payer: Managed Care, Other (non HMO) | Admitting: Cardiology

## 2016-09-10 ENCOUNTER — Encounter: Payer: Self-pay | Admitting: Internal Medicine

## 2016-09-10 NOTE — Progress Notes (Signed)
[facility] [phone]   [phone]  OFFICE PROGRESS NOTE  Gennette Pac, MD [place] [address]  DIAGNOSIS: Stage IIIA (T2a, N2, M0) non-small cell lung cancer, adenocarcinoma presented with left suprahilar mass, mediastinal lymphadenopathy and suspicious pulmonary nodule in the left upper lobe diagnosed in November 2017. No actionable mutations on [employer] testing.  PRIOR THERAPY: Status post concurrent chemoradiation with weekly carboplatin for AUC of 2 and paclitaxel 45 MG/M2 for 7 cycles. Last dose was given 06/13/2016.  CURRENT THERAPY: Immunotherapy with Imfinzi (Durvalumab) 10 MG/KG every 2 weeks. First dose 08/11/2016. Status post 2 cycles.  INTERVAL HISTORY: Debbie Baker 51 y.o. female metastasis clinic today for follow-up visit. The patient is feeling fine today was no specific complaints. She tolerated the second cycle of her treatment with immunotherapy fairly well. She mentioned 1 or 2 episodes of tachycardia and palpitations after the treatment. The total dwell he lost around 1 minute and resolving spontaneously. She denied having any chest pain, shortness of breath, cough or hemoptysis. She denied having any nausea or vomiting, diarrhea or constipation. She denied having any skin rash. She has no weight loss or night sweats. She is here today for evaluation before starting cycle #3.   MEDICAL HISTORY: Past Medical History:  Diagnosis Date  . Allergic rhinitis   . Deaf, left   . Diverticulosis   . Dyspnea   . Encounter for antineoplastic chemotherapy 04/18/2016  . Encounter for antineoplastic immunotherapy 07/21/2016  . GERD (gastroesophageal reflux disease)   . Hyperlipidemia   . Lung cancer ([facility])    non small cell lung cancer favoring adenocarcinoma   . Lung nodules   . Migraine   . Odynophagia 05/24/2016  . Pneumonia   . PONV (postoperative nausea and vomiting)   . Tachycardia, paroxysmal  ([facility]) 09/08/2016  . Wears glasses     ALLERGIES:  is allergic to penicillins; chantix [varenicline]; and levaquin [levofloxacin].  MEDICATIONS:  Current Outpatient Prescriptions  Medication Sig Dispense Refill  . Calcium-Magnesium-Vitamin D 185-50-100 MG-MG-UNIT CAPS Take 1 tablet by mouth every evening.    . cetirizine (ZYRTEC) 10 MG tablet Take 10 mg by mouth daily.    . diphenhydrAMINE (BENADRYL) 25 mg capsule Take 25 mg by mouth at bedtime as needed.    Marland Kitchen ELDERBERRY PO Take 2 tablets by mouth daily. Chewable Supplement    . Green Tea, Camillia sinensis, (GREEN TEA EXTRACT PO) Take by mouth.    . Lactobacillus (PROBIOTIC ACIDOPHILUS) CAPS Take by mouth.    . MELATONIN GUMMIES PO Take by mouth.    . Milk Thistle 1000 MG CAPS Take 1 capsule by mouth daily.    . Misc Natural Products (BLACK COHOSH MENOPAUSE COMPLEX) TABS Take 1 capsule by mouth 2 (two) times daily.    . Misc Natural Products (TURMERIC CURCUMIN) CAPS Take 1 capsule by mouth every evening. 1500 mg capsule    . Multiple Vitamins-Minerals (WOMENS 50+ MULTI VITAMIN/MIN) TABS Take 1 tablet by mouth daily.    . naproxen sodium (ANAPROX) 220 MG tablet Take 220-440 mg by mouth 2 (two) times daily as needed (for pain).     Marland Kitchen OVER THE COUNTER MEDICATION Take 250 mg by mouth daily. Jiaogulan Supplement    . pantoprazole (PROTONIX) 40 MG tablet Take 1 tablet (40 mg total) by mouth every evening. 90 tablet 3  . pravastatin (PRAVACHOL) 20 MG tablet Take 20 mg by mouth every evening.  3  .  ranitidine (ZANTAC) 150 MG tablet Take 150-300 mg by mouth every evening. 300 mg with more acidic meals    . benzonatate (TESSALON) 100 MG capsule Take 1 capsule (100 mg total) by mouth every 4 (four) hours as needed for cough. 60 capsule 1  . prochlorperazine (COMPAZINE) 10 MG tablet Take 1 tablet (10 mg total) by mouth every 6 (six) hours as needed for nausea or vomiting. (Patient not taking: Reported on 08/11/2016) 30 tablet 0  . sucralfate (CARAFATE)  1 g tablet Take 1 tablet (1 g total) by mouth 4 (four) times daily -  with meals and at bedtime. 5 min before meals for radiation induced esophagitis (Patient not taking: Reported on 08/11/2016) 120 tablet 2   No current facility-administered medications for this visit.     SURGICAL HISTORY:  Past Surgical History:  Procedure Laterality Date  . ABDOMINAL HYSTERECTOMY    . VIDEO BRONCHOSCOPY Bilateral 07/03/2015   Procedure: VIDEO BRONCHOSCOPY WITH FLUORO;  Surgeon: Marshell Garfinkel, MD;  Location: [facility];  Service: Cardiopulmonary;  Laterality: Bilateral;  . VIDEO BRONCHOSCOPY WITH ENDOBRONCHIAL ULTRASOUND N/A 04/06/2016   Procedure: VIDEO BRONCHOSCOPY WITH ENDOBRONCHIAL ULTRASOUND;  Surgeon: Collene Gobble, MD;  Location: [facility];  Service: Thoracic;  Laterality: N/A;  . WRIST FRACTURE SURGERY      REVIEW OF SYSTEMS:  Constitutional: negative Eyes: negative Ears, nose, mouth, throat, and face: negative Respiratory: negative Cardiovascular: positive for palpitations Gastrointestinal: negative Genitourinary:negative Integument/breast: negative Hematologic/lymphatic: negative Musculoskeletal:negative Neurological: negative Behavioral/Psych: negative Endocrine: negative Allergic/Immunologic: negative   PHYSICAL EXAMINATION: General appearance: alert, cooperative and no distress Head: Normocephalic, without obvious abnormality, atraumatic Neck: no adenopathy, no JVD, supple, symmetrical, trachea midline and thyroid not enlarged, symmetric, no tenderness/mass/nodules Lymph nodes: Cervical, supraclavicular, and axillary nodes normal. Resp: clear to auscultation bilaterally Back: symmetric, no curvature. ROM normal. No CVA tenderness. Cardio: regular rate and rhythm, S1, S2 normal, no murmur, click, rub or gallop GI: soft, non-tender; bowel sounds normal; no masses,  no organomegaly Extremities: extremities normal, atraumatic, no cyanosis or edema Neurologic: Alert and oriented X 3,  normal strength and tone. Normal symmetric reflexes. Normal coordination and gait  ECOG PERFORMANCE STATUS: 1 - Symptomatic but completely ambulatory  Blood pressure 106/64, pulse 90, temperature 98.1 F (36.7 C), temperature source Oral, resp. rate 18, height '5\' 8"'$  (1.727 m), weight 192 lb 1.6 oz (87.1 kg), SpO2 100 %.  LABORATORY DATA: Lab Results  Component Value Date   WBC 6.8 09/08/2016   HGB 14.6 09/08/2016   HCT 42.5 09/08/2016   MCV 96.1 09/08/2016   PLT 282 09/08/2016      Chemistry      Component Value Date/Time   NA 142 09/08/2016 1113   K 3.9 09/08/2016 1113   CL 107 04/06/2016 0742   CO2 25 09/08/2016 1113   BUN 7.9 09/08/2016 1113   CREATININE 0.8 09/08/2016 1113      Component Value Date/Time   CALCIUM 9.6 09/08/2016 1113   ALKPHOS 92 09/08/2016 1113   AST 22 09/08/2016 1113   ALT 23 09/08/2016 1113   BILITOT 0.40 09/08/2016 1113       RADIOGRAPHIC STUDIES: No results found.  ASSESSMENT AND PLAN:  This is a very pleasant 51 years old white female with history of stage IIIa non-small cell lung cancer, adenocarcinoma status post concurrent chemoradiation with partial response. She is currently undergoing treatment with consolidation immunotherapy with Imfinzi (Durvalumab) is status post 2 cycles and tolerated the second cycle well with no significant adverse  effects except for few episodes of palpitation. I had a lengthy discussion with the patient today about her condition. I recommended for her to continue her treatment with Imfinzi (Durvalumab) today as a scheduled and to monitor any more episodes of palpitation. For the palpitation, I will refer the patient to cardiology for evaluation and to rule out any cardiac abnormalities. I will see the patient back for follow-up visit in 2 weeks for evaluation before starting the next dose of her treatment. She was advised to call immediately if she has any concerning symptoms in the interval. The patient voices  understanding of current disease status and treatment options and is in agreement with the current care plan. All questions were answered. The patient knows to call the clinic with any problems, questions or concerns. We can certainly see the patient much sooner if necessary.  Disclaimer: This note was dictated with voice recognition software. Similar sounding words can inadvertently be transcribed and may not be corrected upon review.

## 2016-09-14 ENCOUNTER — Ambulatory Visit (INDEPENDENT_AMBULATORY_CARE_PROVIDER_SITE_OTHER): Payer: Managed Care, Other (non HMO) | Admitting: Cardiovascular Disease

## 2016-09-14 ENCOUNTER — Encounter: Payer: Self-pay | Admitting: Cardiovascular Disease

## 2016-09-14 VITALS — BP 104/78 | HR 82 | Ht 67.5 in | Wt 194.0 lb

## 2016-09-14 DIAGNOSIS — R002 Palpitations: Secondary | ICD-10-CM | POA: Diagnosis not present

## 2016-09-14 DIAGNOSIS — I479 Paroxysmal tachycardia, unspecified: Secondary | ICD-10-CM

## 2016-09-14 DIAGNOSIS — E785 Hyperlipidemia, unspecified: Secondary | ICD-10-CM | POA: Insufficient documentation

## 2016-09-14 NOTE — Assessment & Plan Note (Signed)
History of hyperlipidemia on statin therapy followed by her PCP. 

## 2016-09-14 NOTE — Patient Instructions (Signed)
Medication Instructions: Your physician recommends that you continue on your current medications as directed. Please refer to the Current Medication list given to you today.   Testing/Procedures: Your physician has requested that you have an echocardiogram. Echocardiography is a painless test that uses sound waves to create images of your heart. It provides your doctor with information about the size and shape of your heart and how well your heart's chambers and valves are working. This procedure takes approximately one hour. There are no restrictions for this procedure.  Your physician has recommended that you wear a 14 day event monitor. Event monitors are medical devices that record the heart's electrical activity. Doctors most often [place] these monitors to diagnose arrhythmias. Arrhythmias are problems with the speed or rhythm of the heartbeat. The monitor is a small, portable device. You can wear one while you do your normal daily activities. This is usually used to diagnose what is causing palpitations/syncope (passing out).  (Both will be done at our [facility]  Follow-Up: Your physician recommends that you schedule a follow-up appointment with Dr. Gwenlyn Found after testing.  If you need a refill on your cardiac medications before your next appointment, please call your pharmacy.

## 2016-09-14 NOTE — Assessment & Plan Note (Signed)
Recent onset Palpitations over the last 10 days. They occur every other day lasting less than 1 minute time. There are no other associated symptoms. They are brought on by excessive caffeine intake in the form of energy drinks. She is also perimenopausal. 2-D echo to event monitor and will see back after that for further evaluation.

## 2016-09-14 NOTE — Progress Notes (Signed)
09/14/2016 Debbie Baker   02-20-1966  [id number]  Primary Physician Gennette Pac, MD Primary Cardiologist: Lorretta Harp MD Renae Gloss  HPI:  [name] is a pleasant 51 year old mildly overweight divorced Caucasian female mother of 2 living children, grandmother to grandchildren referred by Dr. Earlie Server for evaluation of new onset palpitations. Unfortunately, her 34 year old son recently overdosed on heroin on the Thanksgiving and expired. She works in a clerical position. 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 She does have hyperlipidemia on statin therapy. She was diagnosed with non-small cell lung cancer 04/08/16 of her left lung has undergone chemotherapy and radiation therapy. She is also perimenopausal. She does not drink caffeine on a regular basis but does drink "energy drinks". She's noticed new onset palpitations over the last 10 days occurring every other day lasting a minute at a time. There are no other associated symptoms.   Current Outpatient Prescriptions  Medication Sig Dispense Refill  . benzonatate (TESSALON) 100 MG capsule Take 1 capsule (100 mg total) by mouth every 4 (four) hours as needed for cough. 60 capsule 1  . Calcium-Magnesium-Vitamin D 185-50-100 MG-MG-UNIT CAPS Take 1 tablet by mouth every evening.    . cetirizine (ZYRTEC) 10 MG tablet Take 10 mg by mouth daily.    . diphenhydrAMINE (BENADRYL) 25 mg capsule Take 25 mg by mouth at bedtime as needed.    . durvalumab (IMFINZI) 120 MG/2.4ML SOLN injection Inject 120 mg into the vein once a week.    Marland Kitchen ELDERBERRY PO Take 2 tablets by mouth daily. Chewable Supplement    . Green Tea, Camillia sinensis, (GREEN TEA EXTRACT PO) Take by mouth.    . Lactobacillus (PROBIOTIC ACIDOPHILUS) CAPS Take by mouth.    . MELATONIN GUMMIES PO Take by mouth.    . Milk Thistle 1000 MG CAPS Take 1 capsule by mouth daily.    . Misc Natural Products (BLACK COHOSH MENOPAUSE COMPLEX)  TABS Take 1 capsule by mouth 2 (two) times daily.    . Misc Natural Products (TURMERIC CURCUMIN) CAPS Take 1 capsule by mouth every evening. 1500 mg capsule    . Multiple Vitamins-Minerals (WOMENS 50+ MULTI VITAMIN/MIN) TABS Take 1 tablet by mouth daily.    . naproxen sodium (ANAPROX) 220 MG tablet Take 220-440 mg by mouth 2 (two) times daily as needed (for pain).     Marland Kitchen OVER THE COUNTER MEDICATION Take 250 mg by mouth daily. Jiaogulan Supplement    . pantoprazole (PROTONIX) 40 MG tablet Take 1 tablet (40 mg total) by mouth every evening. 90 tablet 3  . pravastatin (PRAVACHOL) 20 MG tablet Take 20 mg by mouth every evening.  3  . prochlorperazine (COMPAZINE) 10 MG tablet Take 1 tablet (10 mg total) by mouth every 6 (six) hours as needed for nausea or vomiting. 30 tablet 0  . ranitidine (ZANTAC) 150 MG tablet Take 150-300 mg by mouth every evening. 300 mg with more acidic meals    . sucralfate (CARAFATE) 1 g tablet Take 1 tablet (1 g total) by mouth 4 (four) times daily -  with meals and at bedtime. 5 min before meals for radiation induced esophagitis 120 tablet 2   No current facility-administered medications for this visit.     Allergies  Allergen Reactions  . Penicillins Anaphylaxis and Rash     Has patient had a PCN reaction causing immediate rash, facial/tongue/throat swelling, SOB or lightheadedness with hypotension: # # YES # #  Has patient had a PCN reaction causing severe rash involving mucus membranes or skin necrosis: No Has patient had a PCN reaction that required hospitalization No Has patient had a PCN reaction occurring within the last 10 years: # # YES # #  If all of the above answers are "NO", then may proceed with Cephalosporin use.   . Chantix [Varenicline] Other (See Comments)    Nightmares/lack of sleep  . Levaquin [Levofloxacin] Other (See Comments)    Muscle weakness/difficulty walking    Social History   Social History  . Marital status: Single    Spouse name:  N/A  . Number of children: N/A  . Years of education: N/A   Occupational History  . Not on file.   Social History Main Topics  . Smoking status: Former Smoker    Packs/day: 1.50    Years: 30.00    Types: Cigarettes    Quit date: 08/02/2010  . Smokeless tobacco: Never Used  . Alcohol use 0.0 oz/week     Comment: 1-2 drinks daily  . Drug use: Yes    Types: Marijuana     Comment: last use 02/2016  . Sexual activity: Yes    Birth control/ protection: Surgical   Other Topics Concern  . Not on file   Social History Narrative   10 cats   Lives with boyfriend   Not employed   Former employee of [employer]   3 children living in [place]: General: negative for chills, fever, night sweats or weight changes.  Cardiovascular: negative for chest pain, dyspnea on exertion, edema, orthopnea, palpitations, paroxysmal nocturnal dyspnea or shortness of breath Dermatological: negative for rash Respiratory: negative for cough or wheezing Urologic: negative for hematuria Abdominal: negative for nausea, vomiting, diarrhea, bright red blood per rectum, melena, or hematemesis Neurologic: negative for visual changes, syncope, or dizziness All other systems reviewed and are otherwise negative except as noted above.    Blood pressure 104/78, pulse 82, height 5' 7.5" (1.715 m), weight 194 lb (88 kg).  General appearance: alert and no distress Neck: no adenopathy, no carotid bruit, no JVD, supple, symmetrical, trachea midline and thyroid not enlarged, symmetric, no tenderness/mass/nodules Lungs: clear to auscultation bilaterally Heart: regular rate and rhythm, S1, S2 normal, no murmur, click, rub or gallop Extremities: extremities normal, atraumatic, no cyanosis or edema  EKG sinus rhythm at 82 with left axis deviation and poor R-wave progression. I personally reviewed this EKG.  ASSESSMENT AND PLAN:   Hyperlipidemia History of hyperlipidemia on statin  therapy followed by her PCP  Tachycardia, paroxysmal (HCC) Recent onset Palpitations over the last 10 days. They occur every other day lasting less than 1 minute time. There are no other associated symptoms. They are brought on by excessive caffeine intake in the form of energy drinks. She is also perimenopausal. 2-D echo to event monitor and will see back after that for further evaluation.      Lorretta Harp MD FACP,FACC,FAHA, [facility] 09/14/2016 3:49 PM

## 2016-09-16 ENCOUNTER — Telehealth: Payer: Self-pay | Admitting: *Deleted

## 2016-09-16 MED ORDER — ALBUTEROL SULFATE HFA 108 (90 BASE) MCG/ACT IN AERS: 2.0000 | INHALATION_SPRAY | Freq: Four times a day (QID) | RESPIRATORY_TRACT | 0 refills | Status: DC | PRN

## 2016-09-16 NOTE — Telephone Encounter (Signed)
Per Dr Benay Spice, "inhaler will not help cough & pt should call lung Dr for inhaler.  If she doesn't have one, try albuterol inhaler 2 puffs every 6 hours prn."  Discussed with pt & she states she has not seen lung Dr in a while & he turned everything over to Dr Julien Nordmann.  She was very adamant about this.  Informed that we would call in albuterol & to discuss with Dr Julien Nordmann at next visit. She expressed understanding.

## 2016-09-16 NOTE — Telephone Encounter (Signed)
Received call from pt stating that she is on immunotherapy for her non small cell lung cancer-(Imfenzi) & reports having trouble catching her breath & having some coughing spells.  She states this could be related to the pollen also but Dr Julien Nordmann talked about an inhaler & she is wondering if a prescription could be called in. She reports the coughing spells are every day.  She can be reached at [phone].  Will discuss with Dr Benay Spice in Dr Worthy Flank absence.

## 2016-09-20 ENCOUNTER — Telehealth: Payer: Self-pay | Admitting: *Deleted

## 2016-09-20 NOTE — Telephone Encounter (Signed)
"  I receive Imfinzi treatments.  This treatment can cause pneumonia and I think I may have pneumonia.  I've coughed all along due to cancer but my cough has worsened.  Last week I received an inhaler which I uses as needed.  When I'm lying down, and exhale I can hear rattling.  I am short of breath.  This morning when I coughed there was a streak of blood.  Just once this morning saw streaks of blood.  I am really tired."  (Temp = 97.1 at 10:14 am checked during call)  "I'm scheduled to be seen 09-22-2016.  I do not think I need to be seen today but wanted to make you aware.  Return number [phone]."

## 2016-09-22 ENCOUNTER — Other Ambulatory Visit (HOSPITAL_BASED_OUTPATIENT_CLINIC_OR_DEPARTMENT_OTHER): Payer: Managed Care, Other (non HMO)

## 2016-09-22 ENCOUNTER — Ambulatory Visit: Payer: Managed Care, Other (non HMO)

## 2016-09-22 ENCOUNTER — Ambulatory Visit (HOSPITAL_COMMUNITY)
Admission: RE | Admit: 2016-09-22 | Discharge: 2016-09-22 | Disposition: A | Discharge status: Home / Self Care | Payer: Managed Care, Other (non HMO) | Source: Ambulatory Visit | Attending: Nurse Practitioner | Admitting: Nurse Practitioner

## 2016-09-22 ENCOUNTER — Ambulatory Visit (HOSPITAL_BASED_OUTPATIENT_CLINIC_OR_DEPARTMENT_OTHER): Payer: Managed Care, Other (non HMO) | Admitting: Nurse Practitioner

## 2016-09-22 VITALS — BP 120/80 | HR 93 | Temp 98.7°F | Resp 18 | Ht 67.5 in | Wt 196.3 lb

## 2016-09-22 DIAGNOSIS — R918 Other nonspecific abnormal finding of lung field: Secondary | ICD-10-CM | POA: Diagnosis not present

## 2016-09-22 DIAGNOSIS — C3492 Malignant neoplasm of unspecified part of left bronchus or lung: Secondary | ICD-10-CM | POA: Diagnosis present

## 2016-09-22 DIAGNOSIS — Z9889 Other specified postprocedural states: Secondary | ICD-10-CM | POA: Diagnosis not present

## 2016-09-22 DIAGNOSIS — C3412 Malignant neoplasm of upper lobe, left bronchus or lung: Secondary | ICD-10-CM | POA: Diagnosis not present

## 2016-09-22 LAB — CBC WITH DIFFERENTIAL/PLATELET
BASO%: 1 % (ref 0.0–2.0)
Basophils Absolute: 0 10*3/uL (ref 0.0–0.1)
EOS%: 2.2 % (ref 0.0–7.0)
Eosinophils Absolute: 0.1 10*3/uL (ref 0.0–0.5)
HCT: 42.1 % (ref 34.8–46.6)
HEMOGLOBIN: 14.4 g/dL (ref 11.6–15.9)
LYMPH#: 1.1 10*3/uL (ref 0.9–3.3)
LYMPH%: 27.8 % (ref 14.0–49.7)
MCH: 32.9 pg (ref 25.1–34.0)
MCHC: 34.2 g/dL (ref 31.5–36.0)
MCV: 96.1 fL (ref 79.5–101.0)
MONO#: 0.3 10*3/uL (ref 0.1–0.9)
MONO%: 8 % (ref 0.0–14.0)
NEUT%: 61 % (ref 38.4–76.8)
NEUTROS ABS: 2.5 10*3/uL (ref 1.5–6.5)
Platelets: 236 10*3/uL (ref 145–400)
RBC: 4.38 10*6/uL (ref 3.70–5.45)
RDW: 11.7 % (ref 11.2–14.5)
WBC: 4.1 10*3/uL (ref 3.9–10.3)

## 2016-09-22 LAB — COMPREHENSIVE METABOLIC PANEL
ALBUMIN: 3.7 g/dL (ref 3.5–5.0)
ALT: 19 U/L (ref 0–55)
AST: 20 U/L (ref 5–34)
Alkaline Phosphatase: 97 U/L (ref 40–150)
Anion Gap: 11 mEq/L (ref 3–11)
BUN: 7.8 mg/dL (ref 7.0–26.0)
CO2: 25 mEq/L (ref 22–29)
CREATININE: 0.8 mg/dL (ref 0.6–1.1)
Calcium: 9.5 mg/dL (ref 8.4–10.4)
Chloride: 106 mEq/L (ref 98–109)
EGFR: 80 mL/min/{1.73_m2} — ABNORMAL LOW (ref >90)
GLUCOSE: 100 mg/dL (ref 70–140)
Potassium: 4 mEq/L (ref 3.5–5.1)
SODIUM: 143 meq/L (ref 136–145)
Total Bilirubin: 0.4 mg/dL (ref 0.20–1.20)
Total Protein: 7 g/dL (ref 6.4–8.3)

## 2016-09-22 MED ORDER — AZITHROMYCIN 250 MG PO TABS: ORAL_TABLET | ORAL | 0 refills | Status: DC

## 2016-09-22 NOTE — Progress Notes (Signed)
Patients o2 on room air sitting was 99%, walked patient around office on room air, o2 came down to 85%, patient states some SOB when walking, more SOB when stopping.

## 2016-09-22 NOTE — Progress Notes (Addendum)
  [facility] OFFICE PROGRESS NOTE   DIAGNOSIS: Stage IIIA (T2a, N2, M0) non-small cell lung cancer, adenocarcinoma presented with left suprahilar mass, mediastinal lymphadenopathy and suspicious pulmonary nodule in the left upper lobe diagnosed in November 2017. No actionable mutations on Guardant 360 testing.  PRIOR THERAPY: Status post concurrent chemoradiation with weekly carboplatin for AUC of 2 and paclitaxel 45 MG/M2 for 7 cycles. Last dose was given 06/13/2016.  CURRENT THERAPY: Immunotherapy with Imfinzi (Durvalumab) 10 MG/KG every 2 weeks. First dose 08/11/2016. Status post 3 cycles.   INTERVAL HISTORY:   Debbie Baker returns as scheduled. She completed cycle 3 Durvalumab 09/08/2016. She reports progressive dyspnea and cough over the past week. She had a single episode of blood streaked mucus 4 days ago. Cough otherwise has been nonproductive. No fever. No leg swelling or calf pain. No chest pain. She continues to have palpitations. The palpitations occur intermittently. She may have no palpitations throughout the course of the day or she may have up to 4-5 episodes. She is following up with cardiology.  She reports a history of pneumonitis following radiation. She states this feels different. She is concerned she has pneumonia.  She denies nausea/vomiting. No mouth sores. No diarrhea. No rash.  Objective:  Vital signs in last 24 hours:  Blood pressure 120/80, pulse 93, temperature 98.7 F (37.1 C), temperature source Oral, resp. rate 18, height 5' 7.5" (1.715 m), weight 196 lb 4.8 oz (89 kg), SpO2 (!) 85 %.   Pleasant female in no acute distress HEENT: No thrush or ulcers. Lymphatics: No palpable cervical or supraclavicular lymph nodes. Resp: Lungs clear bilaterally. No respiratory distress. Cardio: Regular rate and rhythm. No JVD. GI: Abdomen soft and nontender. No hepatomegaly. Vascular: No leg edema. Neuro: Alert and oriented.  Skin: No rash.    Lab  Results:  Lab Results  Component Value Date   WBC 4.1 09/22/2016   HGB 14.4 09/22/2016   HCT 42.1 09/22/2016   MCV 96.1 09/22/2016   PLT 236 09/22/2016   NEUTROABS 2.5 09/22/2016    Imaging:  No results found.  Medications: I have reviewed the patient's current medications.  Assessment/Plan: 1. Stage IIIa non-small cell lung cancer, adenocarcinoma status post concurrent chemoradiation with partial response; currently undergoing treatment with consolidation immunotherapy with Imfinzi (Durvalumab) now status post 3 cycles   Disposition: Debbie Baker has completed 3 cycles of Durvalumab. She presents today with a one-week history of progressive dyspnea and cough. Oxygen level decreased from 99% to 85% with ambulation. She is not ill appearing. We are referring her for a stat chest x-ray, question pneumonitis, question pneumonia. She will return to the office to review the results.  The chest x-ray shows a new abnormal posterior left lung opacity and left lung volume loss. Dr. Benay Spice and I reviewed the images. We are going to treat her for pneumonia. She appears stable for outpatient treatment. She is unable to take fluoroquinolones or penicillin. She will complete a Z-Pak. She understands to contact the office or seek emergency evaluation with any worsening of her symptoms or onset of fever.  Above reviewed with Dr. Benay Spice with his agreement. 30 minutes were spent face-to-face at today's visit with the majority of that time involved in counseling/coordination of care.  Ned Card ANP/GNP-BC   09/22/2016  9:46 AM

## 2016-09-23 ENCOUNTER — Telehealth: Payer: Self-pay

## 2016-09-23 NOTE — Telephone Encounter (Signed)
Called to follow up with patient this morning, she states she is feeling a little better but hasn't got up and moved around yet, she will call if her symptoms worsen or persist.

## 2016-09-26 ENCOUNTER — Telehealth: Payer: Self-pay | Admitting: *Deleted

## 2016-09-26 NOTE — Telephone Encounter (Signed)
Telephone call to check status on patient diagnosed with pneumonia last week. Patient states she has improved almost to 100%. She has been able to resume most activities of daily living. Her cough almost resolved and her dyspnea has improved. She reports she is about at her baseline. Pt understands to call this office if any concerns or questions arise prior to her next office visit.

## 2016-09-29 ENCOUNTER — Ambulatory Visit (INDEPENDENT_AMBULATORY_CARE_PROVIDER_SITE_OTHER): Payer: Managed Care, Other (non HMO)

## 2016-09-29 ENCOUNTER — Ambulatory Visit (HOSPITAL_COMMUNITY): Payer: Managed Care, Other (non HMO) | Attending: Cardiology

## 2016-09-29 ENCOUNTER — Other Ambulatory Visit: Payer: Self-pay

## 2016-09-29 DIAGNOSIS — I361 Nonrheumatic tricuspid (valve) insufficiency: Secondary | ICD-10-CM | POA: Diagnosis not present

## 2016-09-29 DIAGNOSIS — R002 Palpitations: Secondary | ICD-10-CM

## 2016-09-29 DIAGNOSIS — I5030 Unspecified diastolic (congestive) heart failure: Secondary | ICD-10-CM | POA: Diagnosis not present

## 2016-09-29 DIAGNOSIS — I348 Other nonrheumatic mitral valve disorders: Secondary | ICD-10-CM | POA: Insufficient documentation

## 2016-09-29 DIAGNOSIS — I313 Pericardial effusion (noninflammatory): Secondary | ICD-10-CM | POA: Insufficient documentation

## 2016-10-06 ENCOUNTER — Ambulatory Visit (HOSPITAL_BASED_OUTPATIENT_CLINIC_OR_DEPARTMENT_OTHER): Payer: Managed Care, Other (non HMO) | Admitting: Internal Medicine

## 2016-10-06 ENCOUNTER — Encounter: Payer: Self-pay | Admitting: Internal Medicine

## 2016-10-06 ENCOUNTER — Other Ambulatory Visit (HOSPITAL_BASED_OUTPATIENT_CLINIC_OR_DEPARTMENT_OTHER): Payer: Managed Care, Other (non HMO)

## 2016-10-06 ENCOUNTER — Telehealth: Payer: Self-pay | Admitting: Internal Medicine

## 2016-10-06 ENCOUNTER — Ambulatory Visit (HOSPITAL_BASED_OUTPATIENT_CLINIC_OR_DEPARTMENT_OTHER): Payer: Managed Care, Other (non HMO)

## 2016-10-06 VITALS — BP 105/83 | HR 82 | Temp 98.1°F | Resp 18 | Ht 67.5 in | Wt 197.6 lb

## 2016-10-06 DIAGNOSIS — C3492 Malignant neoplasm of unspecified part of left bronchus or lung: Secondary | ICD-10-CM

## 2016-10-06 DIAGNOSIS — C3412 Malignant neoplasm of upper lobe, left bronchus or lung: Secondary | ICD-10-CM | POA: Diagnosis not present

## 2016-10-06 DIAGNOSIS — Z79899 Other long term (current) drug therapy: Secondary | ICD-10-CM

## 2016-10-06 DIAGNOSIS — Z5112 Encounter for antineoplastic immunotherapy: Secondary | ICD-10-CM

## 2016-10-06 LAB — CBC WITH DIFFERENTIAL/PLATELET
BASO%: 0.9 % (ref 0.0–2.0)
Basophils Absolute: 0 10*3/uL (ref 0.0–0.1)
EOS%: 2.7 % (ref 0.0–7.0)
Eosinophils Absolute: 0.1 10*3/uL (ref 0.0–0.5)
HCT: 43.4 % (ref 34.8–46.6)
HGB: 14.8 g/dL (ref 11.6–15.9)
LYMPH%: 27.4 % (ref 14.0–49.7)
MCH: 32.2 pg (ref 25.1–34.0)
MCHC: 34.2 g/dL (ref 31.5–36.0)
MCV: 94.1 fL (ref 79.5–101.0)
MONO#: 0.4 10*3/uL (ref 0.1–0.9)
MONO%: 9.8 % (ref 0.0–14.0)
NEUT%: 59.2 % (ref 38.4–76.8)
NEUTROS ABS: 2.4 10*3/uL (ref 1.5–6.5)
Platelets: 192 10*3/uL (ref 145–400)
RBC: 4.61 10*6/uL (ref 3.70–5.45)
RDW: 12 % (ref 11.2–14.5)
WBC: 4.1 10*3/uL (ref 3.9–10.3)
lymph#: 1.1 10*3/uL (ref 0.9–3.3)

## 2016-10-06 LAB — COMPREHENSIVE METABOLIC PANEL
ALBUMIN: 3.8 g/dL (ref 3.5–5.0)
ALT: 41 U/L (ref 0–55)
AST: 34 U/L (ref 5–34)
Alkaline Phosphatase: 92 U/L (ref 40–150)
Anion Gap: 9 mEq/L (ref 3–11)
BILIRUBIN TOTAL: 0.47 mg/dL (ref 0.20–1.20)
BUN: 8.2 mg/dL (ref 7.0–26.0)
CO2: 24 mEq/L (ref 22–29)
Calcium: 9.5 mg/dL (ref 8.4–10.4)
Chloride: 106 mEq/L (ref 98–109)
Creatinine: 0.7 mg/dL (ref 0.6–1.1)
EGFR: >90 mL/min/{1.73_m2} (ref >90)
Glucose: 110 mg/dl (ref 70–140)
Potassium: 4 mEq/L (ref 3.5–5.1)
Sodium: 139 mEq/L (ref 136–145)
TOTAL PROTEIN: 7 g/dL (ref 6.4–8.3)

## 2016-10-06 LAB — TSH: TSH: 2.104 m(IU)/L (ref 0.308–3.960)

## 2016-10-06 MED ORDER — SODIUM CHLORIDE 0.9 % IV SOLN
Freq: Once | INTRAVENOUS | Status: AC
  Administered 2016-10-06: 09:00:00 via INTRAVENOUS

## 2016-10-06 MED ORDER — SODIUM CHLORIDE 0.9 % IV SOLN
10.0000 mg/kg | Freq: Once | INTRAVENOUS | Status: AC
  Administered 2016-10-06: 860 mg via INTRAVENOUS
  Filled 2016-10-06: qty 10

## 2016-10-06 NOTE — Telephone Encounter (Signed)
Appointments scheduled per 10/06/16 LOS. Patient given AVS report and calendars with future scheduled appointments.

## 2016-10-06 NOTE — Patient Instructions (Addendum)
[facility] Discharge Instructions for Patients Receiving Chemotherapy  Today you received the following chemotherapy agent: durvalumab (Imfinzi).  To help prevent nausea and vomiting after your treatment, we encourage you to take your nausea medication as directed .  If you develop nausea and vomiting that is not controlled by your nausea medication, call the clinic.   BELOW ARE SYMPTOMS THAT SHOULD BE REPORTED IMMEDIATELY:  *FEVER GREATER THAN 100.5 F  *CHILLS WITH OR WITHOUT FEVER  NAUSEA AND VOMITING THAT IS NOT CONTROLLED WITH YOUR NAUSEA MEDICATION  *UNUSUAL SHORTNESS OF BREATH  *UNUSUAL BRUISING OR BLEEDING  TENDERNESS IN MOUTH AND THROAT WITH OR WITHOUT PRESENCE OF ULCERS  *URINARY PROBLEMS  *BOWEL PROBLEMS  UNUSUAL RASH Items with * indicate a potential emergency and should be followed up as soon as possible.  Feel free to call the clinic you have any questions or concerns. The clinic phone number is [phone].

## 2016-10-06 NOTE — Progress Notes (Signed)
[facility] [phone]   [phone]  OFFICE PROGRESS NOTE  Debbie Fess, MD [place] [address]  DIAGNOSIS: Stage IIIA (T2a, N2, M0) non-small cell lung cancer, adenocarcinoma presented with left suprahilar mass, mediastinal lymphadenopathy and suspicious pulmonary nodule in the left upper lobe diagnosed in November 2017. No actionable mutations on Guardant 360 testing.  PRIOR THERAPY: Status post concurrent chemoradiation with weekly carboplatin for AUC of 2 and paclitaxel 45 MG/M2 for 7 cycles. Last dose was given 06/13/2016.  CURRENT THERAPY: Immunotherapy with Imfinzi (Durvalumab) 10 MG/KG every 2 weeks. First dose 08/11/2016. Status post 3 cycles.  INTERVAL HISTORY: Debbie Baker 51 y.o. female returns to the clinic today for follow-up visit. The patient is feeling much better today. She has been off treatment for the last few weeks because of questionable pneumonia. She was treated with Z-Pak and felt much better. She denied having any chest pain, shortness of breath continues to have intermittent cough with no hemoptysis. She was seen by cardiology for tachycardia and currently on cardiac monitor. She denied having any weight loss or night sweats. She has no nausea or vomiting. She has no fever or chills. She is here today for evaluation before starting cycle #4.  MEDICAL HISTORY: Past Medical History:  Diagnosis Date  . Allergic rhinitis   . Deaf, left   . Diverticulosis   . Dyspnea   . Encounter for antineoplastic chemotherapy 04/18/2016  . Encounter for antineoplastic immunotherapy 07/21/2016  . GERD (gastroesophageal reflux disease)   . Hyperlipidemia   . Lung cancer ([facility])    non small cell lung cancer favoring adenocarcinoma   . Lung nodules   . Migraine   . Odynophagia 05/24/2016  . Pneumonia   . PONV (postoperative nausea and vomiting)   . Tachycardia, paroxysmal ([facility]) 09/08/2016  . Wears glasses      ALLERGIES:  is allergic to penicillins; chantix [varenicline]; and levaquin [levofloxacin].  MEDICATIONS:  Current Outpatient Prescriptions  Medication Sig Dispense Refill  . albuterol (PROVENTIL HFA;VENTOLIN HFA) 108 (90 Base) MCG/ACT inhaler Inhale 2 puffs into the lungs every 6 (six) hours as needed for wheezing or shortness of breath. 1 Inhaler 0  . azithromycin (ZITHROMAX Z-PAK) 250 MG tablet Take as directed 6 each 0  . benzonatate (TESSALON) 100 MG capsule Take 1 capsule (100 mg total) by mouth every 4 (four) hours as needed for cough. 60 capsule 1  . Calcium-Magnesium-Vitamin D 185-50-100 MG-MG-UNIT CAPS Take 1 tablet by mouth every evening.    . cetirizine (ZYRTEC) 10 MG tablet Take 10 mg by mouth daily.    . diphenhydrAMINE (BENADRYL) 25 mg capsule Take 25 mg by mouth at bedtime as needed.    . durvalumab (IMFINZI) 120 MG/2.4ML SOLN injection Inject 120 mg into the vein once a week.    Marland Kitchen ELDERBERRY PO Take 2 tablets by mouth daily. Chewable Supplement    . Green Tea, Camillia sinensis, (GREEN TEA EXTRACT PO) Take by mouth.    . Lactobacillus (PROBIOTIC ACIDOPHILUS) CAPS Take by mouth.    . MELATONIN GUMMIES PO Take by mouth.    . Milk Thistle 1000 MG CAPS Take 1 capsule by mouth daily.    . Misc Natural Products (BLACK COHOSH MENOPAUSE COMPLEX) TABS Take 1 capsule by mouth 2 (two) times daily.    . Misc Natural Products (TURMERIC CURCUMIN) CAPS Take 1 capsule by mouth every evening. 1500 mg capsule    . Multiple Vitamins-Minerals (  WOMENS 50+ MULTI VITAMIN/MIN) TABS Take 1 tablet by mouth daily.    . naproxen sodium (ANAPROX) 220 MG tablet Take 220-440 mg by mouth 2 (two) times daily as needed (for pain).     Marland Kitchen OVER THE COUNTER MEDICATION Take 250 mg by mouth daily. Jiaogulan Supplement    . pantoprazole (PROTONIX) 40 MG tablet Take 1 tablet (40 mg total) by mouth every evening. 90 tablet 3  . pravastatin (PRAVACHOL) 20 MG tablet Take 20 mg by mouth every evening.  3  .  prochlorperazine (COMPAZINE) 10 MG tablet Take 1 tablet (10 mg total) by mouth every 6 (six) hours as needed for nausea or vomiting. 30 tablet 0  . ranitidine (ZANTAC) 150 MG tablet Take 150-300 mg by mouth every evening. 300 mg with more acidic meals    . sucralfate (CARAFATE) 1 g tablet Take 1 tablet (1 g total) by mouth 4 (four) times daily -  with meals and at bedtime. 5 min before meals for radiation induced esophagitis 120 tablet 2   No current facility-administered medications for this visit.     SURGICAL HISTORY:  Past Surgical History:  Procedure Laterality Date  . ABDOMINAL HYSTERECTOMY    . VIDEO BRONCHOSCOPY Bilateral 07/03/2015   Procedure: VIDEO BRONCHOSCOPY WITH FLUORO;  Surgeon: Marshell Garfinkel, MD;  Location: [facility];  Service: Cardiopulmonary;  Laterality: Bilateral;  . VIDEO BRONCHOSCOPY WITH ENDOBRONCHIAL ULTRASOUND N/A 04/06/2016   Procedure: VIDEO BRONCHOSCOPY WITH ENDOBRONCHIAL ULTRASOUND;  Surgeon: Collene Gobble, MD;  Location: [facility];  Service: Thoracic;  Laterality: N/A;  . WRIST FRACTURE SURGERY      REVIEW OF SYSTEMS:  A comprehensive review of systems was negative except for: Respiratory: positive for cough   PHYSICAL EXAMINATION: General appearance: alert, cooperative and no distress Head: Normocephalic, without obvious abnormality, atraumatic Neck: no adenopathy, no JVD, supple, symmetrical, trachea midline and thyroid not enlarged, symmetric, no tenderness/mass/nodules Lymph nodes: Cervical, supraclavicular, and axillary nodes normal. Resp: clear to auscultation bilaterally Back: symmetric, no curvature. ROM normal. No CVA tenderness. Cardio: regular rate and rhythm, S1, S2 normal, no murmur, click, rub or gallop GI: soft, non-tender; bowel sounds normal; no masses,  no organomegaly Extremities: extremities normal, atraumatic, no cyanosis or edema  ECOG PERFORMANCE STATUS: 1 - Symptomatic but completely ambulatory  Blood pressure 105/83, pulse 82,  temperature 98.1 F (36.7 C), temperature source Oral, resp. rate 18, height 5' 7.5" (1.715 m), weight 197 lb 9.6 oz (89.6 kg), SpO2 96 %.  LABORATORY DATA: Lab Results  Component Value Date   WBC 4.1 09/22/2016   HGB 14.4 09/22/2016   HCT 42.1 09/22/2016   MCV 96.1 09/22/2016   PLT 236 09/22/2016      Chemistry      Component Value Date/Time   NA 143 09/22/2016 0836   K 4.0 09/22/2016 0836   CL 107 04/06/2016 0742   CO2 25 09/22/2016 0836   BUN 7.8 09/22/2016 0836   CREATININE 0.8 09/22/2016 0836      Component Value Date/Time   CALCIUM 9.5 09/22/2016 0836   ALKPHOS 97 09/22/2016 0836   AST 20 09/22/2016 0836   ALT 19 09/22/2016 0836   BILITOT 0.40 09/22/2016 0836       RADIOGRAPHIC STUDIES: Dg Chest 2 View  Result Date: 09/22/2016 CLINICAL DATA:  51 year old female with of stage III left-sided lung adenocarcinoma status post radiation therapy and chemotherapy completed earlier this year. Progressive shortness of breath and cough for 1 week. EXAM: CHEST  2 VIEW COMPARISON:  Restaging chest CT 07/18/2016 and earlier. FINDINGS: 5 cm area of residual left perihilar masslike opacity and spiculation with mild associated bronchiectasis again noted. However, there is increased abnormal posterior left lung opacity when compared to the February CT (lateral view). Associated increased volume loss in the left lung with tenting of the left hemidiaphragm now. No definite pleural effusion. Mediastinal contours are stable. Visualized tracheal air column is within normal limits. No pneumothorax or pulmonary edema. No acute osseous abnormality identified. Negative visible bowel gas pattern. IMPRESSION: 1. New abnormal posterior left lung opacity, an left lung volume loss, since the restaging chest CT on 07/18/2016. Repeat Chest CT with IV contrast would best evaluate further. 2. Post treatment changes about the left hilum. Electronically Signed   By: Genevie Ann M.D.   On: 09/22/2016 10:00     ASSESSMENT AND PLAN:  This is a very pleasant 51 years old white female with history of stage IIIa non-small cell lung cancer, adenocarcinoma status post a course of concurrent chemoradiation with partial response. She is currently undergoing consolidation immunotherapy with Imfinzi (Durvalumab) status post 3 cycles. She has been tolerating her treatment well. I recommended for her to resume her treatment with cycle #4 today. I will see her back for follow-up visit in 2 weeks for reevaluation before the next cycle of her treatment. She was advised to call immediately if she has any concerning symptoms in the interval. The patient voices understanding of current disease status and treatment options and is in agreement with the current care plan. All questions were answered. The patient knows to call the clinic with any problems, questions or concerns. We can certainly see the patient much sooner if necessary. I spent 10 minutes counseling the patient face to face. The total time spent in the appointment was 15 minutes. Disclaimer: This note was dictated with voice recognition software. Similar sounding words can inadvertently be transcribed and may not be corrected upon review.

## 2016-10-20 ENCOUNTER — Encounter: Payer: Self-pay | Admitting: Internal Medicine

## 2016-10-20 ENCOUNTER — Ambulatory Visit (HOSPITAL_BASED_OUTPATIENT_CLINIC_OR_DEPARTMENT_OTHER): Payer: Managed Care, Other (non HMO)

## 2016-10-20 ENCOUNTER — Telehealth: Payer: Self-pay | Admitting: Internal Medicine

## 2016-10-20 ENCOUNTER — Other Ambulatory Visit (HOSPITAL_BASED_OUTPATIENT_CLINIC_OR_DEPARTMENT_OTHER): Payer: Managed Care, Other (non HMO)

## 2016-10-20 ENCOUNTER — Ambulatory Visit (HOSPITAL_BASED_OUTPATIENT_CLINIC_OR_DEPARTMENT_OTHER): Payer: Managed Care, Other (non HMO) | Admitting: Internal Medicine

## 2016-10-20 VITALS — BP 113/85 | HR 89 | Temp 98.9°F | Resp 18 | Ht 67.5 in | Wt 194.5 lb

## 2016-10-20 DIAGNOSIS — Z5112 Encounter for antineoplastic immunotherapy: Secondary | ICD-10-CM | POA: Diagnosis not present

## 2016-10-20 DIAGNOSIS — C3412 Malignant neoplasm of upper lobe, left bronchus or lung: Secondary | ICD-10-CM | POA: Diagnosis not present

## 2016-10-20 DIAGNOSIS — C3492 Malignant neoplasm of unspecified part of left bronchus or lung: Secondary | ICD-10-CM

## 2016-10-20 LAB — COMPREHENSIVE METABOLIC PANEL
ALBUMIN: 4 g/dL (ref 3.5–5.0)
ALK PHOS: 96 U/L (ref 40–150)
ALT: 27 U/L (ref 0–55)
AST: 24 U/L (ref 5–34)
Anion Gap: 12 mEq/L — ABNORMAL HIGH (ref 3–11)
BUN: 7.5 mg/dL (ref 7.0–26.0)
CALCIUM: 9.7 mg/dL (ref 8.4–10.4)
CO2: 24 mEq/L (ref 22–29)
CREATININE: 0.8 mg/dL (ref 0.6–1.1)
Chloride: 105 mEq/L (ref 98–109)
EGFR: 87 mL/min/{1.73_m2} — ABNORMAL LOW (ref >90)
Glucose: 88 mg/dl (ref 70–140)
Potassium: 3.8 mEq/L (ref 3.5–5.1)
Sodium: 141 mEq/L (ref 136–145)
Total Bilirubin: 0.51 mg/dL (ref 0.20–1.20)
Total Protein: 7.1 g/dL (ref 6.4–8.3)

## 2016-10-20 LAB — CBC WITH DIFFERENTIAL/PLATELET
BASO%: 1 % (ref 0.0–2.0)
BASOS ABS: 0 10*3/uL (ref 0.0–0.1)
EOS%: 2.5 % (ref 0.0–7.0)
Eosinophils Absolute: 0.1 10*3/uL (ref 0.0–0.5)
HEMATOCRIT: 44.6 % (ref 34.8–46.6)
HEMOGLOBIN: 15.1 g/dL (ref 11.6–15.9)
LYMPH#: 1.2 10*3/uL (ref 0.9–3.3)
LYMPH%: 28.8 % (ref 14.0–49.7)
MCH: 32 pg (ref 25.1–34.0)
MCHC: 33.9 g/dL (ref 31.5–36.0)
MCV: 94.5 fL (ref 79.5–101.0)
MONO#: 0.4 10*3/uL (ref 0.1–0.9)
MONO%: 10.9 % (ref 0.0–14.0)
NEUT#: 2.3 10*3/uL (ref 1.5–6.5)
NEUT%: 56.8 % (ref 38.4–76.8)
Platelets: 221 10*3/uL (ref 145–400)
RBC: 4.72 10*6/uL (ref 3.70–5.45)
RDW: 12.2 % (ref 11.2–14.5)
WBC: 4 10*3/uL (ref 3.9–10.3)

## 2016-10-20 MED ORDER — SODIUM CHLORIDE 0.9 % IV SOLN
10.0000 mg/kg | Freq: Once | INTRAVENOUS | Status: AC
  Administered 2016-10-20: 860 mg via INTRAVENOUS
  Filled 2016-10-20: qty 10

## 2016-10-20 MED ORDER — SODIUM CHLORIDE 0.9 % IV SOLN
Freq: Once | INTRAVENOUS | Status: AC
  Administered 2016-10-20: 10:00:00 via INTRAVENOUS

## 2016-10-20 NOTE — Telephone Encounter (Signed)
No additional appointments required at this time. Patient has appointments scheduled throughout July. Patient was given a copy of the AVS report and appointment schedule, per 10/20/16 los.

## 2016-10-20 NOTE — Progress Notes (Signed)
[facility] Telephone:([phone]) [phone]   [phone]) [phone]  OFFICE PROGRESS NOTE  Hulan Fess, MD [place] [address]  DIAGNOSIS: Stage IIIA (T2a, N2, M0) non-small cell lung cancer, adenocarcinoma presented with left suprahilar mass, mediastinal lymphadenopathy and suspicious pulmonary nodule in the left upper lobe diagnosed in November 2017. No actionable mutations on Guardant 360 testing.  PRIOR THERAPY: Status post concurrent chemoradiation with weekly carboplatin for AUC of 2 and paclitaxel 45 MG/M2 for 7 cycles. Last dose was given 06/13/2016.  CURRENT THERAPY: Immunotherapy with Imfinzi (Durvalumab) 10 MG/KG every 2 weeks. First dose 08/11/2016. Status post 4 cycles.  INTERVAL HISTORY: Debbie Baker 51 y.o. female returns to the clinic today for follow-up visit. The patient is currently on treatment with immunotherapy with Imfinzi (Durvalumab) status post 4 cycles and has been tolerating her treatment well. She continues to have dry cough. She denied having any chest pain, shortness of breath or hemoptysis. She denied having any weight loss or night sweats. She has no nausea, vomiting, diarrhea or constipation. She denied having any skin rash. She is here today for evaluation before starting cycle #5.   MEDICAL HISTORY: Past Medical History:  Diagnosis Date  . Allergic rhinitis   . Deaf, left   . Diverticulosis   . Dyspnea   . Encounter for antineoplastic chemotherapy 04/18/2016  . Encounter for antineoplastic immunotherapy 07/21/2016  . GERD (gastroesophageal reflux disease)   . Hyperlipidemia   . Lung cancer ([facility])    non small cell lung cancer favoring adenocarcinoma   . Lung nodules   . Migraine   . Odynophagia 05/24/2016  . Pneumonia   . PONV (postoperative nausea and vomiting)   . Tachycardia, paroxysmal ([facility]) 09/08/2016  . Wears glasses     ALLERGIES:  is allergic to penicillins; chantix [varenicline]; and levaquin  [levofloxacin].  MEDICATIONS:  Current Outpatient Prescriptions  Medication Sig Dispense Refill  . albuterol (PROVENTIL HFA;VENTOLIN HFA) 108 (90 Base) MCG/ACT inhaler Inhale 2 puffs into the lungs every 6 (six) hours as needed for wheezing or shortness of breath. 1 Inhaler 0  . azithromycin (ZITHROMAX Z-PAK) 250 MG tablet Take as directed 6 each 0  . benzonatate (TESSALON) 100 MG capsule Take 1 capsule (100 mg total) by mouth every 4 (four) hours as needed for cough. 60 capsule 1  . Calcium-Magnesium-Vitamin D 185-50-100 MG-MG-UNIT CAPS Take 1 tablet by mouth every evening.    . cetirizine (ZYRTEC) 10 MG tablet Take 10 mg by mouth daily.    . diphenhydrAMINE (BENADRYL) 25 mg capsule Take 25 mg by mouth at bedtime as needed.    . durvalumab (IMFINZI) 120 MG/2.4ML SOLN injection Inject 120 mg into the vein once a week.    Marland Kitchen ELDERBERRY PO Take 2 tablets by mouth daily. Chewable Supplement    . Green Tea, Camillia sinensis, (GREEN TEA EXTRACT PO) Take by mouth.    . Lactobacillus (PROBIOTIC ACIDOPHILUS) CAPS Take by mouth.    . MELATONIN GUMMIES PO Take by mouth.    . Milk Thistle 1000 MG CAPS Take 1 capsule by mouth daily.    . Misc Natural Products (BLACK COHOSH MENOPAUSE COMPLEX) TABS Take 1 capsule by mouth 2 (two) times daily.    . Misc Natural Products (TURMERIC CURCUMIN) CAPS Take 1 capsule by mouth every evening. 1500 mg capsule    . Multiple Vitamins-Minerals (WOMENS 50+ MULTI VITAMIN/MIN) TABS Take 1 tablet by mouth daily.    . naproxen sodium (  ANAPROX) 220 MG tablet Take 220-440 mg by mouth 2 (two) times daily as needed (for pain).     Marland Kitchen OVER THE COUNTER MEDICATION Take 250 mg by mouth daily. Jiaogulan Supplement    . pantoprazole (PROTONIX) 40 MG tablet Take 1 tablet (40 mg total) by mouth every evening. 90 tablet 3  . pravastatin (PRAVACHOL) 20 MG tablet Take 20 mg by mouth every evening.  3  . prochlorperazine (COMPAZINE) 10 MG tablet Take 1 tablet (10 mg total) by mouth every 6  (six) hours as needed for nausea or vomiting. 30 tablet 0  . ranitidine (ZANTAC) 150 MG tablet Take 150-300 mg by mouth every evening. 300 mg with more acidic meals    . sucralfate (CARAFATE) 1 g tablet Take 1 tablet (1 g total) by mouth 4 (four) times daily -  with meals and at bedtime. 5 min before meals for radiation induced esophagitis 120 tablet 2   No current facility-administered medications for this visit.     SURGICAL HISTORY:  Past Surgical History:  Procedure Laterality Date  . ABDOMINAL HYSTERECTOMY    . VIDEO BRONCHOSCOPY Bilateral 07/03/2015   Procedure: VIDEO BRONCHOSCOPY WITH FLUORO;  Surgeon: Marshell Garfinkel, MD;  Location: [facility];  Service: Cardiopulmonary;  Laterality: Bilateral;  . VIDEO BRONCHOSCOPY WITH ENDOBRONCHIAL ULTRASOUND N/A 04/06/2016   Procedure: VIDEO BRONCHOSCOPY WITH ENDOBRONCHIAL ULTRASOUND;  Surgeon: Collene Gobble, MD;  Location: [facility];  Service: Thoracic;  Laterality: N/A;  . WRIST FRACTURE SURGERY      REVIEW OF SYSTEMS:  A comprehensive review of systems was negative except for: Respiratory: positive for cough   PHYSICAL EXAMINATION: General appearance: alert, cooperative and no distress Head: Normocephalic, without obvious abnormality, atraumatic Neck: no adenopathy, no JVD, supple, symmetrical, trachea midline and thyroid not enlarged, symmetric, no tenderness/mass/nodules Lymph nodes: Cervical, supraclavicular, and axillary nodes normal. Resp: clear to auscultation bilaterally Back: symmetric, no curvature. ROM normal. No CVA tenderness. Cardio: regular rate and rhythm, S1, S2 normal, no murmur, click, rub or gallop GI: soft, non-tender; bowel sounds normal; no masses,  no organomegaly Extremities: extremities normal, atraumatic, no cyanosis or edema  ECOG PERFORMANCE STATUS: 1 - Symptomatic but completely ambulatory  Blood pressure 113/85, pulse 89, temperature 98.9 F (37.2 C), temperature source Oral, resp. rate 18, height 5' 7.5"  (1.715 m), weight 194 lb 8 oz (88.2 kg), SpO2 96 %.  LABORATORY DATA: Lab Results  Component Value Date   WBC 4.0 10/20/2016   HGB 15.1 10/20/2016   HCT 44.6 10/20/2016   MCV 94.5 10/20/2016   PLT 221 10/20/2016      Chemistry      Component Value Date/Time   NA 139 10/06/2016 0810   K 4.0 10/06/2016 0810   CL 107 04/06/2016 0742   CO2 24 10/06/2016 0810   BUN 8.2 10/06/2016 0810   CREATININE 0.7 10/06/2016 0810      Component Value Date/Time   CALCIUM 9.5 10/06/2016 0810   ALKPHOS 92 10/06/2016 0810   AST 34 10/06/2016 0810   ALT 41 10/06/2016 0810   BILITOT 0.47 10/06/2016 0810       RADIOGRAPHIC STUDIES: Dg Chest 2 View  Result Date: 09/22/2016 CLINICAL DATA:  51 year old female with of stage III left-sided lung adenocarcinoma status post radiation therapy and chemotherapy completed earlier this year. Progressive shortness of breath and cough for 1 week. EXAM: CHEST  2 VIEW COMPARISON:  Restaging chest CT 07/18/2016 and earlier. FINDINGS: 5 cm area of residual left perihilar masslike opacity  and spiculation with mild associated bronchiectasis again noted. However, there is increased abnormal posterior left lung opacity when compared to the February CT (lateral view). Associated increased volume loss in the left lung with tenting of the left hemidiaphragm now. No definite pleural effusion. Mediastinal contours are stable. Visualized tracheal air column is within normal limits. No pneumothorax or pulmonary edema. No acute osseous abnormality identified. Negative visible bowel gas pattern. IMPRESSION: 1. New abnormal posterior left lung opacity, an left lung volume loss, since the restaging chest CT on 07/18/2016. Repeat Chest CT with IV contrast would best evaluate further. 2. Post treatment changes about the left hilum. Electronically Signed   By: Genevie Ann M.D.   On: 09/22/2016 10:00    ASSESSMENT AND PLAN:  This is a very pleasant 51 years old white female with stage IIIa  non-small cell lung cancer, adenocarcinoma status post a course of concurrent chemoradiation with partial response. She is currently undergoing consolidation treatment with immunotherapy with Imfinzi (Durvalumab) status post 4 cycles and has been tolerating her treatment well. I recommended for the patient to continue cycle #5 today as a scheduled. I will see her back for follow-up visit in 2 weeks for reevaluation before starting cycle #6. For the dry cough, the patient will start over the counter Mucinex. She was advised to call immediately she has any concerning symptoms in the interval. The patient voices understanding of current disease status and treatment options and is in agreement with the current care plan. All questions were answered. The patient knows to call the clinic with any problems, questions or concerns. We can certainly see the patient much sooner if necessary. I spent 10 minutes counseling the patient face to face. The total time spent in the appointment was 15 minutes. Disclaimer: This note was dictated with voice recognition software. Similar sounding words can inadvertently be transcribed and may not be corrected upon review.

## 2016-10-20 NOTE — Patient Instructions (Signed)
[facility] Discharge Instructions for Patients Receiving Chemotherapy  Today you received the following chemotherapy agents:  Imfinzi.  To help prevent nausea and vomiting after your treatment, we encourage you to take your nausea medication as directed.   If you develop nausea and vomiting that is not controlled by your nausea medication, call the clinic.   BELOW ARE SYMPTOMS THAT SHOULD BE REPORTED IMMEDIATELY:  *FEVER GREATER THAN 100.5 F  *CHILLS WITH OR WITHOUT FEVER  NAUSEA AND VOMITING THAT IS NOT CONTROLLED WITH YOUR NAUSEA MEDICATION  *UNUSUAL SHORTNESS OF BREATH  *UNUSUAL BRUISING OR BLEEDING  TENDERNESS IN MOUTH AND THROAT WITH OR WITHOUT PRESENCE OF ULCERS  *URINARY PROBLEMS  *BOWEL PROBLEMS  UNUSUAL RASH Items with * indicate a potential emergency and should be followed up as soon as possible.  Feel free to call the clinic you have any questions or concerns. The clinic phone number is [phone].  Please show the [facility] at check-in to the Emergency Department and triage nurse.

## 2016-10-20 NOTE — Telephone Encounter (Signed)
Patient has appointments scheduled throughout August.

## 2016-10-28 ENCOUNTER — Ambulatory Visit (INDEPENDENT_AMBULATORY_CARE_PROVIDER_SITE_OTHER): Payer: Managed Care, Other (non HMO) | Admitting: Cardiovascular Disease

## 2016-10-28 ENCOUNTER — Encounter: Payer: Self-pay | Admitting: Cardiovascular Disease

## 2016-10-28 DIAGNOSIS — R002 Palpitations: Secondary | ICD-10-CM | POA: Diagnosis not present

## 2016-10-28 NOTE — Assessment & Plan Note (Signed)
Debbie Baker is a for follow-up of her outpatient tests performed in the evaluation of palpitations. Her 2-D echo was entirely normal. Her event Monitor showed sinus rhythm/sinus tachycardia. She has cut out her energy drinks and caffeine. She really no longer feels palpitations. I have reassured her and we'll see her back when necessary.

## 2016-10-28 NOTE — Progress Notes (Signed)
Debbie Baker is a for follow-up of her outpatient tests performed in the evaluation of palpitations. Her 2-D echo was entirely normal. Her event Monitor showed sinus rhythm/sinus tachycardia. She has cut out her energy drinks and caffeine. She really no longer feels palpitations. I have reassured her and we'll see her back when necessary.

## 2016-10-28 NOTE — Patient Instructions (Signed)
Medication Instructions: Your physician recommends that you continue on your current medications as directed. Please refer to the Current Medication list given to you today.   Follow-Up: Your physician recommends that you schedule a follow-up appointment as needed with Dr. Berry.    

## 2016-11-03 ENCOUNTER — Ambulatory Visit (HOSPITAL_BASED_OUTPATIENT_CLINIC_OR_DEPARTMENT_OTHER): Payer: Managed Care, Other (non HMO)

## 2016-11-03 ENCOUNTER — Other Ambulatory Visit (HOSPITAL_BASED_OUTPATIENT_CLINIC_OR_DEPARTMENT_OTHER): Payer: Managed Care, Other (non HMO)

## 2016-11-03 ENCOUNTER — Telehealth: Payer: Self-pay | Admitting: Internal Medicine

## 2016-11-03 ENCOUNTER — Ambulatory Visit (HOSPITAL_BASED_OUTPATIENT_CLINIC_OR_DEPARTMENT_OTHER): Payer: Managed Care, Other (non HMO) | Admitting: Internal Medicine

## 2016-11-03 ENCOUNTER — Other Ambulatory Visit: Payer: Self-pay | Admitting: Internal Medicine

## 2016-11-03 ENCOUNTER — Encounter: Payer: Self-pay | Admitting: Internal Medicine

## 2016-11-03 VITALS — BP 113/92 | HR 98 | Temp 98.6°F | Resp 19 | Ht 67.5 in | Wt 192.6 lb

## 2016-11-03 DIAGNOSIS — Z79899 Other long term (current) drug therapy: Secondary | ICD-10-CM

## 2016-11-03 DIAGNOSIS — C3492 Malignant neoplasm of unspecified part of left bronchus or lung: Secondary | ICD-10-CM

## 2016-11-03 DIAGNOSIS — R000 Tachycardia, unspecified: Secondary | ICD-10-CM | POA: Diagnosis not present

## 2016-11-03 DIAGNOSIS — Z5112 Encounter for antineoplastic immunotherapy: Secondary | ICD-10-CM

## 2016-11-03 DIAGNOSIS — C3412 Malignant neoplasm of upper lobe, left bronchus or lung: Secondary | ICD-10-CM

## 2016-11-03 LAB — CBC WITH DIFFERENTIAL/PLATELET
BASO%: 0.9 % (ref 0.0–2.0)
BASOS ABS: 0 10*3/uL (ref 0.0–0.1)
EOS ABS: 0.1 10*3/uL (ref 0.0–0.5)
EOS%: 2.6 % (ref 0.0–7.0)
HEMATOCRIT: 46.6 % (ref 34.8–46.6)
HGB: 15.8 g/dL (ref 11.6–15.9)
LYMPH%: 29.4 % (ref 14.0–49.7)
MCH: 31.6 pg (ref 25.1–34.0)
MCHC: 34 g/dL (ref 31.5–36.0)
MCV: 93 fL (ref 79.5–101.0)
MONO#: 0.4 10*3/uL (ref 0.1–0.9)
MONO%: 9.7 % (ref 0.0–14.0)
NEUT%: 57.4 % (ref 38.4–76.8)
NEUTROS ABS: 2.5 10*3/uL (ref 1.5–6.5)
Platelets: 223 10*3/uL (ref 145–400)
RBC: 5.01 10*6/uL (ref 3.70–5.45)
RDW: 12.3 % (ref 11.2–14.5)
WBC: 4.4 10*3/uL (ref 3.9–10.3)
lymph#: 1.3 10*3/uL (ref 0.9–3.3)

## 2016-11-03 LAB — COMPREHENSIVE METABOLIC PANEL
ALK PHOS: 90 U/L (ref 40–150)
ALT: 30 U/L (ref 0–55)
AST: 27 U/L (ref 5–34)
Albumin: 4.4 g/dL (ref 3.5–5.0)
Anion Gap: 13 mEq/L — ABNORMAL HIGH (ref 3–11)
BILIRUBIN TOTAL: 0.76 mg/dL (ref 0.20–1.20)
BUN: 9.7 mg/dL (ref 7.0–26.0)
CALCIUM: 10.2 mg/dL (ref 8.4–10.4)
CO2: 24 mEq/L (ref 22–29)
CREATININE: 0.8 mg/dL (ref 0.6–1.1)
Chloride: 104 mEq/L (ref 98–109)
EGFR: 87 mL/min/{1.73_m2} — ABNORMAL LOW (ref >90)
Glucose: 84 mg/dl (ref 70–140)
POTASSIUM: 3.8 meq/L (ref 3.5–5.1)
Sodium: 142 mEq/L (ref 136–145)
TOTAL PROTEIN: 7.8 g/dL (ref 6.4–8.3)

## 2016-11-03 LAB — TSH: TSH: 2.233 m(IU)/L (ref 0.308–3.960)

## 2016-11-03 MED ORDER — SODIUM CHLORIDE 0.9 % IV SOLN
Freq: Once | INTRAVENOUS | Status: AC
  Administered 2016-11-03: 12:00:00 via INTRAVENOUS

## 2016-11-03 MED ORDER — DURVALUMAB 500 MG/10ML IV SOLN
10.0000 mg/kg | Freq: Once | INTRAVENOUS | Status: AC
  Administered 2016-11-03: 860 mg via INTRAVENOUS
  Filled 2016-11-03: qty 7.2

## 2016-11-03 NOTE — Telephone Encounter (Signed)
Per 6/7 los - no additional appts added - patient already has 3 cycles . [facility] to contact patient with ct schedule.

## 2016-11-03 NOTE — Patient Instructions (Signed)
[facility] Discharge Instructions for Patients Receiving Chemotherapy  Today you received the following chemotherapy agents:  Imfinzi (durvalumab)  To help prevent nausea and vomiting after your treatment, we encourage you to take your nausea medication as prescribed.   If you develop nausea and vomiting that is not controlled by your nausea medication, call the clinic.   BELOW ARE SYMPTOMS THAT SHOULD BE REPORTED IMMEDIATELY:  *FEVER GREATER THAN 100.5 F  *CHILLS WITH OR WITHOUT FEVER  NAUSEA AND VOMITING THAT IS NOT CONTROLLED WITH YOUR NAUSEA MEDICATION  *UNUSUAL SHORTNESS OF BREATH  *UNUSUAL BRUISING OR BLEEDING  TENDERNESS IN MOUTH AND THROAT WITH OR WITHOUT PRESENCE OF ULCERS  *URINARY PROBLEMS  *BOWEL PROBLEMS  UNUSUAL RASH Items with * indicate a potential emergency and should be followed up as soon as possible.  Feel free to call the clinic you have any questions or concerns. The clinic phone number is [phone].  Please show the [facility] at check-in to the Emergency Department and triage nurse.

## 2016-11-03 NOTE — Progress Notes (Signed)
[facility] Telephone:([phone]   Fax:(336) [phone]  OFFICE PROGRESS NOTE  Hulan Fess, MD [place] [address]  DIAGNOSIS: Stage IIIA (T2a, N2, M0) non-small cell lung cancer, adenocarcinoma presented with left suprahilar mass, mediastinal lymphadenopathy and suspicious pulmonary nodule in the left upper lobe diagnosed in November 2017. No actionable mutations on [employer] 360 testing.  PRIOR THERAPY: Status post concurrent chemoradiation with weekly carboplatin for AUC of 2 and paclitaxel 45 MG/M2 for 7 cycles. Last dose was given 06/13/2016.  CURRENT THERAPY: Immunotherapy with Imfinzi (Durvalumab) 10 MG/KG every 2 weeks. First dose 08/11/2016. Status post 5 cycles.  INTERVAL HISTORY: Debbie Baker 51 y.o. female returns to the clinic today for follow-up visit. The patient has been tolerating her treatment with Imfinzi (Durvalumab) fairly well with no significant adverse effects. She denied having any chest pain, shortness of breath or hemoptysis. She had mild cough earlier today. She denied having any weight loss or night sweats. She has no nausea, vomiting, diarrhea or constipation. She denied having any skin rash. She is here today for evaluation before starting cycle #6.  MEDICAL HISTORY: Past Medical History:  Diagnosis Date  . Allergic rhinitis   . Deaf, left   . Diverticulosis   . Dyspnea   . Encounter for antineoplastic chemotherapy 04/18/2016  . Encounter for antineoplastic immunotherapy 07/21/2016  . GERD (gastroesophageal reflux disease)   . Hyperlipidemia   . Lung cancer ([facility])    non small cell lung cancer favoring adenocarcinoma   . Lung nodules   . Migraine   . Odynophagia 05/24/2016  . Pneumonia   . PONV (postoperative nausea and vomiting)   . Tachycardia, paroxysmal ([facility]) 09/08/2016  . Wears glasses     ALLERGIES:  is allergic to penicillins; chantix [varenicline]; and levaquin [levofloxacin].  MEDICATIONS:    Current Outpatient Prescriptions  Medication Sig Dispense Refill  . albuterol (PROVENTIL HFA;VENTOLIN HFA) 108 (90 Base) MCG/ACT inhaler Inhale 2 puffs into the lungs every 6 (six) hours as needed for wheezing or shortness of breath. 1 Inhaler 0  . benzonatate (TESSALON) 100 MG capsule Take 1 capsule (100 mg total) by mouth every 4 (four) hours as needed for cough. 60 capsule 1  . Calcium-Magnesium-Vitamin D 185-50-100 MG-MG-UNIT CAPS Take 1 tablet by mouth every evening.    . cetirizine (ZYRTEC) 10 MG tablet Take 10 mg by mouth daily.    . diphenhydrAMINE (BENADRYL) 25 mg capsule Take 25 mg by mouth at bedtime as needed.    . durvalumab (IMFINZI) 120 MG/2.4ML SOLN injection Inject 120 mg into the vein every 14 (fourteen) days.     Nyoka Cowden Tea, Camillia sinensis, (GREEN TEA EXTRACT PO) Take by mouth.    . Lactobacillus (PROBIOTIC ACIDOPHILUS) CAPS Take by mouth.    . MELATONIN GUMMIES PO Take by mouth.    . Milk Thistle 1000 MG CAPS Take 1 capsule by mouth daily.    . Misc Natural Products (BLACK COHOSH MENOPAUSE COMPLEX) TABS Take 1 capsule by mouth 2 (two) times daily.    . Misc Natural Products (TURMERIC CURCUMIN) CAPS Take 1 capsule by mouth every evening. 1500 mg capsule    . Multiple Vitamins-Minerals (WOMENS 50+ MULTI VITAMIN/MIN) TABS Take 1 tablet by mouth daily.    . naproxen sodium (ANAPROX) 220 MG tablet Take 220-440 mg by mouth 2 (two) times daily as needed (for pain).     Marland Kitchen OVER THE COUNTER MEDICATION Take 250 mg by mouth  daily. Jiaogulan Supplement    . pantoprazole (PROTONIX) 40 MG tablet Take 1 tablet (40 mg total) by mouth every evening. 90 tablet 3  . pravastatin (PRAVACHOL) 20 MG tablet Take 20 mg by mouth every evening.  3  . ranitidine (ZANTAC) 150 MG tablet Take 150-300 mg by mouth every evening. 300 mg with more acidic meals    . sucralfate (CARAFATE) 1 g tablet Take 1 tablet (1 g total) by mouth 4 (four) times daily -  with meals and at bedtime. 5 min before meals for  radiation induced esophagitis 120 tablet 2  . prochlorperazine (COMPAZINE) 10 MG tablet Take 1 tablet (10 mg total) by mouth every 6 (six) hours as needed for nausea or vomiting. (Patient not taking: Reported on 11/03/2016) 30 tablet 0   No current facility-administered medications for this visit.     SURGICAL HISTORY:  Past Surgical History:  Procedure Laterality Date  . ABDOMINAL HYSTERECTOMY    . VIDEO BRONCHOSCOPY Bilateral 07/03/2015   Procedure: VIDEO BRONCHOSCOPY WITH FLUORO;  Surgeon: Marshell Garfinkel, MD;  Location: [facility];  Service: Cardiopulmonary;  Laterality: Bilateral;  . VIDEO BRONCHOSCOPY WITH ENDOBRONCHIAL ULTRASOUND N/A 04/06/2016   Procedure: VIDEO BRONCHOSCOPY WITH ENDOBRONCHIAL ULTRASOUND;  Surgeon: Collene Gobble, MD;  Location: [facility];  Service: Thoracic;  Laterality: N/A;  . WRIST FRACTURE SURGERY      REVIEW OF SYSTEMS:  A comprehensive review of systems was negative except for: Respiratory: positive for cough   PHYSICAL EXAMINATION: General appearance: alert, cooperative and no distress Head: Normocephalic, without obvious abnormality, atraumatic Neck: no adenopathy, no JVD, supple, symmetrical, trachea midline and thyroid not enlarged, symmetric, no tenderness/mass/nodules Lymph nodes: Cervical, supraclavicular, and axillary nodes normal. Resp: clear to auscultation bilaterally Back: symmetric, no curvature. ROM normal. No CVA tenderness. Cardio: regular rate and rhythm, S1, S2 normal, no murmur, click, rub or gallop GI: soft, non-tender; bowel sounds normal; no masses,  no organomegaly Extremities: extremities normal, atraumatic, no cyanosis or edema  ECOG PERFORMANCE STATUS: 1 - Symptomatic but completely ambulatory  Blood pressure (!) 113/92, pulse 98, temperature 98.6 F (37 C), temperature source Oral, resp. rate 19, height 5' 7.5" (1.715 m), weight 192 lb 9.6 oz (87.4 kg), SpO2 98 %.  LABORATORY DATA: Lab Results  Component Value Date   WBC 4.4  11/03/2016   HGB 15.8 11/03/2016   HCT 46.6 11/03/2016   MCV 93.0 11/03/2016   PLT 223 11/03/2016      Chemistry      Component Value Date/Time   NA 142 11/03/2016 0906   K 3.8 11/03/2016 0906   CL 107 04/06/2016 0742   CO2 24 11/03/2016 0906   BUN 9.7 11/03/2016 0906   CREATININE 0.8 11/03/2016 0906      Component Value Date/Time   CALCIUM 10.2 11/03/2016 0906   ALKPHOS 90 11/03/2016 0906   AST 27 11/03/2016 0906   ALT 30 11/03/2016 0906   BILITOT 0.76 11/03/2016 0906       RADIOGRAPHIC STUDIES: No results found.  ASSESSMENT AND PLAN:  This is a very pleasant 51 years old white female with unresectable stage IIIa non-small cell lung cancer, adenocarcinoma status post concurrent chemoradiation with partial response. The patient is currently undergoing consolidation treatment with Imfinzi (Durvalumab) 10 MG/KG every 2 weeks is status post 5 cycles. She is tolerating this treatment well. I recommended for her to proceed with cycle #6 today as scheduled. I would see her back for follow-up visit in 2 weeks for evaluation after  repeating CT scan of the chest for restaging of her disease. She was advised to call immediately if she has any concerning symptoms in the interval. The patient voices understanding of current disease status and treatment options and is in agreement with the current care plan. All questions were answered. The patient knows to call the clinic with any problems, questions or concerns. We can certainly see the patient much sooner if necessary. I spent 10 minutes counseling the patient face to face. The total time spent in the appointment was 15 minutes. Disclaimer: This note was dictated with voice recognition software. Similar sounding words can inadvertently be transcribed and may not be corrected upon review.

## 2016-11-11 ENCOUNTER — Telehealth: Payer: Self-pay | Admitting: *Deleted

## 2016-11-11 NOTE — Telephone Encounter (Signed)
Pt called with c/o sore throat,redness, low grade fever of 100, feeling like she has swallowed glass in the back of her throat. Pt has concerns regarding upcoming treatment. Instructed pt to see PCP ro Urgent care as this could be strep throat. Pt agreed as " a co-worker recently got strep" Reviewed pt's upcoming appts, no further concerns.

## 2016-11-15 ENCOUNTER — Ambulatory Visit (HOSPITAL_COMMUNITY)
Admission: RE | Admit: 2016-11-15 | Discharge: 2016-11-15 | Disposition: A | Discharge status: Home / Self Care | Payer: Managed Care, Other (non HMO) | Source: Ambulatory Visit | Attending: Internal Medicine | Admitting: Internal Medicine

## 2016-11-15 ENCOUNTER — Encounter (HOSPITAL_COMMUNITY): Payer: Self-pay

## 2016-11-15 DIAGNOSIS — I700 Atherosclerosis of aorta: Secondary | ICD-10-CM | POA: Insufficient documentation

## 2016-11-15 DIAGNOSIS — Z5112 Encounter for antineoplastic immunotherapy: Secondary | ICD-10-CM

## 2016-11-15 DIAGNOSIS — J8410 Pulmonary fibrosis, unspecified: Secondary | ICD-10-CM | POA: Diagnosis not present

## 2016-11-15 DIAGNOSIS — J90 Pleural effusion, not elsewhere classified: Secondary | ICD-10-CM | POA: Insufficient documentation

## 2016-11-15 DIAGNOSIS — C3492 Malignant neoplasm of unspecified part of left bronchus or lung: Secondary | ICD-10-CM | POA: Diagnosis not present

## 2016-11-15 DIAGNOSIS — R918 Other nonspecific abnormal finding of lung field: Secondary | ICD-10-CM | POA: Diagnosis not present

## 2016-11-15 DIAGNOSIS — J439 Emphysema, unspecified: Secondary | ICD-10-CM | POA: Diagnosis not present

## 2016-11-15 MED ORDER — IOPAMIDOL (ISOVUE-300) INJECTION 61%
100.0000 mL | Freq: Once | INTRAVENOUS | Status: AC | PRN
  Administered 2016-11-15: 75 mL via INTRAVENOUS

## 2016-11-15 MED ORDER — IOPAMIDOL (ISOVUE-300) INJECTION 61%
INTRAVENOUS | Status: AC
  Filled 2016-11-15: qty 100

## 2016-11-17 ENCOUNTER — Encounter: Payer: Self-pay | Admitting: Internal Medicine

## 2016-11-17 ENCOUNTER — Other Ambulatory Visit (HOSPITAL_BASED_OUTPATIENT_CLINIC_OR_DEPARTMENT_OTHER): Payer: Managed Care, Other (non HMO)

## 2016-11-17 ENCOUNTER — Ambulatory Visit (HOSPITAL_BASED_OUTPATIENT_CLINIC_OR_DEPARTMENT_OTHER): Payer: Managed Care, Other (non HMO)

## 2016-11-17 ENCOUNTER — Telehealth: Payer: Self-pay | Admitting: Internal Medicine

## 2016-11-17 ENCOUNTER — Ambulatory Visit (HOSPITAL_BASED_OUTPATIENT_CLINIC_OR_DEPARTMENT_OTHER): Payer: Managed Care, Other (non HMO) | Admitting: Internal Medicine

## 2016-11-17 VITALS — BP 104/79 | HR 92 | Temp 98.5°F | Resp 18 | Ht 67.5 in | Wt 196.2 lb

## 2016-11-17 DIAGNOSIS — J209 Acute bronchitis, unspecified: Secondary | ICD-10-CM

## 2016-11-17 DIAGNOSIS — C3492 Malignant neoplasm of unspecified part of left bronchus or lung: Secondary | ICD-10-CM

## 2016-11-17 DIAGNOSIS — Z5112 Encounter for antineoplastic immunotherapy: Secondary | ICD-10-CM | POA: Diagnosis not present

## 2016-11-17 DIAGNOSIS — C3412 Malignant neoplasm of upper lobe, left bronchus or lung: Secondary | ICD-10-CM

## 2016-11-17 LAB — CBC WITH DIFFERENTIAL/PLATELET
BASO%: 1 % (ref 0.0–2.0)
Basophils Absolute: 0 10*3/uL (ref 0.0–0.1)
EOS%: 2.6 % (ref 0.0–7.0)
Eosinophils Absolute: 0.1 10*3/uL (ref 0.0–0.5)
HCT: 45.1 % (ref 34.8–46.6)
HGB: 15.2 g/dL (ref 11.6–15.9)
LYMPH%: 27.1 % (ref 14.0–49.7)
MCH: 31.4 pg (ref 25.1–34.0)
MCHC: 33.8 g/dL (ref 31.5–36.0)
MCV: 92.9 fL (ref 79.5–101.0)
MONO#: 0.6 10*3/uL (ref 0.1–0.9)
MONO%: 12.4 % (ref 0.0–14.0)
NEUT#: 2.5 10*3/uL (ref 1.5–6.5)
NEUT%: 56.9 % (ref 38.4–76.8)
PLATELETS: 193 10*3/uL (ref 145–400)
RBC: 4.86 10*6/uL (ref 3.70–5.45)
RDW: 12.5 % (ref 11.2–14.5)
WBC: 4.4 10*3/uL (ref 3.9–10.3)
lymph#: 1.2 10*3/uL (ref 0.9–3.3)

## 2016-11-17 LAB — COMPREHENSIVE METABOLIC PANEL
ALT: 31 U/L (ref 0–55)
ANION GAP: 12 meq/L — AB (ref 3–11)
AST: 23 U/L (ref 5–34)
Albumin: 4 g/dL (ref 3.5–5.0)
Alkaline Phosphatase: 96 U/L (ref 40–150)
BUN: 9.5 mg/dL (ref 7.0–26.0)
CO2: 25 meq/L (ref 22–29)
CREATININE: 0.8 mg/dL (ref 0.6–1.1)
Calcium: 10.1 mg/dL (ref 8.4–10.4)
Chloride: 106 mEq/L (ref 98–109)
EGFR: 84 mL/min/{1.73_m2} — ABNORMAL LOW (ref >90)
Glucose: 82 mg/dl (ref 70–140)
POTASSIUM: 4 meq/L (ref 3.5–5.1)
Sodium: 142 mEq/L (ref 136–145)
Total Bilirubin: 0.65 mg/dL (ref 0.20–1.20)
Total Protein: 7.3 g/dL (ref 6.4–8.3)

## 2016-11-17 MED ORDER — SODIUM CHLORIDE 0.9 % IV SOLN: Freq: Once | INTRAVENOUS | Status: DC

## 2016-11-17 MED ORDER — SODIUM CHLORIDE 0.9 % IV SOLN
10.0000 mg/kg | Freq: Once | INTRAVENOUS | Status: AC
  Administered 2016-11-17: 860 mg via INTRAVENOUS
  Filled 2016-11-17: qty 7.2

## 2016-11-17 NOTE — Patient Instructions (Signed)
[facility] Discharge Instructions for Patients Receiving Chemotherapy  Today you received the following chemotherapy agents: Imfinzi   To help prevent nausea and vomiting after your treatment, we encourage you to take your nausea medication as directed.    If you develop nausea and vomiting that is not controlled by your nausea medication, call the clinic.   BELOW ARE SYMPTOMS THAT SHOULD BE REPORTED IMMEDIATELY:  *FEVER GREATER THAN 100.5 F  *CHILLS WITH OR WITHOUT FEVER  NAUSEA AND VOMITING THAT IS NOT CONTROLLED WITH YOUR NAUSEA MEDICATION  *UNUSUAL SHORTNESS OF BREATH  *UNUSUAL BRUISING OR BLEEDING  TENDERNESS IN MOUTH AND THROAT WITH OR WITHOUT PRESENCE OF ULCERS  *URINARY PROBLEMS  *BOWEL PROBLEMS  UNUSUAL RASH Items with * indicate a potential emergency and should be followed up as soon as possible.  Feel free to call the clinic you have any questions or concerns. The clinic phone number is [phone].  Please show the [facility] at check-in to the Emergency Department and triage nurse.

## 2016-11-17 NOTE — Telephone Encounter (Signed)
Scheduled appt per 6/21 los - Gave patient AVS and calender per LOS. 

## 2016-11-17 NOTE — Progress Notes (Signed)
[place] Telephone:(336) [phone]   [phone]  OFFICE PROGRESS NOTE  Hulan Fess, MD [place] [address]  DIAGNOSIS: Stage IIIA (T2a, N2, M0) non-small cell lung cancer, adenocarcinoma presented with left suprahilar mass, mediastinal lymphadenopathy and suspicious pulmonary nodule in the left upper lobe diagnosed in November 2017. No actionable mutations on Guardant 360 testing.  PRIOR THERAPY: Status post concurrent chemoradiation with weekly carboplatin for AUC of 2 and paclitaxel 45 MG/M2 for 7 cycles. Last dose was given 06/13/2016.  CURRENT THERAPY: Immunotherapy with Imfinzi (Durvalumab) 10 MG/KG every 2 weeks. First dose 08/11/2016. Status post 6 cycles.  INTERVAL HISTORY: Debbie Baker 51 y.o. female returns to the clinic today for follow-up visit accompanied by her husband. The patient is currently on treatment with immunotherapy with Imfinzi (Durvalumab) status post 6 cycles and has been tolerating her treatment fairly well. She denied having any chest pain, shortness of breath but has cough with no hemoptysis. She was treated recently for bronchitis with Z-Pak. She denied having any weight loss or night sweats. She has no nausea, vomiting, diarrhea or constipation. She has no fever or chills. She had repeat CT scan of the chest performed recently and she is here for evaluation and discussion of her scan results.  MEDICAL HISTORY: Past Medical History:  Diagnosis Date  . Allergic rhinitis   . Deaf, left   . Diverticulosis   . Dyspnea   . Encounter for antineoplastic chemotherapy 04/18/2016  . Encounter for antineoplastic immunotherapy 07/21/2016  . GERD (gastroesophageal reflux disease)   . Hyperlipidemia   . Lung cancer ([place])    non small cell lung cancer favoring adenocarcinoma   . Lung nodules   . Migraine   . Odynophagia 05/24/2016  . Pneumonia   . PONV (postoperative nausea and vomiting)   . Tachycardia, paroxysmal  ([place]) 09/08/2016  . Wears glasses     ALLERGIES:  is allergic to penicillins; chantix [varenicline]; and levaquin [levofloxacin].  MEDICATIONS:  Current Outpatient Prescriptions  Medication Sig Dispense Refill  . albuterol (PROVENTIL HFA;VENTOLIN HFA) 108 (90 Base) MCG/ACT inhaler Inhale 2 puffs into the lungs every 6 (six) hours as needed for wheezing or shortness of breath. 1 Inhaler 0  . azithromycin (ZITHROMAX) 250 MG tablet TAKE 2 TABLETS BY MOUTH TODAY, THEN TAKE 1 TABLET DAILY FOR 4 DAYS  0  . benzonatate (TESSALON) 100 MG capsule Take 1 capsule (100 mg total) by mouth every 4 (four) hours as needed for cough. 60 capsule 1  . Calcium-Magnesium-Vitamin D 185-50-100 MG-MG-UNIT CAPS Take 1 tablet by mouth every evening.    . cetirizine (ZYRTEC) 10 MG tablet Take 10 mg by mouth daily.    . diphenhydrAMINE (BENADRYL) 25 mg capsule Take 25 mg by mouth at bedtime as needed.    . durvalumab (IMFINZI) 120 MG/2.4ML SOLN injection Inject 120 mg into the vein every 14 (fourteen) days.     Nyoka Cowden Tea, Camillia sinensis, (GREEN TEA EXTRACT PO) Take by mouth.    . Lactobacillus (PROBIOTIC ACIDOPHILUS) CAPS Take by mouth.    . MELATONIN GUMMIES PO Take by mouth.    . Milk Thistle 1000 MG CAPS Take 1 capsule by mouth daily.    . Misc Natural Products (BLACK COHOSH MENOPAUSE COMPLEX) TABS Take 1 capsule by mouth 2 (two) times daily.    . Misc Natural Products (TURMERIC CURCUMIN) CAPS Take 1 capsule by mouth every evening. 1500 mg capsule    .  Multiple Vitamins-Minerals (WOMENS 50+ MULTI VITAMIN/MIN) TABS Take 1 tablet by mouth daily.    . naproxen sodium (ANAPROX) 220 MG tablet Take 220-440 mg by mouth 2 (two) times daily as needed (for pain).     Marland Kitchen OVER THE COUNTER MEDICATION Take 250 mg by mouth daily. Jiaogulan Supplement    . pantoprazole (PROTONIX) 40 MG tablet Take 1 tablet (40 mg total) by mouth every evening. 90 tablet 3  . pravastatin (PRAVACHOL) 20 MG tablet Take 20 mg by mouth every  evening.  3  . prochlorperazine (COMPAZINE) 10 MG tablet Take 1 tablet (10 mg total) by mouth every 6 (six) hours as needed for nausea or vomiting. 30 tablet 0  . ranitidine (ZANTAC) 150 MG tablet Take 150-300 mg by mouth every evening. 300 mg with more acidic meals    . sucralfate (CARAFATE) 1 g tablet Take 1 tablet (1 g total) by mouth 4 (four) times daily -  with meals and at bedtime. 5 min before meals for radiation induced esophagitis 120 tablet 2   No current facility-administered medications for this visit.     SURGICAL HISTORY:  Past Surgical History:  Procedure Laterality Date  . ABDOMINAL HYSTERECTOMY    . VIDEO BRONCHOSCOPY Bilateral 07/03/2015   Procedure: VIDEO BRONCHOSCOPY WITH FLUORO;  Surgeon: Marshell Garfinkel, MD;  Location: [facility];  Service: Cardiopulmonary;  Laterality: Bilateral;  . VIDEO BRONCHOSCOPY WITH ENDOBRONCHIAL ULTRASOUND N/A 04/06/2016   Procedure: VIDEO BRONCHOSCOPY WITH ENDOBRONCHIAL ULTRASOUND;  Surgeon: Collene Gobble, MD;  Location: [facility];  Service: Thoracic;  Laterality: N/A;  . WRIST FRACTURE SURGERY      REVIEW OF SYSTEMS:  Constitutional: negative Eyes: negative Ears, nose, mouth, throat, and face: negative Respiratory: positive for cough Cardiovascular: negative Gastrointestinal: negative Genitourinary:negative Integument/breast: negative Hematologic/lymphatic: negative Musculoskeletal:negative Neurological: negative Behavioral/Psych: negative Endocrine: negative Allergic/Immunologic: negative   PHYSICAL EXAMINATION: General appearance: alert, cooperative and no distress Head: Normocephalic, without obvious abnormality, atraumatic Neck: no adenopathy, no JVD, supple, symmetrical, trachea midline and thyroid not enlarged, symmetric, no tenderness/mass/nodules Lymph nodes: Cervical, supraclavicular, and axillary nodes normal. Resp: clear to auscultation bilaterally Back: symmetric, no curvature. ROM normal. No CVA tenderness. Cardio:  regular rate and rhythm, S1, S2 normal, no murmur, click, rub or gallop GI: soft, non-tender; bowel sounds normal; no masses,  no organomegaly Extremities: extremities normal, atraumatic, no cyanosis or edema Neurologic: Alert and oriented X 3, normal strength and tone. Normal symmetric reflexes. Normal coordination and gait  ECOG PERFORMANCE STATUS: 1 - Symptomatic but completely ambulatory  Blood pressure 104/79, pulse 92, temperature 98.5 F (36.9 C), resp. rate 18, height 5' 7.5" (1.715 m), weight 196 lb 3.2 oz (89 kg), SpO2 97 %.  LABORATORY DATA: Lab Results  Component Value Date   WBC 4.4 11/17/2016   HGB 15.2 11/17/2016   HCT 45.1 11/17/2016   MCV 92.9 11/17/2016   PLT 193 11/17/2016      Chemistry      Component Value Date/Time   NA 142 11/17/2016 0853   K 4.0 11/17/2016 0853   CL 107 04/06/2016 0742   CO2 25 11/17/2016 0853   BUN 9.5 11/17/2016 0853   CREATININE 0.8 11/17/2016 0853      Component Value Date/Time   CALCIUM 10.1 11/17/2016 0853   ALKPHOS 96 11/17/2016 0853   AST 23 11/17/2016 0853   ALT 31 11/17/2016 0853   BILITOT 0.65 11/17/2016 0853       RADIOGRAPHIC STUDIES: Ct Chest W Contrast  Result Date: 11/15/2016 CLINICAL  DATA:  Stage IIIA left upper lobe primary bronchogenic adenocarcinoma diagnosed November 2017 status post concurrent chemoradiation therapy completed 06/17/2016 with ongoing immunotherapy. Restaging. EXAM: CT CHEST WITH CONTRAST TECHNIQUE: Multidetector CT imaging of the chest was performed during intravenous contrast administration. CONTRAST:  41mL ISOVUE-300 IOPAMIDOL (ISOVUE-300) INJECTION 61% COMPARISON:  09/22/2016 chest radiograph.   07/18/2016 chest CT. FINDINGS: Cardiovascular: Normal heart size. Trace pericardial effusion/ thickening, minimally increased. Mildly atherosclerotic nonaneurysmal thoracic aorta. Normal caliber pulmonary arteries. No central pulmonary emboli. Mediastinum/Nodes: No discrete thyroid nodules.  Unremarkable esophagus. New 1.1 x 0.6 cm soft tissue focus in the left anterior mediastinum (series 2/ image 81) near the pericardium. Confluent infiltrative soft tissue in the low left paratracheal region measures up to 1.2 cm thickness (series 2/ image 54), mildly decreased from 1.6 cm on 07/18/2016. Confluent infiltrative soft tissue in the anterior left hilum measures up to 1.3 cm (series 2/ image 58), decreased from 2.0 cm. Otherwise no pathologically enlarged mediastinal or hilar nodes. Lungs/Pleura: No pneumothorax. No right pleural effusion. Small dependent and basilar left pleural effusion, mildly increased. Mild centrilobular and paraseptal emphysema. Stable subcentimeter calcified right upper lobe granuloma. Masslike fibrosis in the parahilar upper left lung measures up to 10.0 x 3.8 cm (series 7/ image 56), largely new, compatible with evolving postradiation change. A few scattered small pulmonary nodules in both lungs measuring up to the 5 mm in the apical left upper lobe (series 7/ image 35) and 3 mm in the right lower lobe (series 7/ image 84) are all stable. Irregular nodular bandlike opacity in the inferior segment lingula is unchanged, favor postinfectious/postinflammatory scarring. No new significant pulmonary nodules. Upper abdomen: No discrete adrenal nodules. Musculoskeletal: No aggressive appearing focal osseous lesions. Mild thoracic spondylosis. IMPRESSION: 1. Largely new masslike fibrosis in the parahilar left upper lung, most compatible with evolving postradiation change . 2. Infiltrative soft tissue in the anterior left hilum and left lower paratracheal mediastinum is decreased, compatible with at least partial treatment response. 3. New small nodular soft tissue focus in the left anterior mediastinum near the pericardium, indeterminate for posttreatment change versus new pathologic mediastinal lymph node . Recommend attention on short-term follow-up chest CT with IV contrast. 4.  Scattered bilateral pulmonary nodularity is stable . 5. Small left pleural effusion, mildly increased. Aortic Atherosclerosis (ICD10-I70.0) and Emphysema (ICD10-J43.9). Electronically Signed   By: Ilona Sorrel M.D.   On: 11/15/2016 13:33    ASSESSMENT AND PLAN:  This is a very pleasant 51 years old white female with unresectable a stage IIIa non-small cell lung cancer, adenocarcinoma status post a course of concurrent chemoradiation with partial response. The patient is currently undergoing consolidation immunotherapy with Imfinzi (Durvalumab) status post 6 cycles and has been tolerating this treatment well with no significant adverse effects. She had repeat CT scan of the chest performed recently. I personally and independently reviewed the scan images and discuss the results with the patient and her husband and showed them the images. Her scan showed no clear evidence for disease progression but there was a soft tissue density in the left anterior mediastinum near the pericardium that will need close follow-up bone marrow coming scans. I recommended for the patient to continue her treatment with Imfinzi (Durvalumab) and she will proceed with cycle #7 today. I will see her back for follow-up visit in 2 weeks for reevaluation before the next cycle of her treatment. For the acute bronchitis, she will continue her treatment with Z-Pak. She was advised to call immediately if she  has any concerning symptoms in the interval. The patient voices understanding of current disease status and treatment options and is in agreement with the current care plan. All questions were answered. The patient knows to call the clinic with any problems, questions or concerns. We can certainly see the patient much sooner if necessary.  Disclaimer: This note was dictated with voice recognition software. Similar sounding words can inadvertently be transcribed and may not be corrected upon review.

## 2016-11-25 ENCOUNTER — Telehealth: Payer: Self-pay | Admitting: Medical Oncology

## 2016-11-25 NOTE — Telephone Encounter (Signed)
Pt called to cancel July 5th treatment because she is being treated for sinus infection.she would like to r/s for the week after.

## 2016-11-29 ENCOUNTER — Telehealth: Payer: Self-pay | Admitting: *Deleted

## 2016-11-29 NOTE — Telephone Encounter (Signed)
Returned call to pt regarding appt 7/5 to be cancelled. Informed pt this appt has been cancelled and her next appt is 7/19. Pt advised she has not been called about this. No further concerns, pt verbalized understanding and future appts.

## 2016-12-01 ENCOUNTER — Ambulatory Visit: Payer: Managed Care, Other (non HMO)

## 2016-12-01 ENCOUNTER — Other Ambulatory Visit: Payer: Managed Care, Other (non HMO)

## 2016-12-01 ENCOUNTER — Ambulatory Visit: Payer: Managed Care, Other (non HMO) | Admitting: Internal Medicine

## 2016-12-15 ENCOUNTER — Telehealth: Payer: Self-pay | Admitting: Internal Medicine

## 2016-12-15 ENCOUNTER — Ambulatory Visit (HOSPITAL_BASED_OUTPATIENT_CLINIC_OR_DEPARTMENT_OTHER): Payer: Managed Care, Other (non HMO)

## 2016-12-15 ENCOUNTER — Other Ambulatory Visit (HOSPITAL_BASED_OUTPATIENT_CLINIC_OR_DEPARTMENT_OTHER): Payer: Managed Care, Other (non HMO)

## 2016-12-15 ENCOUNTER — Ambulatory Visit (HOSPITAL_BASED_OUTPATIENT_CLINIC_OR_DEPARTMENT_OTHER): Payer: Managed Care, Other (non HMO) | Admitting: Internal Medicine

## 2016-12-15 ENCOUNTER — Encounter: Payer: Self-pay | Admitting: Internal Medicine

## 2016-12-15 VITALS — HR 87

## 2016-12-15 VITALS — BP 103/74 | HR 101 | Temp 98.5°F | Resp 18 | Ht 67.5 in | Wt 197.6 lb

## 2016-12-15 DIAGNOSIS — C3412 Malignant neoplasm of upper lobe, left bronchus or lung: Secondary | ICD-10-CM

## 2016-12-15 DIAGNOSIS — Z79899 Other long term (current) drug therapy: Secondary | ICD-10-CM | POA: Diagnosis not present

## 2016-12-15 DIAGNOSIS — Z5112 Encounter for antineoplastic immunotherapy: Secondary | ICD-10-CM

## 2016-12-15 DIAGNOSIS — C3492 Malignant neoplasm of unspecified part of left bronchus or lung: Secondary | ICD-10-CM

## 2016-12-15 LAB — COMPREHENSIVE METABOLIC PANEL
ALT: 33 U/L (ref 0–55)
AST: 27 U/L (ref 5–34)
Albumin: 3.9 g/dL (ref 3.5–5.0)
Alkaline Phosphatase: 105 U/L (ref 40–150)
Anion Gap: 9 mEq/L (ref 3–11)
BILIRUBIN TOTAL: 0.48 mg/dL (ref 0.20–1.20)
BUN: 11.7 mg/dL (ref 7.0–26.0)
CALCIUM: 9.5 mg/dL (ref 8.4–10.4)
CHLORIDE: 108 meq/L (ref 98–109)
CO2: 23 meq/L (ref 22–29)
CREATININE: 0.8 mg/dL (ref 0.6–1.1)
EGFR: 88 mL/min/{1.73_m2} — ABNORMAL LOW (ref >90)
Glucose: 102 mg/dl (ref 70–140)
Potassium: 4.2 mEq/L (ref 3.5–5.1)
Sodium: 140 mEq/L (ref 136–145)
TOTAL PROTEIN: 6.9 g/dL (ref 6.4–8.3)

## 2016-12-15 LAB — CBC WITH DIFFERENTIAL/PLATELET
BASO%: 0.9 % (ref 0.0–2.0)
Basophils Absolute: 0 10*3/uL (ref 0.0–0.1)
EOS%: 2.1 % (ref 0.0–7.0)
Eosinophils Absolute: 0.1 10*3/uL (ref 0.0–0.5)
HEMATOCRIT: 43.4 % (ref 34.8–46.6)
HGB: 14.7 g/dL (ref 11.6–15.9)
LYMPH#: 1.1 10*3/uL (ref 0.9–3.3)
LYMPH%: 23.8 % (ref 14.0–49.7)
MCH: 31.4 pg (ref 25.1–34.0)
MCHC: 33.9 g/dL (ref 31.5–36.0)
MCV: 92.7 fL (ref 79.5–101.0)
MONO#: 0.4 10*3/uL (ref 0.1–0.9)
MONO%: 9.9 % (ref 0.0–14.0)
NEUT%: 63.3 % (ref 38.4–76.8)
NEUTROS ABS: 2.8 10*3/uL (ref 1.5–6.5)
PLATELETS: 196 10*3/uL (ref 145–400)
RBC: 4.69 10*6/uL (ref 3.70–5.45)
RDW: 13 % (ref 11.2–14.5)
WBC: 4.5 10*3/uL (ref 3.9–10.3)

## 2016-12-15 LAB — TSH: TSH: 1.259 m[IU]/L (ref 0.308–3.960)

## 2016-12-15 MED ORDER — SODIUM CHLORIDE 0.9 % IV SOLN
Freq: Once | INTRAVENOUS | Status: AC
  Administered 2016-12-15: 10:00:00 via INTRAVENOUS

## 2016-12-15 MED ORDER — SODIUM CHLORIDE 0.9 % IV SOLN
10.0000 mg/kg | Freq: Once | INTRAVENOUS | Status: AC
  Administered 2016-12-15: 860 mg via INTRAVENOUS
  Filled 2016-12-15: qty 10

## 2016-12-15 NOTE — Telephone Encounter (Signed)
Scheduled appt per 7/19 los - Gave patient AVS and calender per los.

## 2016-12-15 NOTE — Patient Instructions (Signed)
[facility] Discharge Instructions for Patients Receiving Chemotherapy  Today you received the following chemotherapy agents: Imfinzi  To help prevent nausea and vomiting after your treatment, we encourage you to take your nausea medication as directed.    If you develop nausea and vomiting that is not controlled by your nausea medication, call the clinic.   BELOW ARE SYMPTOMS THAT SHOULD BE REPORTED IMMEDIATELY:  *FEVER GREATER THAN 100.5 F  *CHILLS WITH OR WITHOUT FEVER  NAUSEA AND VOMITING THAT IS NOT CONTROLLED WITH YOUR NAUSEA MEDICATION  *UNUSUAL SHORTNESS OF BREATH  *UNUSUAL BRUISING OR BLEEDING  TENDERNESS IN MOUTH AND THROAT WITH OR WITHOUT PRESENCE OF ULCERS  *URINARY PROBLEMS  *BOWEL PROBLEMS  UNUSUAL RASH Items with * indicate a potential emergency and should be followed up as soon as possible.  Feel free to call the clinic you have any questions or concerns. The clinic phone number is [phone].  Please show the [facility] at check-in to the Emergency Department and triage nurse.

## 2016-12-15 NOTE — Progress Notes (Signed)
[facility] [place] Telephone:(336) [phone]   Fax:(336) [phone]  OFFICE PROGRESS NOTE  Hulan Fess, MD [place] [address]  DIAGNOSIS: Stage IIIA (T2a, N2, M0) non-small cell lung cancer, adenocarcinoma presented with left suprahilar mass, mediastinal lymphadenopathy and suspicious pulmonary nodule in the left upper lobe diagnosed in November 2017. No actionable mutations on Guardant 360 testing.  PRIOR THERAPY: Status post concurrent chemoradiation with weekly carboplatin for AUC of 2 and paclitaxel 45 MG/M2 for 7 cycles. Last dose was given 06/13/2016.  CURRENT THERAPY: Immunotherapy with Imfinzi (Durvalumab) 10 MG/KG every 2 weeks. First dose 08/11/2016. Status post 7 cycles.  INTERVAL HISTORY: Debbie Baker 51 y.o. female returns to the clinic today for follow-up visit. Her last treatment was 4 weeks ago. He was delayed by one week but the patient did not receive rescheduling of her appointment. She is feeling much better today. She denied having any chest pain, shortness of breath, cough or hemoptysis. She denied having any weight loss or night sweats. She has no nausea, vomiting, diarrhea or constipation. She has intermittent left shoulder pain. She is here today for reevaluation before starting cycle #8.   MEDICAL HISTORY: Past Medical History:  Diagnosis Date  . Allergic rhinitis   . Deaf, left   . Diverticulosis   . Dyspnea   . Encounter for antineoplastic chemotherapy 04/18/2016  . Encounter for antineoplastic immunotherapy 07/21/2016  . GERD (gastroesophageal reflux disease)   . Hyperlipidemia   . Lung cancer ([place])    non small cell lung cancer favoring adenocarcinoma   . Lung nodules   . Migraine   . Odynophagia 05/24/2016  . Pneumonia   . PONV (postoperative nausea and vomiting)   . Tachycardia, paroxysmal ([place]) 09/08/2016  . Wears glasses     ALLERGIES:  is allergic to penicillins; chantix [varenicline]; and levaquin  [levofloxacin].  MEDICATIONS:  Current Outpatient Prescriptions  Medication Sig Dispense Refill  . albuterol (PROVENTIL HFA;VENTOLIN HFA) 108 (90 Base) MCG/ACT inhaler Inhale 2 puffs into the lungs every 6 (six) hours as needed for wheezing or shortness of breath. 1 Inhaler 0  . benzonatate (TESSALON) 100 MG capsule Take 1 capsule (100 mg total) by mouth every 4 (four) hours as needed for cough. 60 capsule 1  . Calcium-Magnesium-Vitamin D 185-50-100 MG-MG-UNIT CAPS Take 1 tablet by mouth every evening.    . cetirizine (ZYRTEC) 10 MG tablet Take 10 mg by mouth daily.    . diphenhydrAMINE (BENADRYL) 25 mg capsule Take 25 mg by mouth at bedtime as needed.    . durvalumab (IMFINZI) 120 MG/2.4ML SOLN injection Inject 120 mg into the vein every 14 (fourteen) days.     Nyoka Cowden Tea, Camillia sinensis, (GREEN TEA EXTRACT PO) Take by mouth.    . Lactobacillus (PROBIOTIC ACIDOPHILUS) CAPS Take by mouth.    . MELATONIN GUMMIES PO Take by mouth.    . Milk Thistle 1000 MG CAPS Take 1 capsule by mouth daily.    . Misc Natural Products (BLACK COHOSH MENOPAUSE COMPLEX) TABS Take 1 capsule by mouth 2 (two) times daily.    . Misc Natural Products (TURMERIC CURCUMIN) CAPS Take 1 capsule by mouth every evening. 1500 mg capsule    . Multiple Vitamins-Minerals (WOMENS 50+ MULTI VITAMIN/MIN) TABS Take 1 tablet by mouth daily.    . naproxen sodium (ANAPROX) 220 MG tablet Take 220-440 mg by mouth 2 (two) times daily as needed (for pain).     Marland Kitchen OVER THE  COUNTER MEDICATION Take 250 mg by mouth daily. Jiaogulan Supplement    . pantoprazole (PROTONIX) 40 MG tablet Take 1 tablet (40 mg total) by mouth every evening. 90 tablet 3  . pravastatin (PRAVACHOL) 20 MG tablet Take 20 mg by mouth every evening.  3  . prochlorperazine (COMPAZINE) 10 MG tablet Take 1 tablet (10 mg total) by mouth every 6 (six) hours as needed for nausea or vomiting. 30 tablet 0  . ranitidine (ZANTAC) 150 MG tablet Take 150-300 mg by mouth every  evening. 300 mg with more acidic meals    . sucralfate (CARAFATE) 1 g tablet Take 1 tablet (1 g total) by mouth 4 (four) times daily -  with meals and at bedtime. 5 min before meals for radiation induced esophagitis 120 tablet 2   No current facility-administered medications for this visit.     SURGICAL HISTORY:  Past Surgical History:  Procedure Laterality Date  . ABDOMINAL HYSTERECTOMY    . VIDEO BRONCHOSCOPY Bilateral 07/03/2015   Procedure: VIDEO BRONCHOSCOPY WITH FLUORO;  Surgeon: Marshell Garfinkel, MD;  Location: [facility];  Service: Cardiopulmonary;  Laterality: Bilateral;  . VIDEO BRONCHOSCOPY WITH ENDOBRONCHIAL ULTRASOUND N/A 04/06/2016   Procedure: VIDEO BRONCHOSCOPY WITH ENDOBRONCHIAL ULTRASOUND;  Surgeon: Collene Gobble, MD;  Location: [facility];  Service: Thoracic;  Laterality: N/A;  . WRIST FRACTURE SURGERY      REVIEW OF SYSTEMS:  A comprehensive review of systems was negative.   PHYSICAL EXAMINATION: General appearance: alert, cooperative and no distress Head: Normocephalic, without obvious abnormality, atraumatic Neck: no adenopathy, no JVD, supple, symmetrical, trachea midline and thyroid not enlarged, symmetric, no tenderness/mass/nodules Lymph nodes: Cervical, supraclavicular, and axillary nodes normal. Resp: clear to auscultation bilaterally Back: symmetric, no curvature. ROM normal. No CVA tenderness. Cardio: regular rate and rhythm, S1, S2 normal, no murmur, click, rub or gallop GI: soft, non-tender; bowel sounds normal; no masses,  no organomegaly Extremities: extremities normal, atraumatic, no cyanosis or edema  ECOG PERFORMANCE STATUS: 1 - Symptomatic but completely ambulatory  Blood pressure 103/74, pulse (!) 101, temperature 98.5 F (36.9 C), temperature source Oral, resp. rate 18, height 5' 7.5" (1.715 m), weight 197 lb 9.6 oz (89.6 kg), SpO2 98 %.  LABORATORY DATA: Lab Results  Component Value Date   WBC 4.5 12/15/2016   HGB 14.7 12/15/2016   HCT 43.4  12/15/2016   MCV 92.7 12/15/2016   PLT 196 12/15/2016      Chemistry      Component Value Date/Time   NA 142 11/17/2016 0853   K 4.0 11/17/2016 0853   CL 107 04/06/2016 0742   CO2 25 11/17/2016 0853   BUN 9.5 11/17/2016 0853   CREATININE 0.8 11/17/2016 0853      Component Value Date/Time   CALCIUM 10.1 11/17/2016 0853   ALKPHOS 96 11/17/2016 0853   AST 23 11/17/2016 0853   ALT 31 11/17/2016 0853   BILITOT 0.65 11/17/2016 0853       RADIOGRAPHIC STUDIES: Ct Chest W Contrast  Result Date: 11/15/2016 CLINICAL DATA:  Stage IIIA left upper lobe primary bronchogenic adenocarcinoma diagnosed November 2017 status post concurrent chemoradiation therapy completed 06/17/2016 with ongoing immunotherapy. Restaging. EXAM: CT CHEST WITH CONTRAST TECHNIQUE: Multidetector CT imaging of the chest was performed during intravenous contrast administration. CONTRAST:  81mL ISOVUE-300 IOPAMIDOL (ISOVUE-300) INJECTION 61% COMPARISON:  09/22/2016 chest radiograph.   07/18/2016 chest CT. FINDINGS: Cardiovascular: Normal heart size. Trace pericardial effusion/ thickening, minimally increased. Mildly atherosclerotic nonaneurysmal thoracic aorta. Normal caliber pulmonary arteries. No central  pulmonary emboli. Mediastinum/Nodes: No discrete thyroid nodules. Unremarkable esophagus. New 1.1 x 0.6 cm soft tissue focus in the left anterior mediastinum (series 2/ image 81) near the pericardium. Confluent infiltrative soft tissue in the low left paratracheal region measures up to 1.2 cm thickness (series 2/ image 54), mildly decreased from 1.6 cm on 07/18/2016. Confluent infiltrative soft tissue in the anterior left hilum measures up to 1.3 cm (series 2/ image 58), decreased from 2.0 cm. Otherwise no pathologically enlarged mediastinal or hilar nodes. Lungs/Pleura: No pneumothorax. No right pleural effusion. Small dependent and basilar left pleural effusion, mildly increased. Mild centrilobular and paraseptal emphysema.  Stable subcentimeter calcified right upper lobe granuloma. Masslike fibrosis in the parahilar upper left lung measures up to 10.0 x 3.8 cm (series 7/ image 56), largely new, compatible with evolving postradiation change. A few scattered small pulmonary nodules in both lungs measuring up to the 5 mm in the apical left upper lobe (series 7/ image 35) and 3 mm in the right lower lobe (series 7/ image 84) are all stable. Irregular nodular bandlike opacity in the inferior segment lingula is unchanged, favor postinfectious/postinflammatory scarring. No new significant pulmonary nodules. Upper abdomen: No discrete adrenal nodules. Musculoskeletal: No aggressive appearing focal osseous lesions. Mild thoracic spondylosis. IMPRESSION: 1. Largely new masslike fibrosis in the parahilar left upper lung, most compatible with evolving postradiation change . 2. Infiltrative soft tissue in the anterior left hilum and left lower paratracheal mediastinum is decreased, compatible with at least partial treatment response. 3. New small nodular soft tissue focus in the left anterior mediastinum near the pericardium, indeterminate for posttreatment change versus new pathologic mediastinal lymph node . Recommend attention on short-term follow-up chest CT with IV contrast. 4. Scattered bilateral pulmonary nodularity is stable . 5. Small left pleural effusion, mildly increased. Aortic Atherosclerosis (ICD10-I70.0) and Emphysema (ICD10-J43.9). Electronically Signed   By: Ilona Sorrel M.D.   On: 11/15/2016 13:33    ASSESSMENT AND PLAN:  This is a very pleasant 51 years old white female with unresectable a stage IIIa non-small cell lung cancer, adenocarcinoma status post a course of concurrent chemoradiation with partial response. The patient is currently undergoing consolidation immunotherapy with Imfinzi (Durvalumab) status post 7 cycles and has been tolerating this treatment well with no significant adverse effects. I recommended for  the patient to proceed with cycle #8 today as a scheduled. I will see her back for follow-up visit in 2 weeks for evaluation before starting cycle #9. The patient was advised to call immediately if she has any concerning symptoms in the interval. The patient voices understanding of current disease status and treatment options and is in agreement with the current care plan. All questions were answered. The patient knows to call the clinic with any problems, questions or concerns. We can certainly see the patient much sooner if necessary.  Disclaimer: This note was dictated with voice recognition software. Similar sounding words can inadvertently be transcribed and may not be corrected upon review.

## 2016-12-29 ENCOUNTER — Encounter: Payer: Self-pay | Admitting: Internal Medicine

## 2016-12-29 ENCOUNTER — Ambulatory Visit (HOSPITAL_BASED_OUTPATIENT_CLINIC_OR_DEPARTMENT_OTHER): Payer: Managed Care, Other (non HMO)

## 2016-12-29 ENCOUNTER — Telehealth: Payer: Self-pay

## 2016-12-29 ENCOUNTER — Ambulatory Visit (HOSPITAL_BASED_OUTPATIENT_CLINIC_OR_DEPARTMENT_OTHER): Payer: Managed Care, Other (non HMO) | Admitting: Internal Medicine

## 2016-12-29 ENCOUNTER — Other Ambulatory Visit (HOSPITAL_BASED_OUTPATIENT_CLINIC_OR_DEPARTMENT_OTHER): Payer: Managed Care, Other (non HMO)

## 2016-12-29 VITALS — BP 85/63 | HR 90 | Temp 98.5°F | Resp 17 | Ht 67.5 in | Wt 199.5 lb

## 2016-12-29 DIAGNOSIS — Z5112 Encounter for antineoplastic immunotherapy: Secondary | ICD-10-CM

## 2016-12-29 DIAGNOSIS — C3412 Malignant neoplasm of upper lobe, left bronchus or lung: Secondary | ICD-10-CM

## 2016-12-29 DIAGNOSIS — C3492 Malignant neoplasm of unspecified part of left bronchus or lung: Secondary | ICD-10-CM

## 2016-12-29 LAB — CBC WITH DIFFERENTIAL/PLATELET
BASO%: 0.6 % (ref 0.0–2.0)
Basophils Absolute: 0 10*3/uL (ref 0.0–0.1)
EOS%: 1.5 % (ref 0.0–7.0)
Eosinophils Absolute: 0.1 10*3/uL (ref 0.0–0.5)
HCT: 45.4 % (ref 34.8–46.6)
HGB: 15 g/dL (ref 11.6–15.9)
LYMPH%: 25.6 % (ref 14.0–49.7)
MCH: 31.3 pg (ref 25.1–34.0)
MCHC: 33 g/dL (ref 31.5–36.0)
MCV: 94.6 fL (ref 79.5–101.0)
MONO#: 0.5 10*3/uL (ref 0.1–0.9)
MONO%: 10.3 % (ref 0.0–14.0)
NEUT%: 62 % (ref 38.4–76.8)
NEUTROS ABS: 3.3 10*3/uL (ref 1.5–6.5)
PLATELETS: 207 10*3/uL (ref 145–400)
RBC: 4.8 10*6/uL (ref 3.70–5.45)
RDW: 13.1 % (ref 11.2–14.5)
WBC: 5.2 10*3/uL (ref 3.9–10.3)
lymph#: 1.3 10*3/uL (ref 0.9–3.3)

## 2016-12-29 LAB — COMPREHENSIVE METABOLIC PANEL
ALT: 33 U/L (ref 0–55)
ANION GAP: 10 meq/L (ref 3–11)
AST: 29 U/L (ref 5–34)
Albumin: 4 g/dL (ref 3.5–5.0)
Alkaline Phosphatase: 102 U/L (ref 40–150)
BILIRUBIN TOTAL: 0.52 mg/dL (ref 0.20–1.20)
BUN: 13.2 mg/dL (ref 7.0–26.0)
CO2: 25 meq/L (ref 22–29)
CREATININE: 0.8 mg/dL (ref 0.6–1.1)
Calcium: 9.7 mg/dL (ref 8.4–10.4)
Chloride: 106 mEq/L (ref 98–109)
EGFR: 88 mL/min/{1.73_m2} — ABNORMAL LOW (ref >90)
GLUCOSE: 107 mg/dL (ref 70–140)
Potassium: 4.3 mEq/L (ref 3.5–5.1)
SODIUM: 141 meq/L (ref 136–145)
TOTAL PROTEIN: 7.1 g/dL (ref 6.4–8.3)

## 2016-12-29 MED ORDER — SODIUM CHLORIDE 0.9 % IV SOLN
10.0000 mg/kg | Freq: Once | INTRAVENOUS | Status: AC
  Administered 2016-12-29: 860 mg via INTRAVENOUS
  Filled 2016-12-29: qty 7.2

## 2016-12-29 MED ORDER — SODIUM CHLORIDE 0.9 % IV SOLN
Freq: Once | INTRAVENOUS | Status: AC
  Administered 2016-12-29: 11:00:00 via INTRAVENOUS

## 2016-12-29 NOTE — Progress Notes (Signed)
[facility] [phone]) [phone]   Fax:([phone]  OFFICE PROGRESS NOTE  Hulan Fess, MD [place] [address]  DIAGNOSIS: Stage IIIA (T2a, N2, M0) non-small cell lung cancer, adenocarcinoma presented with left suprahilar mass, mediastinal lymphadenopathy and suspicious pulmonary nodule in the left upper lobe diagnosed in November 2017. No actionable mutations on Guardant 360 testing.  PRIOR THERAPY: Status post concurrent chemoradiation with weekly carboplatin for AUC of 2 and paclitaxel 45 MG/M2 for 7 cycles. Last dose was given 06/13/2016.  CURRENT THERAPY: Immunotherapy with Imfinzi (Durvalumab) 10 MG/KG every 2 weeks. First dose 08/11/2016. Status post 8 cycles.  INTERVAL HISTORY: Debbie Baker 51 y.o. female returns to the clinic today for follow-up visit. The patient is feeling fine today with no specific complaints except for intermittent left shoulder pain. She took Advil PM to be able to sleep last night. She denied having any current shortness of breath, cough or hemoptysis. She denied having any fever or chills. She has no nausea, vomiting, diarrhea or constipation. She is tolerating her treatment fairly well. She is here today for evaluation before starting cycle #9.   MEDICAL HISTORY: Past Medical History:  Diagnosis Date  . Allergic rhinitis   . Deaf, left   . Diverticulosis   . Dyspnea   . Encounter for antineoplastic chemotherapy 04/18/2016  . Encounter for antineoplastic immunotherapy 07/21/2016  . GERD (gastroesophageal reflux disease)   . Hyperlipidemia   . Lung cancer ([place])    non small cell lung cancer favoring adenocarcinoma   . Lung nodules   . Migraine   . Odynophagia 05/24/2016  . Pneumonia   . PONV (postoperative nausea and vomiting)   . Tachycardia, paroxysmal ([place]) 09/08/2016  . Wears glasses     ALLERGIES:  is allergic to penicillins; chantix [varenicline]; and levaquin [levofloxacin].  MEDICATIONS:    Current Outpatient Prescriptions  Medication Sig Dispense Refill  . albuterol (PROVENTIL HFA;VENTOLIN HFA) 108 (90 Base) MCG/ACT inhaler Inhale 2 puffs into the lungs every 6 (six) hours as needed for wheezing or shortness of breath. 1 Inhaler 0  . benzonatate (TESSALON) 100 MG capsule Take 1 capsule (100 mg total) by mouth every 4 (four) hours as needed for cough. 60 capsule 1  . Calcium-Magnesium-Vitamin D 185-50-100 MG-MG-UNIT CAPS Take 1 tablet by mouth every evening.    . cetirizine (ZYRTEC) 10 MG tablet Take 10 mg by mouth daily.    . diphenhydrAMINE (BENADRYL) 25 mg capsule Take 25 mg by mouth at bedtime as needed.    . durvalumab (IMFINZI) 120 MG/2.4ML SOLN injection Inject 120 mg into the vein every 14 (fourteen) days.     Nyoka Cowden Tea, Camillia sinensis, (GREEN TEA EXTRACT PO) Take by mouth.    . Lactobacillus (PROBIOTIC ACIDOPHILUS) CAPS Take by mouth.    . MELATONIN GUMMIES PO Take by mouth.    . Milk Thistle 1000 MG CAPS Take 1 capsule by mouth daily.    . Misc Natural Products (BLACK COHOSH MENOPAUSE COMPLEX) TABS Take 1 capsule by mouth 2 (two) times daily.    . Misc Natural Products (TURMERIC CURCUMIN) CAPS Take 1 capsule by mouth every evening. 1500 mg capsule    . Multiple Vitamins-Minerals (WOMENS 50+ MULTI VITAMIN/MIN) TABS Take 1 tablet by mouth daily.    . naproxen sodium (ANAPROX) 220 MG tablet Take 220-440 mg by mouth 2 (two) times daily as needed (for pain).     Marland Kitchen OVER THE COUNTER MEDICATION Take  250 mg by mouth daily. Jiaogulan Supplement    . pantoprazole (PROTONIX) 40 MG tablet Take 1 tablet (40 mg total) by mouth every evening. 90 tablet 3  . pravastatin (PRAVACHOL) 20 MG tablet Take 20 mg by mouth every evening.  3  . prochlorperazine (COMPAZINE) 10 MG tablet Take 1 tablet (10 mg total) by mouth every 6 (six) hours as needed for nausea or vomiting. 30 tablet 0  . ranitidine (ZANTAC) 150 MG tablet Take 150-300 mg by mouth every evening. 300 mg with more acidic meals     . sucralfate (CARAFATE) 1 g tablet Take 1 tablet (1 g total) by mouth 4 (four) times daily -  with meals and at bedtime. 5 min before meals for radiation induced esophagitis 120 tablet 2   No current facility-administered medications for this visit.     SURGICAL HISTORY:  Past Surgical History:  Procedure Laterality Date  . ABDOMINAL HYSTERECTOMY    . VIDEO BRONCHOSCOPY Bilateral 07/03/2015   Procedure: VIDEO BRONCHOSCOPY WITH FLUORO;  Surgeon: Marshell Garfinkel, MD;  Location: [facility];  Service: Cardiopulmonary;  Laterality: Bilateral;  . VIDEO BRONCHOSCOPY WITH ENDOBRONCHIAL ULTRASOUND N/A 04/06/2016   Procedure: VIDEO BRONCHOSCOPY WITH ENDOBRONCHIAL ULTRASOUND;  Surgeon: Collene Gobble, MD;  Location: [facility];  Service: Thoracic;  Laterality: N/A;  . WRIST FRACTURE SURGERY      REVIEW OF SYSTEMS:  A comprehensive review of systems was negative except for: Musculoskeletal: positive for arthralgias   PHYSICAL EXAMINATION: General appearance: alert, cooperative and no distress Head: Normocephalic, without obvious abnormality, atraumatic Neck: no adenopathy, no JVD, supple, symmetrical, trachea midline and thyroid not enlarged, symmetric, no tenderness/mass/nodules Lymph nodes: Cervical, supraclavicular, and axillary nodes normal. Resp: clear to auscultation bilaterally Back: symmetric, no curvature. ROM normal. No CVA tenderness. Cardio: regular rate and rhythm, S1, S2 normal, no murmur, click, rub or gallop GI: soft, non-tender; bowel sounds normal; no masses,  no organomegaly Extremities: extremities normal, atraumatic, no cyanosis or edema  ECOG PERFORMANCE STATUS: 1 - Symptomatic but completely ambulatory  Blood pressure (!) 85/63, pulse 90, temperature 98.5 F (36.9 C), temperature source Oral, resp. rate 17, height 5' 7.5" (1.715 m), weight 199 lb 8 oz (90.5 kg), SpO2 97 %.  LABORATORY DATA: Lab Results  Component Value Date   WBC 5.2 12/29/2016   HGB 15.0 12/29/2016   HCT  45.4 12/29/2016   MCV 94.6 12/29/2016   PLT 207 12/29/2016      Chemistry      Component Value Date/Time   NA 140 12/15/2016 0852   K 4.2 12/15/2016 0852   CL 107 04/06/2016 0742   CO2 23 12/15/2016 0852   BUN 11.7 12/15/2016 0852   CREATININE 0.8 12/15/2016 0852      Component Value Date/Time   CALCIUM 9.5 12/15/2016 0852   ALKPHOS 105 12/15/2016 0852   AST 27 12/15/2016 0852   ALT 33 12/15/2016 0852   BILITOT 0.48 12/15/2016 0852       RADIOGRAPHIC STUDIES: No results found.  ASSESSMENT AND PLAN:  This is a very pleasant 51 years old white female with unresectable a stage IIIa non-small cell lung cancer, adenocarcinoma status post a course of concurrent chemoradiation with partial response. The patient is currently undergoing consolidation immunotherapy with Imfinzi (Durvalumab) status post 8 cycles and has been tolerating this treatment well with no significant adverse effects. I recommended for the patient to continue her current treatment and she will proceed with cycle #9 today. Regarding the left shoulder pain, she  will continue with Advil for now and I recommended for the patient to see orthopedic surgery for evaluation. She may need MRI of the left shoulder to rule out any abnormality in this area. I will see her back for follow-up visit in 2 weeks for evaluation before starting the next cycle of her treatment. The patient was advised to call immediately if she has any concerning symptoms in the interval. The patient voices understanding of current disease status and treatment options and is in agreement with the current care plan. All questions were answered. The patient knows to call the clinic with any problems, questions or concerns. We can certainly see the patient much sooner if necessary. I spent 10 minutes counseling the patient face to face. The total time spent in the appointment was 15 minutes.  Disclaimer: This note was dictated with voice recognition  software. Similar sounding words can inadvertently be transcribed and may not be corrected upon review.

## 2016-12-29 NOTE — Patient Instructions (Signed)
[facility] Discharge Instructions for Patients Receiving Chemotherapy  Today you received the following chemotherapy agents: Imfinzi   To help prevent nausea and vomiting after your treatment, we encourage you to take your nausea medication as directed.    If you develop nausea and vomiting that is not controlled by your nausea medication, call the clinic.   BELOW ARE SYMPTOMS THAT SHOULD BE REPORTED IMMEDIATELY:  *FEVER GREATER THAN 100.5 F  *CHILLS WITH OR WITHOUT FEVER  NAUSEA AND VOMITING THAT IS NOT CONTROLLED WITH YOUR NAUSEA MEDICATION  *UNUSUAL SHORTNESS OF BREATH  *UNUSUAL BRUISING OR BLEEDING  TENDERNESS IN MOUTH AND THROAT WITH OR WITHOUT PRESENCE OF ULCERS  *URINARY PROBLEMS  *BOWEL PROBLEMS  UNUSUAL RASH Items with * indicate a potential emergency and should be followed up as soon as possible.  Feel free to call the clinic you have any questions or concerns. The clinic phone number is [phone].  Please show the [facility] at check-in to the Emergency Department and triage nurse.

## 2016-12-29 NOTE — Telephone Encounter (Signed)
Patient already on schedule q2weeks as requested per 8/2 los

## 2017-01-19 ENCOUNTER — Encounter: Payer: Self-pay | Admitting: Internal Medicine

## 2017-01-19 ENCOUNTER — Ambulatory Visit (HOSPITAL_BASED_OUTPATIENT_CLINIC_OR_DEPARTMENT_OTHER): Payer: Managed Care, Other (non HMO)

## 2017-01-19 ENCOUNTER — Ambulatory Visit (HOSPITAL_BASED_OUTPATIENT_CLINIC_OR_DEPARTMENT_OTHER): Payer: Managed Care, Other (non HMO) | Admitting: Internal Medicine

## 2017-01-19 ENCOUNTER — Other Ambulatory Visit (HOSPITAL_BASED_OUTPATIENT_CLINIC_OR_DEPARTMENT_OTHER): Payer: Managed Care, Other (non HMO)

## 2017-01-19 ENCOUNTER — Telehealth: Payer: Self-pay | Admitting: Internal Medicine

## 2017-01-19 VITALS — BP 132/74 | HR 86 | Temp 98.3°F | Resp 18 | Ht 67.5 in | Wt 201.3 lb

## 2017-01-19 DIAGNOSIS — Z5112 Encounter for antineoplastic immunotherapy: Secondary | ICD-10-CM | POA: Diagnosis not present

## 2017-01-19 DIAGNOSIS — Z79899 Other long term (current) drug therapy: Secondary | ICD-10-CM

## 2017-01-19 DIAGNOSIS — C3412 Malignant neoplasm of upper lobe, left bronchus or lung: Secondary | ICD-10-CM

## 2017-01-19 DIAGNOSIS — C3492 Malignant neoplasm of unspecified part of left bronchus or lung: Secondary | ICD-10-CM

## 2017-01-19 LAB — TSH: TSH: 1.258 m[IU]/L (ref 0.308–3.960)

## 2017-01-19 LAB — COMPREHENSIVE METABOLIC PANEL
ALBUMIN: 3.9 g/dL (ref 3.5–5.0)
ALK PHOS: 117 U/L (ref 40–150)
ALT: 25 U/L (ref 0–55)
AST: 24 U/L (ref 5–34)
Anion Gap: 9 mEq/L (ref 3–11)
BILIRUBIN TOTAL: 0.45 mg/dL (ref 0.20–1.20)
BUN: 7.9 mg/dL (ref 7.0–26.0)
CO2: 24 meq/L (ref 22–29)
Calcium: 9.7 mg/dL (ref 8.4–10.4)
Chloride: 107 mEq/L (ref 98–109)
Creatinine: 0.7 mg/dL (ref 0.6–1.1)
EGFR: >90 mL/min/{1.73_m2} (ref >90)
GLUCOSE: 91 mg/dL (ref 70–140)
Potassium: 4.2 mEq/L (ref 3.5–5.1)
SODIUM: 140 meq/L (ref 136–145)
TOTAL PROTEIN: 7.4 g/dL (ref 6.4–8.3)

## 2017-01-19 LAB — CBC WITH DIFFERENTIAL/PLATELET
BASO%: 1.2 % (ref 0.0–2.0)
Basophils Absolute: 0 10*3/uL (ref 0.0–0.1)
EOS ABS: 0.1 10*3/uL (ref 0.0–0.5)
EOS%: 2.7 % (ref 0.0–7.0)
HCT: 44.1 % (ref 34.8–46.6)
HEMOGLOBIN: 14.9 g/dL (ref 11.6–15.9)
LYMPH%: 27.7 % (ref 14.0–49.7)
MCH: 31.2 pg (ref 25.1–34.0)
MCHC: 33.8 g/dL (ref 31.5–36.0)
MCV: 92.3 fL (ref 79.5–101.0)
MONO#: 0.4 10*3/uL (ref 0.1–0.9)
MONO%: 9.3 % (ref 0.0–14.0)
NEUT%: 59.1 % (ref 38.4–76.8)
NEUTROS ABS: 2.5 10*3/uL (ref 1.5–6.5)
Platelets: 205 10*3/uL (ref 145–400)
RBC: 4.78 10*6/uL (ref 3.70–5.45)
RDW: 13.2 % (ref 11.2–14.5)
WBC: 4.3 10*3/uL (ref 3.9–10.3)
lymph#: 1.2 10*3/uL (ref 0.9–3.3)

## 2017-01-19 MED ORDER — SODIUM CHLORIDE 0.9 % IV SOLN
10.0000 mg/kg | Freq: Once | INTRAVENOUS | Status: AC
  Administered 2017-01-19: 860 mg via INTRAVENOUS
  Filled 2017-01-19: qty 10

## 2017-01-19 MED ORDER — SODIUM CHLORIDE 0.9 % IV SOLN
Freq: Once | INTRAVENOUS | Status: AC
  Administered 2017-01-19: 09:00:00 via INTRAVENOUS

## 2017-01-19 NOTE — Telephone Encounter (Signed)
Spoke with patient regarding the timing of her appts.   Had to move appts to 9/26 instead of 9/27 because MM and [facility] are going to be out of the office.   Infusion nurses are going to be printing out an update calendar and avs.

## 2017-01-19 NOTE — Progress Notes (Signed)
[name]:(336) [phone]   Fax:(336) [phone]  OFFICE PROGRESS NOTE  Hulan Fess, MD [place] [address]  DIAGNOSIS: Stage IIIA (T2a, N2, M0) non-small cell lung cancer, adenocarcinoma presented with left suprahilar mass, mediastinal lymphadenopathy and suspicious pulmonary nodule in the left upper lobe diagnosed in November 2017. No actionable mutations on Guardant 360 testing.  PRIOR THERAPY: Status post concurrent chemoradiation with weekly carboplatin for AUC of 2 and paclitaxel 45 MG/M2 for 7 cycles. Last dose was given 06/13/2016.  CURRENT THERAPY: Immunotherapy with Imfinzi (Durvalumab) 10 MG/KG every 2 weeks. First dose 08/11/2016. Status post 9 cycles.  INTERVAL HISTORY: Debbie Baker 51 y.o. female returns to the clinic today for follow-up visit. The patient is feeling fine today with no specific complaints except for aching pain on the left shoulder. She denied having any chest pain, shortness of breath, cough or hemoptysis. She has no nausea, vomiting, diarrhea or constipation. She has no fever or chills. Her treatment was delayed from last week because she was visiting family in [place]. She is here today for evaluation before starting cycle #10.  MEDICAL HISTORY: Past Medical History:  Diagnosis Date  . Allergic rhinitis   . Deaf, left   . Diverticulosis   . Dyspnea   . Encounter for antineoplastic chemotherapy 04/18/2016  . Encounter for antineoplastic immunotherapy 07/21/2016  . GERD (gastroesophageal reflux disease)   . Hyperlipidemia   . Lung cancer ([facility])    non small cell lung cancer favoring adenocarcinoma   . Lung nodules   . Migraine   . Odynophagia 05/24/2016  . Pneumonia   . PONV (postoperative nausea and vomiting)   . Tachycardia, paroxysmal ([facility]) 09/08/2016  . Wears glasses     ALLERGIES:  is allergic to penicillins; chantix [varenicline]; and levaquin [levofloxacin].  MEDICATIONS:  Current  Outpatient Prescriptions  Medication Sig Dispense Refill  . albuterol (PROVENTIL HFA;VENTOLIN HFA) 108 (90 Base) MCG/ACT inhaler Inhale 2 puffs into the lungs every 6 (six) hours as needed for wheezing or shortness of breath. 1 Inhaler 0  . benzonatate (TESSALON) 100 MG capsule Take 1 capsule (100 mg total) by mouth every 4 (four) hours as needed for cough. 60 capsule 1  . Calcium-Magnesium-Vitamin D 185-50-100 MG-MG-UNIT CAPS Take 1 tablet by mouth every evening.    . cetirizine (ZYRTEC) 10 MG tablet Take 10 mg by mouth daily.    . diphenhydrAMINE (BENADRYL) 25 mg capsule Take 25 mg by mouth at bedtime as needed.    . durvalumab (IMFINZI) 120 MG/2.4ML SOLN injection Inject 120 mg into the vein every 14 (fourteen) days.     Nyoka Cowden Tea, Camillia sinensis, (GREEN TEA EXTRACT PO) Take by mouth.    . Lactobacillus (PROBIOTIC ACIDOPHILUS) CAPS Take by mouth.    . MELATONIN GUMMIES PO Take by mouth.    . Milk Thistle 1000 MG CAPS Take 1 capsule by mouth daily.    . Misc Natural Products (BLACK COHOSH MENOPAUSE COMPLEX) TABS Take 1 capsule by mouth 2 (two) times daily.    . Misc Natural Products (TURMERIC CURCUMIN) CAPS Take 1 capsule by mouth every evening. 1500 mg capsule    . Multiple Vitamins-Minerals (WOMENS 50+ MULTI VITAMIN/MIN) TABS Take 1 tablet by mouth daily.    . naproxen sodium (ANAPROX) 220 MG tablet Take 220-440 mg by mouth 2 (two) times daily as needed (for pain).     Marland Kitchen OVER THE COUNTER MEDICATION Take 250 mg by mouth  daily. Jiaogulan Supplement    . pantoprazole (PROTONIX) 40 MG tablet Take 1 tablet (40 mg total) by mouth every evening. 90 tablet 3  . pravastatin (PRAVACHOL) 20 MG tablet Take 20 mg by mouth every evening.  3  . prochlorperazine (COMPAZINE) 10 MG tablet Take 1 tablet (10 mg total) by mouth every 6 (six) hours as needed for nausea or vomiting. (Patient not taking: Reported on 12/29/2016) 30 tablet 0  . ranitidine (ZANTAC) 150 MG tablet Take 150-300 mg by mouth every  evening. 300 mg with more acidic meals    . sucralfate (CARAFATE) 1 g tablet Take 1 tablet (1 g total) by mouth 4 (four) times daily -  with meals and at bedtime. 5 min before meals for radiation induced esophagitis 120 tablet 2   No current facility-administered medications for this visit.     SURGICAL HISTORY:  Past Surgical History:  Procedure Laterality Date  . ABDOMINAL HYSTERECTOMY    . VIDEO BRONCHOSCOPY Bilateral 07/03/2015   Procedure: VIDEO BRONCHOSCOPY WITH FLUORO;  Surgeon: Marshell Garfinkel, MD;  Location: [facility];  Service: Cardiopulmonary;  Laterality: Bilateral;  . VIDEO BRONCHOSCOPY WITH ENDOBRONCHIAL ULTRASOUND N/A 04/06/2016   Procedure: VIDEO BRONCHOSCOPY WITH ENDOBRONCHIAL ULTRASOUND;  Surgeon: Collene Gobble, MD;  Location: [facility];  Service: Thoracic;  Laterality: N/A;  . WRIST FRACTURE SURGERY      REVIEW OF SYSTEMS:  A comprehensive review of systems was negative except for: Musculoskeletal: positive for arthralgias   PHYSICAL EXAMINATION: General appearance: alert, cooperative and no distress Head: Normocephalic, without obvious abnormality, atraumatic Neck: no adenopathy, no JVD, supple, symmetrical, trachea midline and thyroid not enlarged, symmetric, no tenderness/mass/nodules Lymph nodes: Cervical, supraclavicular, and axillary nodes normal. Resp: clear to auscultation bilaterally Back: symmetric, no curvature. ROM normal. No CVA tenderness. Cardio: regular rate and rhythm, S1, S2 normal, no murmur, click, rub or gallop GI: soft, non-tender; bowel sounds normal; no masses,  no organomegaly Extremities: extremities normal, atraumatic, no cyanosis or edema  ECOG PERFORMANCE STATUS: 1 - Symptomatic but completely ambulatory  Blood pressure 132/74, pulse 86, temperature 98.3 F (36.8 C), temperature source Oral, resp. rate 18, height 5' 7.5" (1.715 m), weight 201 lb 4.8 oz (91.3 kg), SpO2 98 %.  LABORATORY DATA: Lab Results  Component Value Date   WBC 4.3  01/19/2017   HGB 14.9 01/19/2017   HCT 44.1 01/19/2017   MCV 92.3 01/19/2017   PLT 205 01/19/2017      Chemistry      Component Value Date/Time   NA 141 12/29/2016 0759   K 4.3 12/29/2016 0759   CL 107 04/06/2016 0742   CO2 25 12/29/2016 0759   BUN 13.2 12/29/2016 0759   CREATININE 0.8 12/29/2016 0759      Component Value Date/Time   CALCIUM 9.7 12/29/2016 0759   ALKPHOS 102 12/29/2016 0759   AST 29 12/29/2016 0759   ALT 33 12/29/2016 0759   BILITOT 0.52 12/29/2016 0759       RADIOGRAPHIC STUDIES: No results found.  ASSESSMENT AND PLAN:  This is a very pleasant 51 years old white female with unresectable a stage IIIa non-small cell lung cancer, adenocarcinoma status post a course of concurrent chemoradiation with partial response. The patient is currently undergoing consolidation immunotherapy with Imfinzi (Durvalumab) status post 9 cycles. She has been tolerating her treatment fairly well with no significant adverse effects. I recommended for her to proceed with cycle #10 today as a scheduled. I will see her back for follow-up visit in  2 weeks for evaluation and repeat CT scan of the chest before the next dose of her treatment. She was advised to call immediately if she has any concerning symptoms in the interval. The patient voices understanding of current disease status and treatment options and is in agreement with the current care plan. All questions were answered. The patient knows to call the clinic with any problems, questions or concerns. We can certainly see the patient much sooner if necessary. I spent 10 minutes counseling the patient face to face. The total time spent in the appointment was 15 minutes.  Disclaimer: This note was dictated with voice recognition software. Similar sounding words can inadvertently be transcribed and may not be corrected upon review.

## 2017-01-19 NOTE — Patient Instructions (Signed)
[facility] Discharge Instructions for Patients Receiving Chemotherapy  Today you received the following chemotherapy agents: Imfinzi   To help prevent nausea and vomiting after your treatment, we encourage you to take your nausea medication as directed.    If you develop nausea and vomiting that is not controlled by your nausea medication, call the clinic.   BELOW ARE SYMPTOMS THAT SHOULD BE REPORTED IMMEDIATELY:  *FEVER GREATER THAN 100.5 F  *CHILLS WITH OR WITHOUT FEVER  NAUSEA AND VOMITING THAT IS NOT CONTROLLED WITH YOUR NAUSEA MEDICATION  *UNUSUAL SHORTNESS OF BREATH  *UNUSUAL BRUISING OR BLEEDING  TENDERNESS IN MOUTH AND THROAT WITH OR WITHOUT PRESENCE OF ULCERS  *URINARY PROBLEMS  *BOWEL PROBLEMS  UNUSUAL RASH Items with * indicate a potential emergency and should be followed up as soon as possible.  Feel free to call the clinic you have any questions or concerns. The clinic phone number is [phone].  Please show the [facility] at check-in to the Emergency Department and triage nurse.

## 2017-01-20 ENCOUNTER — Telehealth: Payer: Self-pay | Admitting: Medical Oncology

## 2017-01-20 NOTE — Telephone Encounter (Signed)
appts clarified. Message sent to managed care for ct scan

## 2017-01-23 ENCOUNTER — Telehealth: Payer: Self-pay

## 2017-01-23 ENCOUNTER — Other Ambulatory Visit: Payer: Self-pay | Admitting: Gastroenterology

## 2017-01-23 NOTE — Telephone Encounter (Signed)
Call and spoke with patient concerning upcoming appointment for 10/4. Per los

## 2017-01-24 ENCOUNTER — Other Ambulatory Visit: Payer: Self-pay | Admitting: Gastroenterology

## 2017-01-24 MED ORDER — PANTOPRAZOLE SODIUM 40 MG PO TBEC: 40.0000 mg | DELAYED_RELEASE_TABLET | Freq: Every evening | ORAL | 0 refills | Status: DC

## 2017-01-24 NOTE — Telephone Encounter (Signed)
Per pharmacy, rx for pantoprazole was never received previously back in March 2018 (as it looks like was intended per our notes). Rx sent until office visit October 2018. No more refills until after that visit is completed.

## 2017-01-31 ENCOUNTER — Ambulatory Visit (HOSPITAL_COMMUNITY)
Admission: RE | Admit: 2017-01-31 | Discharge: 2017-01-31 | Disposition: A | Discharge status: Home / Self Care | Payer: 59 | Source: Ambulatory Visit | Attending: Internal Medicine | Admitting: Internal Medicine

## 2017-01-31 DIAGNOSIS — J439 Emphysema, unspecified: Secondary | ICD-10-CM | POA: Insufficient documentation

## 2017-01-31 DIAGNOSIS — J90 Pleural effusion, not elsewhere classified: Secondary | ICD-10-CM | POA: Diagnosis not present

## 2017-01-31 DIAGNOSIS — J8410 Pulmonary fibrosis, unspecified: Secondary | ICD-10-CM | POA: Diagnosis not present

## 2017-01-31 DIAGNOSIS — C3492 Malignant neoplasm of unspecified part of left bronchus or lung: Secondary | ICD-10-CM | POA: Diagnosis present

## 2017-01-31 DIAGNOSIS — Z5112 Encounter for antineoplastic immunotherapy: Secondary | ICD-10-CM | POA: Diagnosis not present

## 2017-01-31 DIAGNOSIS — I700 Atherosclerosis of aorta: Secondary | ICD-10-CM | POA: Insufficient documentation

## 2017-01-31 MED ORDER — IOPAMIDOL (ISOVUE-300) INJECTION 61%
INTRAVENOUS | Status: AC
  Administered 2017-01-31: 75 mL via INTRAVENOUS
  Filled 2017-01-31: qty 75

## 2017-02-02 ENCOUNTER — Ambulatory Visit (HOSPITAL_BASED_OUTPATIENT_CLINIC_OR_DEPARTMENT_OTHER): Payer: 59 | Admitting: Internal Medicine

## 2017-02-02 ENCOUNTER — Other Ambulatory Visit (HOSPITAL_BASED_OUTPATIENT_CLINIC_OR_DEPARTMENT_OTHER): Payer: 59

## 2017-02-02 ENCOUNTER — Ambulatory Visit (HOSPITAL_BASED_OUTPATIENT_CLINIC_OR_DEPARTMENT_OTHER): Payer: 59

## 2017-02-02 ENCOUNTER — Encounter: Payer: Self-pay | Admitting: Internal Medicine

## 2017-02-02 ENCOUNTER — Telehealth: Payer: Self-pay

## 2017-02-02 VITALS — HR 93

## 2017-02-02 VITALS — BP 109/86 | HR 113 | Temp 99.1°F | Resp 18 | Ht 67.5 in | Wt 198.5 lb

## 2017-02-02 DIAGNOSIS — C3412 Malignant neoplasm of upper lobe, left bronchus or lung: Secondary | ICD-10-CM

## 2017-02-02 DIAGNOSIS — Z5112 Encounter for antineoplastic immunotherapy: Secondary | ICD-10-CM | POA: Diagnosis not present

## 2017-02-02 DIAGNOSIS — C3492 Malignant neoplasm of unspecified part of left bronchus or lung: Secondary | ICD-10-CM

## 2017-02-02 LAB — CBC WITH DIFFERENTIAL/PLATELET
BASO%: 0.9 % (ref 0.0–2.0)
Basophils Absolute: 0.1 10*3/uL (ref 0.0–0.1)
EOS%: 2.3 % (ref 0.0–7.0)
Eosinophils Absolute: 0.1 10*3/uL (ref 0.0–0.5)
HCT: 46 % (ref 34.8–46.6)
HEMOGLOBIN: 15.8 g/dL (ref 11.6–15.9)
LYMPH#: 1.6 10*3/uL (ref 0.9–3.3)
LYMPH%: 25.7 % (ref 14.0–49.7)
MCH: 31.4 pg (ref 25.1–34.0)
MCHC: 34.4 g/dL (ref 31.5–36.0)
MCV: 91.2 fL (ref 79.5–101.0)
MONO#: 0.6 10*3/uL (ref 0.1–0.9)
MONO%: 9.8 % (ref 0.0–14.0)
NEUT#: 3.8 10*3/uL (ref 1.5–6.5)
NEUT%: 61.3 % (ref 38.4–76.8)
Platelets: 240 10*3/uL (ref 145–400)
RBC: 5.04 10*6/uL (ref 3.70–5.45)
RDW: 12.9 % (ref 11.2–14.5)
WBC: 6.2 10*3/uL (ref 3.9–10.3)

## 2017-02-02 LAB — COMPREHENSIVE METABOLIC PANEL
ALBUMIN: 4.2 g/dL (ref 3.5–5.0)
ALK PHOS: 98 U/L (ref 40–150)
ALT: 41 U/L (ref 0–55)
AST: 36 U/L — ABNORMAL HIGH (ref 5–34)
Anion Gap: 11 mEq/L (ref 3–11)
BUN: 13.2 mg/dL (ref 7.0–26.0)
CO2: 24 mEq/L (ref 22–29)
Calcium: 10.5 mg/dL — ABNORMAL HIGH (ref 8.4–10.4)
Chloride: 105 mEq/L (ref 98–109)
Creatinine: 0.8 mg/dL (ref 0.6–1.1)
EGFR: 83 mL/min/{1.73_m2} — AB (ref >90)
GLUCOSE: 101 mg/dL (ref 70–140)
POTASSIUM: 4.2 meq/L (ref 3.5–5.1)
SODIUM: 140 meq/L (ref 136–145)
Total Bilirubin: 0.76 mg/dL (ref 0.20–1.20)
Total Protein: 7.9 g/dL (ref 6.4–8.3)

## 2017-02-02 MED ORDER — SODIUM CHLORIDE 0.9 % IV SOLN
Freq: Once | INTRAVENOUS | Status: AC
  Administered 2017-02-02: 11:00:00 via INTRAVENOUS

## 2017-02-02 MED ORDER — DOXYCYCLINE HYCLATE 100 MG PO TABS: 100.0000 mg | ORAL_TABLET | Freq: Two times a day (BID) | ORAL | 0 refills | Status: DC

## 2017-02-02 MED ORDER — SODIUM CHLORIDE 0.9 % IV SOLN
10.0000 mg/kg | Freq: Once | INTRAVENOUS | Status: AC
  Administered 2017-02-02: 860 mg via INTRAVENOUS
  Filled 2017-02-02: qty 10

## 2017-02-02 NOTE — Progress Notes (Signed)
[place] [phone]   [phone]  OFFICE PROGRESS NOTE  Hulan Fess, MD [place] [address]  DIAGNOSIS: Stage IIIA (T2a, N2, M0) non-small cell lung cancer, adenocarcinoma presented with left suprahilar mass, mediastinal lymphadenopathy and suspicious pulmonary nodule in the left upper lobe diagnosed in November 2017. No actionable mutations on Guardant 360 testing.  PRIOR THERAPY: Status post concurrent chemoradiation with weekly carboplatin for AUC of 2 and paclitaxel 45 MG/M2 for 7 cycles. Last dose was given 06/13/2016.  CURRENT THERAPY: Immunotherapy with Imfinzi (Durvalumab) 10 MG/KG every 2 weeks. First dose 08/11/2016. Status post 10 cycles.  INTERVAL HISTORY: Debbie Baker 51 y.o. female returns to the clinic today for follow-up visit accompanied by her husband. The patient is feeling fine today except for generalized fatigue and aching pain especially in the shoulder area. She has been tolerating her treatment with Imfinzi (Durvalumab) fairly well. She denied having any current chest pain but has occasional shortness of breath with exertion with no significant cough or hemoptysis. She denied having any fever or chills. The patient has no weight loss or night sweats. She has no nausea, vomiting, diarrhea or constipation. She had repeat CT scan of the chest performed recently and she is here for evaluation and discussion of her scan results.  MEDICAL HISTORY: Past Medical History:  Diagnosis Date  . Allergic rhinitis   . Deaf, left   . Diverticulosis   . Dyspnea   . Encounter for antineoplastic chemotherapy 04/18/2016  . Encounter for antineoplastic immunotherapy 07/21/2016  . GERD (gastroesophageal reflux disease)   . Hyperlipidemia   . Lung cancer ([facility])    non small cell lung cancer favoring adenocarcinoma   . Lung nodules   . Migraine   . Odynophagia 05/24/2016  . Pneumonia   . PONV (postoperative nausea  and vomiting)   . Tachycardia, paroxysmal ([facility]) 09/08/2016  . Wears glasses     ALLERGIES:  is allergic to penicillins; chantix [varenicline]; and levaquin [levofloxacin].  MEDICATIONS:  Current Outpatient Prescriptions  Medication Sig Dispense Refill  . albuterol (PROVENTIL HFA;VENTOLIN HFA) 108 (90 Base) MCG/ACT inhaler Inhale 2 puffs into the lungs every 6 (six) hours as needed for wheezing or shortness of breath. 1 Inhaler 0  . benzonatate (TESSALON) 100 MG capsule Take 1 capsule (100 mg total) by mouth every 4 (four) hours as needed for cough. 60 capsule 1  . Calcium-Magnesium-Vitamin D 185-50-100 MG-MG-UNIT CAPS Take 1 tablet by mouth every evening.    . cetirizine (ZYRTEC) 10 MG tablet Take 10 mg by mouth daily.    . diphenhydrAMINE (BENADRYL) 25 mg capsule Take 25 mg by mouth at bedtime as needed.    . durvalumab (IMFINZI) 120 MG/2.4ML SOLN injection Inject 120 mg into the vein every 14 (fourteen) days.     Nyoka Cowden Tea, Camillia sinensis, (GREEN TEA EXTRACT PO) Take by mouth.    . Lactobacillus (PROBIOTIC ACIDOPHILUS) CAPS Take by mouth.    . MELATONIN GUMMIES PO Take by mouth.    . Milk Thistle 1000 MG CAPS Take 1 capsule by mouth daily.    . Misc Natural Products (BLACK COHOSH MENOPAUSE COMPLEX) TABS Take 1 capsule by mouth 2 (two) times daily.    . Misc Natural Products (TURMERIC CURCUMIN) CAPS Take 1 capsule by mouth every evening. 1500 mg capsule    . Multiple Vitamins-Minerals (WOMENS 50+ MULTI VITAMIN/MIN) TABS Take 1 tablet by mouth daily.    . naproxen  sodium (ANAPROX) 220 MG tablet Take 220-440 mg by mouth 2 (two) times daily as needed (for pain).     Marland Kitchen OVER THE COUNTER MEDICATION Take 250 mg by mouth daily. Jiaogulan Supplement    . pantoprazole (PROTONIX) 40 MG tablet Take 1 tablet (40 mg total) by mouth every evening. 90 tablet 0  . pravastatin (PRAVACHOL) 20 MG tablet Take 20 mg by mouth every evening.  3  . ranitidine (ZANTAC) 150 MG tablet Take 150-300 mg by mouth  every evening. 300 mg with more acidic meals    . prochlorperazine (COMPAZINE) 10 MG tablet Take 1 tablet (10 mg total) by mouth every 6 (six) hours as needed for nausea or vomiting. (Patient not taking: Reported on 02/02/2017) 30 tablet 0   No current facility-administered medications for this visit.     SURGICAL HISTORY:  Past Surgical History:  Procedure Laterality Date  . ABDOMINAL HYSTERECTOMY    . VIDEO BRONCHOSCOPY Bilateral 07/03/2015   Procedure: VIDEO BRONCHOSCOPY WITH FLUORO;  Surgeon: Marshell Garfinkel, MD;  Location: [facility];  Service: Cardiopulmonary;  Laterality: Bilateral;  . VIDEO BRONCHOSCOPY WITH ENDOBRONCHIAL ULTRASOUND N/A 04/06/2016   Procedure: VIDEO BRONCHOSCOPY WITH ENDOBRONCHIAL ULTRASOUND;  Surgeon: Collene Gobble, MD;  Location: [facility];  Service: Thoracic;  Laterality: N/A;  . WRIST FRACTURE SURGERY      REVIEW OF SYSTEMS:  Constitutional: positive for fatigue Eyes: negative Ears, nose, mouth, throat, and face: negative Respiratory: positive for dyspnea on exertion Cardiovascular: negative Gastrointestinal: negative Genitourinary:negative Integument/breast: negative Hematologic/lymphatic: negative Musculoskeletal:positive for arthralgias and myalgias Neurological: negative Behavioral/Psych: negative Endocrine: negative Allergic/Immunologic: negative   PHYSICAL EXAMINATION: General appearance: alert, cooperative, fatigued and no distress Head: Normocephalic, without obvious abnormality, atraumatic Neck: no adenopathy, no JVD, supple, symmetrical, trachea midline and thyroid not enlarged, symmetric, no tenderness/mass/nodules Lymph nodes: Cervical, supraclavicular, and axillary nodes normal. Resp: clear to auscultation bilaterally Back: symmetric, no curvature. ROM normal. No CVA tenderness. Cardio: regular rate and rhythm, S1, S2 normal, no murmur, click, rub or gallop GI: soft, non-tender; bowel sounds normal; no masses,  no organomegaly Extremities:  extremities normal, atraumatic, no cyanosis or edema Neurologic: Alert and oriented X 3, normal strength and tone. Normal symmetric reflexes. Normal coordination and gait  ECOG PERFORMANCE STATUS: 1 - Symptomatic but completely ambulatory  Blood pressure 109/86, pulse (!) 113, temperature 99.1 F (37.3 C), resp. rate 18, height 5' 7.5" (1.715 m), weight 198 lb 8 oz (90 kg), SpO2 97 %.  LABORATORY DATA: Lab Results  Component Value Date   WBC 6.2 02/02/2017   HGB 15.8 02/02/2017   HCT 46.0 02/02/2017   MCV 91.2 02/02/2017   PLT 240 02/02/2017      Chemistry      Component Value Date/Time   NA 140 02/02/2017 0855   K 4.2 02/02/2017 0855   CL 107 04/06/2016 0742   CO2 24 02/02/2017 0855   BUN 13.2 02/02/2017 0855   CREATININE 0.8 02/02/2017 0855      Component Value Date/Time   CALCIUM 10.5 (H) 02/02/2017 0855   ALKPHOS 98 02/02/2017 0855   AST 36 (H) 02/02/2017 0855   ALT 41 02/02/2017 0855   BILITOT 0.76 02/02/2017 0855       RADIOGRAPHIC STUDIES: Ct Chest W Contrast  Result Date: 02/01/2017 CLINICAL DATA:  Stage III lung cancer. EXAM: CT CHEST WITH CONTRAST TECHNIQUE: Multidetector CT imaging of the chest was performed during intravenous contrast administration. CONTRAST:  <See Chart> ISOVUE-300 IOPAMIDOL (ISOVUE-300) INJECTION 61% COMPARISON:  11/15/2016 FINDINGS:  Cardiovascular: The heart size is normal. No pericardial effusion identified. Mediastinum/Nodes: The trachea appears patent and is midline. Normal appearance of the esophagus. Focal area of increased soft tissue within the lower anterior mediastinum near the pericardium measures 1.5 x 1.0 cm, image 80 of series 2. Previously 1.1 x 0.6 cm. Confluent left paratracheal soft tissue measures 1.1 cm, image 54 of series 2. Unchanged from previous exam. The increased soft tissue within the anterior left hilar region measures 1.7 cm, image 57 of series 2. Previously 1.5 cm. Pre-vascular node within the upper mediastinum  measures 8 mm, image 38 of series 2. Unchanged from previous exam. Lungs/Pleura: Small left pleural effusion is similar to previous exam. Mild centrilobular emphysema. Confluent masslike architectural distortion within the paramediastinal left lung compatible with changes of external beam radiation. Index nodule within the left upper lobe measures 7 mm, image 35 of series 7. Previously 5 mm. Stable index 3 mm right lower lobe pulmonary nodule, image 84 of series 7. 6 mm subpleural nodule is identified along the posteromedial right lower lobe, image 92 of series 7. Unchanged. 5 mm subpleural nodule in the posterior right lung base is noted, image 116 of series 7. Previously 4 mm. Nodular density within the lingula measures 7 mm, image 74 of series 7. Previously this measured the same. Upper Abdomen: No acute abnormality. Musculoskeletal: No chest wall abnormality. No acute or significant osseous findings. IMPRESSION: 1. There has been mild interval increase in small nodular soft tissue focus in the left anterior mediastinum near the pericardium. Cannot rule out metastatic lymph node. Attention to this area on follow-up imaging is advised. 2. Stable appearance of masslike fibrosis in the left perihilar region compatible with changes of external beam radiation. 3. Stable appearance of infiltrative soft tissue in the anterior left hilum and left lower paratracheal mediastinum. 4. Scattered bilateral pulmonary nodularity is stable. 5. Stable small left pleural effusion 6. Aortic Atherosclerosis (ICD10-I70.0) and Emphysema (ICD10-J43.9). Electronically Signed   By: Kerby Moors M.D.   On: 02/01/2017 08:43    ASSESSMENT AND PLAN:  This is a very pleasant 51 years old white female with unresectable a stage IIIA non-small cell lung cancer, adenocarcinoma status post a course of concurrent chemoradiation with partial response. The patient is currently undergoing consolidation immunotherapy with Imfinzi (Durvalumab)  status post 10 cycles. She had recent CT scan of the chest. I personally and independently reviewed the scan images and discuss the results with the patient and her husband and showed them the images. Her scan showed stable disease except for mild interval increase in his small nodular soft tissue focus in the left anterior mediastinum near the pericardium. This still suspicious for metastatic lymph node and would require attention on upcoming scans. I recommended for the patient to continue her current treatment with Imfinzi (Durvalumab) for now. I will see her back for follow-up visit in 2 weeks for reevaluation before the next cycle of her treatment. For the patient cough and shortness of breath as well as chest pain, I started the patient empirically on doxycycline 100 mg by mouth twice a day. The patient was advised to call immediately if she has any concerning symptoms in the interval. The patient voices understanding of current disease status and treatment options and is in agreement with the current care plan. All questions were answered. The patient knows to call the clinic with any problems, questions or concerns. We can certainly see the patient much sooner if necessary.  Disclaimer: This note was  dictated with voice recognition software. Similar sounding words can inadvertently be transcribed and may not be corrected upon review.       

## 2017-02-02 NOTE — Telephone Encounter (Signed)
Gave patient avs and calender for 10/18, and 11/1 los

## 2017-02-02 NOTE — Patient Instructions (Signed)
[facility] Discharge Instructions for Patients Receiving Chemotherapy  Today you received the following chemotherapy agents: Imfinzi   To help prevent nausea and vomiting after your treatment, we encourage you to take your nausea medication as directed.    If you develop nausea and vomiting that is not controlled by your nausea medication, call the clinic.   BELOW ARE SYMPTOMS THAT SHOULD BE REPORTED IMMEDIATELY:  *FEVER GREATER THAN 100.5 F  *CHILLS WITH OR WITHOUT FEVER  NAUSEA AND VOMITING THAT IS NOT CONTROLLED WITH YOUR NAUSEA MEDICATION  *UNUSUAL SHORTNESS OF BREATH  *UNUSUAL BRUISING OR BLEEDING  TENDERNESS IN MOUTH AND THROAT WITH OR WITHOUT PRESENCE OF ULCERS  *URINARY PROBLEMS  *BOWEL PROBLEMS  UNUSUAL RASH Items with * indicate a potential emergency and should be followed up as soon as possible.  Feel free to call the clinic you have any questions or concerns. The clinic phone number is [phone].  Please show the [facility] at check-in to the Emergency Department and triage nurse.

## 2017-02-16 ENCOUNTER — Encounter: Payer: Self-pay | Admitting: Internal Medicine

## 2017-02-16 ENCOUNTER — Telehealth: Payer: Self-pay | Admitting: Internal Medicine

## 2017-02-16 ENCOUNTER — Ambulatory Visit (HOSPITAL_BASED_OUTPATIENT_CLINIC_OR_DEPARTMENT_OTHER): Payer: 59 | Admitting: Internal Medicine

## 2017-02-16 ENCOUNTER — Other Ambulatory Visit (HOSPITAL_BASED_OUTPATIENT_CLINIC_OR_DEPARTMENT_OTHER): Payer: 59

## 2017-02-16 ENCOUNTER — Ambulatory Visit (HOSPITAL_BASED_OUTPATIENT_CLINIC_OR_DEPARTMENT_OTHER): Payer: 59

## 2017-02-16 VITALS — BP 102/75 | HR 105 | Temp 98.3°F | Resp 17 | Wt 196.3 lb

## 2017-02-16 DIAGNOSIS — C3412 Malignant neoplasm of upper lobe, left bronchus or lung: Secondary | ICD-10-CM | POA: Diagnosis not present

## 2017-02-16 DIAGNOSIS — R0602 Shortness of breath: Secondary | ICD-10-CM | POA: Diagnosis not present

## 2017-02-16 DIAGNOSIS — Z79899 Other long term (current) drug therapy: Secondary | ICD-10-CM

## 2017-02-16 DIAGNOSIS — C3492 Malignant neoplasm of unspecified part of left bronchus or lung: Secondary | ICD-10-CM

## 2017-02-16 DIAGNOSIS — Z5112 Encounter for antineoplastic immunotherapy: Secondary | ICD-10-CM

## 2017-02-16 LAB — COMPREHENSIVE METABOLIC PANEL
ALBUMIN: 4.3 g/dL (ref 3.5–5.0)
ALK PHOS: 94 U/L (ref 40–150)
ALT: 25 U/L (ref 0–55)
ANION GAP: 10 meq/L (ref 3–11)
AST: 25 U/L (ref 5–34)
BUN: 14.5 mg/dL (ref 7.0–26.0)
CHLORIDE: 106 meq/L (ref 98–109)
CO2: 24 mEq/L (ref 22–29)
Calcium: 10.1 mg/dL (ref 8.4–10.4)
Creatinine: 0.8 mg/dL (ref 0.6–1.1)
EGFR: 86 mL/min/{1.73_m2} — AB (ref >90)
Glucose: 102 mg/dl (ref 70–140)
POTASSIUM: 4.3 meq/L (ref 3.5–5.1)
Sodium: 139 mEq/L (ref 136–145)
Total Bilirubin: 0.58 mg/dL (ref 0.20–1.20)
Total Protein: 7.8 g/dL (ref 6.4–8.3)

## 2017-02-16 LAB — CBC WITH DIFFERENTIAL/PLATELET
BASO%: 0.4 % (ref 0.0–2.0)
BASOS ABS: 0 10*3/uL (ref 0.0–0.1)
EOS ABS: 0.1 10*3/uL (ref 0.0–0.5)
EOS%: 2.4 % (ref 0.0–7.0)
HCT: 44.3 % (ref 34.8–46.6)
HGB: 15 g/dL (ref 11.6–15.9)
LYMPH#: 1.4 10*3/uL (ref 0.9–3.3)
LYMPH%: 30.5 % (ref 14.0–49.7)
MCH: 31.6 pg (ref 25.1–34.0)
MCHC: 33.9 g/dL (ref 31.5–36.0)
MCV: 93.3 fL (ref 79.5–101.0)
MONO#: 0.5 10*3/uL (ref 0.1–0.9)
MONO%: 10.7 % (ref 0.0–14.0)
NEUT#: 2.6 10*3/uL (ref 1.5–6.5)
NEUT%: 56 % (ref 38.4–76.8)
PLATELETS: 248 10*3/uL (ref 145–400)
RBC: 4.75 10*6/uL (ref 3.70–5.45)
RDW: 12.4 % (ref 11.2–14.5)
WBC: 4.7 10*3/uL (ref 3.9–10.3)

## 2017-02-16 LAB — TSH: TSH: 1.735 m(IU)/L (ref 0.308–3.960)

## 2017-02-16 MED ORDER — SODIUM CHLORIDE 0.9 % IV SOLN
Freq: Once | INTRAVENOUS | Status: AC
  Administered 2017-02-16: 11:00:00 via INTRAVENOUS

## 2017-02-16 MED ORDER — DURVALUMAB 500 MG/10ML IV SOLN
10.0000 mg/kg | Freq: Once | INTRAVENOUS | Status: AC
  Administered 2017-02-16: 860 mg via INTRAVENOUS
  Filled 2017-02-16: qty 10

## 2017-02-16 NOTE — Progress Notes (Signed)
[facility] Telephone:([phone]   Fax:([phone]  OFFICE PROGRESS NOTE  Hulan Fess, MD [place] [address]  DIAGNOSIS: Stage IIIA (T2a, N2, M0) non-small cell lung cancer, adenocarcinoma presented with left suprahilar mass, mediastinal lymphadenopathy and suspicious pulmonary nodule in the left upper lobe diagnosed in November 2017. No actionable mutations on Guardant 360 testing.  PRIOR THERAPY: Status post concurrent chemoradiation with weekly carboplatin for AUC of 2 and paclitaxel 45 MG/M2 for 7 cycles. Last dose was given 06/13/2016.  CURRENT THERAPY: Immunotherapy with Imfinzi (Durvalumab) 10 MG/KG every 2 weeks. First dose 08/11/2016. Status post 11 cycles.  INTERVAL HISTORY: Debbie Baker 51 y.o. female returns to the clinic for follow-up visit.  The patient has no complaints today except for  fatigue and abdominal distention. She denied having any chest pain but continues to have shortness of breath with exertion as well as wheezes.  The patient lost few pounds since her last visit. She has no hemoptysis. She denied having any nausea , vomiting , diarrhea or constipation.she has no fever or chills. She continues to tolerate her treatment fairly well. She is here today for evaluation before starting cycle #12.  MEDICAL HISTORY: Past Medical History:  Diagnosis Date  . Allergic rhinitis   . Deaf, left   . Diverticulosis   . Dyspnea   . Encounter for antineoplastic chemotherapy 04/18/2016  . Encounter for antineoplastic immunotherapy 07/21/2016  . GERD (gastroesophageal reflux disease)   . Hyperlipidemia   . Lung cancer ([place])    non small cell lung cancer favoring adenocarcinoma   . Lung nodules   . Migraine   . Odynophagia 05/24/2016  . Pneumonia   . PONV (postoperative nausea and vomiting)   . Tachycardia, paroxysmal ([facility]) 09/08/2016  . Wears glasses     ALLERGIES:  is allergic to penicillins; chantix [varenicline]; and  levaquin [levofloxacin].  MEDICATIONS:  Current Outpatient Prescriptions  Medication Sig Dispense Refill  . albuterol (PROVENTIL HFA;VENTOLIN HFA) 108 (90 Base) MCG/ACT inhaler Inhale 2 puffs into the lungs every 6 (six) hours as needed for wheezing or shortness of breath. 1 Inhaler 0  . benzonatate (TESSALON) 100 MG capsule Take 1 capsule (100 mg total) by mouth every 4 (four) hours as needed for cough. 60 capsule 1  . Calcium-Magnesium-Vitamin D 185-50-100 MG-MG-UNIT CAPS Take 1 tablet by mouth every evening.    . cetirizine (ZYRTEC) 10 MG tablet Take 10 mg by mouth daily.    . diphenhydrAMINE (BENADRYL) 25 mg capsule Take 25 mg by mouth at bedtime as needed.    . doxycycline (VIBRA-TABS) 100 MG tablet Take 1 tablet (100 mg total) by mouth 2 (two) times daily. 20 tablet 0  . durvalumab (IMFINZI) 120 MG/2.4ML SOLN injection Inject 120 mg into the vein every 14 (fourteen) days.     Nyoka Cowden Tea, Camillia sinensis, (GREEN TEA EXTRACT PO) Take by mouth.    . Lactobacillus (PROBIOTIC ACIDOPHILUS) CAPS Take by mouth.    . MELATONIN GUMMIES PO Take by mouth.    . Milk Thistle 1000 MG CAPS Take 1 capsule by mouth daily.    . Misc Natural Products (BLACK COHOSH MENOPAUSE COMPLEX) TABS Take 1 capsule by mouth 2 (two) times daily.    . Misc Natural Products (TURMERIC CURCUMIN) CAPS Take 1 capsule by mouth every evening. 1500 mg capsule    . Multiple Vitamins-Minerals (WOMENS 50+ MULTI VITAMIN/MIN) TABS Take 1 tablet by mouth daily.    Marland Kitchen  naproxen sodium (ANAPROX) 220 MG tablet Take 220-440 mg by mouth 2 (two) times daily as needed (for pain).     Marland Kitchen OVER THE COUNTER MEDICATION Take 250 mg by mouth daily. Jiaogulan Supplement    . pantoprazole (PROTONIX) 40 MG tablet Take 1 tablet (40 mg total) by mouth every evening. 90 tablet 0  . pravastatin (PRAVACHOL) 20 MG tablet Take 20 mg by mouth every evening.  3  . prochlorperazine (COMPAZINE) 10 MG tablet Take 1 tablet (10 mg total) by mouth every 6 (six) hours  as needed for nausea or vomiting. (Patient not taking: Reported on 02/02/2017) 30 tablet 0  . ranitidine (ZANTAC) 150 MG tablet Take 150-300 mg by mouth every evening. 300 mg with more acidic meals     No current facility-administered medications for this visit.     SURGICAL HISTORY:  Past Surgical History:  Procedure Laterality Date  . ABDOMINAL HYSTERECTOMY    . VIDEO BRONCHOSCOPY Bilateral 07/03/2015   Procedure: VIDEO BRONCHOSCOPY WITH FLUORO;  Surgeon: Marshell Garfinkel, MD;  Location: [facility];  Service: Cardiopulmonary;  Laterality: Bilateral;  . VIDEO BRONCHOSCOPY WITH ENDOBRONCHIAL ULTRASOUND N/A 04/06/2016   Procedure: VIDEO BRONCHOSCOPY WITH ENDOBRONCHIAL ULTRASOUND;  Surgeon: Collene Gobble, MD;  Location: [facility];  Service: Thoracic;  Laterality: N/A;  . WRIST FRACTURE SURGERY      REVIEW OF SYSTEMS:  A comprehensive review of systems was negative except for: Constitutional: positive for fatigue Respiratory: positive for dyspnea on exertion Gastrointestinal: positive for Abdominal distention   PHYSICAL EXAMINATION: General appearance: alert, cooperative, fatigued and no distress Head: Normocephalic, without obvious abnormality, atraumatic Neck: no adenopathy, no JVD, supple, symmetrical, trachea midline and thyroid not enlarged, symmetric, no tenderness/mass/nodules Lymph nodes: Cervical, supraclavicular, and axillary nodes normal. Resp: wheezes bilaterally Back: symmetric, no curvature. ROM normal. No CVA tenderness. Cardio: regular rate and rhythm, S1, S2 normal, no murmur, click, rub or gallop GI: soft, non-tender; bowel sounds normal; no masses,  no organomegaly Extremities: extremities normal, atraumatic, no cyanosis or edema  ECOG PERFORMANCE STATUS: 1 - Symptomatic but completely ambulatory  Blood pressure 102/75, pulse (!) 105, temperature 98.3 F (36.8 C), temperature source Oral, resp. rate 17, weight 196 lb 4.8 oz (89 kg), SpO2 98 %.  LABORATORY DATA: Lab  Results  Component Value Date   WBC 4.7 02/16/2017   HGB 15.0 02/16/2017   HCT 44.3 02/16/2017   MCV 93.3 02/16/2017   PLT 248 02/16/2017      Chemistry      Component Value Date/Time   NA 140 02/02/2017 0855   K 4.2 02/02/2017 0855   CL 107 04/06/2016 0742   CO2 24 02/02/2017 0855   BUN 13.2 02/02/2017 0855   CREATININE 0.8 02/02/2017 0855      Component Value Date/Time   CALCIUM 10.5 (H) 02/02/2017 0855   ALKPHOS 98 02/02/2017 0855   AST 36 (H) 02/02/2017 0855   ALT 41 02/02/2017 0855   BILITOT 0.76 02/02/2017 0855       RADIOGRAPHIC STUDIES: Ct Chest W Contrast  Result Date: 02/01/2017 CLINICAL DATA:  Stage III lung cancer. EXAM: CT CHEST WITH CONTRAST TECHNIQUE: Multidetector CT imaging of the chest was performed during intravenous contrast administration. CONTRAST:  <See Chart> ISOVUE-300 IOPAMIDOL (ISOVUE-300) INJECTION 61% COMPARISON:  11/15/2016 FINDINGS: Cardiovascular: The heart size is normal. No pericardial effusion identified. Mediastinum/Nodes: The trachea appears patent and is midline. Normal appearance of the esophagus. Focal area of increased soft tissue within the lower anterior mediastinum near the pericardium  measures 1.5 x 1.0 cm, image 80 of series 2. Previously 1.1 x 0.6 cm. Confluent left paratracheal soft tissue measures 1.1 cm, image 54 of series 2. Unchanged from previous exam. The increased soft tissue within the anterior left hilar region measures 1.7 cm, image 57 of series 2. Previously 1.5 cm. Pre-vascular node within the upper mediastinum measures 8 mm, image 38 of series 2. Unchanged from previous exam. Lungs/Pleura: Small left pleural effusion is similar to previous exam. Mild centrilobular emphysema. Confluent masslike architectural distortion within the paramediastinal left lung compatible with changes of external beam radiation. Index nodule within the left upper lobe measures 7 mm, image 35 of series 7. Previously 5 mm. Stable index 3 mm right  lower lobe pulmonary nodule, image 84 of series 7. 6 mm subpleural nodule is identified along the posteromedial right lower lobe, image 92 of series 7. Unchanged. 5 mm subpleural nodule in the posterior right lung base is noted, image 116 of series 7. Previously 4 mm. Nodular density within the lingula measures 7 mm, image 74 of series 7. Previously this measured the same. Upper Abdomen: No acute abnormality. Musculoskeletal: No chest wall abnormality. No acute or significant osseous findings. IMPRESSION: 1. There has been mild interval increase in small nodular soft tissue focus in the left anterior mediastinum near the pericardium. Cannot rule out metastatic lymph node. Attention to this area on follow-up imaging is advised. 2. Stable appearance of masslike fibrosis in the left perihilar region compatible with changes of external beam radiation. 3. Stable appearance of infiltrative soft tissue in the anterior left hilum and left lower paratracheal mediastinum. 4. Scattered bilateral pulmonary nodularity is stable. 5. Stable small left pleural effusion 6. Aortic Atherosclerosis (ICD10-I70.0) and Emphysema (ICD10-J43.9). Electronically Signed   By: Kerby Moors M.D.   On: 02/01/2017 08:43    ASSESSMENT AND PLAN:  This is a very pleasant 51 years old white female with unresectable a stage IIIA non-small cell lung cancer, adenocarcinoma status post a course of concurrent chemoradiation with partial response. The patient is currently undergoing consolidation immunotherapy with Imfinzi (Durvalumab) status post 11 cycles.  She tolerated the last cycle of her treatment well. I recommended for the patient to proceed with cycle #12 today as scheduled.  I would see her back for follow-up visit in 2 weeks for evaluation before Cycle #13.  For the shortness of breath and wheezing, I advised the patient to see her pulmonologist for reevaluation and management.  The patient was advised to call immediately if she has  any concerning symptoms in the interval. The patient voices understanding of current disease status and treatment options and is in agreement with the current care plan. All questions were answered. The patient knows to call the clinic with any problems, questions or concerns. We can certainly see the patient much sooner if necessary.  Disclaimer: This note was dictated with voice recognition software. Similar sounding words can inadvertently be transcribed and may not be corrected upon review.

## 2017-02-16 NOTE — Patient Instructions (Signed)
[facility] Discharge Instructions for Patients Receiving Chemotherapy  Today you received the following chemotherapy agents: Imfinzi   To help prevent nausea and vomiting after your treatment, we encourage you to take your nausea medication as directed.    If you develop nausea and vomiting that is not controlled by your nausea medication, call the clinic.   BELOW ARE SYMPTOMS THAT SHOULD BE REPORTED IMMEDIATELY:  *FEVER GREATER THAN 100.5 F  *CHILLS WITH OR WITHOUT FEVER  NAUSEA AND VOMITING THAT IS NOT CONTROLLED WITH YOUR NAUSEA MEDICATION  *UNUSUAL SHORTNESS OF BREATH  *UNUSUAL BRUISING OR BLEEDING  TENDERNESS IN MOUTH AND THROAT WITH OR WITHOUT PRESENCE OF ULCERS  *URINARY PROBLEMS  *BOWEL PROBLEMS  UNUSUAL RASH Items with * indicate a potential emergency and should be followed up as soon as possible.  Feel free to call the clinic you have any questions or concerns. The clinic phone number is [phone].  Please show the [facility] at check-in to the Emergency Department and triage nurse.

## 2017-02-16 NOTE — Telephone Encounter (Signed)
Gave avs and calendar for november

## 2017-02-22 ENCOUNTER — Ambulatory Visit: Payer: Managed Care, Other (non HMO)

## 2017-02-22 ENCOUNTER — Ambulatory Visit: Payer: Managed Care, Other (non HMO) | Admitting: Internal Medicine

## 2017-02-22 ENCOUNTER — Other Ambulatory Visit: Payer: Managed Care, Other (non HMO)

## 2017-03-02 ENCOUNTER — Ambulatory Visit (HOSPITAL_BASED_OUTPATIENT_CLINIC_OR_DEPARTMENT_OTHER): Payer: 59

## 2017-03-02 ENCOUNTER — Ambulatory Visit (HOSPITAL_BASED_OUTPATIENT_CLINIC_OR_DEPARTMENT_OTHER): Payer: 59 | Admitting: Internal Medicine

## 2017-03-02 ENCOUNTER — Encounter: Payer: Self-pay | Admitting: Internal Medicine

## 2017-03-02 ENCOUNTER — Other Ambulatory Visit (HOSPITAL_BASED_OUTPATIENT_CLINIC_OR_DEPARTMENT_OTHER): Payer: 59

## 2017-03-02 VITALS — BP 91/71 | HR 98 | Temp 98.5°F | Resp 18 | Ht 67.5 in | Wt 198.9 lb

## 2017-03-02 DIAGNOSIS — Z5112 Encounter for antineoplastic immunotherapy: Secondary | ICD-10-CM | POA: Diagnosis not present

## 2017-03-02 DIAGNOSIS — C3492 Malignant neoplasm of unspecified part of left bronchus or lung: Secondary | ICD-10-CM

## 2017-03-02 DIAGNOSIS — C3412 Malignant neoplasm of upper lobe, left bronchus or lung: Secondary | ICD-10-CM

## 2017-03-02 LAB — COMPREHENSIVE METABOLIC PANEL
ALBUMIN: 4.1 g/dL (ref 3.5–5.0)
ALK PHOS: 96 U/L (ref 40–150)
ALT: 26 U/L (ref 0–55)
AST: 23 U/L (ref 5–34)
Anion Gap: 10 mEq/L (ref 3–11)
BUN: 9.3 mg/dL (ref 7.0–26.0)
CALCIUM: 9.9 mg/dL (ref 8.4–10.4)
CHLORIDE: 104 meq/L (ref 98–109)
CO2: 24 mEq/L (ref 22–29)
CREATININE: 0.7 mg/dL (ref 0.6–1.1)
EGFR: >90 mL/min/{1.73_m2} (ref >90)
GLUCOSE: 82 mg/dL (ref 70–140)
Potassium: 4.1 mEq/L (ref 3.5–5.1)
Sodium: 138 mEq/L (ref 136–145)
Total Bilirubin: 0.4 mg/dL (ref 0.20–1.20)
Total Protein: 7.3 g/dL (ref 6.4–8.3)

## 2017-03-02 LAB — CBC WITH DIFFERENTIAL/PLATELET
BASO%: 0.4 % (ref 0.0–2.0)
BASOS ABS: 0 10*3/uL (ref 0.0–0.1)
EOS ABS: 0.1 10*3/uL (ref 0.0–0.5)
EOS%: 1.4 % (ref 0.0–7.0)
HCT: 42.8 % (ref 34.8–46.6)
HGB: 14.4 g/dL (ref 11.6–15.9)
LYMPH%: 23.6 % (ref 14.0–49.7)
MCH: 31.6 pg (ref 25.1–34.0)
MCHC: 33.6 g/dL (ref 31.5–36.0)
MCV: 94.1 fL (ref 79.5–101.0)
MONO#: 0.6 10*3/uL (ref 0.1–0.9)
MONO%: 11.2 % (ref 0.0–14.0)
NEUT#: 3.1 10*3/uL (ref 1.5–6.5)
NEUT%: 63.4 % (ref 38.4–76.8)
PLATELETS: 227 10*3/uL (ref 145–400)
RBC: 4.55 10*6/uL (ref 3.70–5.45)
RDW: 12.3 % (ref 11.2–14.5)
WBC: 4.9 10*3/uL (ref 3.9–10.3)
lymph#: 1.2 10*3/uL (ref 0.9–3.3)

## 2017-03-02 MED ORDER — SODIUM CHLORIDE 0.9 % IV SOLN
Freq: Once | INTRAVENOUS | Status: AC
  Administered 2017-03-02: 11:00:00 via INTRAVENOUS

## 2017-03-02 MED ORDER — SODIUM CHLORIDE 0.9 % IV SOLN
10.0000 mg/kg | Freq: Once | INTRAVENOUS | Status: AC
  Administered 2017-03-02: 860 mg via INTRAVENOUS
  Filled 2017-03-02: qty 7.2

## 2017-03-02 NOTE — Patient Instructions (Signed)
[facility] Discharge Instructions for Patients Receiving Chemotherapy  Today you received the following chemotherapy agents: Imfinzi.  To help prevent nausea and vomiting after your treatment, we encourage you to take your nausea medication as directed.   If you develop nausea and vomiting that is not controlled by your nausea medication, call the clinic.   BELOW ARE SYMPTOMS THAT SHOULD BE REPORTED IMMEDIATELY:  *FEVER GREATER THAN 100.5 F  *CHILLS WITH OR WITHOUT FEVER  NAUSEA AND VOMITING THAT IS NOT CONTROLLED WITH YOUR NAUSEA MEDICATION  *UNUSUAL SHORTNESS OF BREATH  *UNUSUAL BRUISING OR BLEEDING  TENDERNESS IN MOUTH AND THROAT WITH OR WITHOUT PRESENCE OF ULCERS  *URINARY PROBLEMS  *BOWEL PROBLEMS  UNUSUAL RASH Items with * indicate a potential emergency and should be followed up as soon as possible.  Feel free to call the clinic should you have any questions or concerns. The clinic phone number is [phone].  Please show the CHEMO ALERT CARD at check-in to the Emergency Department and triage nurse.   

## 2017-03-02 NOTE — Progress Notes (Signed)
[facility] [phone]   [phone]  OFFICE PROGRESS NOTE  Hulan Fess, MD [place] [address]  DIAGNOSIS: Stage IIIA (T2a, N2, M0) non-small cell lung cancer, adenocarcinoma presented with left suprahilar mass, mediastinal lymphadenopathy and suspicious pulmonary nodule in the left upper lobe diagnosed in November 2017. No actionable mutations on Guardant 360 testing.  PRIOR THERAPY: Status post concurrent chemoradiation with weekly carboplatin for AUC of 2 and paclitaxel 45 MG/M2 for 7 cycles. Last dose was given 06/13/2016.  CURRENT THERAPY: Immunotherapy with Imfinzi (Durvalumab) 10 MG/KG every 2 weeks. First dose 08/11/2016. Status post 12 cycles.  INTERVAL HISTORY: Debbie Baker 51 y.o. female returns to the clinic today for follow-up visit. The patient is feeling fine today with no specific complaints. The pain in her shoulders had resolved. She denied having any current chest pain, shortness of breath, cough or hemoptysis. She denied having any fever or chills. She has no nausea, vomiting, diarrhea or constipation. She is here today for evaluation before starting cycle #13 of her immunotherapy.  MEDICAL HISTORY: Past Medical History:  Diagnosis Date  . Allergic rhinitis   . Deaf, left   . Diverticulosis   . Dyspnea   . Encounter for antineoplastic chemotherapy 04/18/2016  . Encounter for antineoplastic immunotherapy 07/21/2016  . GERD (gastroesophageal reflux disease)   . Hyperlipidemia   . Lung cancer ([place])    non small cell lung cancer favoring adenocarcinoma   . Lung nodules   . Migraine   . Odynophagia 05/24/2016  . Pneumonia   . PONV (postoperative nausea and vomiting)   . Tachycardia, paroxysmal ([place]) 09/08/2016  . Wears glasses     ALLERGIES:  is allergic to penicillins; chantix [varenicline]; and levaquin [levofloxacin].  MEDICATIONS:  Current Outpatient Prescriptions  Medication Sig Dispense  Refill  . albuterol (PROVENTIL HFA;VENTOLIN HFA) 108 (90 Base) MCG/ACT inhaler Inhale 2 puffs into the lungs every 6 (six) hours as needed for wheezing or shortness of breath. 1 Inhaler 0  . benzonatate (TESSALON) 100 MG capsule Take 1 capsule (100 mg total) by mouth every 4 (four) hours as needed for cough. 60 capsule 1  . Calcium-Magnesium-Vitamin D 185-50-100 MG-MG-UNIT CAPS Take 1 tablet by mouth every evening.    . cetirizine (ZYRTEC) 10 MG tablet Take 10 mg by mouth daily.    . diphenhydrAMINE (BENADRYL) 25 mg capsule Take 25 mg by mouth at bedtime as needed.    . doxycycline (VIBRA-TABS) 100 MG tablet Take 1 tablet (100 mg total) by mouth 2 (two) times daily. 20 tablet 0  . durvalumab (IMFINZI) 120 MG/2.4ML SOLN injection Inject 120 mg into the vein every 14 (fourteen) days.     Nyoka Cowden Tea, Camillia sinensis, (GREEN TEA EXTRACT PO) Take by mouth.    . Lactobacillus (PROBIOTIC ACIDOPHILUS) CAPS Take by mouth.    . MELATONIN GUMMIES PO Take by mouth.    . Milk Thistle 1000 MG CAPS Take 1 capsule by mouth daily.    . Misc Natural Products (BLACK COHOSH MENOPAUSE COMPLEX) TABS Take 1 capsule by mouth 2 (two) times daily.    . Misc Natural Products (TURMERIC CURCUMIN) CAPS Take 1 capsule by mouth every evening. 1500 mg capsule    . Multiple Vitamins-Minerals (WOMENS 50+ MULTI VITAMIN/MIN) TABS Take 1 tablet by mouth daily.    . naproxen sodium (ANAPROX) 220 MG tablet Take 220-440 mg by mouth 2 (two) times daily as needed (for pain).     Marland Kitchen  OVER THE COUNTER MEDICATION Take 250 mg by mouth daily. Jiaogulan Supplement    . pantoprazole (PROTONIX) 40 MG tablet Take 1 tablet (40 mg total) by mouth every evening. 90 tablet 0  . pravastatin (PRAVACHOL) 20 MG tablet Take 20 mg by mouth every evening.  3  . prochlorperazine (COMPAZINE) 10 MG tablet Take 1 tablet (10 mg total) by mouth every 6 (six) hours as needed for nausea or vomiting. (Patient not taking: Reported on 02/02/2017) 30 tablet 0  .  ranitidine (ZANTAC) 150 MG tablet Take 150-300 mg by mouth every evening. 300 mg with more acidic meals     No current facility-administered medications for this visit.     SURGICAL HISTORY:  Past Surgical History:  Procedure Laterality Date  . ABDOMINAL HYSTERECTOMY    . VIDEO BRONCHOSCOPY Bilateral 07/03/2015   Procedure: VIDEO BRONCHOSCOPY WITH FLUORO;  Surgeon: Marshell Garfinkel, MD;  Location: [facility];  Service: Cardiopulmonary;  Laterality: Bilateral;  . VIDEO BRONCHOSCOPY WITH ENDOBRONCHIAL ULTRASOUND N/A 04/06/2016   Procedure: VIDEO BRONCHOSCOPY WITH ENDOBRONCHIAL ULTRASOUND;  Surgeon: Collene Gobble, MD;  Location: [facility];  Service: Thoracic;  Laterality: N/A;  . WRIST FRACTURE SURGERY      REVIEW OF SYSTEMS:  A comprehensive review of systems was negative except for: Constitutional: positive for fatigue   PHYSICAL EXAMINATION: General appearance: alert, cooperative, fatigued and no distress Head: Normocephalic, without obvious abnormality, atraumatic Neck: no adenopathy, no JVD, supple, symmetrical, trachea midline and thyroid not enlarged, symmetric, no tenderness/mass/nodules Lymph nodes: Cervical, supraclavicular, and axillary nodes normal. Resp: wheezes bilaterally Back: symmetric, no curvature. ROM normal. No CVA tenderness. Cardio: regular rate and rhythm, S1, S2 normal, no murmur, click, rub or gallop GI: soft, non-tender; bowel sounds normal; no masses,  no organomegaly Extremities: extremities normal, atraumatic, no cyanosis or edema  ECOG PERFORMANCE STATUS: 1 - Symptomatic but completely ambulatory  Blood pressure 91/71, pulse 98, temperature 98.5 F (36.9 C), temperature source Oral, resp. rate 18, height 5' 7.5" (1.715 m), weight 198 lb 14.4 oz (90.2 kg), SpO2 98 %.  LABORATORY DATA: Lab Results  Component Value Date   WBC 4.9 03/02/2017   HGB 14.4 03/02/2017   HCT 42.8 03/02/2017   MCV 94.1 03/02/2017   PLT 227 03/02/2017      Chemistry        Component Value Date/Time   NA 138 03/02/2017 0942   K 4.1 03/02/2017 0942   CL 107 04/06/2016 0742   CO2 24 03/02/2017 0942   BUN 9.3 03/02/2017 0942   CREATININE 0.7 03/02/2017 0942      Component Value Date/Time   CALCIUM 9.9 03/02/2017 0942   ALKPHOS 96 03/02/2017 0942   AST 23 03/02/2017 0942   ALT 26 03/02/2017 0942   BILITOT 0.40 03/02/2017 0942       RADIOGRAPHIC STUDIES: Ct Chest W Contrast  Result Date: 02/01/2017 CLINICAL DATA:  Stage III lung cancer. EXAM: CT CHEST WITH CONTRAST TECHNIQUE: Multidetector CT imaging of the chest was performed during intravenous contrast administration. CONTRAST:  <See Chart> ISOVUE-300 IOPAMIDOL (ISOVUE-300) INJECTION 61% COMPARISON:  11/15/2016 FINDINGS: Cardiovascular: The heart size is normal. No pericardial effusion identified. Mediastinum/Nodes: The trachea appears patent and is midline. Normal appearance of the esophagus. Focal area of increased soft tissue within the lower anterior mediastinum near the pericardium measures 1.5 x 1.0 cm, image 80 of series 2. Previously 1.1 x 0.6 cm. Confluent left paratracheal soft tissue measures 1.1 cm, image 54 of series 2. Unchanged from previous exam.  The increased soft tissue within the anterior left hilar region measures 1.7 cm, image 57 of series 2. Previously 1.5 cm. Pre-vascular node within the upper mediastinum measures 8 mm, image 38 of series 2. Unchanged from previous exam. Lungs/Pleura: Small left pleural effusion is similar to previous exam. Mild centrilobular emphysema. Confluent masslike architectural distortion within the paramediastinal left lung compatible with changes of external beam radiation. Index nodule within the left upper lobe measures 7 mm, image 35 of series 7. Previously 5 mm. Stable index 3 mm right lower lobe pulmonary nodule, image 84 of series 7. 6 mm subpleural nodule is identified along the posteromedial right lower lobe, image 92 of series 7. Unchanged. 5 mm subpleural  nodule in the posterior right lung base is noted, image 116 of series 7. Previously 4 mm. Nodular density within the lingula measures 7 mm, image 74 of series 7. Previously this measured the same. Upper Abdomen: No acute abnormality. Musculoskeletal: No chest wall abnormality. No acute or significant osseous findings. IMPRESSION: 1. There has been mild interval increase in small nodular soft tissue focus in the left anterior mediastinum near the pericardium. Cannot rule out metastatic lymph node. Attention to this area on follow-up imaging is advised. 2. Stable appearance of masslike fibrosis in the left perihilar region compatible with changes of external beam radiation. 3. Stable appearance of infiltrative soft tissue in the anterior left hilum and left lower paratracheal mediastinum. 4. Scattered bilateral pulmonary nodularity is stable. 5. Stable small left pleural effusion 6. Aortic Atherosclerosis (ICD10-I70.0) and Emphysema (ICD10-J43.9). Electronically Signed   By: Kerby Moors M.D.   On: 02/01/2017 08:43    ASSESSMENT AND PLAN:  This is a very pleasant 51 years old white female with unresectable a stage IIIA non-small cell lung cancer, adenocarcinoma status post a course of concurrent chemoradiation with partial response. The patient is currently undergoing consolidation immunotherapy with Imfinzi (Durvalumab) status post 12 cycles. The patient continues to tolerate her treatment fairly well with no significant adverse effects. I recommended for her to proceed with cycle #13 today as a scheduled. I will see her back for follow-up visit in 2 weeks for reevaluation and repeat blood work. She was advised to call immediately if she has any concerning symptoms in the interval. The patient voices understanding of current disease status and treatment options and is in agreement with the current care plan. All questions were answered. The patient knows to call the clinic with any problems, questions or  concerns. We can certainly see the patient much sooner if necessary.  Disclaimer: This note was dictated with voice recognition software. Similar sounding words can inadvertently be transcribed and may not be corrected upon review.

## 2017-03-16 ENCOUNTER — Other Ambulatory Visit (HOSPITAL_BASED_OUTPATIENT_CLINIC_OR_DEPARTMENT_OTHER): Payer: 59

## 2017-03-16 ENCOUNTER — Encounter: Payer: Self-pay | Admitting: Internal Medicine

## 2017-03-16 ENCOUNTER — Telehealth: Payer: Self-pay | Admitting: Internal Medicine

## 2017-03-16 ENCOUNTER — Ambulatory Visit (HOSPITAL_BASED_OUTPATIENT_CLINIC_OR_DEPARTMENT_OTHER): Payer: 59 | Admitting: Internal Medicine

## 2017-03-16 ENCOUNTER — Ambulatory Visit (HOSPITAL_BASED_OUTPATIENT_CLINIC_OR_DEPARTMENT_OTHER): Payer: 59

## 2017-03-16 VITALS — BP 114/70 | HR 97 | Temp 98.3°F | Resp 17 | Ht 67.5 in | Wt 201.2 lb

## 2017-03-16 DIAGNOSIS — Z5112 Encounter for antineoplastic immunotherapy: Secondary | ICD-10-CM

## 2017-03-16 DIAGNOSIS — C3412 Malignant neoplasm of upper lobe, left bronchus or lung: Secondary | ICD-10-CM | POA: Diagnosis not present

## 2017-03-16 DIAGNOSIS — Z79899 Other long term (current) drug therapy: Secondary | ICD-10-CM

## 2017-03-16 DIAGNOSIS — C3492 Malignant neoplasm of unspecified part of left bronchus or lung: Secondary | ICD-10-CM

## 2017-03-16 LAB — CBC WITH DIFFERENTIAL/PLATELET
BASO%: 1.2 % (ref 0.0–2.0)
Basophils Absolute: 0 10*3/uL (ref 0.0–0.1)
EOS%: 2.4 % (ref 0.0–7.0)
Eosinophils Absolute: 0.1 10*3/uL (ref 0.0–0.5)
HEMATOCRIT: 43 % (ref 34.8–46.6)
HGB: 14.5 g/dL (ref 11.6–15.9)
LYMPH#: 1 10*3/uL (ref 0.9–3.3)
LYMPH%: 29.6 % (ref 14.0–49.7)
MCH: 31.9 pg (ref 25.1–34.0)
MCHC: 33.7 g/dL (ref 31.5–36.0)
MCV: 94.5 fL (ref 79.5–101.0)
MONO#: 0.5 10*3/uL (ref 0.1–0.9)
MONO%: 15.4 % — ABNORMAL HIGH (ref 0.0–14.0)
NEUT%: 51.4 % (ref 38.4–76.8)
NEUTROS ABS: 1.7 10*3/uL (ref 1.5–6.5)
PLATELETS: 242 10*3/uL (ref 145–400)
RBC: 4.55 10*6/uL (ref 3.70–5.45)
RDW: 12.9 % (ref 11.2–14.5)
WBC: 3.3 10*3/uL — AB (ref 3.9–10.3)

## 2017-03-16 LAB — COMPREHENSIVE METABOLIC PANEL
ALT: 37 U/L (ref 0–55)
ANION GAP: 10 meq/L (ref 3–11)
AST: 34 U/L (ref 5–34)
Albumin: 4.1 g/dL (ref 3.5–5.0)
Alkaline Phosphatase: 104 U/L (ref 40–150)
BILIRUBIN TOTAL: 0.38 mg/dL (ref 0.20–1.20)
BUN: 5.2 mg/dL — AB (ref 7.0–26.0)
CALCIUM: 9.1 mg/dL (ref 8.4–10.4)
CO2: 24 meq/L (ref 22–29)
CREATININE: 0.7 mg/dL (ref 0.6–1.1)
Chloride: 107 mEq/L (ref 98–109)
EGFR: >60 mL/min/{1.73_m2} (ref >60)
Glucose: 91 mg/dl (ref 70–140)
Potassium: 4 mEq/L (ref 3.5–5.1)
Sodium: 141 mEq/L (ref 136–145)
TOTAL PROTEIN: 7.2 g/dL (ref 6.4–8.3)

## 2017-03-16 LAB — TSH: TSH: 1.9 m[IU]/L (ref 0.308–3.960)

## 2017-03-16 MED ORDER — SODIUM CHLORIDE 0.9 % IV SOLN
Freq: Once | INTRAVENOUS | Status: AC
  Administered 2017-03-16: 10:00:00 via INTRAVENOUS

## 2017-03-16 MED ORDER — SODIUM CHLORIDE 0.9 % IV SOLN
10.0000 mg/kg | Freq: Once | INTRAVENOUS | Status: AC
  Administered 2017-03-16: 860 mg via INTRAVENOUS
  Filled 2017-03-16: qty 10

## 2017-03-16 NOTE — Patient Instructions (Signed)
[facility] Discharge Instructions for Patients Receiving Chemotherapy  Today you received the following chemotherapy agents: Imfinzi.  To help prevent nausea and vomiting after your treatment, we encourage you to take your nausea medication as directed.   If you develop nausea and vomiting that is not controlled by your nausea medication, call the clinic.   BELOW ARE SYMPTOMS THAT SHOULD BE REPORTED IMMEDIATELY:  *FEVER GREATER THAN 100.5 F  *CHILLS WITH OR WITHOUT FEVER  NAUSEA AND VOMITING THAT IS NOT CONTROLLED WITH YOUR NAUSEA MEDICATION  *UNUSUAL SHORTNESS OF BREATH  *UNUSUAL BRUISING OR BLEEDING  TENDERNESS IN MOUTH AND THROAT WITH OR WITHOUT PRESENCE OF ULCERS  *URINARY PROBLEMS  *BOWEL PROBLEMS  UNUSUAL RASH Items with * indicate a potential emergency and should be followed up as soon as possible.  Feel free to call the clinic should you have any questions or concerns. The clinic phone number is [phone].  Please show the CHEMO ALERT CARD at check-in to the Emergency Department and triage nurse.   

## 2017-03-16 NOTE — Telephone Encounter (Signed)
No additional appts to add per 10/18 los - patient already has 3 cycles scheduled.

## 2017-03-16 NOTE — Progress Notes (Signed)
[facility] [phone]   [phone]) [phone]  OFFICE PROGRESS NOTE  Hulan Fess, MD [place] [address]  DIAGNOSIS: Stage IIIA (T2a, N2, M0) non-small cell lung cancer, adenocarcinoma presented with left suprahilar mass, mediastinal lymphadenopathy and suspicious pulmonary nodule in the left upper lobe diagnosed in November 2017. No actionable mutations on Guardant 360 testing.  PRIOR THERAPY: Status post concurrent chemoradiation with weekly carboplatin for AUC of 2 and paclitaxel 45 MG/M2 for 7 cycles. Last dose was given 06/13/2016.  CURRENT THERAPY: Immunotherapy with Imfinzi (Durvalumab) 10 MG/KG every 2 weeks. First dose 08/11/2016. Status post 13 cycles.  INTERVAL HISTORY: Debbie Baker 51 y.o. female returns to the clinic today for follow-up visit. The patient is feeling fine today with no specific complaints. She denied having any chest pain, shortness of breath, cough or hemoptysis. She denied having any weight loss or night sweats. She has no nausea, vomiting, diarrhea or constipation. She continues to tolerate her treatment with Imfinzi (Durvalumab) fairly well. She is here today for evaluation before starting cycle #14.   MEDICAL HISTORY: Past Medical History:  Diagnosis Date  . Allergic rhinitis   . Deaf, left   . Diverticulosis   . Dyspnea   . Encounter for antineoplastic chemotherapy 04/18/2016  . Encounter for antineoplastic immunotherapy 07/21/2016  . GERD (gastroesophageal reflux disease)   . Hyperlipidemia   . Lung cancer ([place])    non small cell lung cancer favoring adenocarcinoma   . Lung nodules   . Migraine   . Odynophagia 05/24/2016  . Pneumonia   . PONV (postoperative nausea and vomiting)   . Tachycardia, paroxysmal ([place]) 09/08/2016  . Wears glasses     ALLERGIES:  is allergic to penicillins; chantix [varenicline]; and levaquin [levofloxacin].  MEDICATIONS:  Current Outpatient Prescriptions    Medication Sig Dispense Refill  . albuterol (PROVENTIL HFA;VENTOLIN HFA) 108 (90 Base) MCG/ACT inhaler Inhale 2 puffs into the lungs every 6 (six) hours as needed for wheezing or shortness of breath. 1 Inhaler 0  . benzonatate (TESSALON) 100 MG capsule Take 1 capsule (100 mg total) by mouth every 4 (four) hours as needed for cough. 60 capsule 1  . Calcium-Magnesium-Vitamin D 185-50-100 MG-MG-UNIT CAPS Take 1 tablet by mouth every evening.    . cetirizine (ZYRTEC) 10 MG tablet Take 10 mg by mouth daily.    . diphenhydrAMINE (BENADRYL) 25 mg capsule Take 25 mg by mouth at bedtime as needed.    . doxycycline (VIBRA-TABS) 100 MG tablet Take 1 tablet (100 mg total) by mouth 2 (two) times daily. 20 tablet 0  . durvalumab (IMFINZI) 120 MG/2.4ML SOLN injection Inject 120 mg into the vein every 14 (fourteen) days.     Nyoka Cowden Tea, Camillia sinensis, (GREEN TEA EXTRACT PO) Take by mouth.    . Lactobacillus (PROBIOTIC ACIDOPHILUS) CAPS Take by mouth.    . MELATONIN GUMMIES PO Take by mouth.    . Milk Thistle 1000 MG CAPS Take 1 capsule by mouth daily.    . Misc Natural Products (BLACK COHOSH MENOPAUSE COMPLEX) TABS Take 1 capsule by mouth 2 (two) times daily.    . Misc Natural Products (TURMERIC CURCUMIN) CAPS Take 1 capsule by mouth every evening. 1500 mg capsule    . Multiple Vitamins-Minerals (WOMENS 50+ MULTI VITAMIN/MIN) TABS Take 1 tablet by mouth daily.    . naproxen sodium (ANAPROX) 220 MG tablet Take 220-440 mg by mouth 2 (two) times daily as needed (  for pain).     Marland Kitchen OVER THE COUNTER MEDICATION Take 250 mg by mouth daily. Jiaogulan Supplement    . pantoprazole (PROTONIX) 40 MG tablet Take 1 tablet (40 mg total) by mouth every evening. 90 tablet 0  . pravastatin (PRAVACHOL) 20 MG tablet Take 20 mg by mouth every evening.  3  . prochlorperazine (COMPAZINE) 10 MG tablet Take 1 tablet (10 mg total) by mouth every 6 (six) hours as needed for nausea or vomiting. (Patient not taking: Reported on  02/02/2017) 30 tablet 0  . ranitidine (ZANTAC) 150 MG tablet Take 150-300 mg by mouth every evening. 300 mg with more acidic meals     No current facility-administered medications for this visit.     SURGICAL HISTORY:  Past Surgical History:  Procedure Laterality Date  . ABDOMINAL HYSTERECTOMY    . VIDEO BRONCHOSCOPY Bilateral 07/03/2015   Procedure: VIDEO BRONCHOSCOPY WITH FLUORO;  Surgeon: Marshell Garfinkel, MD;  Location: [facility];  Service: Cardiopulmonary;  Laterality: Bilateral;  . VIDEO BRONCHOSCOPY WITH ENDOBRONCHIAL ULTRASOUND N/A 04/06/2016   Procedure: VIDEO BRONCHOSCOPY WITH ENDOBRONCHIAL ULTRASOUND;  Surgeon: Collene Gobble, MD;  Location: [facility];  Service: Thoracic;  Laterality: N/A;  . WRIST FRACTURE SURGERY      REVIEW OF SYSTEMS:  A comprehensive review of systems was negative.   PHYSICAL EXAMINATION: General appearance: alert, cooperative and no distress Head: Normocephalic, without obvious abnormality, atraumatic Neck: no adenopathy, no JVD, supple, symmetrical, trachea midline and thyroid not enlarged, symmetric, no tenderness/mass/nodules Lymph nodes: Cervical, supraclavicular, and axillary nodes normal. Resp: wheezes bilaterally Back: symmetric, no curvature. ROM normal. No CVA tenderness. Cardio: regular rate and rhythm, S1, S2 normal, no murmur, click, rub or gallop GI: soft, non-tender; bowel sounds normal; no masses,  no organomegaly Extremities: extremities normal, atraumatic, no cyanosis or edema  ECOG PERFORMANCE STATUS: 1 - Symptomatic but completely ambulatory  Blood pressure 114/70, pulse 97, temperature 98.3 F (36.8 C), temperature source Oral, resp. rate 17, height 5' 7.5" (1.715 m), weight 201 lb 3.2 oz (91.3 kg), SpO2 97 %.  LABORATORY DATA: Lab Results  Component Value Date   WBC 3.3 (L) 03/16/2017   HGB 14.5 03/16/2017   HCT 43.0 03/16/2017   MCV 94.5 03/16/2017   PLT 242 03/16/2017      Chemistry      Component Value Date/Time   NA 138  03/02/2017 0942   K 4.1 03/02/2017 0942   CL 107 04/06/2016 0742   CO2 24 03/02/2017 0942   BUN 9.3 03/02/2017 0942   CREATININE 0.7 03/02/2017 0942      Component Value Date/Time   CALCIUM 9.9 03/02/2017 0942   ALKPHOS 96 03/02/2017 0942   AST 23 03/02/2017 0942   ALT 26 03/02/2017 0942   BILITOT 0.40 03/02/2017 0942       RADIOGRAPHIC STUDIES: No results found.  ASSESSMENT AND PLAN:  This is a very pleasant 51 years old white female with unresectable a stage IIIA non-small cell lung cancer, adenocarcinoma status post a course of concurrent chemoradiation with partial response. The patient is currently undergoing consolidation immunotherapy with Imfinzi (Durvalumab) status post 13 cycles. She tolerated the last cycle of her treatment fairly well. I recommended for the patient to proceed with cycle #14 today as scheduled. I would see her back for follow-up visit in 2 weeks for reevaluation before starting cycle #15. The patient was advised to call immediately if she has any concerning symptoms in the interval. The patient voices understanding of current disease  status and treatment options and is in agreement with the current care plan. All questions were answered. The patient knows to call the clinic with any problems, questions or concerns. We can certainly see the patient much sooner if necessary.  Disclaimer: This note was dictated with voice recognition software. Similar sounding words can inadvertently be transcribed and may not be corrected upon review.

## 2017-03-22 ENCOUNTER — Ambulatory Visit: Payer: Managed Care, Other (non HMO) | Admitting: Gastroenterology

## 2017-03-30 ENCOUNTER — Telehealth: Payer: Self-pay | Admitting: Internal Medicine

## 2017-03-30 ENCOUNTER — Ambulatory Visit (HOSPITAL_BASED_OUTPATIENT_CLINIC_OR_DEPARTMENT_OTHER): Payer: 59

## 2017-03-30 ENCOUNTER — Other Ambulatory Visit (HOSPITAL_BASED_OUTPATIENT_CLINIC_OR_DEPARTMENT_OTHER): Payer: 59

## 2017-03-30 ENCOUNTER — Ambulatory Visit (HOSPITAL_BASED_OUTPATIENT_CLINIC_OR_DEPARTMENT_OTHER): Payer: 59 | Admitting: Internal Medicine

## 2017-03-30 ENCOUNTER — Encounter: Payer: Self-pay | Admitting: *Deleted

## 2017-03-30 ENCOUNTER — Other Ambulatory Visit: Payer: Self-pay | Admitting: *Deleted

## 2017-03-30 ENCOUNTER — Encounter: Payer: Self-pay | Admitting: Internal Medicine

## 2017-03-30 ENCOUNTER — Other Ambulatory Visit: Payer: Self-pay | Admitting: Medical Oncology

## 2017-03-30 VITALS — BP 111/77 | HR 87 | Temp 99.6°F | Resp 20 | Ht 67.5 in | Wt 205.6 lb

## 2017-03-30 DIAGNOSIS — R590 Localized enlarged lymph nodes: Secondary | ICD-10-CM

## 2017-03-30 DIAGNOSIS — C3492 Malignant neoplasm of unspecified part of left bronchus or lung: Secondary | ICD-10-CM

## 2017-03-30 DIAGNOSIS — C3412 Malignant neoplasm of upper lobe, left bronchus or lung: Secondary | ICD-10-CM

## 2017-03-30 DIAGNOSIS — Z5111 Encounter for antineoplastic chemotherapy: Secondary | ICD-10-CM | POA: Diagnosis not present

## 2017-03-30 DIAGNOSIS — Z5112 Encounter for antineoplastic immunotherapy: Secondary | ICD-10-CM

## 2017-03-30 LAB — CBC WITH DIFFERENTIAL/PLATELET
BASO%: 0.8 % (ref 0.0–2.0)
Basophils Absolute: 0 10*3/uL (ref 0.0–0.1)
EOS%: 2.2 % (ref 0.0–7.0)
Eosinophils Absolute: 0.1 10*3/uL (ref 0.0–0.5)
HCT: 42.8 % (ref 34.8–46.6)
HGB: 14.3 g/dL (ref 11.6–15.9)
LYMPH#: 1.4 10*3/uL (ref 0.9–3.3)
LYMPH%: 24 % (ref 14.0–49.7)
MCH: 31.5 pg (ref 25.1–34.0)
MCHC: 33.4 g/dL (ref 31.5–36.0)
MCV: 94.3 fL (ref 79.5–101.0)
MONO#: 0.5 10*3/uL (ref 0.1–0.9)
MONO%: 8.3 % (ref 0.0–14.0)
NEUT#: 3.8 10*3/uL (ref 1.5–6.5)
NEUT%: 64.7 % (ref 38.4–76.8)
Platelets: 221 10*3/uL (ref 145–400)
RBC: 4.53 10*6/uL (ref 3.70–5.45)
RDW: 13.1 % (ref 11.2–14.5)
WBC: 5.9 10*3/uL (ref 3.9–10.3)

## 2017-03-30 LAB — COMPREHENSIVE METABOLIC PANEL
ALT: 31 U/L (ref 0–55)
AST: 26 U/L (ref 5–34)
Albumin: 4 g/dL (ref 3.5–5.0)
Alkaline Phosphatase: 115 U/L (ref 40–150)
Anion Gap: 9 mEq/L (ref 3–11)
BUN: 9.9 mg/dL (ref 7.0–26.0)
CHLORIDE: 106 meq/L (ref 98–109)
CO2: 25 meq/L (ref 22–29)
Calcium: 9.4 mg/dL (ref 8.4–10.4)
Creatinine: 0.7 mg/dL (ref 0.6–1.1)
EGFR: >60 mL/min/{1.73_m2} (ref >60)
Glucose: 91 mg/dl (ref 70–140)
Potassium: 4.4 mEq/L (ref 3.5–5.1)
SODIUM: 140 meq/L (ref 136–145)
Total Bilirubin: 0.29 mg/dL (ref 0.20–1.20)
Total Protein: 7 g/dL (ref 6.4–8.3)

## 2017-03-30 MED ORDER — SODIUM CHLORIDE 0.9 % IV SOLN
Freq: Once | INTRAVENOUS | Status: AC
  Administered 2017-03-30: 11:00:00 via INTRAVENOUS

## 2017-03-30 MED ORDER — OXYCODONE-ACETAMINOPHEN 5-325 MG PO TABS: 1.0000 | ORAL_TABLET | Freq: Three times a day (TID) | ORAL | 0 refills | Status: DC | PRN

## 2017-03-30 MED ORDER — SODIUM CHLORIDE 0.9 % IV SOLN
10.0000 mg/kg | Freq: Once | INTRAVENOUS | Status: AC
  Administered 2017-03-30: 860 mg via INTRAVENOUS
  Filled 2017-03-30: qty 7.2

## 2017-03-30 NOTE — Patient Instructions (Signed)
[facility] Discharge [facility] for Patients Receiving Chemotherapy  Today you received the following chemotherapy agents: Imfinzi.  To help prevent nausea and vomiting after your treatment, we encourage you to take your nausea medication as directed.   If you develop nausea and vomiting that is not controlled by your nausea medication, call the clinic.   BELOW ARE SYMPTOMS THAT SHOULD BE REPORTED IMMEDIATELY:  *FEVER GREATER THAN 100.5 F  *CHILLS WITH OR WITHOUT FEVER  NAUSEA AND VOMITING THAT IS NOT CONTROLLED WITH YOUR NAUSEA MEDICATION  *UNUSUAL SHORTNESS OF BREATH  *UNUSUAL BRUISING OR BLEEDING  TENDERNESS IN MOUTH AND THROAT WITH OR WITHOUT PRESENCE OF ULCERS  *URINARY PROBLEMS  *BOWEL PROBLEMS  UNUSUAL RASH Items with * indicate a potential emergency and should be followed up as soon as possible.  Feel free to call the clinic should you have any questions or concerns. The clinic phone number is [phone].  Please show the CHEMO ALERT CARD at check-in to the Emergency Department and triage nurse.   

## 2017-03-30 NOTE — Telephone Encounter (Signed)
Scheduled appt per 11/1 los - added additional cycle per treatment plan - scheduled the week of 12/13 to the following due to patient being out of town . Gave patient AVS and calender per los.

## 2017-03-30 NOTE — Progress Notes (Addendum)
Nurse reports pt continues to have persistent pain in left anterior chest and L ,scapula area and left lateral chest near ribs. Her pain is also in her right mid back.  When she sneezes the pain is worse especially the left lateral rib area. She take naproxen which does not relieve the pain. Dr Julien Nordmann Notified.

## 2017-03-30 NOTE — Progress Notes (Signed)
[facility] [phone]   [phone]  OFFICE PROGRESS NOTE  Hulan Fess, MD [place] [address]  DIAGNOSIS: Stage IIIA (T2a, N2, M0) non-small cell lung cancer, adenocarcinoma presented with left suprahilar mass, mediastinal lymphadenopathy and suspicious pulmonary nodule in the left upper lobe diagnosed in November 2017. No actionable mutations on [employer] testing.  PRIOR THERAPY: Status post concurrent chemoradiation with weekly carboplatin for AUC of 2 and paclitaxel 45 MG/M2 for 7 cycles. Last dose was given 06/13/2016.  CURRENT THERAPY: Immunotherapy with Imfinzi (Durvalumab) 10 MG/KG every 2 weeks. First dose 08/11/2016. Status post 14 cycles.  INTERVAL HISTORY: Debbie Baker 51 y.o. female returns to the clinic today for follow-up visit.  The patient is feeling fine today with no specific complaints except for recent sinus infection.  She denied having any chest pain, shortness of breath, cough or hemoptysis.  She denied having any fever or chills.  The patient continues to tolerate her treatment with Imfinzi (Durvalumab) fairly well.  She denied having any nausea, vomiting, diarrhea or constipation.  She denied having any skin rash.  She is here today for evaluation before starting cycle #15.   MEDICAL HISTORY: Past Medical History:  Diagnosis Date  . Allergic rhinitis   . Deaf, left   . Diverticulosis    sigmoid colon  . Dyspnea   . Encounter for antineoplastic chemotherapy 04/18/2016  . Encounter for antineoplastic immunotherapy 07/21/2016  . GERD (gastroesophageal reflux disease)   . Hyperlipidemia   . Lung cancer ([place])    non small cell lung cancer favoring adenocarcinoma   . Lung nodules   . Migraine   . Odynophagia 05/24/2016  . Pneumonia   . PONV (postoperative nausea and vomiting)   . Tachycardia, paroxysmal ([place]) 09/08/2016  . Wears glasses     ALLERGIES:  is allergic to penicillins; chantix  [varenicline]; and levaquin [levofloxacin].  MEDICATIONS:  Current Outpatient Prescriptions  Medication Sig Dispense Refill  . albuterol (PROVENTIL HFA;VENTOLIN HFA) 108 (90 Base) MCG/ACT inhaler Inhale 2 puffs into the lungs every 6 (six) hours as needed for wheezing or shortness of breath. 1 Inhaler 0  . benzonatate (TESSALON) 100 MG capsule Take 1 capsule (100 mg total) by mouth every 4 (four) hours as needed for cough. (Patient not taking: Reported on 03/16/2017) 60 capsule 1  . Calcium-Magnesium-Vitamin D 185-50-100 MG-MG-UNIT CAPS Take 1 tablet by mouth every evening.    . cetirizine (ZYRTEC) 10 MG tablet Take 10 mg by mouth daily.    . diphenhydrAMINE (BENADRYL) 25 mg capsule Take 25 mg by mouth at bedtime as needed.    . durvalumab (IMFINZI) 120 MG/2.4ML SOLN injection Inject 120 mg into the vein every 14 (fourteen) days.     Nyoka Cowden Tea, Camillia sinensis, (GREEN TEA EXTRACT PO) Take by mouth.    . Lactobacillus (PROBIOTIC ACIDOPHILUS) CAPS Take by mouth.    . MELATONIN GUMMIES PO Take by mouth.    . Milk Thistle 1000 MG CAPS Take 1 capsule by mouth daily.    . Misc Natural Products (BLACK COHOSH MENOPAUSE COMPLEX) TABS Take 1 capsule by mouth 2 (two) times daily.    . Misc Natural Products (TURMERIC CURCUMIN) CAPS Take 1 capsule by mouth every evening. 1500 mg capsule    . Multiple Vitamins-Minerals (WOMENS 50+ MULTI VITAMIN/MIN) TABS Take 1 tablet by mouth daily.    . naproxen sodium (ANAPROX) 220 MG tablet Take 220-440 mg by mouth 2 (  two) times daily as needed (for pain).     Marland Kitchen OVER THE COUNTER MEDICATION Take 250 mg by mouth daily. Jiaogulan Supplement    . pantoprazole (PROTONIX) 40 MG tablet Take 1 tablet (40 mg total) by mouth every evening. 90 tablet 0  . pravastatin (PRAVACHOL) 20 MG tablet Take 20 mg by mouth every evening.  3  . prochlorperazine (COMPAZINE) 10 MG tablet Take 1 tablet (10 mg total) by mouth every 6 (six) hours as needed for nausea or vomiting. (Patient not  taking: Reported on 02/02/2017) 30 tablet 0  . ranitidine (ZANTAC) 150 MG tablet Take 150-300 mg by mouth every evening. 300 mg with more acidic meals     No current facility-administered medications for this visit.     SURGICAL HISTORY:  Past Surgical History:  Procedure Laterality Date  . ABDOMINAL HYSTERECTOMY    . VIDEO BRONCHOSCOPY Bilateral 07/03/2015   Procedure: VIDEO BRONCHOSCOPY WITH FLUORO;  Surgeon: Marshell Garfinkel, MD;  Location: [facility];  Service: Cardiopulmonary;  Laterality: Bilateral;  . VIDEO BRONCHOSCOPY WITH ENDOBRONCHIAL ULTRASOUND N/A 04/06/2016   Procedure: VIDEO BRONCHOSCOPY WITH ENDOBRONCHIAL ULTRASOUND;  Surgeon: Collene Gobble, MD;  Location: [facility];  Service: Thoracic;  Laterality: N/A;  . WRIST FRACTURE SURGERY      REVIEW OF SYSTEMS:  A comprehensive review of systems was negative except for: Ears, nose, mouth, throat, and face: positive for nasal congestion   PHYSICAL EXAMINATION: General appearance: alert, cooperative and no distress Head: Normocephalic, without obvious abnormality, atraumatic Neck: no adenopathy, no JVD, supple, symmetrical, trachea midline and thyroid not enlarged, symmetric, no tenderness/mass/nodules Lymph nodes: Cervical, supraclavicular, and axillary nodes normal. Resp: wheezes bilaterally Back: symmetric, no curvature. ROM normal. No CVA tenderness. Cardio: regular rate and rhythm, S1, S2 normal, no murmur, click, rub or gallop GI: soft, non-tender; bowel sounds normal; no masses,  no organomegaly Extremities: extremities normal, atraumatic, no cyanosis or edema  ECOG PERFORMANCE STATUS: 1 - Symptomatic but completely ambulatory  Blood pressure 111/77, pulse 87, temperature 99.6 F (37.6 C), temperature source Oral, resp. rate 20, height 5' 7.5" (1.715 m), weight 205 lb 9.6 oz (93.3 kg), SpO2 98 %.  LABORATORY DATA: Lab Results  Component Value Date   WBC 5.9 03/30/2017   HGB 14.3 03/30/2017   HCT 42.8 03/30/2017   MCV 94.3  03/30/2017   PLT 221 03/30/2017      Chemistry      Component Value Date/Time   NA 140 03/30/2017 0856   K 4.4 03/30/2017 0856   CL 107 04/06/2016 0742   CO2 25 03/30/2017 0856   BUN 9.9 03/30/2017 0856   CREATININE 0.7 03/30/2017 0856      Component Value Date/Time   CALCIUM 9.4 03/30/2017 0856   ALKPHOS 115 03/30/2017 0856   AST 26 03/30/2017 0856   ALT 31 03/30/2017 0856   BILITOT 0.29 03/30/2017 0856       RADIOGRAPHIC STUDIES: No results found.  ASSESSMENT AND PLAN:  This is a very pleasant 51 years old white female with unresectable a stage IIIA non-small cell lung cancer, adenocarcinoma status post a course of concurrent chemoradiation with partial response. The patient is currently undergoing consolidation immunotherapy with Imfinzi (Durvalumab) status post 14 cycles. The patient continues to tolerate her treatment fairly well with no significant adverse effects. I recommended for her to proceed with cycle #15 today as a schedule.  I will see her back for follow-up visit in 2 weeks for evaluation before starting cycle #16. The patient  was advised to call immediately if she has any concerning symptoms in the interval. The patient voices understanding of current disease status and treatment options and is in agreement with the current care plan. All questions were answered. The patient knows to call the clinic with any problems, questions or concerns. We can certainly see the patient much sooner if necessary.  Disclaimer: This note was dictated with voice recognition software. Similar sounding words can inadvertently be transcribed and may not be corrected upon review.

## 2017-03-31 ENCOUNTER — Telehealth: Payer: Self-pay

## 2017-03-31 MED ORDER — AZITHROMYCIN 250 MG PO TABS: ORAL_TABLET | ORAL | 0 refills | Status: DC

## 2017-03-31 NOTE — Telephone Encounter (Signed)
Here yesterday, she had low grade fever in 99.  She takes sinus pills every day. Sneezing, no drainage. One side is clogged. No pain or pressure.  Diane said if she showed more signs of sinus infection let [facility] know.  She woke up with sweats. She gets this about 1x/year and gets a z-pack.   [facility] [address]  S/w Erasmo Downer and sent in zpack.

## 2017-04-04 ENCOUNTER — Encounter: Payer: Self-pay | Admitting: Gastroenterology

## 2017-04-04 ENCOUNTER — Ambulatory Visit: Payer: 59 | Admitting: Gastroenterology

## 2017-04-04 VITALS — BP 98/76 | HR 60 | Ht 67.5 in | Wt 203.0 lb

## 2017-04-04 DIAGNOSIS — R1013 Epigastric pain: Secondary | ICD-10-CM | POA: Diagnosis not present

## 2017-04-04 DIAGNOSIS — C3490 Malignant neoplasm of unspecified part of unspecified bronchus or lung: Secondary | ICD-10-CM | POA: Diagnosis not present

## 2017-04-04 DIAGNOSIS — R1310 Dysphagia, unspecified: Secondary | ICD-10-CM | POA: Diagnosis not present

## 2017-04-04 DIAGNOSIS — K219 Gastro-esophageal reflux disease without esophagitis: Secondary | ICD-10-CM | POA: Diagnosis not present

## 2017-04-04 MED ORDER — PANTOPRAZOLE SODIUM 40 MG PO TBEC: 40.0000 mg | DELAYED_RELEASE_TABLET | Freq: Two times a day (BID) | ORAL | 3 refills | Status: DC

## 2017-04-04 NOTE — Progress Notes (Signed)
HPI :  51 year old female here for follow-up for reflux. Since last seen her unfortunately she was diagnosed with stage III non-small cell lung cancer in Nov 2017. She follows Dr. Julien Nordmann is currently taking Imfinzi (Durvalumab) for her disease. She has already completed chemoradiation with carboplatin and paclitaxel. Her most recent CT scan of her chest in September showed a small nodular soft tissue focus in the left anterior mediastinum near the pericardium, otherwise stable changes of the chest.  Regarding her reflux, she is taking Protonix 40 mg once a day as well as Zantac 150 mg once to twice per day. Despite this she continues to have some nocturnal symptoms. She endorses if she misses any tablet she definitely has significant rebound heartburn. Otherwise has a fair amount of breakthrough routinely for her heartburn. She does endorse having some mild dysphagia to large pieces of food and to some pills following her radiation therapy which she feels is related. She also has some occasional epigastric discomfort. She thinks her epigastric discomfort is worsened with worsening reflux symptoms. Father had esophageal cancer dx age 83. She declined EGD at our last visit for BE screening.  Endoscopic history: Colonoscopy Jan 2015 - no polyps, sigmoid diverticulosis EGD 2010 normal   Past Medical History:  Diagnosis Date  . Allergic rhinitis   . Deaf, left   . Diverticulosis    sigmoid colon  . Dyspnea   . Encounter for antineoplastic chemotherapy 04/18/2016  . Encounter for antineoplastic immunotherapy 07/21/2016  . GERD (gastroesophageal reflux disease)   . Hyperlipidemia   . Lung cancer ([place])    non small cell lung cancer favoring adenocarcinoma   . Lung nodules   . Migraine   . Odynophagia 05/24/2016  . Pneumonia   . PONV (postoperative nausea and vomiting)   . Tachycardia, paroxysmal ([place]) 09/08/2016  . Wears glasses      Past Surgical History:  Procedure Laterality Date  .  ABDOMINAL HYSTERECTOMY    . WRIST FRACTURE SURGERY     Family History  Problem Relation Age of Onset  . Esophageal cancer Father   . Breast cancer Mother    Social History   Tobacco Use  . Smoking status: Former Smoker    Packs/day: 1.50    Years: 30.00    Pack years: 45.00    Types: Cigarettes    Last attempt to quit: 08/02/2010    Years since quitting: 6.6  . Smokeless tobacco: Never Used  Substance Use Topics  . Alcohol use: Yes    Alcohol/week: 0.0 oz    Comment: 1-2 drinks daily  . Drug use: Yes    Types: Marijuana    Comment: last use 02/2016   Current Outpatient Medications  Medication Sig Dispense Refill  . albuterol (PROVENTIL HFA;VENTOLIN HFA) 108 (90 Base) MCG/ACT inhaler Inhale 2 puffs into the lungs every 6 (six) hours as needed for wheezing or shortness of breath. 1 Inhaler 0  . benzonatate (TESSALON) 100 MG capsule Take 1 capsule (100 mg total) by mouth every 4 (four) hours as needed for cough. 60 capsule 1  . Calcium-Magnesium-Vitamin D 185-50-100 MG-MG-UNIT CAPS Take 1 tablet by mouth every evening.    . cetirizine (ZYRTEC) 10 MG tablet Take 10 mg by mouth daily.    . diphenhydrAMINE (BENADRYL) 25 mg capsule Take 25 mg by mouth at bedtime as needed.    . durvalumab (IMFINZI) 120 MG/2.4ML SOLN injection Inject 120 mg into the vein every 14 (fourteen) days.     Marland Kitchen  Green Tea, Camillia sinensis, (GREEN TEA EXTRACT PO) Take by mouth.    . Lactobacillus (PROBIOTIC ACIDOPHILUS) CAPS Take by mouth.    . Milk Thistle 1000 MG CAPS Take 1 capsule by mouth daily.    . Misc Natural Products (BLACK COHOSH MENOPAUSE COMPLEX) TABS Take 1 capsule by mouth 2 (two) times daily.    . Misc Natural Products (TURMERIC CURCUMIN) CAPS Take 1 capsule by mouth every evening. 1500 mg capsule    . Multiple Vitamins-Minerals (WOMENS 50+ MULTI VITAMIN/MIN) TABS Take 1 tablet by mouth daily.    . naproxen sodium (ANAPROX) 220 MG tablet Take 220-440 mg by mouth 2 (two) times daily as needed  (for pain).     Marland Kitchen OVER THE COUNTER MEDICATION Take 250 mg by mouth daily. Jiaogulan Supplement    . oxyCODONE-acetaminophen (PERCOCET/ROXICET) 5-325 MG tablet Take 1 tablet by mouth every 8 (eight) hours as needed for severe pain. 30 tablet 0  . pantoprazole (PROTONIX) 40 MG tablet Take 1 tablet (40 mg total) by mouth every evening. 90 tablet 0  . pravastatin (PRAVACHOL) 20 MG tablet Take 20 mg by mouth every evening.  3  . prochlorperazine (COMPAZINE) 10 MG tablet Take 1 tablet (10 mg total) by mouth every 6 (six) hours as needed for nausea or vomiting. 30 tablet 0  . ranitidine (ZANTAC) 150 MG tablet Take 150-300 mg by mouth every evening. 300 mg with more acidic meals    . azithromycin (ZITHROMAX Z-PAK) 250 MG tablet Take as directed on package 6 each 0  . MELATONIN GUMMIES PO Take by mouth.     No current facility-administered medications for this visit.    Allergies  Allergen Reactions  . Penicillins Anaphylaxis and Rash     Has patient had a PCN reaction causing immediate rash, facial/tongue/throat swelling, SOB or lightheadedness with hypotension: # # YES # # Has patient had a PCN reaction causing severe rash involving mucus membranes or skin necrosis: No Has patient had a PCN reaction that required hospitalization No Has patient had a PCN reaction occurring within the last 10 years: # # YES # #  If all of the above answers are "NO", then may proceed with Cephalosporin use.   . Chantix [Varenicline] Other (See Comments)    Nightmares/lack of sleep  . Levaquin [Levofloxacin] Other (See Comments)    Muscle weakness/difficulty walking     Review of Systems: All systems reviewed and negative except where noted in HPI.   Lab Results  Component Value Date   WBC 5.9 03/30/2017   HGB 14.3 03/30/2017   HCT 42.8 03/30/2017   MCV 94.3 03/30/2017   PLT 221 03/30/2017    Lab Results  Component Value Date   CREATININE 0.7 03/30/2017   BUN 9.9 03/30/2017   NA 140 03/30/2017   K  4.4 03/30/2017   CL 107 04/06/2016   CO2 25 03/30/2017    Lab Results  Component Value Date   ALT 31 03/30/2017   AST 26 03/30/2017   ALKPHOS 115 03/30/2017   BILITOT 0.29 03/30/2017     Physical Exam: BP 98/76   Pulse 60   Ht 5' 7.5" (1.715 m)   Wt 203 lb (92.1 kg)   BMI 31.33 kg/m  Constitutional: Pleasant,well-developed, female in no acute distress. HEENT: Normocephalic and atraumatic. Conjunctivae are normal. No scleral icterus. Neck supple.  Cardiovascular: Normal rate, regular rhythm.  Pulmonary/chest: Effort normal and breath sounds normal.  Abdominal: Soft, mild epigastric to LUQ pain, nontender. there  are no masses palpable. No hepatomegaly. Extremities: no edema Neurological: Alert and oriented to person place and time. Skin: Skin is warm and dry. No rashes noted. Psychiatric: Normal mood and affect. Behavior is normal.   ASSESSMENT AND PLAN: 51 year old female with stage III lung cancer on immunotherapy status post chemotherapy and XRT, here for follow-up for GERD. Also notes periodic mild dysphagia and epigastric pain.   GERD - having fairly routine breakthrough heartburn on once daily dosing of Protonix, with Zantac as needed. Recommend she increase her Protonix to 40 mg twice daily for the next few weeks and see if this provides benefit, if it does she should continue this dosing. If it does not provide relief and she still has persistent symptoms, I asked her to contact us, may consider trial of Dexilant  Dysphagia - high suspicion for radiation-related stricturing, although secondary impingement from lung cancer or primary esophageal lesion in light of her family history of esophageal cancer and her long-standing reflux and tobacco use if possible; however, prior CT scan showed no evidence of esophageal mass or impingement. We discussed options. I offered her an EGD to further evaluate and dilate as needed or an esophogram. In light of her other medical problems  she wishes to hold off for now, she has planned follow up imaging of her chest in the next few weeks. If dysphagia worsens she will contact me  Epigastric pain - not sure if this is related to reflux / esophagitis, or her underlying malignancy. As above, she declined EGD, she has scheduled CT chest / abdomen / pelvis imaging in light of her malignancy, will await that result and course on higher dose PPI.   [place] Cellar, MD [facility] Pager [phone]

## 2017-04-04 NOTE — Patient Instructions (Signed)
We have sent the following medications to your pharmacy for you to pick up at your convenience: Protonix 40 mg, take twice a day.  Please call us if your symptoms do not improve at [phone].  If you are age 53 or older, your body mass index should be between 23-30. Your Body mass index is 31.33 kg/m. If this is out of the aforementioned range listed, please consider follow up with your Primary Care Provider.  If you are age 59 or younger, your body mass index should be between 19-25. Your Body mass index is 31.33 kg/m. If this is out of the aformentioned range listed, please consider follow up with your Primary Care Provider.   Thank you.

## 2017-04-07 ENCOUNTER — Encounter: Payer: Self-pay | Admitting: *Deleted

## 2017-04-10 ENCOUNTER — Telehealth: Payer: Self-pay | Admitting: Internal Medicine

## 2017-04-10 NOTE — Telephone Encounter (Signed)
FAXED OFFICE NOTES TO [facility] [phone]

## 2017-04-13 ENCOUNTER — Other Ambulatory Visit (HOSPITAL_BASED_OUTPATIENT_CLINIC_OR_DEPARTMENT_OTHER): Payer: 59

## 2017-04-13 ENCOUNTER — Encounter: Payer: Self-pay | Admitting: Internal Medicine

## 2017-04-13 ENCOUNTER — Ambulatory Visit (HOSPITAL_BASED_OUTPATIENT_CLINIC_OR_DEPARTMENT_OTHER): Payer: 59

## 2017-04-13 ENCOUNTER — Telehealth: Payer: Self-pay | Admitting: Internal Medicine

## 2017-04-13 ENCOUNTER — Ambulatory Visit (HOSPITAL_BASED_OUTPATIENT_CLINIC_OR_DEPARTMENT_OTHER): Payer: 59 | Admitting: Internal Medicine

## 2017-04-13 DIAGNOSIS — C3412 Malignant neoplasm of upper lobe, left bronchus or lung: Secondary | ICD-10-CM | POA: Diagnosis not present

## 2017-04-13 DIAGNOSIS — Z5112 Encounter for antineoplastic immunotherapy: Secondary | ICD-10-CM

## 2017-04-13 DIAGNOSIS — C3492 Malignant neoplasm of unspecified part of left bronchus or lung: Secondary | ICD-10-CM

## 2017-04-13 DIAGNOSIS — C3490 Malignant neoplasm of unspecified part of unspecified bronchus or lung: Secondary | ICD-10-CM

## 2017-04-13 DIAGNOSIS — Z79899 Other long term (current) drug therapy: Secondary | ICD-10-CM | POA: Diagnosis not present

## 2017-04-13 LAB — CBC WITH DIFFERENTIAL/PLATELET
BASO%: 0.8 % (ref 0.0–2.0)
BASOS ABS: 0 10*3/uL (ref 0.0–0.1)
EOS ABS: 0.1 10*3/uL (ref 0.0–0.5)
EOS%: 2.7 % (ref 0.0–7.0)
HEMATOCRIT: 40.9 % (ref 34.8–46.6)
HGB: 13.5 g/dL (ref 11.6–15.9)
LYMPH#: 1.1 10*3/uL (ref 0.9–3.3)
LYMPH%: 22.9 % (ref 14.0–49.7)
MCH: 31.4 pg (ref 25.1–34.0)
MCHC: 33 g/dL (ref 31.5–36.0)
MCV: 95.1 fL (ref 79.5–101.0)
MONO#: 0.5 10*3/uL (ref 0.1–0.9)
MONO%: 9.8 % (ref 0.0–14.0)
NEUT#: 3.1 10*3/uL (ref 1.5–6.5)
NEUT%: 63.8 % (ref 38.4–76.8)
PLATELETS: 213 10*3/uL (ref 145–400)
RBC: 4.3 10*6/uL (ref 3.70–5.45)
RDW: 12.4 % (ref 11.2–14.5)
WBC: 4.9 10*3/uL (ref 3.9–10.3)

## 2017-04-13 LAB — COMPREHENSIVE METABOLIC PANEL
ALT: 22 U/L (ref 0–55)
ANION GAP: 8 meq/L (ref 3–11)
AST: 20 U/L (ref 5–34)
Albumin: 3.9 g/dL (ref 3.5–5.0)
Alkaline Phosphatase: 99 U/L (ref 40–150)
BUN: 11.9 mg/dL (ref 7.0–26.0)
CALCIUM: 9 mg/dL (ref 8.4–10.4)
CHLORIDE: 107 meq/L (ref 98–109)
CO2: 22 mEq/L (ref 22–29)
CREATININE: 0.8 mg/dL (ref 0.6–1.1)
Glucose: 104 mg/dl (ref 70–140)
Potassium: 3.9 mEq/L (ref 3.5–5.1)
Sodium: 136 mEq/L (ref 136–145)
Total Bilirubin: 0.48 mg/dL (ref 0.20–1.20)
Total Protein: 6.9 g/dL (ref 6.4–8.3)

## 2017-04-13 LAB — TSH: TSH: 3.244 m[IU]/L (ref 0.308–3.960)

## 2017-04-13 LAB — RESEARCH LABS

## 2017-04-13 MED ORDER — SODIUM CHLORIDE 0.9% FLUSH
10.0000 mL | INTRAVENOUS | Status: DC | PRN
  Filled 2017-04-13: qty 10

## 2017-04-13 MED ORDER — SODIUM CHLORIDE 0.9 % IV SOLN
10.0000 mg/kg | Freq: Once | INTRAVENOUS | Status: AC
  Administered 2017-04-13: 860 mg via INTRAVENOUS
  Filled 2017-04-13: qty 17.2

## 2017-04-13 MED ORDER — SODIUM CHLORIDE 0.9 % IV SOLN
Freq: Once | INTRAVENOUS | Status: AC
  Administered 2017-04-13: 11:00:00 via INTRAVENOUS

## 2017-04-13 NOTE — Patient Instructions (Signed)
[facility] Discharge Instructions for Patients Receiving Chemotherapy  Today you received the following chemotherapy agents Durvalumab  To help prevent nausea and vomiting after your treatment, we encourage you to take your nausea medication as directed    If you develop nausea and vomiting that is not controlled by your nausea medication, call the clinic.   BELOW ARE SYMPTOMS THAT SHOULD BE REPORTED IMMEDIATELY:  *FEVER GREATER THAN 100.5 F  *CHILLS WITH OR WITHOUT FEVER  NAUSEA AND VOMITING THAT IS NOT CONTROLLED WITH YOUR NAUSEA MEDICATION  *UNUSUAL SHORTNESS OF BREATH  *UNUSUAL BRUISING OR BLEEDING  TENDERNESS IN MOUTH AND THROAT WITH OR WITHOUT PRESENCE OF ULCERS  *URINARY PROBLEMS  *BOWEL PROBLEMS  UNUSUAL RASH Items with * indicate a potential emergency and should be followed up as soon as possible.  Feel free to call the clinic should you have any questions or concerns. The clinic phone number is [phone].  Please show the [facility] at check-in to the Emergency Department and triage nurse.

## 2017-04-13 NOTE — Progress Notes (Signed)
[facility] Telephone:(336) [phone]   Fax:(336) [phone]  OFFICE PROGRESS NOTE  Hulan Fess, MD [place] [address]  DIAGNOSIS: Stage IIIA (T2a, N2, M0) non-small cell lung cancer, adenocarcinoma presented with left suprahilar mass, mediastinal lymphadenopathy and suspicious pulmonary nodule in the left upper lobe diagnosed in November 2017. No actionable mutations on Guardant 360 testing.  PRIOR THERAPY: Status post concurrent chemoradiation with weekly carboplatin for AUC of 2 and paclitaxel 45 MG/M2 for 7 cycles. Last dose was given 06/13/2016.  CURRENT THERAPY: Immunotherapy with Imfinzi (Durvalumab) 10 MG/KG every 2 weeks. First dose 08/11/2016. Status post 15 cycles.  INTERVAL HISTORY: Debbie Baker 51 y.o. female returns to the clinic today for follow-up visit.  The patient is feeling fine today with no specific complaints except for the persistent pain at the rib cage bilaterally.  She denied having any shortness of breath, cough or hemoptysis.  She denied having any weight loss or night sweats.  She has no nausea, vomiting, diarrhea or constipation.  She continues to tolerate her current treatment with Imfinzi (Durvalumab) fairly well.  She is also complaining of memory loss and making mistakes during work.  She is here today for evaluation before starting cycle #16.   MEDICAL HISTORY: Past Medical History:  Diagnosis Date  . Allergic rhinitis   . Deaf, left   . Diverticulosis    sigmoid colon  . Dyspnea   . Encounter for antineoplastic chemotherapy 04/18/2016  . Encounter for antineoplastic immunotherapy 07/21/2016  . GERD (gastroesophageal reflux disease)   . Hyperlipidemia   . Lung cancer ([facility])    non small cell lung cancer favoring adenocarcinoma   . Lung nodules   . Migraine   . Odynophagia 05/24/2016  . Pneumonia   . PONV (postoperative nausea and vomiting)   . Tachycardia, paroxysmal ([facility]) 09/08/2016  . Wears glasses      ALLERGIES:  is allergic to penicillins; chantix [varenicline]; and levaquin [levofloxacin].  MEDICATIONS:  Current Outpatient Medications  Medication Sig Dispense Refill  . albuterol (PROVENTIL HFA;VENTOLIN HFA) 108 (90 Base) MCG/ACT inhaler Inhale 2 puffs into the lungs every 6 (six) hours as needed for wheezing or shortness of breath. 1 Inhaler 0  . benzonatate (TESSALON) 100 MG capsule Take 1 capsule (100 mg total) by mouth every 4 (four) hours as needed for cough. 60 capsule 1  . Calcium-Magnesium-Vitamin D 185-50-100 MG-MG-UNIT CAPS Take 1 tablet by mouth every evening.    . cetirizine (ZYRTEC) 10 MG tablet Take 10 mg by mouth daily.    . diphenhydrAMINE (BENADRYL) 25 mg capsule Take 25 mg by mouth at bedtime as needed.    . durvalumab (IMFINZI) 120 MG/2.4ML SOLN injection Inject 120 mg into the vein every 14 (fourteen) days.     Nyoka Cowden Tea, Camillia sinensis, (GREEN TEA EXTRACT PO) Take by mouth.    . Lactobacillus (PROBIOTIC ACIDOPHILUS) CAPS Take by mouth.    . Milk Thistle 1000 MG CAPS Take 1 capsule by mouth daily.    . Misc Natural Products (BLACK COHOSH MENOPAUSE COMPLEX) TABS Take 1 capsule by mouth 2 (two) times daily.    . Misc Natural Products (TURMERIC CURCUMIN) CAPS Take 1 capsule by mouth every evening. 1500 mg capsule    . Multiple Vitamins-Minerals (WOMENS 50+ MULTI VITAMIN/MIN) TABS Take 1 tablet by mouth daily.    . naproxen sodium (ANAPROX) 220 MG tablet Take 220-440 mg by mouth 2 (two) times daily as needed (for  pain).     Marland Kitchen OVER THE COUNTER MEDICATION Take 250 mg by mouth daily. Jiaogulan Supplement    . oxyCODONE-acetaminophen (PERCOCET/ROXICET) 5-325 MG tablet Take 1 tablet by mouth every 8 (eight) hours as needed for severe pain. 30 tablet 0  . pantoprazole (PROTONIX) 40 MG tablet Take 1 tablet (40 mg total) 2 (two) times daily by mouth. 180 tablet 3  . pravastatin (PRAVACHOL) 20 MG tablet Take 20 mg by mouth every evening.  3  . prochlorperazine (COMPAZINE)  10 MG tablet Take 1 tablet (10 mg total) by mouth every 6 (six) hours as needed for nausea or vomiting. 30 tablet 0  . azithromycin (ZITHROMAX Z-PAK) 250 MG tablet Take as directed on package 6 each 0  . MELATONIN GUMMIES PO Take by mouth.    . ranitidine (ZANTAC) 150 MG tablet Take 150-300 mg by mouth every evening. 300 mg with more acidic meals     No current facility-administered medications for this visit.     SURGICAL HISTORY:  Past Surgical History:  Procedure Laterality Date  . ABDOMINAL HYSTERECTOMY    . VIDEO BRONCHOSCOPY Bilateral 07/03/2015   Procedure: VIDEO BRONCHOSCOPY WITH FLUORO;  Surgeon: Marshell Garfinkel, MD;  Location: [facility];  Service: Cardiopulmonary;  Laterality: Bilateral;  . VIDEO BRONCHOSCOPY WITH ENDOBRONCHIAL ULTRASOUND N/A 04/06/2016   Procedure: VIDEO BRONCHOSCOPY WITH ENDOBRONCHIAL ULTRASOUND;  Surgeon: Collene Gobble, MD;  Location: [facility];  Service: Thoracic;  Laterality: N/A;  . WRIST FRACTURE SURGERY      REVIEW OF SYSTEMS:  A comprehensive review of systems was negative except for: Constitutional: positive for fatigue Respiratory: positive for pleurisy/chest pain Neurological: positive for memory problems   PHYSICAL EXAMINATION: General appearance: alert, cooperative and no distress Head: Normocephalic, without obvious abnormality, atraumatic Neck: no adenopathy, no JVD, supple, symmetrical, trachea midline and thyroid not enlarged, symmetric, no tenderness/mass/nodules Lymph nodes: Cervical, supraclavicular, and axillary nodes normal. Resp: wheezes bilaterally Back: symmetric, no curvature. ROM normal. No CVA tenderness. Cardio: regular rate and rhythm, S1, S2 normal, no murmur, click, rub or gallop GI: soft, non-tender; bowel sounds normal; no masses,  no organomegaly Extremities: extremities normal, atraumatic, no cyanosis or edema  ECOG PERFORMANCE STATUS: 1 - Symptomatic but completely ambulatory  Blood pressure 105/71, pulse 84, temperature  98.6 F (37 C), temperature source Oral, resp. rate 18, height 5' 7.5" (1.715 m), weight 206 lb 3.2 oz (93.5 kg), SpO2 99 %.  LABORATORY DATA: Lab Results  Component Value Date   WBC 4.9 04/13/2017   HGB 13.5 04/13/2017   HCT 40.9 04/13/2017   MCV 95.1 04/13/2017   PLT 213 04/13/2017      Chemistry      Component Value Date/Time   NA 136 04/13/2017 0906   K 3.9 04/13/2017 0906   CL 107 04/06/2016 0742   CO2 22 04/13/2017 0906   BUN 11.9 04/13/2017 0906   CREATININE 0.8 04/13/2017 0906      Component Value Date/Time   CALCIUM 9.0 04/13/2017 0906   ALKPHOS 99 04/13/2017 0906   AST 20 04/13/2017 0906   ALT 22 04/13/2017 0906   BILITOT 0.48 04/13/2017 0906       RADIOGRAPHIC STUDIES: No results found.  ASSESSMENT AND PLAN:  This is a very pleasant 51 years old white female with unresectable a stage IIIA non-small cell lung cancer, adenocarcinoma status post a course of concurrent chemoradiation with partial response. The patient is currently undergoing consolidation immunotherapy with Imfinzi (Durvalumab) status post 15 cycles. The patient continues  to tolerate her treatment fairly well with no significant adverse effects. I recommended for her to proceed with cycle #16 today as a schedule. She will have repeat CT scan of the chest, abdomen and pelvis for restaging of her disease before the next cycle of her treatment. For the memory loss and mental status change, I will arrange for the patient to have repeat MRI of the brain before her next visit. The patient was advised to call immediately if she has any concerning symptoms in the interval. The patient voices understanding of current disease status and treatment options and is in agreement with the current care plan. All questions were answered. The patient knows to call the clinic with any problems, questions or concerns. We can certainly see the patient much sooner if necessary.  Disclaimer: This note was dictated with  voice recognition software. Similar sounding words can inadvertently be transcribed and may not be corrected upon review.

## 2017-04-13 NOTE — Telephone Encounter (Signed)
Gave patient avs and calendar with appts per 11/15 los.

## 2017-04-21 ENCOUNTER — Other Ambulatory Visit: Payer: Self-pay | Admitting: Gastroenterology

## 2017-04-24 ENCOUNTER — Telehealth: Payer: Self-pay | Admitting: Internal Medicine

## 2017-04-24 NOTE — Telephone Encounter (Signed)
Faxed records to [facility] risk adjustment

## 2017-04-25 ENCOUNTER — Telehealth: Payer: Self-pay

## 2017-04-25 ENCOUNTER — Other Ambulatory Visit: Payer: Self-pay | Admitting: *Deleted

## 2017-04-25 DIAGNOSIS — C3492 Malignant neoplasm of unspecified part of left bronchus or lung: Secondary | ICD-10-CM

## 2017-04-25 DIAGNOSIS — R590 Localized enlarged lymph nodes: Secondary | ICD-10-CM

## 2017-04-25 MED ORDER — OXYCODONE-ACETAMINOPHEN 5-325 MG PO TABS: 1.0000 | ORAL_TABLET | Freq: Three times a day (TID) | ORAL | 0 refills | Status: DC | PRN

## 2017-04-25 NOTE — Telephone Encounter (Signed)
Pt here at RN desk unannounced for her pain medication refill. Discussed with pt to have seat in main waiting room and I will look into the concern. Called [facility], insurance will not refill more than a 7 day supply for pain meds. Reviewed with MD, Refill on Rx written. Rx walked out to pt.

## 2017-04-25 NOTE — Telephone Encounter (Signed)
Pt called for an oxycodone refill. [facility] told her she needs a prior authorization on file. Otherwise they will only give 7 days worth of prescription.   Please call when ready for pickup. Called [facility] to clarify and they open at 0900.

## 2017-04-26 ENCOUNTER — Ambulatory Visit (HOSPITAL_COMMUNITY): Admission: RE | Admit: 2017-04-26 | Payer: 59 | Source: Ambulatory Visit

## 2017-04-27 ENCOUNTER — Other Ambulatory Visit (HOSPITAL_BASED_OUTPATIENT_CLINIC_OR_DEPARTMENT_OTHER): Payer: 59

## 2017-04-27 ENCOUNTER — Ambulatory Visit (HOSPITAL_BASED_OUTPATIENT_CLINIC_OR_DEPARTMENT_OTHER): Payer: 59

## 2017-04-27 ENCOUNTER — Ambulatory Visit (HOSPITAL_BASED_OUTPATIENT_CLINIC_OR_DEPARTMENT_OTHER): Payer: 59 | Admitting: Internal Medicine

## 2017-04-27 ENCOUNTER — Encounter: Payer: Self-pay | Admitting: Internal Medicine

## 2017-04-27 ENCOUNTER — Telehealth: Payer: Self-pay | Admitting: Internal Medicine

## 2017-04-27 VITALS — BP 123/77 | HR 99 | Temp 98.2°F | Resp 19 | Ht 67.5 in | Wt 199.7 lb

## 2017-04-27 DIAGNOSIS — Z5112 Encounter for antineoplastic immunotherapy: Secondary | ICD-10-CM | POA: Diagnosis not present

## 2017-04-27 DIAGNOSIS — C3492 Malignant neoplasm of unspecified part of left bronchus or lung: Secondary | ICD-10-CM

## 2017-04-27 DIAGNOSIS — C3412 Malignant neoplasm of upper lobe, left bronchus or lung: Secondary | ICD-10-CM

## 2017-04-27 LAB — CBC WITH DIFFERENTIAL/PLATELET
BASO%: 0.5 % (ref 0.0–2.0)
Basophils Absolute: 0 10*3/uL (ref 0.0–0.1)
EOS%: 1.8 % (ref 0.0–7.0)
Eosinophils Absolute: 0.1 10*3/uL (ref 0.0–0.5)
HCT: 43.8 % (ref 34.8–46.6)
HEMOGLOBIN: 14.6 g/dL (ref 11.6–15.9)
LYMPH%: 22.9 % (ref 14.0–49.7)
MCH: 31.7 pg (ref 25.1–34.0)
MCHC: 33.3 g/dL (ref 31.5–36.0)
MCV: 95 fL (ref 79.5–101.0)
MONO#: 0.5 10*3/uL (ref 0.1–0.9)
MONO%: 9 % (ref 0.0–14.0)
NEUT%: 65.8 % (ref 38.4–76.8)
NEUTROS ABS: 3.7 10*3/uL (ref 1.5–6.5)
Platelets: 256 10*3/uL (ref 145–400)
RBC: 4.61 10*6/uL (ref 3.70–5.45)
RDW: 12.3 % (ref 11.2–14.5)
WBC: 5.6 10*3/uL (ref 3.9–10.3)
lymph#: 1.3 10*3/uL (ref 0.9–3.3)

## 2017-04-27 LAB — COMPREHENSIVE METABOLIC PANEL
ALBUMIN: 4.4 g/dL (ref 3.5–5.0)
ALK PHOS: 110 U/L (ref 40–150)
ALT: 26 U/L (ref 0–55)
ANION GAP: 10 meq/L (ref 3–11)
AST: 23 U/L (ref 5–34)
BILIRUBIN TOTAL: 0.53 mg/dL (ref 0.20–1.20)
BUN: 12.6 mg/dL (ref 7.0–26.0)
CALCIUM: 9.9 mg/dL (ref 8.4–10.4)
CO2: 24 mEq/L (ref 22–29)
CREATININE: 0.8 mg/dL (ref 0.6–1.1)
Chloride: 104 mEq/L (ref 98–109)
EGFR: >60 mL/min/{1.73_m2} (ref >60)
Glucose: 94 mg/dl (ref 70–140)
Potassium: 4.1 mEq/L (ref 3.5–5.1)
Sodium: 138 mEq/L (ref 136–145)
Total Protein: 7.8 g/dL (ref 6.4–8.3)

## 2017-04-27 MED ORDER — SODIUM CHLORIDE 0.9 % IV SOLN
Freq: Once | INTRAVENOUS | Status: AC
  Administered 2017-04-27: 11:00:00 via INTRAVENOUS

## 2017-04-27 MED ORDER — DURVALUMAB 500 MG/10ML IV SOLN
10.0000 mg/kg | Freq: Once | INTRAVENOUS | Status: AC
  Administered 2017-04-27: 860 mg via INTRAVENOUS
  Filled 2017-04-27: qty 10

## 2017-04-27 NOTE — Patient Instructions (Signed)
[facility] Discharge Instructions for Patients Receiving Chemotherapy  Today you received the following chemotherapy agents Imfinzi.   To help prevent nausea and vomiting after your treatment, we encourage you to take your nausea medication as prescribed.    If you develop nausea and vomiting that is not controlled by your nausea medication, call the clinic.   BELOW ARE SYMPTOMS THAT SHOULD BE REPORTED IMMEDIATELY:  *FEVER GREATER THAN 100.5 F  *CHILLS WITH OR WITHOUT FEVER  NAUSEA AND VOMITING THAT IS NOT CONTROLLED WITH YOUR NAUSEA MEDICATION  *UNUSUAL SHORTNESS OF BREATH  *UNUSUAL BRUISING OR BLEEDING  TENDERNESS IN MOUTH AND THROAT WITH OR WITHOUT PRESENCE OF ULCERS  *URINARY PROBLEMS  *BOWEL PROBLEMS  UNUSUAL RASH Items with * indicate a potential emergency and should be followed up as soon as possible.  Feel free to call the clinic should you have any questions or concerns. The clinic phone number is [phone].  Please show the [facility] at check-in to the Emergency Department and triage nurse.

## 2017-04-27 NOTE — Progress Notes (Signed)
[facility] Telephone:([phone]   Fax:(336) [phone]  OFFICE PROGRESS NOTE  Debbie Fess, MD [place] [address]  DIAGNOSIS: Stage IIIA (T2a, N2, M0) non-small cell lung cancer, adenocarcinoma presented with left suprahilar mass, mediastinal lymphadenopathy and suspicious pulmonary nodule in the left upper lobe diagnosed in November 2017. No actionable mutations on Guardant 360 testing.  PRIOR THERAPY: Status post concurrent chemoradiation with weekly carboplatin for AUC of 2 and paclitaxel 45 MG/M2 for 7 cycles. Last dose was given 06/13/2016.  CURRENT THERAPY: Immunotherapy with Imfinzi (Durvalumab) 10 MG/KG every 2 weeks. First dose 08/11/2016. Status post 16 cycles.  INTERVAL HISTORY: Debbie Baker 51 y.o. female returns to the clinic today for follow-up visit.  The patient continues to have the pain in the right lower rib cage.  She also has fatigue and weakness.  She is tolerating her treatment with Imfinzi (Durvalumab) fairly well.  She is currently on Percocet for pain management but her insurance denied giving her more than 7 days at a time.  She denied having any fever or chills.  She has no nausea, vomiting, diarrhea or constipation.  She denied having any skin rash.  She is here today for evaluation before starting cycle #17.  She was supposed to have repeat CT scan of the chest before this visit but again because of insurance issues her scan is a scheduled for May 02, 2017.   MEDICAL HISTORY: Past Medical History:  Diagnosis Date  . Allergic rhinitis   . Deaf, left   . Diverticulosis    sigmoid colon  . Dyspnea   . Encounter for antineoplastic chemotherapy 04/18/2016  . Encounter for antineoplastic immunotherapy 07/21/2016  . GERD (gastroesophageal reflux disease)   . Hyperlipidemia   . Lung cancer ([facility])    non small cell lung cancer favoring adenocarcinoma   . Lung nodules   . Migraine   . Odynophagia 05/24/2016  .  Pneumonia   . PONV (postoperative nausea and vomiting)   . Tachycardia, paroxysmal ([facility]) 09/08/2016  . Wears glasses     ALLERGIES:  is allergic to penicillins; chantix [varenicline]; and levaquin [levofloxacin].  MEDICATIONS:  Current Outpatient Medications  Medication Sig Dispense Refill  . albuterol (PROVENTIL HFA;VENTOLIN HFA) 108 (90 Base) MCG/ACT inhaler Inhale 2 puffs into the lungs every 6 (six) hours as needed for wheezing or shortness of breath. 1 Inhaler 0  . azithromycin (ZITHROMAX Z-PAK) 250 MG tablet Take as directed on package 6 each 0  . benzonatate (TESSALON) 100 MG capsule Take 1 capsule (100 mg total) by mouth every 4 (four) hours as needed for cough. 60 capsule 1  . Calcium-Magnesium-Vitamin D 185-50-100 MG-MG-UNIT CAPS Take 1 tablet by mouth every evening.    . cetirizine (ZYRTEC) 10 MG tablet Take 10 mg by mouth daily.    . diphenhydrAMINE (BENADRYL) 25 mg capsule Take 25 mg by mouth at bedtime as needed.    . durvalumab (IMFINZI) 120 MG/2.4ML SOLN injection Inject 120 mg into the vein every 14 (fourteen) days.     Nyoka Cowden Tea, Camillia sinensis, (GREEN TEA EXTRACT PO) Take by mouth.    . Lactobacillus (PROBIOTIC ACIDOPHILUS) CAPS Take by mouth.    . MELATONIN GUMMIES PO Take by mouth.    . Milk Thistle 1000 MG CAPS Take 1 capsule by mouth daily.    . Misc Natural Products (BLACK COHOSH MENOPAUSE COMPLEX) TABS Take 1 capsule by mouth 2 (two) times daily.    Marland Kitchen  Misc Natural Products (TURMERIC CURCUMIN) CAPS Take 1 capsule by mouth every evening. 1500 mg capsule    . Multiple Vitamins-Minerals (WOMENS 50+ MULTI VITAMIN/MIN) TABS Take 1 tablet by mouth daily.    . naproxen sodium (ANAPROX) 220 MG tablet Take 220-440 mg by mouth 2 (two) times daily as needed (for pain).     Marland Kitchen OVER THE COUNTER MEDICATION Take 250 mg by mouth daily. Jiaogulan Supplement    . oxyCODONE-acetaminophen (PERCOCET/ROXICET) 5-325 MG tablet Take 1 tablet by mouth every 8 (eight) hours as needed  (Chronic pain management). 21 tablet 0  . pantoprazole (PROTONIX) 40 MG tablet Take 1 tablet (40 mg total) 2 (two) times daily by mouth. 180 tablet 3  . pravastatin (PRAVACHOL) 20 MG tablet Take 20 mg by mouth every evening.  3  . prochlorperazine (COMPAZINE) 10 MG tablet Take 1 tablet (10 mg total) by mouth every 6 (six) hours as needed for nausea or vomiting. 30 tablet 0  . ranitidine (ZANTAC) 150 MG tablet Take 150-300 mg by mouth every evening. 300 mg with more acidic meals     No current facility-administered medications for this visit.     SURGICAL HISTORY:  Past Surgical History:  Procedure Laterality Date  . ABDOMINAL HYSTERECTOMY    . VIDEO BRONCHOSCOPY Bilateral 07/03/2015   Procedure: VIDEO BRONCHOSCOPY WITH FLUORO;  Surgeon: Marshell Garfinkel, MD;  Location: [facility];  Service: Cardiopulmonary;  Laterality: Bilateral;  . VIDEO BRONCHOSCOPY WITH ENDOBRONCHIAL ULTRASOUND N/A 04/06/2016   Procedure: VIDEO BRONCHOSCOPY WITH ENDOBRONCHIAL ULTRASOUND;  Surgeon: Collene Gobble, MD;  Location: [facility];  Service: Thoracic;  Laterality: N/A;  . WRIST FRACTURE SURGERY      REVIEW OF SYSTEMS:  A comprehensive review of systems was negative except for: Constitutional: positive for fatigue Respiratory: positive for pleurisy/chest pain   PHYSICAL EXAMINATION: General appearance: alert, cooperative and no distress Head: Normocephalic, without obvious abnormality, atraumatic Neck: no adenopathy, no JVD, supple, symmetrical, trachea midline and thyroid not enlarged, symmetric, no tenderness/mass/nodules Lymph nodes: Cervical, supraclavicular, and axillary nodes normal. Resp: wheezes bilaterally Back: symmetric, no curvature. ROM normal. No CVA tenderness. Cardio: regular rate and rhythm, S1, S2 normal, no murmur, click, rub or gallop GI: soft, non-tender; bowel sounds normal; no masses,  no organomegaly Extremities: extremities normal, atraumatic, no cyanosis or edema  ECOG PERFORMANCE STATUS:  1 - Symptomatic but completely ambulatory  Blood pressure 123/77, pulse 99, temperature 98.2 F (36.8 C), temperature source Oral, resp. rate 19, height 5' 7.5" (1.715 m), weight 199 lb 11.2 oz (90.6 kg), SpO2 98 %.  LABORATORY DATA: Lab Results  Component Value Date   WBC 5.6 04/27/2017   HGB 14.6 04/27/2017   HCT 43.8 04/27/2017   MCV 95.0 04/27/2017   PLT 256 04/27/2017      Chemistry      Component Value Date/Time   NA 136 04/13/2017 0906   K 3.9 04/13/2017 0906   CL 107 04/06/2016 0742   CO2 22 04/13/2017 0906   BUN 11.9 04/13/2017 0906   CREATININE 0.8 04/13/2017 0906      Component Value Date/Time   CALCIUM 9.0 04/13/2017 0906   ALKPHOS 99 04/13/2017 0906   AST 20 04/13/2017 0906   ALT 22 04/13/2017 0906   BILITOT 0.48 04/13/2017 0906       RADIOGRAPHIC STUDIES: No results found.  ASSESSMENT AND PLAN:  This is a very pleasant 51 years old white female with unresectable a stage IIIA non-small cell lung cancer, adenocarcinoma status post a  course of concurrent chemoradiation with partial response. The patient is currently undergoing consolidation immunotherapy with Imfinzi (Durvalumab) status post 16 cycles. She tolerated the last cycle of her treatment well. I recommended for the patient to proceed with cycle #17 today as a scheduled. She is supposed to have repeat CT scan of the chest, abdomen and pelvis next week.  The patient will call for the results since she is leaving to [place] to be with her grandchild. She will come back for follow-up visit on May 18, 2017 for the next dose of her treatment as well as labs and follow-up visit. For pain management, she will continue on Percocet. The patient was advised to call immediately if she has any concerning symptoms in the interval. The patient voices understanding of current disease status and treatment options and is in agreement with the current care plan. All questions were answered. The patient knows to  call the clinic with any problems, questions or concerns. We can certainly see the patient much sooner if necessary.  Disclaimer: This note was dictated with voice recognition software. Similar sounding words can inadvertently be transcribed and may not be corrected upon review.

## 2017-04-27 NOTE — Telephone Encounter (Signed)
Scheduled appt per 11/29 los - Gave patient AVS and calender per los.  

## 2017-04-28 ENCOUNTER — Telehealth: Payer: Self-pay | Admitting: Medical Oncology

## 2017-04-28 NOTE — Telephone Encounter (Signed)
Faxed last ct chest to insurance for reconsideration.

## 2017-05-01 ENCOUNTER — Telehealth: Payer: Self-pay | Admitting: Internal Medicine

## 2017-05-01 ENCOUNTER — Telehealth: Payer: Self-pay | Admitting: *Deleted

## 2017-05-01 DIAGNOSIS — R590 Localized enlarged lymph nodes: Secondary | ICD-10-CM

## 2017-05-01 DIAGNOSIS — C3492 Malignant neoplasm of unspecified part of left bronchus or lung: Secondary | ICD-10-CM

## 2017-05-01 MED ORDER — OXYCODONE-ACETAMINOPHEN 5-325 MG PO TABS: 1.0000 | ORAL_TABLET | Freq: Three times a day (TID) | ORAL | 0 refills | Status: DC | PRN

## 2017-05-01 NOTE — Telephone Encounter (Signed)
Pt called for refill on Pain medication, request for 10 day supply as she will be going out of town. Pt understands her insurance will only cover 7days, pt will pay out of pocket remaining balance.

## 2017-05-01 NOTE — Telephone Encounter (Signed)
05/01/2017 faxed medical records to [facility] @ [phone]

## 2017-05-02 ENCOUNTER — Ambulatory Visit (HOSPITAL_COMMUNITY)
Admission: RE | Admit: 2017-05-02 | Discharge: 2017-05-02 | Disposition: A | Discharge status: Home / Self Care | Payer: 59 | Source: Ambulatory Visit | Attending: Internal Medicine | Admitting: Internal Medicine

## 2017-05-02 ENCOUNTER — Ambulatory Visit (HOSPITAL_COMMUNITY): Payer: 59

## 2017-05-02 ENCOUNTER — Telehealth: Payer: Self-pay | Admitting: Gastroenterology

## 2017-05-02 ENCOUNTER — Other Ambulatory Visit: Payer: Self-pay

## 2017-05-02 DIAGNOSIS — R1013 Epigastric pain: Secondary | ICD-10-CM

## 2017-05-02 DIAGNOSIS — C3490 Malignant neoplasm of unspecified part of unspecified bronchus or lung: Secondary | ICD-10-CM | POA: Diagnosis present

## 2017-05-02 MED ORDER — GADOBENATE DIMEGLUMINE 529 MG/ML IV SOLN
20.0000 mL | Freq: Once | INTRAVENOUS | Status: AC | PRN
  Administered 2017-05-02: 18 mL via INTRAVENOUS

## 2017-05-02 NOTE — Telephone Encounter (Signed)
Yes we can order it for her. Dr. Earlie Server had wanted it done. You can say for the diagnosis is epigastric pain, history of lung cancer. Thanks

## 2017-05-02 NOTE — Telephone Encounter (Signed)
Called and scheduled CT for Monday,  Dec.17,2018, at 1:00pm. Arrive at 12:45pm  Pt needs to drink contrast at 11:00am and 12:00pm. Pt is to have no solid foods 4 hours prior to procedure but is able to drink.   Called and spoke to pt. Relayed all information and she expressed understanding. She already has 2 bottles of contrast from prior CT that was cancelled. I gave pt the phone number and address of [place] [facility] in case she needs to reschedule. All questions answered.

## 2017-05-02 NOTE — Telephone Encounter (Signed)
Patient states she will not be in town all week next week and would like to scheduled scans with the nurse.

## 2017-05-02 NOTE — Telephone Encounter (Signed)
Dr. Havery Moros - pt states her insurance won't cover the CT that was ordered by Dr. Inda Merlin but would cover if it is ordered by GI.  Is it OK to order a CT scan for pt?  If so, should it be for "abdomen and pelvis with contrast"? Thank you

## 2017-05-08 ENCOUNTER — Institutional Professional Consult (permissible substitution): Payer: Self-pay | Admitting: Radiation Oncology

## 2017-05-11 ENCOUNTER — Telehealth: Payer: Self-pay | Admitting: Internal Medicine

## 2017-05-11 NOTE — Telephone Encounter (Signed)
Faxed records to [facility] risk adjustment

## 2017-05-12 NOTE — Progress Notes (Signed)
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 Julien Nordmann 04-27-17 Radiation referral  Immunotherapy with Imfinzi (Durvalumab) 10 MG/KG every 2 weeks. First dose 08/11/2016. Status post 16 cycle  Status post concurrent chemoradiation with weekly carboplatin for AUC of 2 and paclitaxel 45 MG/M2 for 7 cycles. Last dose was given 06/13/2016.   Dose of Decadron, if applicable:No   Recent neurologic symptoms, if any:   Seizures: No  Headaches: Yes occasional they come and go  Nausea: No  Dizziness/ataxia: Has positional vertigo  Difficulty with hand coordination:No  Focal numbness/weakness: Numbness from elbow down while sleeping at times and it's not the arm that she sleeps on  Visual deficits/changes: No visual issues,Wears glasses  Confusion/Memory deficits: 'States she has memory problems that it runs in her family." Alert and oriented this afternoon.  Painful bone metastases at present, if any: Right lower rib cage is new and left rib cage pain ongoing     Percocet for pain management.  SAFETY ISSUES: [facility] radiation? : Yes 05-02-16  06-17-16 mediastinal adenopathy were treated to 66 Gy in 33 fractions of 2 Gy.  Pacemaker/ICD? : No  Possible current pregnancy? : No  Is the patient on methotrexate? : No  Additional Complaints / other details: Mother died 06-07-2017 from breast cancer age 31 Wt Readings from Last 3 Encounters:  05/18/17 199 lb 8 oz (90.5 kg)  05/18/17 199 lb 8 oz (90.5 kg)  04/27/17 199 lb 11.2 oz (90.6 kg)  BP 130/84   Pulse (!) 101   Temp 98.4 F (36.9 C) (Oral)   Resp 18   Ht 5' 7.5" (1.715 m)   Wt 199 lb 8 oz (90.5 kg)   SpO2 98%   BMI 30.78 kg/m

## 2017-05-15 ENCOUNTER — Other Ambulatory Visit: Payer: Self-pay | Admitting: Medical Oncology

## 2017-05-15 ENCOUNTER — Inpatient Hospital Stay: Admission: RE | Admit: 2017-05-15 | Payer: 59 | Source: Ambulatory Visit

## 2017-05-15 DIAGNOSIS — R590 Localized enlarged lymph nodes: Secondary | ICD-10-CM

## 2017-05-15 DIAGNOSIS — C3492 Malignant neoplasm of unspecified part of left bronchus or lung: Secondary | ICD-10-CM

## 2017-05-15 MED ORDER — OXYCODONE-ACETAMINOPHEN 5-325 MG PO TABS: 1.0000 | ORAL_TABLET | Freq: Three times a day (TID) | ORAL | 0 refills | Status: DC | PRN

## 2017-05-17 ENCOUNTER — Ambulatory Visit (INDEPENDENT_AMBULATORY_CARE_PROVIDER_SITE_OTHER)
Admission: RE | Admit: 2017-05-17 | Discharge: 2017-05-17 | Disposition: A | Discharge status: Home / Self Care | Payer: 59 | Source: Ambulatory Visit | Attending: Gastroenterology | Admitting: Gastroenterology

## 2017-05-17 DIAGNOSIS — R1013 Epigastric pain: Secondary | ICD-10-CM

## 2017-05-17 MED ORDER — IOPAMIDOL (ISOVUE-300) INJECTION 61%
100.0000 mL | Freq: Once | INTRAVENOUS | Status: AC | PRN
  Administered 2017-05-17: 100 mL via INTRAVENOUS

## 2017-05-18 ENCOUNTER — Encounter: Payer: Self-pay | Admitting: Oncology

## 2017-05-18 ENCOUNTER — Ambulatory Visit
Admission: RE | Admit: 2017-05-18 | Discharge: 2017-05-18 | Disposition: A | Discharge status: Home / Self Care | Payer: 59 | Source: Ambulatory Visit | Attending: Radiation Oncology | Admitting: Radiation Oncology

## 2017-05-18 ENCOUNTER — Encounter: Payer: Self-pay | Admitting: Urology

## 2017-05-18 ENCOUNTER — Other Ambulatory Visit: Payer: Self-pay

## 2017-05-18 ENCOUNTER — Ambulatory Visit (HOSPITAL_BASED_OUTPATIENT_CLINIC_OR_DEPARTMENT_OTHER): Payer: 59 | Admitting: Oncology

## 2017-05-18 ENCOUNTER — Encounter: Payer: Self-pay | Admitting: General Practice

## 2017-05-18 ENCOUNTER — Other Ambulatory Visit: Payer: Self-pay | Admitting: Radiology

## 2017-05-18 ENCOUNTER — Ambulatory Visit
Admission: RE | Admit: 2017-05-18 | Discharge: 2017-05-18 | Disposition: A | Discharge status: Home / Self Care | Payer: 59 | Source: Ambulatory Visit | Attending: Urology | Admitting: Urology

## 2017-05-18 ENCOUNTER — Ambulatory Visit (HOSPITAL_COMMUNITY)
Admission: RE | Admit: 2017-05-18 | Discharge: 2017-05-18 | Disposition: A | Discharge status: Home / Self Care | Payer: 59 | Source: Ambulatory Visit | Attending: Oncology | Admitting: Oncology

## 2017-05-18 ENCOUNTER — Other Ambulatory Visit (HOSPITAL_BASED_OUTPATIENT_CLINIC_OR_DEPARTMENT_OTHER): Payer: 59

## 2017-05-18 ENCOUNTER — Encounter (HOSPITAL_COMMUNITY): Payer: Self-pay

## 2017-05-18 ENCOUNTER — Telehealth: Payer: Self-pay | Admitting: Oncology

## 2017-05-18 ENCOUNTER — Ambulatory Visit: Payer: 59

## 2017-05-18 VITALS — BP 130/84 | HR 101 | Temp 98.4°F | Resp 18 | Ht 67.5 in | Wt 199.5 lb

## 2017-05-18 DIAGNOSIS — Z881 Allergy status to other antibiotic agents status: Secondary | ICD-10-CM | POA: Insufficient documentation

## 2017-05-18 DIAGNOSIS — C787 Secondary malignant neoplasm of liver and intrahepatic bile duct: Secondary | ICD-10-CM | POA: Insufficient documentation

## 2017-05-18 DIAGNOSIS — R51 Headache: Secondary | ICD-10-CM | POA: Insufficient documentation

## 2017-05-18 DIAGNOSIS — Z888 Allergy status to other drugs, medicaments and biological substances status: Secondary | ICD-10-CM | POA: Diagnosis not present

## 2017-05-18 DIAGNOSIS — Z85118 Personal history of other malignant neoplasm of bronchus and lung: Secondary | ICD-10-CM | POA: Insufficient documentation

## 2017-05-18 DIAGNOSIS — Z79891 Long term (current) use of opiate analgesic: Secondary | ICD-10-CM | POA: Diagnosis not present

## 2017-05-18 DIAGNOSIS — C3492 Malignant neoplasm of unspecified part of left bronchus or lung: Secondary | ICD-10-CM

## 2017-05-18 DIAGNOSIS — Z90710 Acquired absence of both cervix and uterus: Secondary | ICD-10-CM | POA: Insufficient documentation

## 2017-05-18 DIAGNOSIS — Z5112 Encounter for antineoplastic immunotherapy: Secondary | ICD-10-CM

## 2017-05-18 DIAGNOSIS — I700 Atherosclerosis of aorta: Secondary | ICD-10-CM | POA: Insufficient documentation

## 2017-05-18 DIAGNOSIS — C7931 Secondary malignant neoplasm of brain: Secondary | ICD-10-CM | POA: Diagnosis not present

## 2017-05-18 DIAGNOSIS — Z880 Allergy status to penicillin: Secondary | ICD-10-CM | POA: Diagnosis not present

## 2017-05-18 DIAGNOSIS — R918 Other nonspecific abnormal finding of lung field: Secondary | ICD-10-CM | POA: Diagnosis not present

## 2017-05-18 DIAGNOSIS — M549 Dorsalgia, unspecified: Secondary | ICD-10-CM

## 2017-05-18 DIAGNOSIS — R0602 Shortness of breath: Secondary | ICD-10-CM | POA: Insufficient documentation

## 2017-05-18 DIAGNOSIS — C3412 Malignant neoplasm of upper lobe, left bronchus or lung: Secondary | ICD-10-CM | POA: Diagnosis not present

## 2017-05-18 DIAGNOSIS — Z79899 Other long term (current) drug therapy: Secondary | ICD-10-CM | POA: Insufficient documentation

## 2017-05-18 DIAGNOSIS — R590 Localized enlarged lymph nodes: Secondary | ICD-10-CM | POA: Diagnosis not present

## 2017-05-18 DIAGNOSIS — Z7952 Long term (current) use of systemic steroids: Secondary | ICD-10-CM | POA: Diagnosis not present

## 2017-05-18 LAB — COMPREHENSIVE METABOLIC PANEL
ALBUMIN: 4.2 g/dL (ref 3.5–5.0)
ALT: 26 U/L (ref 0–55)
AST: 25 U/L (ref 5–34)
Alkaline Phosphatase: 99 U/L (ref 40–150)
Anion Gap: 12 mEq/L — ABNORMAL HIGH (ref 3–11)
BUN: 6.9 mg/dL — AB (ref 7.0–26.0)
CHLORIDE: 106 meq/L (ref 98–109)
CO2: 22 meq/L (ref 22–29)
Calcium: 9.8 mg/dL (ref 8.4–10.4)
Creatinine: 0.8 mg/dL (ref 0.6–1.1)
GLUCOSE: 96 mg/dL (ref 70–140)
POTASSIUM: 4.4 meq/L (ref 3.5–5.1)
SODIUM: 139 meq/L (ref 136–145)
Total Bilirubin: 0.39 mg/dL (ref 0.20–1.20)
Total Protein: 7.6 g/dL (ref 6.4–8.3)

## 2017-05-18 LAB — CBC WITH DIFFERENTIAL/PLATELET
BASO%: 0.5 % (ref 0.0–2.0)
BASOS ABS: 0 10*3/uL (ref 0.0–0.1)
EOS ABS: 0.2 10*3/uL (ref 0.0–0.5)
EOS%: 2.9 % (ref 0.0–7.0)
HEMATOCRIT: 42.1 % (ref 34.8–46.6)
HEMOGLOBIN: 14.2 g/dL (ref 11.6–15.9)
LYMPH#: 1.4 10*3/uL (ref 0.9–3.3)
LYMPH%: 23.9 % (ref 14.0–49.7)
MCH: 31.8 pg (ref 25.1–34.0)
MCHC: 33.7 g/dL (ref 31.5–36.0)
MCV: 94.4 fL (ref 79.5–101.0)
MONO#: 0.5 10*3/uL (ref 0.1–0.9)
MONO%: 9 % (ref 0.0–14.0)
NEUT#: 3.7 10*3/uL (ref 1.5–6.5)
NEUT%: 63.7 % (ref 38.4–76.8)
PLATELETS: 283 10*3/uL (ref 145–400)
RBC: 4.46 10*6/uL (ref 3.70–5.45)
RDW: 12.2 % (ref 11.2–14.5)
WBC: 5.8 10*3/uL (ref 3.9–10.3)
nRBC: 0 % (ref 0–0)

## 2017-05-18 LAB — TSH: TSH: 3.316 m(IU)/L (ref 0.308–3.960)

## 2017-05-18 MED ORDER — IOPAMIDOL (ISOVUE-300) INJECTION 61%
75.0000 mL | Freq: Once | INTRAVENOUS | Status: AC | PRN
  Administered 2017-05-18: 75 mL via INTRAVENOUS

## 2017-05-18 MED ORDER — LORAZEPAM 0.5 MG PO TABS: 0.5000 mg | ORAL_TABLET | ORAL | 0 refills | Status: AC | PRN

## 2017-05-18 MED ORDER — IOPAMIDOL (ISOVUE-300) INJECTION 61%
INTRAVENOUS | Status: AC
  Filled 2017-05-18: qty 75

## 2017-05-18 NOTE — Assessment & Plan Note (Signed)
This is a very pleasant 51 year old white female with unresectable a stage IIIA non-small cell lung cancer, adenocarcinoma status post a course of concurrent chemoradiation with partial response. The patient is currently undergoing consolidation immunotherapy with Imfinzi (Durvalumab) status post 17 cycles. She tolerated the last cycle of her treatment well, her, she has increased fatigue and shortness of breath.  The patient was seen with Dr. Julien Nordmann.  CT scan results of the abdomen and pelvis as well as MRI of the brain were discussed with the patient that she has a new central right hepatic lobe lesion and a left hepatic lobe lesion likely due to hepatic metastasis.  Visualized lower lung fields show interval increase in the size of the pleural-based nodules in the left and right lower lobes which are concerning for cancer recurrence.  MRI of the brain results showed 2 new subcentimeter lesions in the cerebellum which are suspicious for metastases.  Recommend that she discontinue her treatment with Imfinzi at this time.  The patient has an appointment with radiation oncology later today and they can discuss possible SRS to the brain lesions.  Unfortunately, the CT of the chest was still not approved by the patient's insurance company.  We do need this to complete her staging workup and the fact the patient is having increased shortness of breath.  A stat CT of the chest was ordered today.  We recommend proceeding with an ultrasound-guided liver biopsy to obtain tissue to send for molecular studies.  Several phone calls were made on behalf of the patient in interventional radiology will likely be able to perform his procedure tomorrow at [facility].  They will contact the patient directly.  Pending the results of this biopsy, may consider the patient for targeted therapy if a mutation is identified versus treatment with systemic chemotherapy consisting of carboplatin for an AUC of 5, Alimta 500 mg meter  squared, and Avastin.  She was given information about these medications today. Further treatment plan pending the results of the biopsy.  The patient will have to go out of town due to the death of her mother and she has a second opinion at [facility] on January 4.  We will plan to bring the patient back after she returns from out of state and after her second opinion to discuss treatment options.  For pain management, she will continue on Percocet.  The patient was advised to call immediately if she has any concerning symptoms in the interval. The patient voices understanding of current disease status and treatment options and is in agreement with the current care plan. All questions were answered. The patient knows to call the clinic with any problems, questions or concerns. We can certainly see the patient much sooner if necessary.

## 2017-05-18 NOTE — Addendum Note (Signed)
Encounter addended by: Malena Edman, RN on: 05/18/2017 5:25 PM  Actions taken: Charge Capture section accepted

## 2017-05-18 NOTE — Progress Notes (Signed)
Radiation Oncology         [phone] ________________________________  Outpatient Re-Consultation  Name: Debbie Baker MRN: [id number]  Date of Service: 05/18/2017 DOB: 21-Mar-1966  [id number], Lennette Bihari, MD  Curt Bears, MD   REFERRING PHYSICIAN: Curt Bears, MD  DIAGNOSIS: The encounter diagnosis was Adenocarcinoma of left lung, stage 3 ([place]).    ICD-10-CM   1. Adenocarcinoma of left lung, stage 3 ([facility]) C34.92 Ambulatory referral to Social Work    HISTORY OF PRESENT ILLNESS: Debbie Baker is a 51 y.o. female seen at the request of Dr. Julien Nordmann.  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 2017.  She was treated with concurrent chemoradiation with weekly carboplatin and paclitaxel- completed 7 cycles on 06/13/2016 and radiation was completed 06/17/16. Post-treatment CT scans showed a partial response to treatment with evolving post-radiation changes and a decrease in the infiltrative soft tissue mass in the anterior left hilum and left lower paratracheal mediastinum. She was started on consolodation treatment with Imfinzi on 08/11/16 which she has remained on- currently s/p 17 cycles.  Unfortunately, her recent imaging studies have shown evidence of disease progression.    Most recent CT A/P from 05/17/17 showed a new central RIGHT hepatic lobe lesion most consistent with hepatic metastasis, a second low-density lesion in the LEFT hepatic lobe concerning for metastatic lesion and interval increase in size of pleural-based nodules in the LEFT and RIGHT lower lobes concerning for lung cancer recurrence. CT Chest performed 05/18/17 confirms evidence of progression of disease, with increased in number and size of numerous pulmonary nodules, findings concerning for probable pericardial metastasis, and new hepatic lesions indicative of metastatic disease to the liver.  She is scheduled for CT guided core needle biopsy of the liver on  05/19/17. Pending the results of this biopsy, may consider the patient for targeted therapy if a mutation is identified versus treatment with systemic chemotherapy consisting of carboplatin for an AUC of 5, Alimta 500 mg meter squared, and Avastin.  An MRI of the brain was performed 05/02/17 for evaluation of recent complaints of memory loss and increased simple mistakes at work. This scan showed 2 new small foci of enhancement in the cerebellum which measure 2 mm on the right and 5 mm on the left. There is no associated edema or diffusion abnormalities and no definite enhancing supratentorial lesions identified. These new lesions are suspicious for metastasis although subacute infarcts are additional consideration. The recommendation is for repeat imaging in 4-6 weeks.  She has been under an incredible amount of stress recently with the loss of her mother to metastatic breast cancer on Tuesday 05/16/17 after only finding out that the breast cancer had spread to the liver 1 week prior.  She will be traveling back to [place] for her funeral services on 12/29 and 05/28/17.    She presents today to review the recent findings on the MRI brain and discuss the potential role of radiotherapy in the treatment of her disease.  PREVIOUS RADIATION THERAPY: Yes 05/02/16-06/17/16:  The primary tumor in the LUL and involved mediastinal adenopathy were treated to 66 Gy in 33 fractions of 2 Gy.  PAST MEDICAL HISTORY:  Past Medical History:  Diagnosis Date  . Allergic rhinitis   . Deaf, left   . Diverticulosis    sigmoid colon  . Dyspnea   . Encounter for antineoplastic chemotherapy 04/18/2016  . Encounter for antineoplastic immunotherapy 07/21/2016  .  GERD (gastroesophageal reflux disease)   . Hyperlipidemia   . Lung cancer ([place]) 03/2016   non small cell lung cancer favoring adenocarcinoma   . Lung nodules   . Migraine   . Odynophagia 05/24/2016  . Pneumonia   . PONV (postoperative nausea and vomiting)   .  Tachycardia, paroxysmal ([place]) 09/08/2016  . Wears glasses       PAST SURGICAL HISTORY: Past Surgical History:  Procedure Laterality Date  . ABDOMINAL HYSTERECTOMY    . VIDEO BRONCHOSCOPY Bilateral 07/03/2015   Procedure: VIDEO BRONCHOSCOPY WITH FLUORO;  Surgeon: Marshell Garfinkel, MD;  Location: [facility];  Service: Cardiopulmonary;  Laterality: Bilateral;  . VIDEO BRONCHOSCOPY WITH ENDOBRONCHIAL ULTRASOUND N/A 04/06/2016   Procedure: VIDEO BRONCHOSCOPY WITH ENDOBRONCHIAL ULTRASOUND;  Surgeon: Collene Gobble, MD;  Location: [facility];  Service: Thoracic;  Laterality: N/A;  . WRIST FRACTURE SURGERY      FAMILY HISTORY:  Family History  Problem Relation Age of Onset  . Esophageal cancer Father   . Breast cancer Mother     SOCIAL HISTORY:  Social History   Socioeconomic History  . Marital status: Single    Spouse name: Not on file  . Number of children: Not on file  . Years of education: Not on file  . Highest education level: Not on file  Social Needs  . Financial resource strain: Not on file  . Food insecurity - worry: Not on file  . Food insecurity - inability: Not on file  . Transportation needs - medical: Not on file  . Transportation needs - non-medical: Not on file  Occupational History  . Not on file  Tobacco Use  . Smoking status: Former Smoker    Packs/day: 1.50    Years: 30.00    Pack years: 45.00    Types: Cigarettes    Last attempt to quit: 08/02/2010    Years since quitting: 6.7  . Smokeless tobacco: Never Used  Substance and Sexual Activity  . Alcohol use: Yes    Alcohol/week: 0.0 oz    Comment: 1-2 drinks daily  . Drug use: Yes    Types: Marijuana    Comment: last use 02/2016  . Sexual activity: Yes    Birth control/protection: Surgical  Other Topics Concern  . Not on file  Social History Narrative   10 cats   Lives with boyfriend   Not employed   Former employee of [employer]   3 children living in [place]: Penicillins;  Chantix [varenicline]; and Levaquin [levofloxacin]  MEDICATIONS:  Current Outpatient Medications  Medication Sig Dispense Refill  . benzonatate (TESSALON) 100 MG capsule Take 1 capsule (100 mg total) by mouth every 4 (four) hours as needed for cough. 60 capsule 1  . Calcium-Magnesium-Vitamin D 185-50-100 MG-MG-UNIT CAPS Take 1 tablet by mouth every evening.    . cetirizine (ZYRTEC) 10 MG tablet Take 10 mg by mouth daily.    . diphenhydrAMINE (BENADRYL) 25 mg capsule Take 25 mg by mouth at bedtime as needed.    . durvalumab (IMFINZI) 120 MG/2.4ML SOLN injection Inject 120 mg into the vein every 14 (fourteen) days.     Nyoka Cowden Tea, Camillia sinensis, (GREEN TEA EXTRACT PO) Take by mouth.    . Lactobacillus (PROBIOTIC ACIDOPHILUS) CAPS Take by mouth.    . Milk Thistle 1000 MG CAPS Take 1 capsule by mouth daily.    . Misc Natural Products (BLACK COHOSH MENOPAUSE COMPLEX) TABS Take 1 capsule by mouth 2 (  two) times daily.    . Misc Natural Products (TURMERIC CURCUMIN) CAPS Take 1 capsule by mouth every evening. 1500 mg capsule    . Multiple Vitamins-Minerals (WOMENS 50+ MULTI VITAMIN/MIN) TABS Take 1 tablet by mouth daily.    Marland Kitchen OVER THE COUNTER MEDICATION Take 250 mg by mouth daily. Jiaogulan Supplement    . oxyCODONE-acetaminophen (PERCOCET/ROXICET) 5-325 MG tablet Take 1 tablet by mouth every 8 (eight) hours as needed (Chronic pain management). 30 tablet 0  . pantoprazole (PROTONIX) 40 MG tablet Take 1 tablet (40 mg total) 2 (two) times daily by mouth. 180 tablet 3  . pravastatin (PRAVACHOL) 20 MG tablet Take 20 mg by mouth every evening.  3  . albuterol (PROVENTIL HFA;VENTOLIN HFA) 108 (90 Base) MCG/ACT inhaler Inhale 2 puffs into the lungs every 6 (six) hours as needed for wheezing or shortness of breath. (Patient not taking: Reported on 05/18/2017) 1 Inhaler 0  . LORazepam (ATIVAN) 0.5 MG tablet Take 1 tablet (0.5 mg total) by mouth as needed for anxiety (take 30 min prior to MRI scans and  treatments). 30 tablet 0  . prochlorperazine (COMPAZINE) 10 MG tablet Take 1 tablet (10 mg total) by mouth every 6 (six) hours as needed for nausea or vomiting. (Patient not taking: Reported on 05/18/2017) 30 tablet 0   No current facility-administered medications for this encounter.    Facility-Administered Medications Ordered in Other Encounters  Medication Dose Route Frequency Provider Last Rate Last Dose  . iopamidol (ISOVUE-300) 61 % injection             REVIEW OF SYSTEMS:  On review of systems, the patient reports that she is doing well overall. She denies any chest pain, shortness of breath, cough, fevers, chills, night sweats, unintended weight changes. She denies any bowel or bladder disturbances, and denies persistent abdominal pain, nausea or vomiting. She has noticed twinges of pain in the right upper quadrant of her abdomen over the past several weeks and has chronic pain in the left flank into the LUQ abdomen. She has chronic back pain/hypersensitivity to light touch which makes sitting and or lying flat for any period of time uncomfortable.  She is even uncomfortable lying in her own bed.  She associates these symptoms with her systemic treatment since they were not present prior to treatment.  The sxs are not progressively worsening but are not improving either.  She takes Percocet q 6hrs which provides minimal relief and in fact, causes a mild headache approximately 20 minutes after taking the medication and then spontaneously resolves within a half hour.  She denies headaches otherwise and has not had any recent visual changes, blurred or double vision, tinnitus, imbalance or weakness in the upper or lower extremities.  She has not noticed any changes in her speech pattern or difficulty word finding.  She denies tremor or seizure activity.  A complete review of systems is obtained and is otherwise negative.  PHYSICAL EXAM:  Wt Readings from Last 3 Encounters:  05/18/17 199 lb 8 oz  (90.5 kg)  05/18/17 199 lb 8 oz (90.5 kg)  04/27/17 199 lb 11.2 oz (90.6 kg)   Temp Readings from Last 3 Encounters:  05/18/17 98.4 F (36.9 C) (Oral)  05/18/17 98.4 F (36.9 C) (Oral)  04/27/17 98.2 F (36.8 C) (Oral)   BP Readings from Last 3 Encounters:  05/18/17 130/84  05/18/17 130/84  04/27/17 123/77   Pulse Readings from Last 3 Encounters:  05/18/17 (!) 101  05/18/17 (!) 101  04/27/17 99   Pain Assessment Pain Score: 4  Pain Loc: Abdomen(Both sides behind the lungs)/10  In general this is a well appearing caucasian female in no acute distress. She is alert and oriented x4 and appropriate throughout the examination. HEENT reveals that the patient is normocephalic, atraumatic. EOMs are intact. PERRLA. Skin is intact without any evidence of gross lesions. Cardiovascular exam reveals a regular rate and rhythm, no clicks rubs or murmurs are auscultated. Chest is clear to auscultation bilaterally. Lymphatic assessment is performed and does not reveal any adenopathy in the cervical, supraclavicular, axillary, or inguinal chains. Abdomen has active bowel sounds in all quadrants and is intact. The abdomen is soft, non tender, non distended. Lower extremities are negative for pretibial pitting edema, deep calf tenderness, cyanosis or clubbing.  KPS = 90  100 - Normal; no complaints; no evidence of disease. 90   - Able to carry on normal activity; minor signs or symptoms of disease. 80   - Normal activity with effort; some signs or symptoms of disease. 80   - Cares for self; unable to carry on normal activity or to do active work. 60   - Requires occasional assistance, but is able to care for most of his personal needs. 50   - Requires considerable assistance and frequent medical care. 68   - Disabled; requires special care and assistance. 7   - Severely disabled; hospital admission is indicated although death not imminent. 54   - Very sick; hospital admission necessary; active  supportive treatment necessary. 10   - Moribund; fatal processes progressing rapidly. 0     - Dead  Karnofsky DA, Abelmann [place], Craver LS and Burchenal [facility] [phone]) The use of the nitrogen mustards in the palliative treatment of carcinoma: with particular reference to bronchogenic carcinoma Cancer 1 634-56  LABORATORY DATA:  Lab Results  Component Value Date   WBC 5.8 05/18/2017   HGB 14.2 05/18/2017   HCT 42.1 05/18/2017   MCV 94.4 05/18/2017   PLT 283 05/18/2017   Lab Results  Component Value Date   NA 139 05/18/2017   K 4.4 05/18/2017   CL 107 04/06/2016   CO2 22 05/18/2017   Lab Results  Component Value Date   ALT 26 05/18/2017   AST 25 05/18/2017   ALKPHOS 99 05/18/2017   BILITOT 0.39 05/18/2017     RADIOGRAPHY: Ct Chest W Contrast  Result Date: 05/18/2017 CLINICAL DATA:  51 year old female complaining of shortness of breath for the past 1.5 months and sternal pain for the past 2 months. Intermittent hemoptysis. 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 EXAM: CT CHEST WITH CONTRAST TECHNIQUE: Multidetector CT imaging of the chest was performed during intravenous contrast administration. CONTRAST:  19mL ISOVUE-300 IOPAMIDOL (ISOVUE-300) INJECTION 61% COMPARISON:  75 mL of Isovue-300. FINDINGS: Cardiovascular: Heart size is normal. Increasing areas of pericardial thickening and nodularity. This is most evident just lateral to the left side of the pulmonic trunk (axial image 59 of series 2, and adjacent to the left ventricular apex (axial image 90 of series 2. There is a new nodular area of pericardial thickening on axial image 90 of series 2 measuring 12 x 8 mm. These findings suggest developing pericardial metastasis, presumably from direct invasion from the previously demonstrated left hilar mass. Atherosclerosis in the thoracic aorta. No coronary  artery calcifications. Mediastinum/Nodes: Enlarged high right paratracheal lymph  node measuring 17 mm in short axis with low-attenuation center. Prominent soft tissue in the left hilar region, without discrete enlarged lymph node. New prominent but nonenlarged lymph node measuring 8 mm in short axis adjacent to the low-attenuation lower esophagus (axial image 97 of series 2). Low left paratracheal lymph node measuring 11 mm in short axis, similar to the prior study. Esophagus is unremarkable in appearance. No axillary lymphadenopathy. 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 However, the areas of nodularity in the inferior segment of the lingula appear increased compared to the prior examination. The largest of these is on axial image 79 of series 7, currently measuring 19 x 14 mm (previously 14 x 10 mm when measured in a similar fashion on exam 01/31/2017. Another nodule currently measures 16 x 10 mm (axial image 73 of series 7), previously 7 x 10 mm when measured in a similar fashion on the prior exam. New nodules are also noted in the inferior segment of the lingula, largest of which measures 7 mm on axial image 73 of series 7. Previously noted subpleural nodule in the posteromedial aspect of the right lower lobe currently measures 12 x 6 mm (previously 6 mm). Other new and enlarging nodules are also noted, including a new 5 mm nodule in the right lower lobe (axial image 135 of series 7), a left lower lobe nodule (image 104 of series 7) which currently measures 5 x 6 mm (previously only 3 mm), and a 4 mm right lower lobe nodule (axial image 98 of series 7) which previously measured only 2 mm. Tiny calcified granuloma in the right upper lobe incidentally noted. No acute consolidative airspace disease. Trace left pleural effusion partially loculated superiorly  and lying dependently inferiorly, similar to the prior examination. No right pleural effusion. Upper Abdomen: Several new hypovascular hepatic lesions are noted, highly concerning for new metastatic disease to the liver. The largest of these is in the central aspect of segment 8 (axial image 138 of series 2) measuring 1.9 x 2.7 cm. Subcentimeter low-attenuation lesions in both kidneys are too small to definitively characterize, but are statistically likely to represent tiny cysts. Aortic atherosclerosis. Musculoskeletal: There are no aggressive appearing lytic or blastic lesions noted in the visualized portions of the skeleton. IMPRESSION: 1. 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 2. Additional incidental findings, as above. Aortic Atherosclerosis (ICD10-I70.0). Electronically Signed   By: Vinnie Langton M.D.   On: 05/18/2017 11:35   Mr Jeri Cos XF Contrast  Result Date: 05/02/2017 CLINICAL DATA:  Non-small cell lung cancer.  Staging. EXAM: MRI HEAD WITHOUT AND WITH CONTRAST TECHNIQUE: Multiplanar, multiecho pulse sequences of the brain and surrounding structures were obtained without and with intravenous contrast. CONTRAST:  18 mL MultiHance COMPARISON:  04/23/2016 FINDINGS: Brain: There is no evidence of acute infarct, intracranial hemorrhage, midline shift, or extra-axial fluid collection. The ventricles and sulci are within normal limits for age. Small developmental venous anomalies are again seen in both frontal lobes. There are 2 new small foci of enhancement in the cerebellum which measure 2 mm on the right (series 15, image 13) and 5 mm on the left (series 15, image 14). There is faint FLAIR hyperintensity associated with the left cerebellar focus,  however there is no significant edema and  there is no associated diffusion abnormality. No definite enhancing supratentorial lesions are identified. Faint rounded foci of increased signal in the posterior right frontal lobe on the axial T1 postcontrast sequence (series 15, images 44 and 45) are not confirmed on the other planes and are considered to be artifactual. Vascular: Major intracranial vascular flow voids are preserved. Skull and upper cervical spine: No suspicious marrow lesion. Sinuses/Orbits: Unremarkable orbits. Minimal paranasal sinus mucosal thickening. Clear mastoid air cells. Other: None. IMPRESSION: Two new subcentimeter enhancing foci in the cerebellum. These are suspicious for metastases, although no edema or supratentorial lesions are present. Subacute infarcts are an additional consideration, however no supportive findings such as diffusion signal abnormality or chronic infarcts are present to favor ischemia over metastases. Short-term follow-up MRI could be considered in 4-6 weeks if clinically appropriate rather than immediately initiating treatment. Electronically Signed   By: Logan Bores M.D.   On: 05/02/2017 16:45   Ct Abdomen Pelvis W Contrast  Result Date: 05/17/2017 CLINICAL DATA:  Epigastric pain for several months. History of lung cancer. EXAM: CT ABDOMEN AND PELVIS WITH CONTRAST TECHNIQUE: Multidetector CT imaging of the abdomen and pelvis was performed using the standard protocol following bolus administration of intravenous contrast. CONTRAST:  133mL ISOVUE-300 IOPAMIDOL (ISOVUE-300) INJECTION 61% COMPARISON:  PET-CT 04/04/2016, CT chest 01/31/2017 in the pole of the FINDINGS: Lower chest: Subpleural nodule in the RIGHT lower lobe measures 10 mm (image number 1, series 3) which compares to 6 mm on CT 01/31/2017. Very small subpleural nodule in the LEFT lower lobe anteriorly measures on image 7, series 3 is not changed. Nodule over the hemidiaphragm measuring 5 mm (images 6, series 3) compares with 4 mm. Hepatobiliary:  Round 2.3 cm lesion in the central RIGHT hepatic lobe (segment 8, image 19, series 2) is new from comparison exams. Additional small hypodense lesion in subcapsular LEFT hepatic lobe (segment 4A) is also not seen on comparison exams. Pancreas: Pancreas is normal. No ductal dilatation. No pancreatic inflammation. Spleen: Normal spleen Adrenals/urinary tract: Adrenal glands and kidneys are normal. The ureters and bladder normal. Stomach/Bowel: Stomach, small bowel, appendix, and cecum are normal. Multiple diverticular of the descending colon sigmoid colon. Small fluid collection along the LEFT external iliac vessels measuring 3.4 cm has low simple fluid attenuation and not changed from prior. The RIGHT ovary has a similar pattern to this LEFT lesion in both lesions are felt represent ovaries. Vascular/Lymphatic: Abdominal aorta is normal caliber. There is no retroperitoneal or periportal lymphadenopathy. No pelvic lymphadenopathy. Reproductive: Post hysterectomy anatomy Other: No free fluid. Musculoskeletal: No aggressive osseous lesion IMPRESSION: 1. New central RIGHT hepatic lobe lesion most consistent with hepatic metastasis. 2. Second low-density lesion LEFT hepatic lobe concerning for metastatic lesion 3. Interval increase in size of pleural-based nodules in LEFT and RIGHT lower lobe is concerning for lung cancer recurrence. These results will be called to the ordering clinician or representative by the Radiologist Assistant, and communication documented in the PACS or zVision Dashboard. Electronically Signed   By: Suzy Bouchard M.D.   On: 05/17/2017 16:42      IMPRESSION/PLAN: 1. 51 y.o. woman with stage T2a N2 M0 adenocarcinoma of the left upper lung - Stage IIIA with new findings on recent brain MRI suspicious for early metastatic disease vs subacute infarct. The recommendation is for repeat MRI in 4-6 weeks. She did not tolerate her recent MRI well due to difficulty lying flat on her back.  She will  contact Mikey Bussing, NP to discuss changing pain medications to see if this will help and I have also provided a prescription for Ativan 0.5mg  to take 5 minutes prior to her scan to help alleviate associated anxiety. We will coordinate her scan with an upcoming brain [place] to review imaging and discuss treatment recommendations prior to seeing the patient back in the office for follow up.  She appears to have a good understanding of her disease and current recommendations. We will plan to see her back following her MRI scan in 4-6 weeks. She knows to call with any questions or concerns in the interim.    Nicholos Johns, PA-C    Tyler Pita, MD  [facility] Direct Dial: [phone]  Fax: [phone] [email or web address]  Skype  LinkedIn    Page Me

## 2017-05-18 NOTE — Telephone Encounter (Signed)
Gave avs and calendar for January 2019 °

## 2017-05-18 NOTE — Patient Instructions (Signed)
Carboplatin injection What is this medicine? CARBOPLATIN (KAR boe pla tin) is a chemotherapy drug. It targets fast dividing cells, like cancer cells, and causes these cells to die. This medicine is used to treat ovarian cancer and many other cancers. This medicine may be used for other purposes; ask your health care provider or pharmacist if you have questions. COMMON BRAND NAME(S): Paraplatin What should I tell my health care provider before I take this medicine? They need to know if you have any of these conditions: -blood disorders -hearing problems -kidney disease -recent or ongoing radiation therapy -an unusual or allergic reaction to carboplatin, cisplatin, other chemotherapy, other medicines, foods, dyes, or preservatives -pregnant or trying to get pregnant -breast-feeding How should I use this medicine? This drug is usually given as an infusion into a vein. It is administered in a hospital or clinic by a specially trained health care professional. Talk to your pediatrician regarding the use of this medicine in children. Special care may be needed. Overdosage: If you think you have taken too much of this medicine contact a poison control center or emergency room at once. NOTE: This medicine is only for you. Do not share this medicine with others. What if I miss a dose? It is important not to miss a dose. Call your doctor or health care professional if you are unable to keep an appointment. What may interact with this medicine? -medicines for seizures -medicines to increase blood counts like filgrastim, pegfilgrastim, sargramostim -some antibiotics like amikacin, gentamicin, neomycin, streptomycin, tobramycin -vaccines Talk to your doctor or health care professional before taking any of these medicines: -acetaminophen -aspirin -ibuprofen -ketoprofen -naproxen This list may not describe all possible interactions. Give your health care provider a list of all the medicines, herbs,  non-prescription drugs, or dietary supplements you use. Also tell them if you smoke, drink alcohol, or use illegal drugs. Some items may interact with your medicine. What should I watch for while using this medicine? Your condition will be monitored carefully while you are receiving this medicine. You will need important blood work done while you are taking this medicine. This drug may make you feel generally unwell. This is not uncommon, as chemotherapy can affect healthy cells as well as cancer cells. Report any side effects. Continue your course of treatment even though you feel ill unless your doctor tells you to stop. In some cases, you may be given additional medicines to help with side effects. Follow all directions for their use. Call your doctor or health care professional for advice if you get a fever, chills or sore throat, or other symptoms of a cold or flu. Do not treat yourself. This drug decreases your body's ability to fight infections. Try to avoid being around people who are sick. This medicine may increase your risk to bruise or bleed. Call your doctor or health care professional if you notice any unusual bleeding. Be careful brushing and flossing your teeth or using a toothpick because you may get an infection or bleed more easily. If you have any dental work done, tell your dentist you are receiving this medicine. Avoid taking products that contain aspirin, acetaminophen, ibuprofen, naproxen, or ketoprofen unless instructed by your doctor. These medicines may hide a fever. Do not become pregnant while taking this medicine. Women should inform their doctor if they wish to become pregnant or think they might be pregnant. There is a potential for serious side effects to an unborn child. Talk to your health care professional or  pharmacist for more information. Do not breast-feed an infant while taking this medicine. What side effects may I notice from receiving this medicine? Side effects  that you should report to your doctor or health care professional as soon as possible: -allergic reactions like skin rash, itching or hives, swelling of the face, lips, or tongue -signs of infection - fever or chills, cough, sore throat, pain or difficulty passing urine -signs of decreased platelets or bleeding - bruising, pinpoint red spots on the skin, black, tarry stools, nosebleeds -signs of decreased red blood cells - unusually weak or tired, fainting spells, lightheadedness -breathing problems -changes in hearing -changes in vision -chest pain -high blood pressure -low blood counts - This drug may decrease the number of white blood cells, red blood cells and platelets. You may be at increased risk for infections and bleeding. -nausea and vomiting -pain, swelling, redness or irritation at the injection site -pain, tingling, numbness in the hands or feet -problems with balance, talking, walking -trouble passing urine or change in the amount of urine Side effects that usually do not require medical attention (report to your doctor or health care professional if they continue or are bothersome): -hair loss -loss of appetite -metallic taste in the mouth or changes in taste This list may not describe all possible side effects. Call your doctor for medical advice about side effects. You may report side effects to FDA at 1-800-FDA-1088. Where should I keep my medicine? This drug is given in a hospital or clinic and will not be stored at home. NOTE: This sheet is a summary. It may not cover all possible information. If you have questions about this medicine, talk to your doctor, pharmacist, or health care provider.  2018 [employer] Standard (2007-08-21 14:38:05)  Pemetrexed injection What is this medicine? PEMETREXED (PEM e TREX ed) is a chemotherapy drug used to treat lung cancers like non-small cell lung cancer and mesothelioma. It may also be used to treat other cancers. This  medicine may be used for other purposes; ask your health care provider or pharmacist if you have questions. COMMON BRAND NAME(S): Alimta What should I tell my health care provider before I take this medicine? They need to know if you have any of these conditions: -infection (especially a virus infection such as chickenpox, cold sores, or herpes) -kidney disease -low blood counts, like low white cell, platelet, or red cell counts -lung or breathing disease, like asthma -radiation therapy -an unusual or allergic reaction to pemetrexed, other medicines, foods, dyes, or preservative -pregnant or trying to get pregnant -breast-feeding How should I use this medicine? This drug is given as an infusion into a vein. It is administered in a hospital or clinic by a specially trained health care professional. Talk to your pediatrician regarding the use of this medicine in children. Special care may be needed. Overdosage: If you think you have taken too much of this medicine contact a poison control center or emergency room at once. NOTE: This medicine is only for you. Do not share this medicine with others. What if I miss a dose? It is important not to miss your dose. Call your doctor or health care professional if you are unable to keep an appointment. What may interact with this medicine? This medicine may interact with the following medications: -Ibuprofen This list may not describe all possible interactions. Give your health care provider a list of all the medicines, herbs, non-prescription drugs, or dietary supplements you use. Also tell them if you  smoke, drink alcohol, or use illegal drugs. Some items may interact with your medicine. What should I watch for while using this medicine? Visit your doctor for checks on your progress. This drug may make you feel generally unwell. This is not uncommon, as chemotherapy can affect healthy cells as well as cancer cells. Report any side effects. Continue  your course of treatment even though you feel ill unless your doctor tells you to stop. In some cases, you may be given additional medicines to help with side effects. Follow all directions for their use. Call your doctor or health care professional for advice if you get a fever, chills or sore throat, or other symptoms of a cold or flu. Do not treat yourself. This drug decreases your body's ability to fight infections. Try to avoid being around people who are sick. This medicine may increase your risk to bruise or bleed. Call your doctor or health care professional if you notice any unusual bleeding. Be careful brushing and flossing your teeth or using a toothpick because you may get an infection or bleed more easily. If you have any dental work done, tell your dentist you are receiving this medicine. Avoid taking products that contain aspirin, acetaminophen, ibuprofen, naproxen, or ketoprofen unless instructed by your doctor. These medicines may hide a fever. Call your doctor or health care professional if you get diarrhea or mouth sores. Do not treat yourself. To protect your kidneys, drink water or other fluids as directed while you are taking this medicine. Do not become pregnant while taking this medicine or for 6 months after stopping it. Women should inform their doctor if they wish to become pregnant or think they might be pregnant. Men should not father a child while taking this medicine and for 3 months after stopping it. This may interfere with the ability to father a child. You should talk to your doctor or health care professional if you are concerned about your fertility. There is a potential for serious side effects to an unborn child. Talk to your health care professional or pharmacist for more information. Do not breast-feed an infant while taking this medicine or for 1 week after stopping it. What side effects may I notice from receiving this medicine? Side effects that you should report  to your doctor or health care professional as soon as possible: -allergic reactions like skin rash, itching or hives, swelling of the face, lips, or tongue -breathing problems -redness, blistering, peeling or loosening of the skin, including inside the mouth -signs and symptoms of bleeding such as bloody or black, tarry stools; red or dark-brown urine; spitting up blood or brown material that looks like coffee grounds; red spots on the skin; unusual bruising or bleeding from the eye, gums, or nose -signs and symptoms of infection like fever or chills; cough; sore throat; pain or trouble passing urine -signs and symptoms of kidney injury like trouble passing urine or change in the amount of urine -signs and symptoms of liver injury like dark yellow or brown urine; general ill feeling or flu-like symptoms; light-colored stools; loss of appetite; nausea; right upper belly pain; unusually weak or tired; yellowing of the eyes or skin Side effects that usually do not require medical attention (report to your doctor or health care professional if they continue or are bothersome): -constipation -dizziness -mouth sores -nausea, vomiting -pain, tingling, numbness in the hands or feet -unusually weak or tired This list may not describe all possible side effects. Call your doctor for  medical advice about side effects. You may report side effects to FDA at 1-800-FDA-1088. Where should I keep my medicine? This drug is given in a hospital or clinic and will not be stored at home. NOTE: This sheet is a summary. It may not cover all possible information. If you have questions about this medicine, talk to your doctor, pharmacist, or health care provider.  2018 [employer] Standard (2016-03-15 18:51:46)  Bevacizumab injection What is this medicine? BEVACIZUMAB (be va SIZ yoo mab) is a monoclonal antibody. It is used to treat many types of cancer. This medicine may be used for other purposes; ask your health  care provider or pharmacist if you have questions. COMMON BRAND NAME(S): Avastin What should I tell my health care provider before I take this medicine? They need to know if you have any of these conditions: -diabetes -heart disease -high blood pressure -history of coughing up blood -prior anthracycline chemotherapy (e.g., doxorubicin, daunorubicin, epirubicin) -recent or ongoing radiation therapy -recent or planning to have surgery -stroke -an unusual or allergic reaction to bevacizumab, hamster proteins, mouse proteins, other medicines, foods, dyes, or preservatives -pregnant or trying to get pregnant -breast-feeding How should I use this medicine? This medicine is for infusion into a vein. It is given by a health care professional in a hospital or clinic setting. Talk to your pediatrician regarding the use of this medicine in children. Special care may be needed. Overdosage: If you think you have taken too much of this medicine contact a poison control center or emergency room at once. NOTE: This medicine is only for you. Do not share this medicine with others. What if I miss a dose? It is important not to miss your dose. Call your doctor or health care professional if you are unable to keep an appointment. What may interact with this medicine? Interactions are not expected. This list may not describe all possible interactions. Give your health care provider a list of all the medicines, herbs, non-prescription drugs, or dietary supplements you use. Also tell them if you smoke, drink alcohol, or use illegal drugs. Some items may interact with your medicine. What should I watch for while using this medicine? Your condition will be monitored carefully while you are receiving this medicine. You will need important blood work and urine testing done while you are taking this medicine. This medicine may increase your risk to bruise or bleed. Call your doctor or health care professional if you  notice any unusual bleeding. This medicine should be started at least 28 days following major surgery and the site of the surgery should be totally healed. Check with your doctor before scheduling dental work or surgery while you are receiving this treatment. Talk to your doctor if you have recently had surgery or if you have a wound that has not healed. Do not become pregnant while taking this medicine or for 6 months after stopping it. Women should inform their doctor if they wish to become pregnant or think they might be pregnant. There is a potential for serious side effects to an unborn child. Talk to your health care professional or pharmacist for more information. Do not breast-feed an infant while taking this medicine and for 6 months after the last dose. This medicine has caused ovarian failure in some women. This medicine may interfere with the ability to have a child. You should talk to your doctor or health care professional if you are concerned about your fertility. What side effects may I notice from  receiving this medicine? Side effects that you should report to your doctor or health care professional as soon as possible: -allergic reactions like skin rash, itching or hives, swelling of the face, lips, or tongue -chest pain or chest tightness -chills -coughing up blood -high fever -seizures -severe constipation -signs and symptoms of bleeding such as bloody or black, tarry stools; red or dark-brown urine; spitting up blood or brown material that looks like coffee grounds; red spots on the skin; unusual bruising or bleeding from the eye, gums, or nose -signs and symptoms of a blood clot such as breathing problems; chest pain; severe, sudden headache; pain, swelling, warmth in the leg -signs and symptoms of a stroke like changes in vision; confusion; trouble speaking or understanding; severe headaches; sudden numbness or weakness of the face, arm or leg; trouble walking; dizziness; loss  of balance or coordination -stomach pain -sweating -swelling of legs or ankles -vomiting -weight gain Side effects that usually do not require medical attention (report to your doctor or health care professional if they continue or are bothersome): -back pain -changes in taste -decreased appetite -dry skin -nausea -tiredness This list may not describe all possible side effects. Call your doctor for medical advice about side effects. You may report side effects to FDA at 1-800-FDA-1088. Where should I keep my medicine? This drug is given in a hospital or clinic and will not be stored at home. NOTE: This sheet is a summary. It may not cover all possible information. If you have questions about this medicine, talk to your doctor, pharmacist, or health care provider.  2018 [employer] Standard (2016-05-13 14:33:29)

## 2017-05-18 NOTE — Progress Notes (Signed)
[facility] OFFICE PROGRESS NOTE  Hulan Fess, MD [place] [address]  DIAGNOSIS: Stage IIIA (T2a, N2, M0) non-small cell lung cancer, adenocarcinoma presented with left suprahilar mass, mediastinal lymphadenopathy and suspicious pulmonary nodule in the left upper lobe diagnosed in November 2017. No actionable mutations on Guardant 360 testing.  PRIOR THERAPY:  1) Status post concurrent chemoradiation with weekly carboplatin for AUC of 2 and paclitaxel 45 MG/M2 for 7 cycles. Last dose was given 06/13/2016. 2) Immunotherapy with Imfinzi (Durvalumab) 10 MG/KG every 2 weeks. First dose 08/11/2016. Status post 17 cycles.  CURRENT THERAPY: Under consideration to begin targeted therapy versus systemic chemotherapy with carboplatin for an AUC of 5, Alimta 500 mg/m and Avastin pending further workup.  INTERVAL HISTORY: MINNETTE MERIDA 51 y.o. female returns for routine follow-up visit by herself.  The patient reports that she is noticing some increased shortness of breath as well as more discomfort to her back.  She also continues to have right lower rib cage pain. She is using Percocet alternating with ibuprofen.  Pain is fairly well controlled at this time.  She denies fevers and chills.  Denies chest pain, cough, hemoptysis.  Denies nausea, vomiting, constipation, diarrhea.  Denies rashes.  The patient is here for evaluation prior to starting cycle #18 of treatment and to review her recent MRI of the brain and CT of the abdomen and pelvis.  Unfortunately, her insurance continues to deny her CT of the chest.  MEDICAL HISTORY: Past Medical History:  Diagnosis Date  . Allergic rhinitis   . Deaf, left   . Diverticulosis    sigmoid colon  . Dyspnea   . Encounter for antineoplastic chemotherapy 04/18/2016  . Encounter for antineoplastic immunotherapy 07/21/2016  . GERD (gastroesophageal reflux disease)   . Hyperlipidemia   . Lung cancer ([place]) 03/2016   non small  cell lung cancer favoring adenocarcinoma   . Lung nodules   . Migraine   . Odynophagia 05/24/2016  . Pneumonia   . PONV (postoperative nausea and vomiting)   . Tachycardia, paroxysmal ([facility]) 09/08/2016  . Wears glasses     ALLERGIES:  is allergic to penicillins; chantix [varenicline]; and levaquin [levofloxacin].  MEDICATIONS:  Current Outpatient Medications  Medication Sig Dispense Refill  . albuterol (PROVENTIL HFA;VENTOLIN HFA) 108 (90 Base) MCG/ACT inhaler Inhale 2 puffs into the lungs every 6 (six) hours as needed for wheezing or shortness of breath. (Patient not taking: Reported on 05/18/2017) 1 Inhaler 0  . benzonatate (TESSALON) 100 MG capsule Take 1 capsule (100 mg total) by mouth every 4 (four) hours as needed for cough. 60 capsule 1  . Calcium-Magnesium-Vitamin D 185-50-100 MG-MG-UNIT CAPS Take 1 tablet by mouth every evening.    . cetirizine (ZYRTEC) 10 MG tablet Take 10 mg by mouth daily.    . diphenhydrAMINE (BENADRYL) 25 mg capsule Take 25 mg by mouth at bedtime as needed.    . durvalumab (IMFINZI) 120 MG/2.4ML SOLN injection Inject 120 mg into the vein every 14 (fourteen) days.     Nyoka Cowden Tea, Camillia sinensis, (GREEN TEA EXTRACT PO) Take by mouth.    . Lactobacillus (PROBIOTIC ACIDOPHILUS) CAPS Take by mouth.    . Milk Thistle 1000 MG CAPS Take 1 capsule by mouth daily.    . Misc Natural Products (BLACK COHOSH MENOPAUSE COMPLEX) TABS Take 1 capsule by mouth 2 (two) times daily.    . Misc Natural Products (TURMERIC CURCUMIN) CAPS Take 1 capsule by mouth every  evening. 1500 mg capsule    . Multiple Vitamins-Minerals (WOMENS 50+ MULTI VITAMIN/MIN) TABS Take 1 tablet by mouth daily.    Marland Kitchen oxyCODONE-acetaminophen (PERCOCET/ROXICET) 5-325 MG tablet Take 1 tablet by mouth every 8 (eight) hours as needed (Chronic pain management). 30 tablet 0  . pantoprazole (PROTONIX) 40 MG tablet Take 1 tablet (40 mg total) 2 (two) times daily by mouth. 180 tablet 3  . pravastatin (PRAVACHOL)  20 MG tablet Take 20 mg by mouth every evening.  3  . prochlorperazine (COMPAZINE) 10 MG tablet Take 1 tablet (10 mg total) by mouth every 6 (six) hours as needed for nausea or vomiting. (Patient not taking: Reported on 05/18/2017) 30 tablet 0  . OVER THE COUNTER MEDICATION Take 250 mg by mouth daily. Jiaogulan Supplement     No current facility-administered medications for this visit.    Facility-Administered Medications Ordered in Other Visits  Medication Dose Route Frequency Provider Last Rate Last Dose  . iopamidol (ISOVUE-300) 61 % injection             SURGICAL HISTORY:  Past Surgical History:  Procedure Laterality Date  . ABDOMINAL HYSTERECTOMY    . VIDEO BRONCHOSCOPY Bilateral 07/03/2015   Procedure: VIDEO BRONCHOSCOPY WITH FLUORO;  Surgeon: Marshell Garfinkel, MD;  Location: [facility];  Service: Cardiopulmonary;  Laterality: Bilateral;  . VIDEO BRONCHOSCOPY WITH ENDOBRONCHIAL ULTRASOUND N/A 04/06/2016   Procedure: VIDEO BRONCHOSCOPY WITH ENDOBRONCHIAL ULTRASOUND;  Surgeon: Collene Gobble, MD;  Location: [facility];  Service: Thoracic;  Laterality: N/A;  . WRIST FRACTURE SURGERY      REVIEW OF SYSTEMS:   Review of Systems  Constitutional: Negative for appetite change, chills, fever and unexpected weight change. Positive for fatigue. HENT:   Negative for mouth sores, nosebleeds, sore throat and trouble swallowing.   Eyes: Negative for eye problems and icterus.  Respiratory: Negative for cough, hemoptysis, and wheezing.  Positive for increasing shortness of breath.  Cardiovascular: Negative for chest pain and leg swelling.  Gastrointestinal: Negative for abdominal pain, constipation, diarrhea, nausea and vomiting.  Genitourinary: Negative for bladder incontinence, difficulty urinating, dysuria, frequency and hematuria.   Musculoskeletal: Negative for back pain, gait problem, neck pain and neck stiffness.  Skin: Negative for itching and rash.  Neurological: Negative for dizziness,  extremity weakness, gait problem, headaches, light-headedness and seizures.  Hematological: Negative for adenopathy. Does not bruise/bleed easily.  Psychiatric/Behavioral: Negative for confusion, depression and sleep disturbance. The patient is not nervous/anxious.     PHYSICAL EXAMINATION:  Blood pressure 130/84, pulse (!) 101, temperature 98.4 F (36.9 C), temperature source Oral, resp. rate 18, height 5' 7.5" (1.715 m), weight 199 lb 8 oz (90.5 kg), SpO2 96 %.  ECOG PERFORMANCE STATUS: 1 - Symptomatic but completely ambulatory  Physical Exam  Constitutional: Oriented to person, place, and time and well-developed, well-nourished, and in no distress. No distress.  HENT:  Head: Normocephalic and atraumatic.  Mouth/Throat: Oropharynx is clear and moist. No oropharyngeal exudate.  Eyes: Conjunctivae are normal. Right eye exhibits no discharge. Left eye exhibits no discharge. No scleral icterus.  Neck: Normal range of motion. Neck supple.  Cardiovascular: Normal rate, regular rhythm, normal heart sounds and intact distal pulses.   Pulmonary/Chest: Effort normal and breath sounds normal. No respiratory distress. No wheezes. No rales.  Abdominal: Soft. Bowel sounds are normal. Exhibits no distension and no mass. There is no tenderness.  Musculoskeletal: Normal range of motion. Exhibits no edema.  Lymphadenopathy:    No cervical adenopathy.  Neurological: Alert and oriented  to person, place, and time. Exhibits normal muscle tone. Gait normal. Coordination normal.  Skin: Skin is warm and dry. No rash noted. Not diaphoretic. No erythema. No pallor.  Psychiatric: Mood, memory and judgment normal.  Vitals reviewed.  LABORATORY DATA: Lab Results  Component Value Date   WBC 5.8 05/18/2017   HGB 14.2 05/18/2017   HCT 42.1 05/18/2017   MCV 94.4 05/18/2017   PLT 283 05/18/2017      Chemistry      Component Value Date/Time   NA 139 05/18/2017 0838   K 4.4 05/18/2017 0838   CL 107  04/06/2016 0742   CO2 22 05/18/2017 0838   BUN 6.9 (L) 05/18/2017 0838   CREATININE 0.8 05/18/2017 0838      Component Value Date/Time   CALCIUM 9.8 05/18/2017 0838   ALKPHOS 99 05/18/2017 0838   AST 25 05/18/2017 0838   ALT 26 05/18/2017 0838   BILITOT 0.39 05/18/2017 0838       RADIOGRAPHIC STUDIES:  Ct Chest W Contrast  Result Date: 05/18/2017 CLINICAL DATA:  51 year old female complaining of shortness of breath for the past 1.5 months and sternal pain for the past 2 months. Intermittent hemoptysis. 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 EXAM: CT CHEST WITH CONTRAST TECHNIQUE: Multidetector CT imaging of the chest was performed during intravenous contrast administration. CONTRAST:  5mL ISOVUE-300 IOPAMIDOL (ISOVUE-300) INJECTION 61% COMPARISON:  75 mL of Isovue-300. FINDINGS: Cardiovascular: Heart size is normal. Increasing areas of pericardial thickening and nodularity. This is most evident just lateral to the left side of the pulmonic trunk (axial image 59 of series 2, and adjacent to the left ventricular apex (axial image 90 of series 2. There is a new nodular area of pericardial thickening on axial image 90 of series 2 measuring 12 x 8 mm. These findings suggest developing pericardial metastasis, presumably from direct invasion from the previously demonstrated left hilar mass. Atherosclerosis in the thoracic aorta. No coronary artery calcifications. Mediastinum/Nodes: Enlarged high right paratracheal lymph node measuring 17 mm in short axis with low-attenuation center. Prominent soft tissue in the left hilar region, without discrete enlarged lymph node. New prominent but nonenlarged lymph node measuring 8 mm in short axis adjacent to the low-attenuation lower esophagus (axial image 97 of series 2). Low left paratracheal lymph node measuring 11 mm in short  axis, similar to the prior study. Esophagus is unremarkable in appearance. No axillary lymphadenopathy. 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 However, the areas of nodularity in the inferior segment of the lingula appear increased compared to the prior examination. The largest of these is on axial image 79 of series 7, currently measuring 19 x 14 mm (previously 14 x 10 mm when measured in a similar fashion on exam 01/31/2017. Another nodule currently measures 16 x 10 mm (axial image 73 of series 7), previously 7 x 10 mm when measured in a similar fashion on the prior exam. New nodules are also noted in the inferior segment of the lingula, largest of which measures 7 mm on axial image 73 of series 7. Previously noted subpleural nodule in the posteromedial aspect of the right lower lobe currently measures 12 x 6 mm (previously 6 mm). Other new and enlarging nodules are also noted, including a new 5 mm  nodule in the right lower lobe (axial image 135 of series 7), a left lower lobe nodule (image 104 of series 7) which currently measures 5 x 6 mm (previously only 3 mm), and a 4 mm right lower lobe nodule (axial image 98 of series 7) which previously measured only 2 mm. Tiny calcified granuloma in the right upper lobe incidentally noted. No acute consolidative airspace disease. Trace left pleural effusion partially loculated superiorly and lying dependently inferiorly, similar to the prior examination. No right pleural effusion. Upper Abdomen: Several new hypovascular hepatic lesions are noted, highly concerning for new metastatic disease to the liver. The largest of these is in the central aspect of segment 8 (axial image 138 of series 2) measuring 1.9 x 2.7 cm. Subcentimeter low-attenuation lesions in both kidneys are too small to definitively characterize, but are  statistically likely to represent tiny cysts. Aortic atherosclerosis. Musculoskeletal: There are no aggressive appearing lytic or blastic lesions noted in the visualized portions of the skeleton. IMPRESSION: 1. 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 2. Additional incidental findings, as above. Aortic Atherosclerosis (ICD10-I70.0). Electronically Signed   By: Vinnie Langton M.D.   On: 05/18/2017 11:35   Mr Jeri Cos FO Contrast  Result Date: 05/02/2017 CLINICAL DATA:  Non-small cell lung cancer.  Staging. EXAM: MRI HEAD WITHOUT AND WITH CONTRAST TECHNIQUE: Multiplanar, multiecho pulse sequences of the brain and surrounding structures were obtained without and with intravenous contrast. CONTRAST:  18 mL MultiHance COMPARISON:  04/23/2016 FINDINGS: Brain: There is no evidence of acute infarct, intracranial hemorrhage, midline shift, or extra-axial fluid collection. The ventricles and sulci are within normal limits for age. Small developmental venous anomalies are again seen in both frontal lobes. There are 2 new small foci of enhancement in the cerebellum which measure 2 mm on the right (series 15, image 13) and 5 mm on the left (series 15, image 14). There is faint FLAIR hyperintensity associated with the left cerebellar focus, however there is no significant edema and there is no associated diffusion abnormality. No definite enhancing supratentorial lesions are identified. Faint rounded foci of increased signal in the posterior right frontal lobe on the axial T1 postcontrast sequence (series 15, images 44 and 45) are not confirmed on the other planes and are considered to be artifactual. Vascular: Major intracranial vascular flow voids are preserved. Skull and upper cervical spine: No suspicious  marrow lesion. Sinuses/Orbits: Unremarkable orbits. Minimal paranasal sinus mucosal thickening. Clear mastoid air cells. Other: None. IMPRESSION: Two new subcentimeter enhancing foci in the cerebellum. These are suspicious for metastases, although no edema or supratentorial lesions are present. Subacute infarcts are an additional consideration, however no supportive findings such as diffusion signal abnormality or chronic infarcts are present to favor ischemia over metastases. Short-term follow-up MRI could be considered in 4-6 weeks if clinically appropriate rather than immediately initiating treatment. Electronically Signed   By: Logan Bores M.D.   On: 05/02/2017 16:45   Ct Abdomen Pelvis W Contrast  Result Date: 05/17/2017 CLINICAL DATA:  Epigastric pain for several months. History of lung cancer. EXAM: CT ABDOMEN AND PELVIS WITH CONTRAST TECHNIQUE: Multidetector CT imaging of the abdomen and pelvis was performed using the standard protocol following bolus administration of intravenous contrast. CONTRAST:  129mL ISOVUE-300 IOPAMIDOL (ISOVUE-300) INJECTION 61% COMPARISON:  PET-CT 04/04/2016, CT chest 01/31/2017 in the pole  of the FINDINGS: Lower chest: Subpleural nodule in the RIGHT lower lobe measures 10 mm (image number 1, series 3) which compares to 6 mm on CT 01/31/2017. Very small subpleural nodule in the LEFT lower lobe anteriorly measures on image 7, series 3 is not changed. Nodule over the hemidiaphragm measuring 5 mm (images 6, series 3) compares with 4 mm. Hepatobiliary: Round 2.3 cm lesion in the central RIGHT hepatic lobe (segment 8, image 19, series 2) is new from comparison exams. Additional small hypodense lesion in subcapsular LEFT hepatic lobe (segment 4A) is also not seen on comparison exams. Pancreas: Pancreas is normal. No ductal dilatation. No pancreatic inflammation. Spleen: Normal spleen Adrenals/urinary tract: Adrenal glands and kidneys are normal. The ureters and bladder normal.  Stomach/Bowel: Stomach, small bowel, appendix, and cecum are normal. Multiple diverticular of the descending colon sigmoid colon. Small fluid collection along the LEFT external iliac vessels measuring 3.4 cm has low simple fluid attenuation and not changed from prior. The RIGHT ovary has a similar pattern to this LEFT lesion in both lesions are felt represent ovaries. Vascular/Lymphatic: Abdominal aorta is normal caliber. There is no retroperitoneal or periportal lymphadenopathy. No pelvic lymphadenopathy. Reproductive: Post hysterectomy anatomy Other: No free fluid. Musculoskeletal: No aggressive osseous lesion IMPRESSION: 1. New central RIGHT hepatic lobe lesion most consistent with hepatic metastasis. 2. Second low-density lesion LEFT hepatic lobe concerning for metastatic lesion 3. Interval increase in size of pleural-based nodules in LEFT and RIGHT lower lobe is concerning for lung cancer recurrence. These results will be called to the ordering clinician or representative by the Radiologist Assistant, and communication documented in the PACS or zVision Dashboard. Electronically Signed   By: Suzy Bouchard M.D.   On: 05/17/2017 16:42     ASSESSMENT/PLAN:  Adenocarcinoma of left lung, stage 3 (HCC) This is a very pleasant 51 year old white female with unresectable a stage IIIA non-small cell lung cancer, adenocarcinoma status post a course of concurrent chemoradiation with partial response. The patient is currently undergoing consolidation immunotherapy with Imfinzi (Durvalumab) status post 17 cycles. She tolerated the last cycle of her treatment well, her, she has increased fatigue and shortness of breath.  The patient was seen with Dr. Julien Nordmann.  CT scan results of the abdomen and pelvis as well as MRI of the brain were discussed with the patient that she has a new central right hepatic lobe lesion and a left hepatic lobe lesion likely due to hepatic metastasis.  Visualized lower lung fields show  interval increase in the size of the pleural-based nodules in the left and right lower lobes which are concerning for cancer recurrence.  MRI of the brain results showed 2 new subcentimeter lesions in the cerebellum which are suspicious for metastases.  Recommend that she discontinue her treatment with Imfinzi at this time.  The patient has an appointment with radiation oncology later today and they can discuss possible SRS to the brain lesions.  Unfortunately, the CT of the chest was still not approved by the patient's insurance company.  We do need this to complete her staging workup and the fact the patient is having increased shortness of breath.  A stat CT of the chest was ordered today.  We recommend proceeding with an ultrasound-guided liver biopsy to obtain tissue to send for molecular studies.  Several phone calls were made on behalf of the patient in interventional radiology will likely be able to perform his procedure tomorrow at [facility].  They will contact the patient directly.  Pending the results of this biopsy, may consider the patient for targeted therapy if a mutation is identified versus treatment with systemic chemotherapy consisting of carboplatin for an AUC of 5, Alimta 500 mg meter squared, and Avastin.  She was given information about these medications today. Further treatment plan pending the results of the biopsy.  The patient will have to go out of town due to the death of her mother and she has a second opinion at [facility] on January 4.  We will plan to bring the patient back after she returns from out of state and after her second opinion to discuss treatment options.  For pain management, she will continue on Percocet.  The patient was advised to call immediately if she has any concerning symptoms in the interval. The patient voices understanding of current disease status and treatment options and is in agreement with the current care plan. All  questions were answered. The patient knows to call the clinic with any problems, questions or concerns. We can certainly see the patient much sooner if necessary.  Orders Placed This Encounter  Procedures  . CT CHEST W CONTRAST    Spoke w/beth tracy    Standing Status:   Future    Number of Occurrences:   1    Standing Expiration Date:   05/18/2018    Order Specific Question:   If indicated for the ordered procedure, I authorize the administration of contrast media per Radiology protocol    Answer:   Yes    Order Specific Question:   Preferred imaging location?    Answer:   [facility]    Order Specific Question:   Call Results- Best Contact Number?    Answer:   [phone] let patient leave    Order Specific Question:   Radiology Contrast Protocol - do NOT remove file path    Answer:   [email or web address]    Order Specific Question:   Reason for Exam additional comments    Answer:   Shortness of breath. Lung cancer. Pt with new brain mets and liver mets.    Order Specific Question:   Is patient pregnant?    Answer:   No  . US BIOPSY (LIVER)    Standing Status:   Future    Standing Expiration Date:   07/18/2018    Scheduling Instructions:     Send for molecular studies.    Order Specific Question:   Reason for exam:    Answer:   U/S guided core biopsy of R hepatic lobe liver lesion.    Order Specific Question:   Preferred imaging location?    Answer:   [place] [place], AGPCNP-BC, [place] 05/18/17  ADDENDUM: Hematology/Oncology Attending: I had a face-to-face encounter with the patient.  I recommended her care plan.  This is a very pleasant 51 years old white female with history of a stage IIIa non-small cell lung cancer status post a course of concurrent chemoradiation with partial response followed by consolidation treatment with immunotherapy with Imfinzi (Durvalumab) status post 18 cycles. She has been  tolerating her treatment with Imfinzi (Durvalumab) fairly well.  The patient had recent abdominal pain and right lower rib cage pain.  She has a history of hiatal hernia and she was seen by gastroenterology for evaluation.  CT scan of the abdomen and pelvis performed recently showed new central right hepatic lobe lesion consistent with hepatic metastasis.  There was also  a second low-density lesion in the left hepatic lobe concerning for metastatic lesion as well as increase in pleural-based nodules in the left and right lower lobe concerning for disease progression.  We order CT scan of the chest today that showed further disease progression in the chest. The patient also had recent MRI of the brain that showed 2 new subcentimeter enhancing foci in the cerebellum suspicious for disease metastasis but no edema or supratentorial lesions were present.  She was referred to radiation oncology and expected to start a stereotactic radiotherapy to this lesion soon. Her previous CT scan of the chest in September 2018 showed a stable disease except for mild increase in a small nodular soft tissue focus in the left anterior mediastinum near the pericardium.  But there was no other concerning findings for disease progression or metastasis especially in the liver. I had a lengthy discussion with the patient today about her current disease status and treatment options.  I recommended for the patient to discontinue her current treatment with Imfinzi (Durvalumab). I discussed with the patient several options for management of her condition including ultrasound-guided core biopsy of 1 of the liver lesions for confirmation of the tissue diagnosis and also to have enough tissue for molecular studies.  Her previous molecular studies by Guardant 360 showed no actionable mutations. Because of the rapidly progressive nature of her disease, I strongly recommend for the patient to consider proceeding with systemic chemotherapy as soon  as possible.  She also understands that palliative care is an option. The patient would like to proceed with the systemic chemotherapy.  I recommended for her a regimen consisting of carboplatin for AC of 5, Alimta 500 mg/M2 and Avastin 15 mg/KG every 3 weeks.  She is expecting to start the first cycle of this treatment early next week.  I discussed with her the adverse effect of this treatment including but not limited to alopecia, myelosuppression, nausea and vomiting, peripheral neuropathy, liver or renal dysfunction as well as bleeding risk from the Avastin. She also has an appointment for a second opinion at [facility] in early January. The patient has to travel to [place] to attend funeral for her mother.  We will see her back for follow-up visit the first week of January. The patient was advised to call immediately if she has any concerning symptoms in the interval.  Disclaimer: This note was dictated with voice recognition software. Similar sounding words can inadvertently be transcribed and may be missed upon review. Eilleen Kempf, MD 05/20/17

## 2017-05-18 NOTE — Progress Notes (Signed)
[place] Psychosocial Distress Screening Clinical Social Work  Clinical Social Work was referred by distress screening protocol.  The patient scored a 9 on the Psychosocial Distress Thermometer which indicates severe distress. Clinical Social Worker Edwyna Shell to assess for distress and other psychosocial needs. Unable to reach, left VM outlining Support Center resources and services, left contact information and encouraged patient to call back.    ONCBCN DISTRESS SCREENING 05/18/2017  Screening Type Change in Status  Distress experienced in past week (1-10) 9  Practical problem type Work/school  Emotional problem type Depression;Adjusting to illness  Physical Problem type Pain  Physician notified of physical symptoms Yes    Clinical Social Worker follow up needed: Yes.    If yes, follow up plan:  CSW will attempt recontact.

## 2017-05-19 ENCOUNTER — Ambulatory Visit (HOSPITAL_COMMUNITY)
Admission: RE | Admit: 2017-05-19 | Discharge: 2017-05-19 | Disposition: A | Discharge status: Home / Self Care | Payer: No Typology Code available for payment source | Source: Ambulatory Visit | Attending: Internal Medicine | Admitting: Internal Medicine

## 2017-05-19 ENCOUNTER — Other Ambulatory Visit: Payer: Self-pay | Admitting: Internal Medicine

## 2017-05-19 ENCOUNTER — Other Ambulatory Visit: Payer: Self-pay | Admitting: Oncology

## 2017-05-19 ENCOUNTER — Telehealth: Payer: Self-pay | Admitting: Internal Medicine

## 2017-05-19 ENCOUNTER — Ambulatory Visit (HOSPITAL_COMMUNITY)
Admission: RE | Admit: 2017-05-19 | Discharge: 2017-05-19 | Disposition: A | Discharge status: Home / Self Care | Payer: No Typology Code available for payment source | Source: Ambulatory Visit | Attending: Oncology | Admitting: Oncology

## 2017-05-19 ENCOUNTER — Telehealth: Payer: Self-pay | Admitting: Oncology

## 2017-05-19 ENCOUNTER — Encounter: Payer: Self-pay | Admitting: General Practice

## 2017-05-19 ENCOUNTER — Encounter (HOSPITAL_COMMUNITY): Payer: Self-pay

## 2017-05-19 DIAGNOSIS — Z90710 Acquired absence of both cervix and uterus: Secondary | ICD-10-CM | POA: Diagnosis not present

## 2017-05-19 DIAGNOSIS — Z888 Allergy status to other drugs, medicaments and biological substances status: Secondary | ICD-10-CM | POA: Insufficient documentation

## 2017-05-19 DIAGNOSIS — Z803 Family history of malignant neoplasm of breast: Secondary | ICD-10-CM | POA: Diagnosis not present

## 2017-05-19 DIAGNOSIS — Z923 Personal history of irradiation: Secondary | ICD-10-CM | POA: Insufficient documentation

## 2017-05-19 DIAGNOSIS — Z9221 Personal history of antineoplastic chemotherapy: Secondary | ICD-10-CM | POA: Diagnosis not present

## 2017-05-19 DIAGNOSIS — Z881 Allergy status to other antibiotic agents status: Secondary | ICD-10-CM | POA: Insufficient documentation

## 2017-05-19 DIAGNOSIS — Z85118 Personal history of other malignant neoplasm of bronchus and lung: Secondary | ICD-10-CM | POA: Insufficient documentation

## 2017-05-19 DIAGNOSIS — Z880 Allergy status to penicillin: Secondary | ICD-10-CM | POA: Diagnosis not present

## 2017-05-19 DIAGNOSIS — E785 Hyperlipidemia, unspecified: Secondary | ICD-10-CM | POA: Insufficient documentation

## 2017-05-19 DIAGNOSIS — Z87891 Personal history of nicotine dependence: Secondary | ICD-10-CM | POA: Insufficient documentation

## 2017-05-19 DIAGNOSIS — C787 Secondary malignant neoplasm of liver and intrahepatic bile duct: Secondary | ICD-10-CM | POA: Diagnosis not present

## 2017-05-19 DIAGNOSIS — Z800 Family history of malignant neoplasm of digestive organs: Secondary | ICD-10-CM | POA: Insufficient documentation

## 2017-05-19 DIAGNOSIS — Z79899 Other long term (current) drug therapy: Secondary | ICD-10-CM | POA: Diagnosis not present

## 2017-05-19 DIAGNOSIS — C3492 Malignant neoplasm of unspecified part of left bronchus or lung: Secondary | ICD-10-CM

## 2017-05-19 DIAGNOSIS — H9192 Unspecified hearing loss, left ear: Secondary | ICD-10-CM | POA: Diagnosis not present

## 2017-05-19 DIAGNOSIS — K219 Gastro-esophageal reflux disease without esophagitis: Secondary | ICD-10-CM | POA: Diagnosis not present

## 2017-05-19 DIAGNOSIS — R918 Other nonspecific abnormal finding of lung field: Secondary | ICD-10-CM | POA: Insufficient documentation

## 2017-05-19 DIAGNOSIS — K769 Liver disease, unspecified: Secondary | ICD-10-CM | POA: Diagnosis present

## 2017-05-19 LAB — COMPREHENSIVE METABOLIC PANEL
ALBUMIN: 4.5 g/dL (ref 3.5–5.0)
ALK PHOS: 87 U/L (ref 38–126)
ALT: 29 U/L (ref 14–54)
ANION GAP: 13 (ref 5–15)
AST: 28 U/L (ref 15–41)
BILIRUBIN TOTAL: 0.8 mg/dL (ref 0.3–1.2)
BUN: 11 mg/dL (ref 6–20)
CALCIUM: 9.7 mg/dL (ref 8.9–10.3)
CO2: 24 mmol/L (ref 22–32)
Chloride: 100 mmol/L — ABNORMAL LOW (ref 101–111)
Creatinine, Ser: 0.75 mg/dL (ref 0.44–1.00)
GFR calc non Af Amer: >60 mL/min (ref >60)
GLUCOSE: 92 mg/dL (ref 65–99)
POTASSIUM: 4.1 mmol/L (ref 3.5–5.1)
SODIUM: 137 mmol/L (ref 135–145)
TOTAL PROTEIN: 7.7 g/dL (ref 6.5–8.1)

## 2017-05-19 LAB — CBC WITH DIFFERENTIAL/PLATELET
BASOS ABS: 0 10*3/uL (ref 0.0–0.1)
Basophils Relative: 1 %
EOS PCT: 3 %
Eosinophils Absolute: 0.2 10*3/uL (ref 0.0–0.7)
HEMATOCRIT: 39.8 % (ref 36.0–46.0)
HEMOGLOBIN: 13.6 g/dL (ref 12.0–15.0)
LYMPHS PCT: 31 %
Lymphs Abs: 1.7 10*3/uL (ref 0.7–4.0)
MCH: 32 pg (ref 26.0–34.0)
MCHC: 34.2 g/dL (ref 30.0–36.0)
MCV: 93.6 fL (ref 78.0–100.0)
Monocytes Absolute: 0.6 10*3/uL (ref 0.1–1.0)
Monocytes Relative: 10 %
NEUTROS ABS: 3.2 10*3/uL (ref 1.7–7.7)
NEUTROS PCT: 55 %
Platelets: 286 10*3/uL (ref 150–400)
RBC: 4.25 MIL/uL (ref 3.87–5.11)
RDW: 12.3 % (ref 11.5–15.5)
WBC: 5.6 10*3/uL (ref 4.0–10.5)

## 2017-05-19 LAB — PROTIME-INR
INR: 0.95
Prothrombin Time: 12.5 seconds (ref 11.4–15.2)

## 2017-05-19 MED ORDER — FENTANYL CITRATE (PF) 100 MCG/2ML IJ SOLN
INTRAMUSCULAR | Status: AC
  Filled 2017-05-19: qty 4

## 2017-05-19 MED ORDER — OXYCODONE-ACETAMINOPHEN 5-325 MG PO TABS
1.0000 | ORAL_TABLET | Freq: Three times a day (TID) | ORAL | Status: DC | PRN
  Administered 2017-05-19: 1 via ORAL
  Filled 2017-05-19: qty 1

## 2017-05-19 MED ORDER — MIDAZOLAM HCL 2 MG/2ML IJ SOLN
INTRAMUSCULAR | Status: AC
  Filled 2017-05-19: qty 4

## 2017-05-19 MED ORDER — FENTANYL CITRATE (PF) 100 MCG/2ML IJ SOLN
INTRAMUSCULAR | Status: AC | PRN
  Administered 2017-05-19: 25 ug via INTRAVENOUS
  Administered 2017-05-19: 50 ug via INTRAVENOUS
  Administered 2017-05-19 (×2): 25 ug via INTRAVENOUS
  Administered 2017-05-19: 50 ug via INTRAVENOUS

## 2017-05-19 MED ORDER — PROCHLORPERAZINE MALEATE 10 MG PO TABS: 10.0000 mg | ORAL_TABLET | Freq: Four times a day (QID) | ORAL | 0 refills | Status: DC | PRN

## 2017-05-19 MED ORDER — SODIUM CHLORIDE 0.9 % IV SOLN
INTRAVENOUS | Status: DC
  Administered 2017-05-19: 12:00:00 via INTRAVENOUS

## 2017-05-19 MED ORDER — LIDOCAINE HCL 2 % IJ SOLN
INTRAMUSCULAR | Status: AC
  Filled 2017-05-19: qty 10

## 2017-05-19 MED ORDER — DEXAMETHASONE 4 MG PO TABS: ORAL_TABLET | ORAL | 1 refills | Status: DC

## 2017-05-19 MED ORDER — MIDAZOLAM HCL 2 MG/2ML IJ SOLN
INTRAMUSCULAR | Status: AC | PRN
  Administered 2017-05-19 (×4): 1 mg via INTRAVENOUS

## 2017-05-19 MED ORDER — FOLIC ACID 1 MG PO TABS: 1.0000 mg | ORAL_TABLET | Freq: Every day | ORAL | 2 refills | Status: DC

## 2017-05-19 MED ORDER — FENTANYL CITRATE (PF) 100 MCG/2ML IJ SOLN
25.0000 ug | Freq: Once | INTRAMUSCULAR | Status: AC
  Administered 2017-05-19: 25 ug via INTRAVENOUS
  Filled 2017-05-19: qty 2

## 2017-05-19 MED ORDER — MORPHINE SULFATE 15 MG PO TABS: 15.0000 mg | ORAL_TABLET | ORAL | 0 refills | Status: DC | PRN

## 2017-05-19 NOTE — Progress Notes (Signed)
DISCONTINUE OFF PATHWAY REGIMEN - Non-Small Cell Lung   [id number]:Durvalumab 10 mg/kg q14 Days:   A cycle is every 14 days:     Durvalumab        Dose Mod: None  **Always confirm dose/schedule in your pharmacy ordering system**    REASON: Disease Progression PRIOR TREATMENT: Off Pathway: Durvalumab 10 mg/kg q14 Days TREATMENT RESPONSE: Progressive Disease (PD)  START ON PATHWAY REGIMEN - Non-Small Cell Lung     A cycle is every 21 days:     Carboplatin      Pemetrexed      Bevacizumab   **Always confirm dose/schedule in your pharmacy ordering system**    Patient Characteristics: Stage IV Metastatic, Nonsquamous, Initial Chemotherapy/Immunotherapy, PS = 0, 1, PD-L1 Expression Positive 1-49% (TPS) / Negative / Not Tested / Awaiting Test Results AJCC T Category: T2b Current Disease Status: Distant Metastases AJCC N Category: N2 AJCC M Category: M1c AJCC 8 Stage Grouping: IVB Histology: Nonsquamous Cell ROS1 Rearrangement Status: Negative T790M Mutation Status: Not Applicable - EGFR Mutation Negative/Unknown Other Mutations/Biomarkers: No Other Actionable Mutations PD-L1 Expression Status: Quantity Not Sufficient Chemotherapy/Immunotherapy LOT: Initial Chemotherapy/Immunotherapy Molecular Targeted Therapy: Not Appropriate ALK Translocation Status: Negative Would you be surprised if this patient died  in the next year<= I would NOT be surprised if this patient died in the next year EGFR Mutation Status: Negative/Wild Type BRAF [id number] Mutation Status: Negative Performance Status: PS = 0, 1 Intent of Therapy: Non-Curative / Palliative Intent, Discussed with Patient

## 2017-05-19 NOTE — Telephone Encounter (Signed)
Scheduled appt per 12/21 sch msg - left voicemail for patient regarding appts.

## 2017-05-19 NOTE — Progress Notes (Signed)
[place] Psychosocial Distress Screening Clinical Social Work  Clinical Social Work was referred by distress screening protocol.  The patient scored a 9 on the Psychosocial Distress Thermometer which indicates severe distress. Clinical Social Worker Edwyna Shell to assess for distress and other psychosocial needs. CSW reached patient as she was registering for biopsy procedure, did not want to talk at this time.  Requested mailed packet of information re Christmas and services, CSW encouraged patient to read over and contact CSW as needed.    ONCBCN DISTRESS SCREENING 05/18/2017  Screening Type Change in Status  Distress experienced in past week (1-10) 9  Practical problem type Work/school  Emotional problem type Depression;Adjusting to illness  Physical Problem type Pain  Physician notified of physical symptoms Yes    Clinical Social Worker follow up needed: Yes.    If yes, follow up plan:  Will attempt recontact next week after patient has chance to review Support Center services.

## 2017-05-19 NOTE — Discharge Instructions (Signed)
Moderate Conscious Sedation, Adult, Care After These instructions provide you with information about caring for yourself after your procedure. Your health care provider may also give you more specific instructions. Your treatment has been planned according to current medical practices, but problems sometimes occur. Call your health care provider if you have any problems or questions after your procedure. What can I expect after the procedure? After your procedure, it is common:  To feel sleepy for several hours.  To feel clumsy and have poor balance for several hours.  To have poor judgment for several hours.  To vomit if you eat too soon.  Follow these instructions at home: For at least 24 hours after the procedure:   Do not: ? Participate in activities where you could fall or become injured. ? Drive. ? Use heavy machinery. ? Drink alcohol. ? Take sleeping pills or medicines that cause drowsiness. ? Make important decisions or sign legal documents. ? Take care of children on your own.  Rest. Eating and drinking  Follow the diet recommended by your health care provider.  If you vomit: ? Drink water, juice, or soup when you can drink without vomiting. ? Make sure you have little or no nausea before eating solid foods. General instructions  Have a responsible adult stay with you until you are awake and alert.  Take over-the-counter and prescription medicines only as told by your health care provider.  If you smoke, do not smoke without supervision.  Keep all follow-up visits as told by your health care provider. This is important. Contact a health care provider if:  You keep feeling nauseous or you keep vomiting.  You feel light-headed.  You develop a rash.  You have a fever. Get help right away if:  You have trouble breathing. This information is not intended to replace advice given to you by your health care provider. Make sure you discuss any questions you have  with your health care provider. Document Released: 03/06/2013 Document Revised: 10/19/2015 Document Reviewed: 09/05/2015 Elsevier Interactive Patient Education  2018 [employer].   Liver Biopsy, Care After These instructions give you information on caring for yourself after your procedure. Your doctor may also give you more specific instructions. Call your doctor if you have any problems or questions after your procedure. Follow these instructions at home:  Rest at home for 1-2 days or as told by your doctor.  Have someone stay with you for at least 24 hours.  Do not do these things in the first 24 hours: ? Drive. ? Use machinery. ? Take care of other people. ? Sign legal documents. ? Take a bath or shower.  You may shower tomorrow.  There are many different ways to close and cover a cut (incision). For example, a cut can be closed with stitches, skin glue, or adhesive strips. Follow your doctor's instructions on: ? Taking care of your cut. ? Changing and removing your bandage (dressing).  You may remove your dressing tomorrow. ? Removing whatever was used to close your cut.  Do not drink alcohol in the first week.  Do not lift more than 5 pounds or play contact sports for the first 2 weeks.  Get your test results. Contact a doctor if:  A cut bleeds and leaves more than just a small spot of blood.  A cut is red, puffs up (swells), or hurts more than before.  Fluid or something else comes from a cut.  A cut smells bad.  You have a  fever or chills. Get help right away if:  You have swelling, bloating, or pain in your belly (abdomen).  You get dizzy or faint.  You have a rash.  You feel sick to your stomach (nauseous) or throw up (vomit).  You have trouble breathing, feel short of breath, or feel faint.  Your chest hurts.  You have problems talking or seeing.  You have trouble balancing or moving your arms or legs. This information is not intended to replace  advice given to you by your health care provider. Make sure you discuss any questions you have with your health care provider. Document Released: 02/23/2008 Document Revised: 10/22/2015 Document Reviewed: 07/12/2013 Elsevier Interactive Patient Education  [employer].

## 2017-05-19 NOTE — Consult Note (Signed)
Chief Complaint: Patient was seen in consultation today for right hepatic lobe lesion biopsy  Referring Physician(s): Reading Mayme Genta  Supervising Physician: Aletta Edouard  Patient Status: [facility] - Out-pt  History of Present Illness: Debbie Baker is a 51 y.o. female, former smoker,  with history of lung adenocarcinoma, status post chemoradiation and immunotherapy.  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  She presents today for image guided liver lesion biopsy for further evaluation/molecular studies.  Past Medical History:  Diagnosis Date  . Allergic rhinitis   . Deaf, left   . Diverticulosis    sigmoid colon  . Dyspnea   . Encounter for antineoplastic chemotherapy 04/18/2016  . Encounter for antineoplastic immunotherapy 07/21/2016  . GERD (gastroesophageal reflux disease)   . Hyperlipidemia   . Lung cancer ([place]) 03/2016   non small cell lung cancer favoring adenocarcinoma   . Lung nodules   . Migraine   . Odynophagia 05/24/2016  . Pneumonia   . PONV (postoperative nausea and vomiting)   . Tachycardia, paroxysmal ([place]) 09/08/2016  . Wears glasses     Past Surgical History:  Procedure Laterality Date  . ABDOMINAL HYSTERECTOMY    . VIDEO BRONCHOSCOPY Bilateral 07/03/2015   Procedure: VIDEO BRONCHOSCOPY WITH FLUORO;  Surgeon: Marshell Garfinkel, MD;  Location: [place];  Service: Cardiopulmonary;  Laterality: Bilateral;  . VIDEO BRONCHOSCOPY WITH ENDOBRONCHIAL ULTRASOUND N/A 04/06/2016   Procedure: VIDEO BRONCHOSCOPY WITH ENDOBRONCHIAL ULTRASOUND;  Surgeon: Collene Gobble, MD;  Location: [place];  Service: Thoracic;  Laterality: N/A;  . WRIST FRACTURE SURGERY      Allergies: Penicillins; Chantix [varenicline]; and Levaquin [levofloxacin]  Medications: Prior to Admission medications   Medication Sig Start Date End Date  Taking? Authorizing Provider  albuterol (PROVENTIL HFA;VENTOLIN HFA) 108 (90 Base) MCG/ACT inhaler Inhale 2 puffs into the lungs every 6 (six) hours as needed for wheezing or shortness of breath. Patient not taking: Reported on 05/18/2017 09/16/16   Ladell Pier, MD  benzonatate (TESSALON) 100 MG capsule Take 1 capsule (100 mg total) by mouth every 4 (four) hours as needed for cough. 09/08/16   Curt Bears, MD  Calcium-Magnesium-Vitamin D 185-50-100 MG-MG-UNIT CAPS Take 1 tablet by mouth every evening.    [provider]  cetirizine (ZYRTEC) 10 MG tablet Take 10 mg by mouth daily.    [provider]  dexamethasone (DECADRON) 4 MG tablet Take 1 tablet twice a day the day before, day of, and day after each cycle of chemotherapy. 05/19/17   Maryanna Shape, NP  diphenhydrAMINE (BENADRYL) 25 mg capsule Take 25 mg by mouth at bedtime as needed.    [provider]  durvalumab (IMFINZI) 120 MG/2.4ML SOLN injection Inject 120 mg into the vein every 14 (fourteen) days.     [provider]  folic acid (FOLVITE) 1 MG tablet Take 1 tablet (1 mg total) by mouth daily. 05/19/17   Maryanna Shape, NP  Green Tea, Camillia sinensis, (GREEN TEA EXTRACT PO) Take by mouth.    [provider]  Lactobacillus (PROBIOTIC ACIDOPHILUS) CAPS Take by mouth.    [provider]  LORazepam (ATIVAN) 0.5 MG tablet Take 1 tablet (0.5 mg total) by mouth as needed for anxiety (take 30 min prior to MRI scans and treatments). 05/18/17   Bruning, Ashlyn, PA-C  Milk Thistle 1000 MG CAPS Take 1 capsule by mouth daily.  [provider]  Misc Natural Products (BLACK COHOSH MENOPAUSE COMPLEX) TABS Take 1 capsule by mouth 2 (two) times daily.    [provider]  Misc Natural Products (TURMERIC CURCUMIN) CAPS Take 1 capsule by mouth every evening. 1500 mg capsule    [provider]  morphine (MSIR) 15 MG tablet Take 1 tablet (15 mg total) by mouth every  4 (four) hours as needed for severe pain. 05/19/17   Maryanna Shape, NP  Multiple Vitamins-Minerals (WOMENS 50+ MULTI VITAMIN/MIN) TABS Take 1 tablet by mouth daily.    [provider]  OVER THE COUNTER MEDICATION Take 250 mg by mouth daily. [place] Supplement    [provider]  oxyCODONE-acetaminophen (PERCOCET/ROXICET) 5-325 MG tablet Take 1 tablet by mouth every 8 (eight) hours as needed (Chronic pain management). 05/15/17   Curt Bears, MD  pantoprazole (PROTONIX) 40 MG tablet Take 1 tablet (40 mg total) 2 (two) times daily by mouth. 04/04/17   Armbruster, Carlota Raspberry, MD  pravastatin (PRAVACHOL) 20 MG tablet Take 20 mg by mouth every evening. 08/07/15   [provider]  prochlorperazine (COMPAZINE) 10 MG tablet Take 1 tablet (10 mg total) by mouth every 6 (six) hours as needed for nausea or vomiting. Patient not taking: Reported on 05/18/2017 04/18/16   Curt Bears, MD  prochlorperazine (COMPAZINE) 10 MG tablet Take 1 tablet (10 mg total) by mouth every 6 (six) hours as needed for nausea or vomiting. 05/19/17   Maryanna Shape, NP     Family History  Problem Relation Age of Onset  . Esophageal cancer Father   . Breast cancer Mother     Social History   Socioeconomic History  . Marital status: Single    Spouse name: Not on file  . Number of children: Not on file  . Years of education: Not on file  . Highest education level: Not on file  Social Needs  . Financial resource strain: Not on file  . Food insecurity - worry: Not on file  . Food insecurity - inability: Not on file  . Transportation needs - medical: Not on file  . Transportation needs - non-medical: Not on file  Occupational History  . Not on file  Tobacco Use  . Smoking status: Former Smoker    Packs/day: 1.50    Years: 30.00    Pack years: 45.00    Types: Cigarettes    Last attempt to quit: 08/02/2010    Years since quitting: 6.8  . Smokeless tobacco: Never Used  Substance  and Sexual Activity  . Alcohol use: Yes    Alcohol/week: 0.0 oz    Comment: 1-2 drinks daily  . Drug use: Yes    Types: Marijuana    Comment: last use 02/2016  . Sexual activity: Yes    Birth control/protection: Surgical  Other Topics Concern  . Not on file  Social History Narrative   10 cats   Lives with boyfriend   Not employed   Former employee of [employer]   3 children living in [place] denies fever, chest pain, nausea, vomiting or bleeding.  She does have occasional headaches, epigastric/upper back discomfort, occasional dyspnea with exertion and occasional cough.  Vital Signs: BP 107/76 (BP Location: Left Arm)   Pulse 85   Temp 97.9 F (36.6 C) (Oral)   Resp 18   SpO2 98%   Physical Exam awake, alert.  Chest with distant breath sounds  bilaterally.  Heart with regular rate and rhythm.  Abdomen soft, positive bowel sounds, tender epigastric region to palpation; extremities with full range of motion  Imaging: Ct Chest W Contrast  Result Date: 05/18/2017 CLINICAL DATA:  51 year old female complaining of shortness of breath for the past 1.5 months and sternal pain for the past 2 months. Intermittent hemoptysis. 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 EXAM: CT CHEST WITH CONTRAST TECHNIQUE: Multidetector CT imaging of the chest was performed during intravenous contrast administration. CONTRAST:  65mL ISOVUE-300 IOPAMIDOL (ISOVUE-300) INJECTION 61% COMPARISON:  75 mL of Isovue-300. FINDINGS: Cardiovascular: Heart size is normal. Increasing areas of pericardial thickening and nodularity. This is most evident just lateral to the left side of the pulmonic trunk (axial image 59 of series 2, and adjacent to the left ventricular apex (axial image 90 of series 2. There is a new nodular area of pericardial thickening on  axial image 90 of series 2 measuring 12 x 8 mm. These findings suggest developing pericardial metastasis, presumably from direct invasion from the previously demonstrated left hilar mass. Atherosclerosis in the thoracic aorta. No coronary artery calcifications. Mediastinum/Nodes: Enlarged high right paratracheal lymph node measuring 17 mm in short axis with low-attenuation center. Prominent soft tissue in the left hilar region, without discrete enlarged lymph node. New prominent but nonenlarged lymph node measuring 8 mm in short axis adjacent to the low-attenuation lower esophagus (axial image 97 of series 2). Low left paratracheal lymph node measuring 11 mm in short axis, similar to the prior study. Esophagus is unremarkable in appearance. No axillary lymphadenopathy. 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 However, the areas of nodularity in the inferior segment of the lingula appear increased compared to the prior examination. The largest of these is on axial image 79 of series 7, currently measuring 19 x 14 mm (previously 14 x 10 mm when measured in a similar fashion on exam 01/31/2017. Another nodule currently measures 16 x 10 mm (axial image 73 of series 7), previously 7 x 10 mm when measured in a similar fashion on the prior exam. New nodules are also noted in the inferior segment of the lingula, largest of which measures 7 mm on axial image 73 of series 7. Previously noted subpleural nodule in the posteromedial aspect of the right lower lobe currently measures 12 x 6 mm (previously 6 mm). Other new and enlarging nodules are also noted, including a new 5 mm nodule in the right lower lobe (axial image 135 of series 7), a left lower lobe nodule (image 104 of series 7) which currently measures 5 x 6 mm (previously only 3 mm), and a 4 mm right lower lobe  nodule (axial image 98 of series 7) which previously measured only 2 mm. Tiny calcified granuloma in the right upper lobe incidentally noted. No acute consolidative airspace disease. Trace left pleural effusion partially loculated superiorly and lying dependently inferiorly, similar to the prior examination. No right pleural effusion. Upper Abdomen: Several new hypovascular hepatic lesions are noted, highly concerning for new metastatic disease to the liver. The largest of these is in the central aspect of segment 8 (axial image 138 of series 2) measuring 1.9 x 2.7 cm. Subcentimeter low-attenuation lesions in both kidneys are too small to definitively characterize, but are statistically  likely to represent tiny cysts. Aortic atherosclerosis. Musculoskeletal: There are no aggressive appearing lytic or blastic lesions noted in the visualized portions of the skeleton. IMPRESSION: 1. 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 2. Additional incidental findings, as above. Aortic Atherosclerosis (ICD10-I70.0). Electronically Signed   By: Vinnie Langton M.D.   On: 05/18/2017 11:35   Mr Jeri Cos HY Contrast  Result Date: 05/02/2017 CLINICAL DATA:  Non-small cell lung cancer.  Staging. EXAM: MRI HEAD WITHOUT AND WITH CONTRAST TECHNIQUE: Multiplanar, multiecho pulse sequences of the brain and surrounding structures were obtained without and with intravenous contrast. CONTRAST:  18 mL MultiHance COMPARISON:  04/23/2016 FINDINGS: Brain: There is no evidence of acute infarct, intracranial hemorrhage, midline shift, or extra-axial fluid collection. The ventricles and sulci are within normal limits for age. Small developmental venous anomalies are again seen in both frontal lobes. There are 2 new small foci of  enhancement in the cerebellum which measure 2 mm on the right (series 15, image 13) and 5 mm on the left (series 15, image 14). There is faint FLAIR hyperintensity associated with the left cerebellar focus, however there is no significant edema and there is no associated diffusion abnormality. No definite enhancing supratentorial lesions are identified. Faint rounded foci of increased signal in the posterior right frontal lobe on the axial T1 postcontrast sequence (series 15, images 44 and 45) are not confirmed on the other planes and are considered to be artifactual. Vascular: Major intracranial vascular flow voids are preserved. Skull and upper cervical spine: No suspicious marrow lesion. Sinuses/Orbits: Unremarkable orbits. Minimal paranasal sinus mucosal thickening. Clear mastoid air cells. Other: None. IMPRESSION: Two new subcentimeter enhancing foci in the cerebellum. These are suspicious for metastases, although no edema or supratentorial lesions are present. Subacute infarcts are an additional consideration, however no supportive findings such as diffusion signal abnormality or chronic infarcts are present to favor ischemia over metastases. Short-term follow-up MRI could be considered in 4-6 weeks if clinically appropriate rather than immediately initiating treatment. Electronically Signed   By: Logan Bores M.D.   On: 05/02/2017 16:45   Ct Abdomen Pelvis W Contrast  Result Date: 05/17/2017 CLINICAL DATA:  Epigastric pain for several months. History of lung cancer. EXAM: CT ABDOMEN AND PELVIS WITH CONTRAST TECHNIQUE: Multidetector CT imaging of the abdomen and pelvis was performed using the standard protocol following bolus administration of intravenous contrast. CONTRAST:  154mL ISOVUE-300 IOPAMIDOL (ISOVUE-300) INJECTION 61% COMPARISON:  PET-CT 04/04/2016, CT chest 01/31/2017 in the pole of the FINDINGS: Lower chest: Subpleural nodule in the RIGHT lower lobe measures 10 mm (image number 1, series 3)  which compares to 6 mm on CT 01/31/2017. Very small subpleural nodule in the LEFT lower lobe anteriorly measures on image 7, series 3 is not changed. Nodule over the hemidiaphragm measuring 5 mm (images 6, series 3) compares with 4 mm. Hepatobiliary: Round 2.3 cm lesion in the central RIGHT hepatic lobe (segment 8, image 19, series 2) is new from comparison exams. Additional small hypodense lesion in subcapsular LEFT hepatic lobe (segment 4A) is also not seen on comparison exams. Pancreas: Pancreas is normal. No ductal dilatation. No pancreatic inflammation. Spleen: Normal spleen Adrenals/urinary tract: Adrenal glands and kidneys are normal. The ureters and bladder normal. Stomach/Bowel: Stomach, small bowel, appendix, and cecum are normal. Multiple diverticular of the descending colon sigmoid  colon. Small fluid collection along the LEFT external iliac vessels measuring 3.4 cm has low simple fluid attenuation and not changed from prior. The RIGHT ovary has a similar pattern to this LEFT lesion in both lesions are felt represent ovaries. Vascular/Lymphatic: Abdominal aorta is normal caliber. There is no retroperitoneal or periportal lymphadenopathy. No pelvic lymphadenopathy. Reproductive: Post hysterectomy anatomy Other: No free fluid. Musculoskeletal: No aggressive osseous lesion IMPRESSION: 1. New central RIGHT hepatic lobe lesion most consistent with hepatic metastasis. 2. Second low-density lesion LEFT hepatic lobe concerning for metastatic lesion 3. Interval increase in size of pleural-based nodules in LEFT and RIGHT lower lobe is concerning for lung cancer recurrence. These results will be called to the ordering clinician or representative by the Radiologist Assistant, and communication documented in the PACS or zVision Dashboard. Electronically Signed   By: Suzy Bouchard M.D.   On: 05/17/2017 16:42    Labs:  CBC: Recent Labs    03/30/17 0856 04/13/17 0906 04/27/17 0911 05/18/17 0838  WBC 5.9  4.9 5.6 5.8  HGB 14.3 13.5 14.6 14.2  HCT 42.8 40.9 43.8 42.1  PLT 221 213 256 283    COAGS: No results for input(s): INR, APTT in the last 8760 hours.  BMP: Recent Labs    03/30/17 0856 04/13/17 0906 04/27/17 0911 05/18/17 0838  NA 140 136 138 139  K 4.4 3.9 4.1 4.4  CO2 25 22 24 22   GLUCOSE 91 104 94 96  BUN 9.9 11.9 12.6 6.9*  CALCIUM 9.4 9.0 9.9 9.8  CREATININE 0.7 0.8 0.8 0.8    LIVER FUNCTION TESTS: Recent Labs    03/30/17 0856 04/13/17 0906 04/27/17 0911 05/18/17 0838  BILITOT 0.29 0.48 0.53 0.39  AST 26 20 23 25   ALT 31 22 26 26   ALKPHOS 115 99 110 99  PROT 7.0 6.9 7.8 7.6  ALBUMIN 4.0 3.9 4.4 4.2    TUMOR MARKERS: No results for input(s): AFPTM, CEA, CA199, CHROMGRNA in the last 8760 hours.  Assessment and Plan: 51 y.o. female with history of lung adenocarcinoma, status post chemoradiation and immunotherapy.  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  She presents today for image guided liver lesion biopsy for further evaluation/molecular studies.Risks and benefits discussed with the patient/sig other including, but not limited to bleeding, infection, damage to adjacent structures or low yield requiring additional tests.All of the patient's questions were answered, patient is agreeable to proceed. Consent signed and in chart. Labs pend.     Thank you for this interesting consult.  I greatly enjoyed meeting Debbie Baker and look forward to participating in their care.  A copy of this report was sent to the requesting provider on this date.  Electronically Signed: D. Rowe Robert, PA-C 05/19/2017, 11:25 AM   I spent a total of 25 minutes  in face to face in clinical consultation, greater than 50% of which was counseling/coordinating care for image guided right hepatic lobe lesion biopsy

## 2017-05-19 NOTE — Telephone Encounter (Signed)
Call placed to the patient to discuss her CT scan results and recommendations from Dr. Julien Nordmann.  We discussed that her CT of the chest showed progressive disease.  Dr. Julien Nordmann recommends not waiting until after she returns from her mother's funeral to begin chemotherapy.  We talked about starting treatment with carboplatin, Alimta, and Avastin as outlined yesterday on 05/22/2017.  She will receive a vitamin B12 injection on the day of her treatment.  A prescription for folic acid 1 mg daily, dexamethasone 4 mg twice daily the day before, day of, and day after her chemotherapy, and a refill of Compazine 10 mg every 6 hours as needed for nausea and vomiting was sent to her pharmacy.  Discussed with the patient adverse effects of this treatment including but not limited to alopecia, myelosuppression, nausea and vomiting, peripheral neuropathy, liver or renal dysfunction in addition to the adverse effect of Avastin including but not limited to pulmonary hemorrhage, GI perforation, wound healing delay as well as hypertension and proteinuria.  The patient is in agreement to proceeding.  Message sent to scheduling to get the patient scheduled back on 05/22/2017 for lab and chemotherapy.  She will keep her routine visit on 06/07/2016.  The patient will have weekly labs while on chemotherapy, but may skip the time while she is out of town for her mother's funeral.  The patient also explained that she is having headaches with her current pain medication and wants to try something different.  A prescription for morphine 15 mg tablets to take 1 every 4 hours as needed for pain #40 was printed for her and she will pick up later today.

## 2017-05-22 ENCOUNTER — Ambulatory Visit (HOSPITAL_BASED_OUTPATIENT_CLINIC_OR_DEPARTMENT_OTHER): Payer: 59 | Admitting: Medical

## 2017-05-22 ENCOUNTER — Other Ambulatory Visit (HOSPITAL_BASED_OUTPATIENT_CLINIC_OR_DEPARTMENT_OTHER): Payer: 59

## 2017-05-22 ENCOUNTER — Other Ambulatory Visit: Payer: Self-pay | Admitting: Medical

## 2017-05-22 ENCOUNTER — Ambulatory Visit (HOSPITAL_BASED_OUTPATIENT_CLINIC_OR_DEPARTMENT_OTHER): Payer: 59

## 2017-05-22 VITALS — BP 121/77 | HR 97 | Temp 97.9°F | Resp 17

## 2017-05-22 DIAGNOSIS — C7931 Secondary malignant neoplasm of brain: Secondary | ICD-10-CM

## 2017-05-22 DIAGNOSIS — J069 Acute upper respiratory infection, unspecified: Secondary | ICD-10-CM

## 2017-05-22 DIAGNOSIS — C3412 Malignant neoplasm of upper lobe, left bronchus or lung: Secondary | ICD-10-CM | POA: Diagnosis not present

## 2017-05-22 DIAGNOSIS — C787 Secondary malignant neoplasm of liver and intrahepatic bile duct: Secondary | ICD-10-CM

## 2017-05-22 DIAGNOSIS — C3492 Malignant neoplasm of unspecified part of left bronchus or lung: Secondary | ICD-10-CM

## 2017-05-22 DIAGNOSIS — Z5112 Encounter for antineoplastic immunotherapy: Secondary | ICD-10-CM | POA: Diagnosis not present

## 2017-05-22 DIAGNOSIS — Z79899 Other long term (current) drug therapy: Secondary | ICD-10-CM | POA: Diagnosis not present

## 2017-05-22 DIAGNOSIS — Z5111 Encounter for antineoplastic chemotherapy: Secondary | ICD-10-CM | POA: Diagnosis not present

## 2017-05-22 LAB — COMPREHENSIVE METABOLIC PANEL
ALBUMIN: 4.3 g/dL (ref 3.5–5.0)
ALK PHOS: 113 U/L (ref 40–150)
ALT: 27 U/L (ref 0–55)
ANION GAP: 13 meq/L — AB (ref 3–11)
AST: 17 U/L (ref 5–34)
BILIRUBIN TOTAL: 0.31 mg/dL (ref 0.20–1.20)
BUN: 10.3 mg/dL (ref 7.0–26.0)
CALCIUM: 9.6 mg/dL (ref 8.4–10.4)
CHLORIDE: 106 meq/L (ref 98–109)
CO2: 21 mEq/L — ABNORMAL LOW (ref 22–29)
CREATININE: 0.8 mg/dL (ref 0.6–1.1)
EGFR: >60 mL/min/{1.73_m2} (ref >60)
Glucose: 116 mg/dl (ref 70–140)
Potassium: 4.2 mEq/L (ref 3.5–5.1)
Sodium: 140 mEq/L (ref 136–145)
TOTAL PROTEIN: 7.8 g/dL (ref 6.4–8.3)

## 2017-05-22 LAB — CBC WITH DIFFERENTIAL/PLATELET
BASO%: 0.4 % (ref 0.0–2.0)
Basophils Absolute: 0.1 10*3/uL (ref 0.0–0.1)
EOS%: 0.1 % (ref 0.0–7.0)
Eosinophils Absolute: 0 10*3/uL (ref 0.0–0.5)
HCT: 39 % (ref 34.8–46.6)
HGB: 13.1 g/dL (ref 11.6–15.9)
LYMPH%: 6.7 % — AB (ref 14.0–49.7)
MCH: 31.4 pg (ref 25.1–34.0)
MCHC: 33.6 g/dL (ref 31.5–36.0)
MCV: 93.3 fL (ref 79.5–101.0)
MONO#: 0.6 10*3/uL (ref 0.1–0.9)
MONO%: 4.7 % (ref 0.0–14.0)
NEUT%: 88.1 % — AB (ref 38.4–76.8)
NEUTROS ABS: 12 10*3/uL — AB (ref 1.5–6.5)
PLATELETS: 313 10*3/uL (ref 145–400)
RBC: 4.18 10*6/uL (ref 3.70–5.45)
RDW: 12.4 % (ref 11.2–14.5)
WBC: 13.6 10*3/uL — AB (ref 3.9–10.3)
lymph#: 0.9 10*3/uL (ref 0.9–3.3)

## 2017-05-22 LAB — UA PROTEIN, DIPSTICK - CHCC: PROTEIN: NEGATIVE mg/dL

## 2017-05-22 MED ORDER — BEVACIZUMAB CHEMO INJECTION 400 MG/16ML
15.5000 mg/kg | Freq: Once | INTRAVENOUS | Status: AC
  Administered 2017-05-22: 1400 mg via INTRAVENOUS
  Filled 2017-05-22: qty 48

## 2017-05-22 MED ORDER — CYANOCOBALAMIN 1000 MCG/ML IJ SOLN
INTRAMUSCULAR | Status: AC
  Filled 2017-05-22: qty 1

## 2017-05-22 MED ORDER — PREDNISONE 10 MG PO TABS: ORAL_TABLET | ORAL | 0 refills | Status: DC

## 2017-05-22 MED ORDER — SODIUM CHLORIDE 0.9 % IV SOLN
706.0000 mg | Freq: Once | INTRAVENOUS | Status: AC
  Administered 2017-05-22: 710 mg via INTRAVENOUS
  Filled 2017-05-22: qty 71

## 2017-05-22 MED ORDER — SODIUM CHLORIDE 0.9 % IV SOLN
Freq: Once | INTRAVENOUS | Status: AC
  Administered 2017-05-22: 14:00:00 via INTRAVENOUS
  Filled 2017-05-22: qty 5

## 2017-05-22 MED ORDER — SODIUM CHLORIDE 0.9 % IV SOLN
Freq: Once | INTRAVENOUS | Status: AC
  Administered 2017-05-22: 13:00:00 via INTRAVENOUS

## 2017-05-22 MED ORDER — SODIUM CHLORIDE 0.9 % IV SOLN
1000.0000 mg | Freq: Once | INTRAVENOUS | Status: AC
  Administered 2017-05-22: 1000 mg via INTRAVENOUS
  Filled 2017-05-22: qty 40

## 2017-05-22 MED ORDER — CYANOCOBALAMIN 1000 MCG/ML IJ SOLN
1000.0000 ug | Freq: Once | INTRAMUSCULAR | Status: AC
  Administered 2017-05-22: 1000 ug via INTRAMUSCULAR

## 2017-05-22 MED ORDER — AZITHROMYCIN 250 MG PO TABS: ORAL_TABLET | ORAL | 0 refills | Status: DC

## 2017-05-22 MED ORDER — PALONOSETRON HCL INJECTION 0.25 MG/5ML
INTRAVENOUS | Status: AC
  Filled 2017-05-22: qty 5

## 2017-05-22 MED ORDER — PALONOSETRON HCL INJECTION 0.25 MG/5ML
0.2500 mg | Freq: Once | INTRAVENOUS | Status: AC
  Administered 2017-05-22: 0.25 mg via INTRAVENOUS

## 2017-05-22 NOTE — Patient Instructions (Signed)
[facility] Discharge Instructions for Patients Receiving Chemotherapy  Today you received the following chemotherapy agents:  Avastin (bevasizumab), Alimta (pemetrexed), Carboplatin (paraplatin)  To help prevent nausea and vomiting after your treatment, we encourage you to take your nausea medication as prescribed.   If you develop nausea and vomiting that is not controlled by your nausea medication, call the clinic.   BELOW ARE SYMPTOMS THAT SHOULD BE REPORTED IMMEDIATELY:  *FEVER GREATER THAN 100.5 F  *CHILLS WITH OR WITHOUT FEVER  NAUSEA AND VOMITING THAT IS NOT CONTROLLED WITH YOUR NAUSEA MEDICATION  *UNUSUAL SHORTNESS OF BREATH  *UNUSUAL BRUISING OR BLEEDING  TENDERNESS IN MOUTH AND THROAT WITH OR WITHOUT PRESENCE OF ULCERS  *URINARY PROBLEMS  *BOWEL PROBLEMS  UNUSUAL RASH Items with * indicate a potential emergency and should be followed up as soon as possible.  Feel free to call the clinic should you have any questions or concerns. The clinic phone number is [phone].  Please show the [facility] at check-in to the Emergency Department and triage nurse.    Carboplatin injection What is this medicine? CARBOPLATIN (KAR boe pla tin) is a chemotherapy drug. It targets fast dividing cells, like cancer cells, and causes these cells to die. This medicine is used to treat ovarian cancer and many other cancers. This medicine may be used for other purposes; ask your health care provider or pharmacist if you have questions. COMMON BRAND NAME(S): Paraplatin What should I tell my health care provider before I take this medicine? They need to know if you have any of these conditions: -blood disorders -hearing problems -kidney disease -recent or ongoing radiation therapy -an unusual or allergic reaction to carboplatin, cisplatin, other chemotherapy, other medicines, foods, dyes, or preservatives -pregnant or trying to get  pregnant -breast-feeding How should I use this medicine? This drug is usually given as an infusion into a vein. It is administered in a hospital or clinic by a specially trained health care professional. Talk to your pediatrician regarding the use of this medicine in children. Special care may be needed. Overdosage: If you think you have taken too much of this medicine contact a poison control center or emergency room at once. NOTE: This medicine is only for you. Do not share this medicine with others. What if I miss a dose? It is important not to miss a dose. Call your doctor or health care professional if you are unable to keep an appointment. What may interact with this medicine? -medicines for seizures -medicines to increase blood counts like filgrastim, pegfilgrastim, sargramostim -some antibiotics like amikacin, gentamicin, neomycin, streptomycin, tobramycin -vaccines Talk to your doctor or health care professional before taking any of these medicines: -acetaminophen -aspirin -ibuprofen -ketoprofen -naproxen This list may not describe all possible interactions. Give your health care provider a list of all the medicines, herbs, non-prescription drugs, or dietary supplements you use. Also tell them if you smoke, drink alcohol, or use illegal drugs. Some items may interact with your medicine. What should I watch for while using this medicine? Your condition will be monitored carefully while you are receiving this medicine. You will need important blood work done while you are taking this medicine. This drug may make you feel generally unwell. This is not uncommon, as chemotherapy can affect healthy cells as well as cancer cells. Report any side effects. Continue your course of treatment even though you feel ill unless your doctor tells you to stop. In some cases, you may be given additional  medicines to help with side effects. Follow all directions for their use. Call your doctor or  health care professional for advice if you get a fever, chills or sore throat, or other symptoms of a cold or flu. Do not treat yourself. This drug decreases your body's ability to fight infections. Try to avoid being around people who are sick. This medicine may increase your risk to bruise or bleed. Call your doctor or health care professional if you notice any unusual bleeding. Be careful brushing and flossing your teeth or using a toothpick because you may get an infection or bleed more easily. If you have any dental work done, tell your dentist you are receiving this medicine. Avoid taking products that contain aspirin, acetaminophen, ibuprofen, naproxen, or ketoprofen unless instructed by your doctor. These medicines may hide a fever. Do not become pregnant while taking this medicine. Women should inform their doctor if they wish to become pregnant or think they might be pregnant. There is a potential for serious side effects to an unborn child. Talk to your health care professional or pharmacist for more information. Do not breast-feed an infant while taking this medicine. What side effects may I notice from receiving this medicine? Side effects that you should report to your doctor or health care professional as soon as possible: -allergic reactions like skin rash, itching or hives, swelling of the face, lips, or tongue -signs of infection - fever or chills, cough, sore throat, pain or difficulty passing urine -signs of decreased platelets or bleeding - bruising, pinpoint red spots on the skin, black, tarry stools, nosebleeds -signs of decreased red blood cells - unusually weak or tired, fainting spells, lightheadedness -breathing problems -changes in hearing -changes in vision -chest pain -high blood pressure -low blood counts - This drug may decrease the number of white blood cells, red blood cells and platelets. You may be at increased risk for infections and bleeding. -nausea and  vomiting -pain, swelling, redness or irritation at the injection site -pain, tingling, numbness in the hands or feet -problems with balance, talking, walking -trouble passing urine or change in the amount of urine Side effects that usually do not require medical attention (report to your doctor or health care professional if they continue or are bothersome): -hair loss -loss of appetite -metallic taste in the mouth or changes in taste This list may not describe all possible side effects. Call your doctor for medical advice about side effects. You may report side effects to FDA at 1-800-FDA-1088. Where should I keep my medicine? This drug is given in a hospital or clinic and will not be stored at home. NOTE: This sheet is a summary. It may not cover all possible information. If you have questions about this medicine, talk to your doctor, pharmacist, or health care provider.  2018 Else[employer]/Gold Standard (2007-08-21 14:38:05)   Bevacizumab injection What is this medicine? BEVACIZUMAB (be va SIZ yoo mab) is a monoclonal antibody. It is used to treat many types of cancer. This medicine may be used for other purposes; ask your health care provider or pharmacist if you have questions. COMMON BRAND NAME(S): Avastin What should I tell my health care provider before I take this medicine? They need to know if you have any of these conditions: -diabetes -heart disease -high blood pressure -history of coughing up blood -prior anthracycline chemotherapy (e.g., doxorubicin, daunorubicin, epirubicin) -recent or ongoing radiation therapy -recent or planning to have surgery -stroke -an unusual or allergic reaction to bevacizumab, hamster proteins,  mouse proteins, other medicines, foods, dyes, or preservatives -pregnant or trying to get pregnant -breast-feeding How should I use this medicine? This medicine is for infusion into a vein. It is given by a health care professional in a hospital or clinic  setting. Talk to your pediatrician regarding the use of this medicine in children. Special care may be needed. Overdosage: If you think you have taken too much of this medicine contact a poison control center or emergency room at once. NOTE: This medicine is only for you. Do not share this medicine with others. What if I miss a dose? It is important not to miss your dose. Call your doctor or health care professional if you are unable to keep an appointment. What may interact with this medicine? Interactions are not expected. This list may not describe all possible interactions. Give your health care provider a list of all the medicines, herbs, non-prescription drugs, or dietary supplements you use. Also tell them if you smoke, drink alcohol, or use illegal drugs. Some items may interact with your medicine. What should I watch for while using this medicine? Your condition will be monitored carefully while you are receiving this medicine. You will need important blood work and urine testing done while you are taking this medicine. This medicine may increase your risk to bruise or bleed. Call your doctor or health care professional if you notice any unusual bleeding. This medicine should be started at least 28 days following major surgery and the site of the surgery should be totally healed. Check with your doctor before scheduling dental work or surgery while you are receiving this treatment. Talk to your doctor if you have recently had surgery or if you have a wound that has not healed. Do not become pregnant while taking this medicine or for 6 months after stopping it. Women should inform their doctor if they wish to become pregnant or think they might be pregnant. There is a potential for serious side effects to an unborn child. Talk to your health care professional or pharmacist for more information. Do not breast-feed an infant while taking this medicine and for 6 months after the last dose. This  medicine has caused ovarian failure in some women. This medicine may interfere with the ability to have a child. You should talk to your doctor or health care professional if you are concerned about your fertility. What side effects may I notice from receiving this medicine? Side effects that you should report to your doctor or health care professional as soon as possible: -allergic reactions like skin rash, itching or hives, swelling of the face, lips, or tongue -chest pain or chest tightness -chills -coughing up blood -high fever -seizures -severe constipation -signs and symptoms of bleeding such as bloody or black, tarry stools; red or dark-brown urine; spitting up blood or brown material that looks like coffee grounds; red spots on the skin; unusual bruising or bleeding from the eye, gums, or nose -signs and symptoms of a blood clot such as breathing problems; chest pain; severe, sudden headache; pain, swelling, warmth in the leg -signs and symptoms of a stroke like changes in vision; confusion; trouble speaking or understanding; severe headaches; sudden numbness or weakness of the face, arm or leg; trouble walking; dizziness; loss of balance or coordination -stomach pain -sweating -swelling of legs or ankles -vomiting -weight gain Side effects that usually do not require medical attention (report to your doctor or health care professional if they continue or are bothersome): -back  pain -changes in taste -decreased appetite -dry skin -nausea -tiredness This list may not describe all possible side effects. Call your doctor for medical advice about side effects. You may report side effects to FDA at 1-800-FDA-1088. Where should I keep my medicine? This drug is given in a hospital or clinic and will not be stored at home. NOTE: This sheet is a summary. It may not cover all possible information. If you have questions about this medicine, talk to your doctor, pharmacist, or health care  provider.  2018 Elsevier/Gold Standard (2016-05-13 14:33:29)   Pemetrexed injection What is this medicine? PEMETREXED (PEM e TREX ed) is a chemotherapy drug used to treat lung cancers like non-small cell lung cancer and mesothelioma. It may also be used to treat other cancers. This medicine may be used for other purposes; ask your health care provider or pharmacist if you have questions. COMMON BRAND NAME(S): Alimta What should I tell my health care provider before I take this medicine? They need to know if you have any of these conditions: -infection (especially a virus infection such as chickenpox, cold sores, or herpes) -kidney disease -low blood counts, like low white cell, platelet, or red cell counts -lung or breathing disease, like asthma -radiation therapy -an unusual or allergic reaction to pemetrexed, other medicines, foods, dyes, or preservative -pregnant or trying to get pregnant -breast-feeding How should I use this medicine? This drug is given as an infusion into a vein. It is administered in a hospital or clinic by a specially trained health care professional. Talk to your pediatrician regarding the use of this medicine in children. Special care may be needed. Overdosage: If you think you have taken too much of this medicine contact a poison control center or emergency room at once. NOTE: This medicine is only for you. Do not share this medicine with others. What if I miss a dose? It is important not to miss your dose. Call your doctor or health care professional if you are unable to keep an appointment. What may interact with this medicine? This medicine may interact with the following medications: -Ibuprofen This list may not describe all possible interactions. Give your health care provider a list of all the medicines, herbs, non-prescription drugs, or dietary supplements you use. Also tell them if you smoke, drink alcohol, or use illegal drugs. Some items may interact  with your medicine. What should I watch for while using this medicine? Visit your doctor for checks on your progress. This drug may make you feel generally unwell. This is not uncommon, as chemotherapy can affect healthy cells as well as cancer cells. Report any side effects. Continue your course of treatment even though you feel ill unless your doctor tells you to stop. In some cases, you may be given additional medicines to help with side effects. Follow all directions for their use. Call your doctor or health care professional for advice if you get a fever, chills or sore throat, or other symptoms of a cold or flu. Do not treat yourself. This drug decreases your body's ability to fight infections. Try to avoid being around people who are sick. This medicine may increase your risk to bruise or bleed. Call your doctor or health care professional if you notice any unusual bleeding. Be careful brushing and flossing your teeth or using a toothpick because you may get an infection or bleed more easily. If you have any dental work done, tell your dentist you are receiving this medicine. Avoid taking products  that contain aspirin, acetaminophen, ibuprofen, naproxen, or ketoprofen unless instructed by your doctor. These medicines may hide a fever. Call your doctor or health care professional if you get diarrhea or mouth sores. Do not treat yourself. To protect your kidneys, drink water or other fluids as directed while you are taking this medicine. Do not become pregnant while taking this medicine or for 6 months after stopping it. Women should inform their doctor if they wish to become pregnant or think they might be pregnant. Men should not father a child while taking this medicine and for 3 months after stopping it. This may interfere with the ability to father a child. You should talk to your doctor or health care professional if you are concerned about your fertility. There is a potential for serious side  effects to an unborn child. Talk to your health care professional or pharmacist for more information. Do not breast-feed an infant while taking this medicine or for 1 week after stopping it. What side effects may I notice from receiving this medicine? Side effects that you should report to your doctor or health care professional as soon as possible: -allergic reactions like skin rash, itching or hives, swelling of the face, lips, or tongue -breathing problems -redness, blistering, peeling or loosening of the skin, including inside the mouth -signs and symptoms of bleeding such as bloody or black, tarry stools; red or dark-brown urine; spitting up blood or brown material that looks like coffee grounds; red spots on the skin; unusual bruising or bleeding from the eye, gums, or nose -signs and symptoms of infection like fever or chills; cough; sore throat; pain or trouble passing urine -signs and symptoms of kidney injury like trouble passing urine or change in the amount of urine -signs and symptoms of liver injury like dark yellow or brown urine; general ill feeling or flu-like symptoms; light-colored stools; loss of appetite; nausea; right upper belly pain; unusually weak or tired; yellowing of the eyes or skin Side effects that usually do not require medical attention (report to your doctor or health care professional if they continue or are bothersome): -constipation -dizziness -mouth sores -nausea, vomiting -pain, tingling, numbness in the hands or feet -unusually weak or tired This list may not describe all possible side effects. Call your doctor for medical advice about side effects. You may report side effects to FDA at 1-800-FDA-1088. Where should I keep my medicine? This drug is given in a hospital or clinic and will not be stored at home. NOTE: This sheet is a summary. It may not cover all possible information. If you have questions about this medicine, talk to your doctor, pharmacist,  or health care provider.  2018 [employer]/Gold Standard (2016-03-15 18:51:46)

## 2017-05-24 ENCOUNTER — Other Ambulatory Visit: Payer: Self-pay | Admitting: Medical Oncology

## 2017-05-24 ENCOUNTER — Other Ambulatory Visit: Payer: Self-pay | Admitting: Radiation Therapy

## 2017-05-24 ENCOUNTER — Encounter: Payer: Self-pay | Admitting: Medical

## 2017-05-24 ENCOUNTER — Telehealth: Payer: Self-pay | Admitting: Medical Oncology

## 2017-05-24 DIAGNOSIS — C3492 Malignant neoplasm of unspecified part of left bronchus or lung: Secondary | ICD-10-CM

## 2017-05-24 DIAGNOSIS — R590 Localized enlarged lymph nodes: Secondary | ICD-10-CM

## 2017-05-24 DIAGNOSIS — C3412 Malignant neoplasm of upper lobe, left bronchus or lung: Secondary | ICD-10-CM

## 2017-05-24 MED ORDER — OXYCODONE-ACETAMINOPHEN 5-325 MG PO TABS: 1.0000 | ORAL_TABLET | Freq: Four times a day (QID) | ORAL | 0 refills | Status: DC | PRN

## 2017-05-24 NOTE — Telephone Encounter (Signed)
Pt states the MSIR 15 mg  q 4 hours is too strong during the day. It is fine if she takes HS . Her percocet is 1 q 8. Can she take it more often during day instead of MSIR. ?

## 2017-05-24 NOTE — Progress Notes (Signed)
[facility] Progress Note   COOKIE PORE [id number] 1965-10-15 51 y.o.  Debbie Baker is managed by Dr. Eilleen Kempf  Actively treated with chemotherapy: yes  Current Therapy: Bevacizumab, carboplatin, Alimta, and dexamethasone  Last Treated:  05/22/2017  Assessment: Plan:    Upper respiratory tract infection, unspecified type   Upper respiratory tract infection: The patient was given a prescription for a Z-Pak and a prednisone taper.  She was told to begin her prednisone taper after she completes her dexamethasone on 05/23/2017 as part of her chemotherapy treatment.  Please see After Visit Summary for patient specific instructions.  Future Appointments  Date Time Provider [place]  06/07/2017  2:30 PM [facility] LAB 2 [facility] None  06/07/2017  3:00 PM Curcio, Roselie Awkward, NP [facility] None  06/15/2017  8:45 AM [facility] LAB 1 [facility] None  06/15/2017  9:15 AM Curt Bears, MD [facility] None  06/15/2017 10:15 AM [facility] G23 [facility] None    No orders of the defined types were placed in this encounter.      Subjective:   Patient ID:  Debbie Baker is a 51 y.o. (DOB 1965-08-31) female.  Chief Complaint: No chief complaint on file.   HPI Debbie Baker is a 51 year old female originally with an unresectable stage IIIa non-small cell lung cancer, adenocarcinoma status post a course of concurrent chemotherapy and radiation with a partial response.  She had most recently been treated with immunotherapy with Imfinzi with 17 cycles given.  She tolerated this treatment well, however she is recently been having fatigue and shortness of breath.  She was referred for a restaging CT scan of the abdomen and pelvis along with an MRI of the brain.  Her scans showed a new central right hepatic lobe lesion and a left hepatic lobe lesion with an increase in size of pleural-based nodules in the left and right lower pulmonary lobes.  An MRI of the brain  showed 2 new subcentimeter lesions in the cerebellum which are suspicious for metastatic deposits.  The patient's insurance would not approve for a CT scan of the chest despite the fact that the patient had had a history of lung cancer.  A stat CT of the chest was ordered.  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  She is being seen today while receiving chemotherapy.  She reports that she has been having increasing shortness of breath, nonproductive cough, and wheezing.  She reports that she has had a history of pneumonia or pneumonitis and has had to be treated previously.  She denies fevers, chills, or sweats.  Medications: I have reviewed the patient's current medications.  Allergies:  Allergies  Allergen Reactions  . Penicillins Anaphylaxis and Rash     Has patient had a PCN reaction causing immediate rash, facial/tongue/throat swelling, SOB or lightheadedness with hypotension: # # YES # # Has patient had a PCN reaction causing severe rash involving mucus membranes or skin necrosis: No Has patient had a PCN reaction that required hospitalization No Has patient had a PCN reaction occurring within the last 10 years: # # YES # #  If all of the above answers are "NO", then may proceed with Cephalosporin use.   . Chantix [Varenicline] Other (See Comments)    Nightmares/lack of sleep  . Levaquin [Levofloxacin] Other (See Comments)  Muscle weakness/difficulty walking    Past Medical History:  Diagnosis Date  . Allergic rhinitis   . Deaf, left   . Diverticulosis    sigmoid colon  . Dyspnea   . Encounter for antineoplastic chemotherapy 04/18/2016  . Encounter for antineoplastic immunotherapy 07/21/2016  . GERD (gastroesophageal reflux disease)   . Hyperlipidemia   . Lung cancer ([place]) 03/2016     non small cell lung cancer favoring adenocarcinoma   . Lung nodules   . Migraine   . Odynophagia 05/24/2016  . Pneumonia   . PONV (postoperative nausea and vomiting)   . Tachycardia, paroxysmal ([place]) 09/08/2016  . Wears glasses     Past Surgical History:  Procedure Laterality Date  . ABDOMINAL HYSTERECTOMY    . VIDEO BRONCHOSCOPY Bilateral 07/03/2015   Procedure: VIDEO BRONCHOSCOPY WITH FLUORO;  Surgeon: Marshell Garfinkel, MD;  Location: [facility];  Service: Cardiopulmonary;  Laterality: Bilateral;  . VIDEO BRONCHOSCOPY WITH ENDOBRONCHIAL ULTRASOUND N/A 04/06/2016   Procedure: VIDEO BRONCHOSCOPY WITH ENDOBRONCHIAL ULTRASOUND;  Surgeon: Collene Gobble, MD;  Location: [facility];  Service: Thoracic;  Laterality: N/A;  . WRIST FRACTURE SURGERY      Family History  Problem Relation Age of Onset  . Esophageal cancer Father   . Breast cancer Mother     Social History   Socioeconomic History  . Marital status: Single    Spouse name: Not on file  . Number of children: Not on file  . Years of education: Not on file  . Highest education level: Not on file  Social Needs  . Financial resource strain: Not on file  . Food insecurity - worry: Not on file  . Food insecurity - inability: Not on file  . Transportation needs - medical: Not on file  . Transportation needs - non-medical: Not on file  Occupational History  . Not on file  Tobacco Use  . Smoking status: Former Smoker    Packs/day: 1.50    Years: 30.00    Pack years: 45.00    Types: Cigarettes    Last attempt to quit: 08/02/2010    Years since quitting: 6.8  . Smokeless tobacco: Never Used  Substance and Sexual Activity  . Alcohol use: Yes    Alcohol/week: 0.0 oz    Comment: 1-2 drinks daily  . Drug use: Yes    Types: Marijuana    Comment: last use 02/2016  . Sexual activity: Yes    Birth control/protection: Surgical  Other Topics Concern  . Not on file  Social History Narrative   10 cats   Lives with boyfriend   Not  employed   Former employee of [employer]   3 children living in [place]    Past Medical History, Surgical history, Social history, and Family history were reviewed and updated as appropriate.   Please see review of systems for further details on the patient's review from today.   Review of Systems:  Review of Systems  Constitutional: Negative for chills, diaphoresis and fever.  HENT: Negative for postnasal drip, sinus pressure and sinus pain.   Respiratory: Positive for cough, shortness of breath and wheezing. Negative for chest tightness.   Cardiovascular: Negative for chest pain.    Objective:   Physical Exam:  There were no vitals taken for this visit. ECOG: 1  Physical Exam  Constitutional: No distress.  HENT:  Head: Normocephalic and atraumatic.  Mouth/Throat: Oropharynx is clear and moist. No oropharyngeal exudate.  Eyes: Right eye exhibits no  discharge. Left eye exhibits no discharge. No scleral icterus.  Neck: Normal range of motion. Neck supple.  Cardiovascular: Normal rate, regular rhythm and normal heart sounds. Exam reveals no gallop and no friction rub.  No murmur heard. Pulmonary/Chest: Effort normal. No respiratory distress. She has wheezes (LUL). She has no rales.  Lymphadenopathy:    She has no cervical adenopathy.  Neurological: She is alert.  Skin: Skin is warm and dry. No rash noted. She is not diaphoretic. No erythema.    Lab Review:     Component Value Date/Time   NA 140 05/22/2017 1212   K 4.2 05/22/2017 1212   CL 100 (L) 05/19/2017 1124   CO2 21 (L) 05/22/2017 1212   GLUCOSE 116 05/22/2017 1212   BUN 10.3 05/22/2017 1212   CREATININE 0.8 05/22/2017 1212   CALCIUM 9.6 05/22/2017 1212   PROT 7.8 05/22/2017 1212   ALBUMIN 4.3 05/22/2017 1212   AST 17 05/22/2017 1212   ALT 27 05/22/2017 1212   ALKPHOS 113 05/22/2017 1212   BILITOT 0.31 05/22/2017 1212   GFRNONAA >60 05/19/2017 1124   GFRAA >60 05/19/2017 1124         Component Value Date/Time   WBC 13.6 (H) 05/22/2017 1213   WBC 5.6 05/19/2017 1124   RBC 4.18 05/22/2017 1213   RBC 4.25 05/19/2017 1124   HGB 13.1 05/22/2017 1213   HCT 39.0 05/22/2017 1213   PLT 313 05/22/2017 1213   MCV 93.3 05/22/2017 1213   MCH 31.4 05/22/2017 1213   MCH 32.0 05/19/2017 1124   MCHC 33.6 05/22/2017 1213   MCHC 34.2 05/19/2017 1124   RDW 12.4 05/22/2017 1213   LYMPHSABS 0.9 05/22/2017 1213   MONOABS 0.6 05/22/2017 1213   EOSABS 0.0 05/22/2017 1213   BASOSABS 0.1 05/22/2017 1213   -------------------------------  Imaging from last 24 hours (if applicable):  Radiology interpretation: Ct Chest W Contrast  Result Date: 05/18/2017 CLINICAL DATA:  51 year old female complaining of shortness of breath for the past 1.5 months and sternal pain for the past 2 months. Intermittent hemoptysis. 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 EXAM: CT CHEST WITH CONTRAST TECHNIQUE: Multidetector CT imaging of the chest was performed during intravenous contrast administration. CONTRAST:  69mL ISOVUE-300 IOPAMIDOL (ISOVUE-300) INJECTION 61% COMPARISON:  75 mL of Isovue-300. FINDINGS: Cardiovascular: Heart size is normal. Increasing areas of pericardial thickening and nodularity. This is most evident just lateral to the left side of the pulmonic trunk (axial image 59 of series 2, and adjacent to the left ventricular apex (axial image 90 of series 2. There is a new nodular area of pericardial thickening on axial image 90 of series 2 measuring 12 x 8 mm. These findings suggest developing pericardial metastasis, presumably from direct invasion from the previously demonstrated left hilar mass. Atherosclerosis in the thoracic aorta. No coronary artery calcifications. Mediastinum/Nodes: Enlarged high right paratracheal lymph node measuring 17 mm in short axis with  low-attenuation center. Prominent soft tissue in the left hilar region, without discrete enlarged lymph node. New prominent but nonenlarged lymph node measuring 8 mm in short axis adjacent to the low-attenuation lower esophagus (axial image 97 of series 2). Low left paratracheal lymph node measuring 11 mm in short axis, similar to the prior study. Esophagus is unremarkable in appearance. No axillary lymphadenopathy. 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 However, the areas of nodularity in the inferior segment of the lingula appear increased compared to the prior examination. The largest of these is on axial image 79 of series 7, currently measuring 19 x 14 mm (previously 14 x 10 mm when measured in a similar fashion on exam 01/31/2017. Another nodule currently measures 16 x 10 mm (axial image 73 of series 7), previously 7 x 10 mm when measured in a similar fashion on the prior exam. New nodules are also noted in the inferior segment of the lingula, largest of which measures 7 mm on axial image 73 of series 7. Previously noted subpleural nodule in the posteromedial aspect of the right lower lobe currently measures 12 x 6 mm (previously 6 mm). Other new and enlarging nodules are also noted, including a new 5 mm nodule in the right lower lobe (axial image 135 of series 7), a left lower lobe nodule (image 104 of series 7) which currently measures 5 x 6 mm (previously only 3 mm), and a 4 mm right lower lobe nodule (axial image 98 of series 7) which previously measured only 2 mm. Tiny calcified granuloma in the right upper lobe incidentally noted. No acute consolidative airspace disease. Trace left pleural effusion partially loculated superiorly and lying dependently inferiorly, similar to the prior examination. No right pleural effusion. Upper Abdomen: Several new  hypovascular hepatic lesions are noted, highly concerning for new metastatic disease to the liver. The largest of these is in the central aspect of segment 8 (axial image 138 of series 2) measuring 1.9 x 2.7 cm. Subcentimeter low-attenuation lesions in both kidneys are too small to definitively characterize, but are statistically likely to represent tiny cysts. Aortic atherosclerosis. Musculoskeletal: There are no aggressive appearing lytic or blastic lesions noted in the visualized portions of the skeleton. IMPRESSION: 1. 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 2. Additional incidental findings, as above. Aortic Atherosclerosis (ICD10-I70.0). Electronically Signed   By: Vinnie Langton M.D.   On: 05/18/2017 11:35   Mr Jeri Cos BO Contrast  Result Date: 05/02/2017 CLINICAL DATA:  Non-small cell lung cancer.  Staging. EXAM: MRI HEAD WITHOUT AND WITH CONTRAST TECHNIQUE: Multiplanar, multiecho pulse sequences of the brain and surrounding structures were obtained without and with intravenous contrast. CONTRAST:  18 mL MultiHance COMPARISON:  04/23/2016 FINDINGS: Brain: There is no evidence of acute infarct, intracranial hemorrhage, midline shift, or extra-axial fluid collection. The ventricles and sulci are within normal limits for age. Small developmental venous anomalies are again seen in both frontal lobes. There are 2 new small foci of enhancement in the cerebellum which measure 2 mm on the right (series 15, image 13) and 5 mm on the left (series 15, image 14). There is faint FLAIR hyperintensity associated with the left cerebellar focus, however there is no significant edema and there is no associated diffusion abnormality. No definite enhancing supratentorial lesions are identified. Faint rounded  foci of increased signal in the posterior right frontal lobe on the axial T1 postcontrast sequence (series 15, images 44 and 45) are not confirmed on the other planes and are considered to be artifactual. Vascular: Major intracranial vascular flow voids are preserved. Skull and upper cervical spine: No suspicious marrow lesion. Sinuses/Orbits: Unremarkable orbits. Minimal paranasal sinus mucosal thickening. Clear mastoid air  cells. Other: None. IMPRESSION: Two new subcentimeter enhancing foci in the cerebellum. These are suspicious for metastases, although no edema or supratentorial lesions are present. Subacute infarcts are an additional consideration, however no supportive findings such as diffusion signal abnormality or chronic infarcts are present to favor ischemia over metastases. Short-term follow-up MRI could be considered in 4-6 weeks if clinically appropriate rather than immediately initiating treatment. Electronically Signed   By: Logan Bores M.D.   On: 05/02/2017 16:45   Ct Abdomen Pelvis W Contrast  Result Date: 05/17/2017 CLINICAL DATA:  Epigastric pain for several months. History of lung cancer. EXAM: CT ABDOMEN AND PELVIS WITH CONTRAST TECHNIQUE: Multidetector CT imaging of the abdomen and pelvis was performed using the standard protocol following bolus administration of intravenous contrast. CONTRAST:  187mL ISOVUE-300 IOPAMIDOL (ISOVUE-300) INJECTION 61% COMPARISON:  PET-CT 04/04/2016, CT chest 01/31/2017 in the pole of the FINDINGS: Lower chest: Subpleural nodule in the RIGHT lower lobe measures 10 mm (image number 1, series 3) which compares to 6 mm on CT 01/31/2017. Very small subpleural nodule in the LEFT lower lobe anteriorly measures on image 7, series 3 is not changed. Nodule over the hemidiaphragm measuring 5 mm (images 6, series 3) compares with 4 mm. Hepatobiliary: Round 2.3 cm lesion in the central RIGHT hepatic lobe (segment 8, image 19, series 2) is new from comparison exams.  Additional small hypodense lesion in subcapsular LEFT hepatic lobe (segment 4A) is also not seen on comparison exams. Pancreas: Pancreas is normal. No ductal dilatation. No pancreatic inflammation. Spleen: Normal spleen Adrenals/urinary tract: Adrenal glands and kidneys are normal. The ureters and bladder normal. Stomach/Bowel: Stomach, small bowel, appendix, and cecum are normal. Multiple diverticular of the descending colon sigmoid colon. Small fluid collection along the LEFT external iliac vessels measuring 3.4 cm has low simple fluid attenuation and not changed from prior. The RIGHT ovary has a similar pattern to this LEFT lesion in both lesions are felt represent ovaries. Vascular/Lymphatic: Abdominal aorta is normal caliber. There is no retroperitoneal or periportal lymphadenopathy. No pelvic lymphadenopathy. Reproductive: Post hysterectomy anatomy Other: No free fluid. Musculoskeletal: No aggressive osseous lesion IMPRESSION: 1. New central RIGHT hepatic lobe lesion most consistent with hepatic metastasis. 2. Second low-density lesion LEFT hepatic lobe concerning for metastatic lesion 3. Interval increase in size of pleural-based nodules in LEFT and RIGHT lower lobe is concerning for lung cancer recurrence. These results will be called to the ordering clinician or representative by the Radiologist Assistant, and communication documented in the PACS or zVision Dashboard. Electronically Signed   By: Suzy Bouchard M.D.   On: 05/17/2017 16:42   US Biopsy (liver)  Result Date: 05/19/2017 CLINICAL DATA:  History of metastatic adenocarcinoma of the lung with worsening metastatic disease in the liver and lungs. Liver biopsy requested to assess tumor with molecular studies. EXAM: ULTRASOUND GUIDED CORE BIOPSY OF LIVER MEDICATIONS: 4.0 mg IV Versed; 200 mcg IV Fentanyl Total Moderate Sedation Time: 47 minutes. The patient's level of consciousness and physiologic status were continuously monitored during the  procedure by Radiology nursing. PROCEDURE: The procedure, risks, benefits, and alternatives were explained to the patient. Questions regarding the procedure were encouraged and answered. The patient understands and consents to the procedure. A time out was performed prior to initiating the procedure. The abdominal wall was prepped with chlorhexidine in a sterile fashion, and a sterile drape was applied covering the operative field. A sterile gown and sterile gloves were used for the procedure. Local anesthesia was provided with 1%  Lidocaine. Ultrasound was performed of the liver. The largest lesion in the central right lobe was chosen for sampling. This was approached initially from a more anterior approach with advancement of a 17 gauge needle. Ultimately, this approach was abandoned and the needle removed. The lesion was then approached from a more lateral approach. Under ultrasound guidance, a 17 gauge needle was advanced to the margin of the lesion. Three separate coaxial 18 gauge core biopsy samples were obtained and submitted in formalin. A slurry of cut Gel-Foam pledgets were then injected through the outer needle as it was retracted after completion of the procedure. COMPLICATIONS: None. FINDINGS: Central lesion in the right lobe of the liver measures approximately 3 cm in diameter. This is positioned adjacent to the central aspect of the right portal vein as well as central bile ducts. Smaller lesions in the higher left lobe of the liver were visible but due to subcapsular positioning and more superior location, could not be accurately sampled. From an initial anterior approach, a 17 gauge needle was advanced. It became apparent during needle advancement that the entire course of the needle would be difficult to visualize due to shadowing from adjacent ribs and cartilage. Also, along this approach it was felt that there would be higher risk of traversing central portal vein branches and bile ducts. This  approach was therefore abandoned. From a lateral approach, a safer window was found which did not traverse major vessels or bile ducts. After a needle was advanced to the edge of the lesion, coaxial core biopsy samples were obtained yielding solid tissue. IMPRESSION: Ultrasound-guided biopsy performed of the central right lobe mass measuring approximately 3 cm. Solid tissue was obtained. Electronically Signed   By: Aletta Edouard M.D.   On: 05/19/2017 17:05        This patient was seen with Dr. Julien Nordmann with my treatment plan reviewed with him. He expressed agreement with my medical management of this patient.  ADDENDUM: Hematology/Oncology Attending: I had a face-to-face encounter with the patient.  I recommended her care plan.  This is a very pleasant 51 years old white female with now metastatic non-small cell lung cancer, adenocarcinoma with new brain and liver metastasis.  This was initially diagnosed as a stage IIIa status post a course of concurrent chemoradiation followed by 18 cycles of consolidation immunotherapy with Imfinzi (Durvalumab) discontinued recently because of the disease progression. The patient had repeat imaging studies including CT scan of the chest as well as CT of the abdomen and pelvis that showed concerning findings for disease progression. She underwent ultrasound-guided core biopsy of 1 of the liver lesion that was consistent with metastatic adenocarcinoma and the tissue block would be sent for molecular studies and PDL 1 expression.  She was tested in the past with blood tests that showed no actionable mutation but there was insufficient tissue at that time. Because of the rapid disease progression, we discussed with the patient treatment options including palliative care versus systemic chemotherapy with carboplatin for AUC of 5, Alimta 500 mg/M2 and Avastin 15 mg/KG every 3 weeks.  The patient was interested in proceeding with systemic chemotherapy as soon as  possible. She is here today to start the first cycle of this treatment.  I discussed with the patient the adverse effect of this treatment including but not limited to alopecia, myelosuppression, nausea and vomiting, peripheral neuropathy, liver or renal dysfunction as well as bleeding risk from treatment with Avastin. She would like to proceed with the treatment as  planned.  She will come back for follow-up visit the first week of January after she comes back from [place]. The patient was advised to call immediately if she has any concerning symptoms in the interval.  Disclaimer: This note was dictated with voice recognition software. Similar sounding words can inadvertently be transcribed and may be missed upon review. Eilleen Kempf, MD 05/24/17

## 2017-05-25 ENCOUNTER — Encounter: Payer: Self-pay | Admitting: *Deleted

## 2017-05-25 NOTE — Progress Notes (Signed)
Oncology Nurse Navigator Documentation  Oncology Nurse Navigator Flowsheets 05/25/2017  Navigator Location [facility]  Navigator Encounter Type Other/per Mikey Bussing, I requested foundation one and PDL 1 on recent biopsy.  Treatment Phase Treatment  Barriers/Navigation Needs Coordination of Care  Interventions Coordination of Care  Coordination of Care Other  Acuity Level 2  Time Spent with Patient 30

## 2017-06-01 ENCOUNTER — Ambulatory Visit: Payer: 59

## 2017-06-01 ENCOUNTER — Other Ambulatory Visit: Payer: 59

## 2017-06-01 ENCOUNTER — Ambulatory Visit: Payer: 59 | Admitting: Oncology

## 2017-06-07 ENCOUNTER — Encounter (HOSPITAL_COMMUNITY): Payer: Self-pay

## 2017-06-07 ENCOUNTER — Other Ambulatory Visit: Payer: Self-pay

## 2017-06-07 ENCOUNTER — Inpatient Hospital Stay: Payer: No Typology Code available for payment source | Attending: Oncology | Admitting: Oncology

## 2017-06-07 ENCOUNTER — Telehealth: Payer: Self-pay

## 2017-06-07 ENCOUNTER — Encounter: Payer: Self-pay | Admitting: Oncology

## 2017-06-07 ENCOUNTER — Inpatient Hospital Stay: Payer: No Typology Code available for payment source

## 2017-06-07 VITALS — BP 118/88 | HR 88 | Temp 97.7°F | Resp 18 | Ht 67.5 in | Wt 199.8 lb

## 2017-06-07 DIAGNOSIS — C3492 Malignant neoplasm of unspecified part of left bronchus or lung: Secondary | ICD-10-CM

## 2017-06-07 DIAGNOSIS — I479 Paroxysmal tachycardia, unspecified: Secondary | ICD-10-CM

## 2017-06-07 DIAGNOSIS — Z5111 Encounter for antineoplastic chemotherapy: Secondary | ICD-10-CM | POA: Insufficient documentation

## 2017-06-07 DIAGNOSIS — C3412 Malignant neoplasm of upper lobe, left bronchus or lung: Secondary | ICD-10-CM | POA: Insufficient documentation

## 2017-06-07 DIAGNOSIS — R05 Cough: Secondary | ICD-10-CM | POA: Diagnosis not present

## 2017-06-07 DIAGNOSIS — R590 Localized enlarged lymph nodes: Secondary | ICD-10-CM

## 2017-06-07 DIAGNOSIS — Z79899 Other long term (current) drug therapy: Secondary | ICD-10-CM

## 2017-06-07 DIAGNOSIS — Z923 Personal history of irradiation: Secondary | ICD-10-CM

## 2017-06-07 DIAGNOSIS — K219 Gastro-esophageal reflux disease without esophagitis: Secondary | ICD-10-CM | POA: Diagnosis not present

## 2017-06-07 DIAGNOSIS — R5383 Other fatigue: Secondary | ICD-10-CM | POA: Insufficient documentation

## 2017-06-07 DIAGNOSIS — C7931 Secondary malignant neoplasm of brain: Secondary | ICD-10-CM | POA: Diagnosis not present

## 2017-06-07 DIAGNOSIS — J329 Chronic sinusitis, unspecified: Secondary | ICD-10-CM

## 2017-06-07 DIAGNOSIS — E785 Hyperlipidemia, unspecified: Secondary | ICD-10-CM | POA: Diagnosis not present

## 2017-06-07 DIAGNOSIS — Z880 Allergy status to penicillin: Secondary | ICD-10-CM

## 2017-06-07 DIAGNOSIS — Z7952 Long term (current) use of systemic steroids: Secondary | ICD-10-CM

## 2017-06-07 DIAGNOSIS — C787 Secondary malignant neoplasm of liver and intrahepatic bile duct: Secondary | ICD-10-CM | POA: Diagnosis not present

## 2017-06-07 DIAGNOSIS — R0789 Other chest pain: Secondary | ICD-10-CM | POA: Diagnosis not present

## 2017-06-07 DIAGNOSIS — Z9225 Personal history of immunosupression therapy: Secondary | ICD-10-CM | POA: Diagnosis not present

## 2017-06-07 DIAGNOSIS — R51 Headache: Secondary | ICD-10-CM

## 2017-06-07 DIAGNOSIS — K5900 Constipation, unspecified: Secondary | ICD-10-CM | POA: Diagnosis not present

## 2017-06-07 DIAGNOSIS — Z9221 Personal history of antineoplastic chemotherapy: Secondary | ICD-10-CM

## 2017-06-07 LAB — COMPREHENSIVE METABOLIC PANEL
ALBUMIN: 3.7 g/dL (ref 3.5–5.0)
ALT: 78 U/L — AB (ref 0–55)
AST: 42 U/L — AB (ref 5–34)
Alkaline Phosphatase: 125 U/L (ref 40–150)
Anion gap: 8 (ref 3–11)
BUN: 11 mg/dL (ref 7–26)
CO2: 26 mmol/L (ref 22–29)
CREATININE: 0.91 mg/dL (ref 0.60–1.10)
Calcium: 9.4 mg/dL (ref 8.4–10.4)
Chloride: 106 mmol/L (ref 98–109)
GFR calc Af Amer: >60 mL/min (ref >60)
GFR calc non Af Amer: >60 mL/min (ref >60)
GLUCOSE: 85 mg/dL (ref 70–140)
POTASSIUM: 4.4 mmol/L (ref 3.3–4.7)
Sodium: 140 mmol/L (ref 136–145)
TOTAL PROTEIN: 7.2 g/dL (ref 6.4–8.3)
Total Bilirubin: 0.2 mg/dL (ref 0.2–1.2)

## 2017-06-07 LAB — CBC WITH DIFFERENTIAL/PLATELET
ABS GRANULOCYTE: 4.4 10*3/uL (ref 1.5–6.5)
BASOS PCT: 0 %
Basophils Absolute: 0 10*3/uL (ref 0.0–0.1)
Eosinophils Absolute: 0.1 10*3/uL (ref 0.0–0.5)
Eosinophils Relative: 1 %
HCT: 39.3 % (ref 34.8–46.6)
Hemoglobin: 13 g/dL (ref 11.6–15.9)
LYMPHS PCT: 16 %
Lymphs Abs: 1 10*3/uL (ref 0.9–3.3)
MCH: 31.9 pg (ref 25.1–34.0)
MCHC: 33.1 g/dL (ref 31.5–36.0)
MCV: 96.3 fL (ref 79.5–101.0)
MONO ABS: 0.6 10*3/uL (ref 0.1–0.9)
MONOS PCT: 9 %
Neutro Abs: 4.4 10*3/uL (ref 1.5–6.5)
Neutrophils Relative %: 74 %
Platelets: 234 10*3/uL (ref 145–400)
RBC: 4.08 MIL/uL (ref 3.70–5.45)
RDW: 12.4 % (ref 11.2–16.1)
WBC: 6 10*3/uL (ref 3.9–10.3)

## 2017-06-07 MED ORDER — OXYCODONE-ACETAMINOPHEN 5-325 MG PO TABS: 1.0000 | ORAL_TABLET | Freq: Four times a day (QID) | ORAL | 0 refills | Status: DC | PRN

## 2017-06-07 MED ORDER — DOXYCYCLINE HYCLATE 100 MG PO TABS: 100.0000 mg | ORAL_TABLET | Freq: Two times a day (BID) | ORAL | 0 refills | Status: DC

## 2017-06-07 MED ORDER — LIDOCAINE-PRILOCAINE 2.5-2.5 % EX CREA: 1.0000 "application " | TOPICAL_CREAM | CUTANEOUS | 2 refills | Status: DC | PRN

## 2017-06-07 MED ORDER — BENZONATATE 100 MG PO CAPS: 100.0000 mg | ORAL_CAPSULE | ORAL | 1 refills | Status: DC | PRN

## 2017-06-07 NOTE — Progress Notes (Signed)
[place] OFFICE PROGRESS NOTE  Hulan Fess, MD [place] [address]  DIAGNOSIS: Stage IIIA (T2a, N2, M0) non-small cell lung cancer, adenocarcinoma presented with left suprahilar mass, mediastinal lymphadenopathy and suspicious pulmonary nodule in the left upper lobe diagnosed in November 2017. No actionable mutations on [employer] 360 testing.  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  PRIOR THERAPY: 1) Status post concurrent chemoradiation with weekly carboplatin for AUC of 2 and paclitaxel 45 MG/M2 for 7 cycles. Last dose was given 06/13/2016. 2) Immunotherapy with Imfinzi (Durvalumab) 10 MG/KG every 2 weeks. First dose 08/11/2016. Status post 17cycles.   CURRENT THERAPY: Systemic chemotherapy with carboplatin for an AUC of 5, Alimta 500 mg/m and Avastin milligrams per kilogram given every 3 weeks.  First dose 05/22/2017.  Status post 1 cycle.  INTERVAL HISTORY: Debbie Baker 52 y.o. female returns for routine follow-up visit by herself.  The patient is feeling fine today and has no specific complaints.  She tolerated her first cycle of chemotherapy well overall with the exception of constipation.  She has been taking Senokot-S and MiraLAX which has been effective.  She reports increased sinus congestion and was recently treated with a Z-Pak.  She did not notice improvement in her symptoms.  She continues to have facial tenderness and a productive cough.  She denies fevers and chills.  Denies chest pain and hemoptysis.  She reports dyspnea on exertion.  Denies nausea, vomiting, diarrhea.  The patient's pain is controlled with Percocet and she requests a refill on this today.  She recently had a second opinion at [facility] for treatment of her condition.  The patient is here for evaluation and repeat blood work and to discuss recommendations from her second opinion.  MEDICAL HISTORY: Past  Medical History:  Diagnosis Date  . Allergic rhinitis   . Deaf, left   . Diverticulosis    sigmoid colon  . Dyspnea   . Encounter for antineoplastic chemotherapy 04/18/2016  . Encounter for antineoplastic immunotherapy 07/21/2016  . GERD (gastroesophageal reflux disease)   . Hyperlipidemia   . Lung cancer ([place]) 03/2016   non small cell lung cancer favoring adenocarcinoma   . Lung nodules   . Migraine   . Odynophagia 05/24/2016  . Pneumonia   . PONV (postoperative nausea and vomiting)   . Tachycardia, paroxysmal ([place]) 09/08/2016  . Wears glasses     ALLERGIES:  is allergic to penicillins; chantix [varenicline]; and levaquin [levofloxacin].  MEDICATIONS:  Current Outpatient Medications  Medication Sig Dispense Refill  . benzonatate (TESSALON) 100 MG capsule Take 1 capsule (100 mg total) by mouth every 4 (four) hours as needed for cough. 60 capsule 1  . Calcium-Magnesium-Vitamin D 185-50-100 MG-MG-UNIT CAPS Take 1 tablet by mouth every evening.    . cetirizine (ZYRTEC) 10 MG tablet Take 10 mg by mouth daily.    Marland Kitchen dexamethasone (DECADRON) 4 MG tablet Take 1 tablet twice a day the day before, day of, and day after each cycle of chemotherapy. 30 tablet 1  . diphenhydrAMINE (BENADRYL) 25 mg capsule Take 25 mg by mouth at bedtime as needed.    Marland Kitchen ELDERBERRY PO Take by mouth every morning.    . folic acid (FOLVITE) 1 MG tablet Take 1 tablet (1 mg total) by mouth daily. 30 tablet 2  . Green Tea, Camillia sinensis, (GREEN TEA EXTRACT PO) Take by mouth.    . Lactobacillus (PROBIOTIC  ACIDOPHILUS) CAPS Take by mouth.    . Milk Thistle 1000 MG CAPS Take 1 capsule by mouth daily.    . Misc Natural Products (BLACK COHOSH MENOPAUSE COMPLEX) TABS Take 1 capsule by mouth 2 (two) times daily.    . Misc Natural Products (TURMERIC CURCUMIN) CAPS Take 1 capsule by mouth every evening. 1500 mg capsule    . Multiple Vitamins-Minerals (WOMENS 50+ MULTI VITAMIN/MIN) TABS Take 1 tablet by mouth daily.    Marland Kitchen  OVER THE COUNTER MEDICATION Take 250 mg by mouth daily. Jiaogulan Supplement    . pantoprazole (PROTONIX) 40 MG tablet Take 1 tablet (40 mg total) 2 (two) times daily by mouth. 180 tablet 3  . polyethylene glycol (MIRALAX / GLYCOLAX) packet Take 17 g by mouth daily.    . pravastatin (PRAVACHOL) 20 MG tablet Take 20 mg by mouth every evening.  3  . senna (SENOKOT) 8.6 MG tablet Take 1 tablet by mouth daily.    Marland Kitchen albuterol (PROVENTIL HFA;VENTOLIN HFA) 108 (90 Base) MCG/ACT inhaler Inhale 2 puffs into the lungs every 6 (six) hours as needed for wheezing or shortness of breath. (Patient not taking: Reported on 05/18/2017) 1 Inhaler 0  . doxycycline (VIBRA-TABS) 100 MG tablet Take 1 tablet (100 mg total) by mouth 2 (two) times daily. 20 tablet 0  . durvalumab (IMFINZI) 120 MG/2.4ML SOLN injection Inject 120 mg into the vein every 14 (fourteen) days.     Marland Kitchen lidocaine-prilocaine (EMLA) cream Apply 1 application topically as needed. 30 g 2  . LORazepam (ATIVAN) 0.5 MG tablet Take 1 tablet (0.5 mg total) by mouth as needed for anxiety (take 30 min prior to MRI scans and treatments). (Patient not taking: Reported on 06/07/2017) 30 tablet 0  . morphine (MSIR) 15 MG tablet Take 1 tablet (15 mg total) by mouth every 4 (four) hours as needed for severe pain. (Patient not taking: Reported on 06/07/2017) 40 tablet 0  . oxyCODONE-acetaminophen (PERCOCET/ROXICET) 5-325 MG tablet Take 1 tablet by mouth every 6 (six) hours as needed (Chronic pain management). 60 tablet 0  . predniSONE (DELTASONE) 10 MG tablet 6 tab x 1 day, 5 tab x 1 day, 4 tab x 1 day, 3 tab x 1 day, 2 tab x 1 day, 1 tab x 1 day, begin on 12/26 (Patient not taking: Reported on 06/07/2017) 21 tablet 0  . prochlorperazine (COMPAZINE) 10 MG tablet Take 1 tablet (10 mg total) by mouth every 6 (six) hours as needed for nausea or vomiting. (Patient not taking: Reported on 05/18/2017) 30 tablet 0  . prochlorperazine (COMPAZINE) 10 MG tablet Take 1 tablet (10 mg total)  by mouth every 6 (six) hours as needed for nausea or vomiting. (Patient not taking: Reported on 06/07/2017) 30 tablet 0   No current facility-administered medications for this visit.     SURGICAL HISTORY:  Past Surgical History:  Procedure Laterality Date  . ABDOMINAL HYSTERECTOMY    . VIDEO BRONCHOSCOPY Bilateral 07/03/2015   Procedure: VIDEO BRONCHOSCOPY WITH FLUORO;  Surgeon: Marshell Garfinkel, MD;  Location: [facility];  Service: Cardiopulmonary;  Laterality: Bilateral;  . VIDEO BRONCHOSCOPY WITH ENDOBRONCHIAL ULTRASOUND N/A 04/06/2016   Procedure: VIDEO BRONCHOSCOPY WITH ENDOBRONCHIAL ULTRASOUND;  Surgeon: Collene Gobble, MD;  Location: [facility];  Service: Thoracic;  Laterality: N/A;  . WRIST FRACTURE SURGERY      REVIEW OF SYSTEMS:   Review of Systems  Constitutional: Negative for appetite change, chills, fatigue, fever and unexpected weight change.  HENT:   Negative for  mouth sores, nosebleeds, sore throat and trouble swallowing.  Positive for facial pain. Eyes: Negative for eye problems and icterus.  Respiratory: Negative for hemoptysis, shortness of breath at rest and wheezing.  Positive for cough and dyspnea on exertion. Cardiovascular: Negative for chest pain and leg swelling.  Gastrointestinal: Negative for abdominal pain, diarrhea, nausea and vomiting. Positive for constipation. Genitourinary: Negative for bladder incontinence, difficulty urinating, dysuria, frequency and hematuria.   Musculoskeletal: Negative for back pain, gait problem, neck pain and neck stiffness. Positive for shoulder pain. Skin: Negative for itching and rash.  Neurological: Negative for dizziness, extremity weakness, gait problem, headaches, light-headedness and seizures.  Hematological: Negative for adenopathy. Does not bruise/bleed easily.  Psychiatric/Behavioral: Negative for confusion, depression and sleep disturbance. The patient is not nervous/anxious.     PHYSICAL EXAMINATION:  Blood pressure 118/88,  pulse 88, temperature 97.7 F (36.5 C), temperature source Oral, resp. rate 18, height 5' 7.5" (1.715 m), weight 199 lb 12.8 oz (90.6 kg), SpO2 99 %.  ECOG PERFORMANCE STATUS: 1 - Symptomatic but completely ambulatory  Physical Exam  Constitutional: Oriented to person, place, and time and well-developed, well-nourished, and in no distress. No distress.  HENT:  Head: Normocephalic and atraumatic.  Mouth/Throat: Oropharynx is clear and moist. No oropharyngeal exudate. Tenderness with palpation to the maxillary sinuses. Eyes: Conjunctivae are normal. Right eye exhibits no discharge. Left eye exhibits no discharge. No scleral icterus.  Neck: Normal range of motion. Neck supple.  Cardiovascular: Normal rate, regular rhythm, normal heart sounds and intact distal pulses.   Pulmonary/Chest: Effort normal and breath sounds normal. No respiratory distress. No wheezes. No rales.  Abdominal: Soft. Bowel sounds are normal. Exhibits no distension and no mass. There is no tenderness.  Musculoskeletal: Normal range of motion. Exhibits no edema.  Lymphadenopathy:    No cervical adenopathy.  Neurological: Alert and oriented to person, place, and time. Exhibits normal muscle tone. Gait normal. Coordination normal.  Skin: Skin is warm and dry. No rash noted. Not diaphoretic. No erythema. No pallor.  Psychiatric: Mood, memory and judgment normal.  Vitals reviewed.  LABORATORY DATA: Lab Results  Component Value Date   WBC 6.0 06/07/2017   HGB 13.0 06/07/2017   HCT 39.3 06/07/2017   MCV 96.3 06/07/2017   PLT 234 06/07/2017      Chemistry      Component Value Date/Time   NA 140 06/07/2017 1437   NA 140 05/22/2017 1212   K 4.4 06/07/2017 1437   K 4.2 05/22/2017 1212   CL 106 06/07/2017 1437   CO2 26 06/07/2017 1437   CO2 21 (L) 05/22/2017 1212   BUN 11 06/07/2017 1437   BUN 10.3 05/22/2017 1212   CREATININE 0.91 06/07/2017 1437   CREATININE 0.8 05/22/2017 1212      Component Value Date/Time    CALCIUM 9.4 06/07/2017 1437   CALCIUM 9.6 05/22/2017 1212   ALKPHOS 125 06/07/2017 1437   ALKPHOS 113 05/22/2017 1212   AST 42 (H) 06/07/2017 1437   AST 17 05/22/2017 1212   ALT 78 (H) 06/07/2017 1437   ALT 27 05/22/2017 1212   BILITOT 0.2 06/07/2017 1437   BILITOT 0.31 05/22/2017 1212       RADIOGRAPHIC STUDIES:  Ct Chest W Contrast  Result Date: 05/18/2017 CLINICAL DATA:  52 year old female complaining of shortness of breath for the past 1.5 months and sternal pain for the past 2 months. Intermittent hemoptysis. History of lung cancer with known metastatic disease to the liver and brain status post  chemotherapy (completed in April 2018) and radiation therapy (completed in May 2018). EXAM: CT CHEST WITH CONTRAST TECHNIQUE: Multidetector CT imaging of the chest was performed during intravenous contrast administration. CONTRAST:  60mL ISOVUE-300 IOPAMIDOL (ISOVUE-300) INJECTION 61% COMPARISON:  75 mL of Isovue-300. FINDINGS: Cardiovascular: Heart size is normal. Increasing areas of pericardial thickening and nodularity. This is most evident just lateral to the left side of the pulmonic trunk (axial image 59 of series 2, and adjacent to the left ventricular apex (axial image 90 of series 2. There is a new nodular area of pericardial thickening on axial image 90 of series 2 measuring 12 x 8 mm. These findings suggest developing pericardial metastasis, presumably from direct invasion from the previously demonstrated left hilar mass. Atherosclerosis in the thoracic aorta. No coronary artery calcifications. Mediastinum/Nodes: Enlarged high right paratracheal lymph node measuring 17 mm in short axis with low-attenuation center. Prominent soft tissue in the left hilar region, without discrete enlarged lymph node. New prominent but nonenlarged lymph node measuring 8 mm in short axis adjacent to the low-attenuation lower esophagus (axial image 97 of series 2). Low left paratracheal lymph node measuring 11  mm in short axis, similar to the prior study. Esophagus is unremarkable in appearance. No axillary lymphadenopathy. 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 However, the areas of nodularity in the inferior segment of the lingula appear increased compared to the prior examination. The largest of these is on axial image 79 of series 7, currently measuring 19 x 14 mm (previously 14 x 10 mm when measured in a similar fashion on exam 01/31/2017. Another nodule currently measures 16 x 10 mm (axial image 73 of series 7), previously 7 x 10 mm when measured in a similar fashion on the prior exam. New nodules are also noted in the inferior segment of the lingula, largest of which measures 7 mm on axial image 73 of series 7. Previously noted subpleural nodule in the posteromedial aspect of the right lower lobe currently measures 12 x 6 mm (previously 6 mm). Other new and enlarging nodules are also noted, including a new 5 mm nodule in the right lower lobe (axial image 135 of series 7), a left lower lobe nodule (image 104 of series 7) which currently measures 5 x 6 mm (previously only 3 mm), and a 4 mm right lower lobe nodule (axial image 98 of series 7) which previously measured only 2 mm. Tiny calcified granuloma in the right upper lobe incidentally noted. No acute consolidative airspace disease. Trace left pleural effusion partially loculated superiorly and lying dependently inferiorly, similar to the prior examination. No right pleural effusion. Upper Abdomen: Several new hypovascular hepatic lesions are noted, highly concerning for new metastatic disease to the liver. The largest of these is in the central aspect of segment 8 (axial image 138 of series 2) measuring 1.9 x 2.7 cm. Subcentimeter low-attenuation lesions in both kidneys are too small to definitively  characterize, but are statistically likely to represent tiny cysts. Aortic atherosclerosis. Musculoskeletal: There are no aggressive appearing lytic or blastic lesions noted in the visualized portions of the skeleton. IMPRESSION: 1. 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 2. Additional incidental findings, as above. Aortic Atherosclerosis (ICD10-I70.0). Electronically Signed  By: Vinnie Langton M.D.   On: 05/18/2017 11:35   Ct Abdomen Pelvis W Contrast  Result Date: 05/17/2017 CLINICAL DATA:  Epigastric pain for several months. History of lung cancer. EXAM: CT ABDOMEN AND PELVIS WITH CONTRAST TECHNIQUE: Multidetector CT imaging of the abdomen and pelvis was performed using the standard protocol following bolus administration of intravenous contrast. CONTRAST:  135mL ISOVUE-300 IOPAMIDOL (ISOVUE-300) INJECTION 61% COMPARISON:  PET-CT 04/04/2016, CT chest 01/31/2017 in the pole of the FINDINGS: Lower chest: Subpleural nodule in the RIGHT lower lobe measures 10 mm (image number 1, series 3) which compares to 6 mm on CT 01/31/2017. Very small subpleural nodule in the LEFT lower lobe anteriorly measures on image 7, series 3 is not changed. Nodule over the hemidiaphragm measuring 5 mm (images 6, series 3) compares with 4 mm. Hepatobiliary: Round 2.3 cm lesion in the central RIGHT hepatic lobe (segment 8, image 19, series 2) is new from comparison exams. Additional small hypodense lesion in subcapsular LEFT hepatic lobe (segment 4A) is also not seen on comparison exams. Pancreas: Pancreas is normal. No ductal dilatation. No pancreatic inflammation. Spleen: Normal spleen Adrenals/urinary tract: Adrenal glands and kidneys are normal. The ureters and bladder normal. Stomach/Bowel: Stomach, small bowel,  appendix, and cecum are normal. Multiple diverticular of the descending colon sigmoid colon. Small fluid collection along the LEFT external iliac vessels measuring 3.4 cm has low simple fluid attenuation and not changed from prior. The RIGHT ovary has a similar pattern to this LEFT lesion in both lesions are felt represent ovaries. Vascular/Lymphatic: Abdominal aorta is normal caliber. There is no retroperitoneal or periportal lymphadenopathy. No pelvic lymphadenopathy. Reproductive: Post hysterectomy anatomy Other: No free fluid. Musculoskeletal: No aggressive osseous lesion IMPRESSION: 1. New central RIGHT hepatic lobe lesion most consistent with hepatic metastasis. 2. Second low-density lesion LEFT hepatic lobe concerning for metastatic lesion 3. Interval increase in size of pleural-based nodules in LEFT and RIGHT lower lobe is concerning for lung cancer recurrence. These results will be called to the ordering clinician or representative by the Radiologist Assistant, and communication documented in the PACS or zVision Dashboard. Electronically Signed   By: Suzy Bouchard M.D.   On: 05/17/2017 16:42   US Biopsy (liver)  Result Date: 05/19/2017 CLINICAL DATA:  History of metastatic adenocarcinoma of the lung with worsening metastatic disease in the liver and lungs. Liver biopsy requested to assess tumor with molecular studies. EXAM: ULTRASOUND GUIDED CORE BIOPSY OF LIVER MEDICATIONS: 4.0 mg IV Versed; 200 mcg IV Fentanyl Total Moderate Sedation Time: 47 minutes. The patient's level of consciousness and physiologic status were continuously monitored during the procedure by Radiology nursing. PROCEDURE: The procedure, risks, benefits, and alternatives were explained to the patient. Questions regarding the procedure were encouraged and answered. The patient understands and consents to the procedure. A time out was performed prior to initiating the procedure. The abdominal wall was prepped with chlorhexidine in  a sterile fashion, and a sterile drape was applied covering the operative field. A sterile gown and sterile gloves were used for the procedure. Local anesthesia was provided with 1% Lidocaine. Ultrasound was performed of the liver. The largest lesion in the central right lobe was chosen for sampling. This was approached initially from a more anterior approach with advancement of a 17 gauge needle. Ultimately, this approach was abandoned and the needle removed. The lesion was then approached from a more lateral approach. Under ultrasound guidance, a 17 gauge needle was advanced to the margin of the lesion. Three separate  coaxial 18 gauge core biopsy samples were obtained and submitted in formalin. A slurry of cut Gel-Foam pledgets were then injected through the outer needle as it was retracted after completion of the procedure. COMPLICATIONS: None. FINDINGS: Central lesion in the right lobe of the liver measures approximately 3 cm in diameter. This is positioned adjacent to the central aspect of the right portal vein as well as central bile ducts. Smaller lesions in the higher left lobe of the liver were visible but due to subcapsular positioning and more superior location, could not be accurately sampled. From an initial anterior approach, a 17 gauge needle was advanced. It became apparent during needle advancement that the entire course of the needle would be difficult to visualize due to shadowing from adjacent ribs and cartilage. Also, along this approach it was felt that there would be higher risk of traversing central portal vein branches and bile ducts. This approach was therefore abandoned. From a lateral approach, a safer window was found which did not traverse major vessels or bile ducts. After a needle was advanced to the edge of the lesion, coaxial core biopsy samples were obtained yielding solid tissue. IMPRESSION: Ultrasound-guided biopsy performed of the central right lobe mass measuring approximately  3 cm. Solid tissue was obtained. Electronically Signed   By: Aletta Edouard M.D.   On: 05/19/2017 17:05   PATHOLOGY:  Diagnosis Liver, needle/core biopsy, right lobe - METASTATIC ADENOCARCINOMA - SEE COMMENT Microscopic Comment The liver core biopsies have an infiltrative proliferation of glands which by immunohistochemistry are positive for cytokeratin 7, Napsin-A and TTF-2 but negative for cytokeratin 20. The morphology and immunophenotype are consistent with metastasis from the patient's known lung adenocarcinoma. Dr. Martinique reviewed the case and agrees with the above diagnosis. Thressa Sheller MD Pathologist, Electronic Signature (Case signed 05/24/2017)  ASSESSMENT/PLAN:  Adenocarcinoma of left lung metastatic to liver [facility]) This is a very pleasant 52 year old white female with unresectable a stage IIIA non-small cell lung cancer, adenocarcinoma status post a course of concurrent chemoradiation with partial response. The patient is currently undergoing consolidation immunotherapy with Imfinzi (Durvalumab) status post 17cycles. She tolerated the last cycle of her treatment well, her, she has increased fatigue and shortness of breath.  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  She is now on systemic chemotherapy with carboplatin for an AUC of 5, Alimta 500 mg meter squared, and Avastin 15 mg/kg given every 3 weeks.  Status post 1 cycle which she tolerated well with the exception of constipation.  The patient was seen with Dr. Julien Nordmann.  Liver biopsy results were discussed which are consistent with metastatic lung adenocarcinoma.  We are awaiting molecular studies.  Pending the results of this, we may consider targeted therapy.  We reviewed the recommendations from [facility] which are consistent with our current plan.  If she does not have any actionable mutations, we will continue on the current chemotherapy regimen.  The patient reported some discomfort with her  IV site with her last chemotherapy.  In order has been placed for Port-A-Cath.  This will need to be done at least 1 week prior or 1 week after cycle of chemotherapy since she is on Avastin.  A call has been placed to interventional radiology, and they will not be able to get the port placed more than 1 week prior to her next cycle of chemotherapy.  We have scheduled this for after her second cycle of chemotherapy.  The patient is okay with  proceeding with her second cycle via a peripheral IV.  EMLA cream has been sent to her local pharmacy.  For pain management, she will continue on Percocet.  A refill was provided today.  For her sinusitis, a prescription for doxycycline 100 mg twice daily times 10 days was sent to her pharmacy.  A refill for Ladona Ridgel was also sent to her local pharmacy.  The patient will follow up with [place] next week for evaluation prior to cycle 2 of chemotherapy and to discuss her molecular studies. She has an MRI of the brain scheduled for tomorrow and she will keep this appointment.  She will follow-up with radiation oncology early next week as already scheduled.  The patient was advised to call immediately if she has any concerning symptoms in the interval. The patient voices understanding of current disease status and treatment options and is in agreement with the current care plan. All questions were answered. The patient knows to call the clinic with any problems, questions or concerns. We can certainly see the patient much sooner if necessary.  Orders Placed This Encounter  Procedures  . IR Fluoro Guide CV Line Right    Standing Status:   Future    Standing Expiration Date:   08/06/2018    Order Specific Question:   Reason for exam:    Answer:   PAC placement for chemo. Pt on Avastin and needs to be done 7 days prior or 7 days after Avastin. Next dose 1/17.    Order Specific Question:   Preferred Imaging Location?    Answer:   [place], [place], AGPCNP-BC, [place] 06/08/17   ADDENDUM: Hematology/Oncology Attending: I had a face-to-face encounter with the patient.  I recommended her care plan.  This is a very pleasant 52 years old white female with history of stage IIIa non-small cell lung cancer, adenocarcinoma status post a course of concurrent chemoradiation with partial response followed by treatment with consolidation immunotherapy status post 17 cycles discontinued secondary to disease progression and development of new liver and brain metastasis. The patient had ultrasound-guided core biopsy of the liver lesion that confirmed the presence of metastatic adenocarcinoma in the liver.  The tissue biopsy was sent to [facility] 1 for molecular studies and the results are still pending.  PDL 1 expression was 0%. She is currently undergoing systemic chemotherapy with carboplatin, Alimta and Avastin status post 1 cycle and she tolerated the first cycle of her treatment well with no concerning complaints. I recommended for the patient to continue her treatment with chemotherapy as a scheduled for now.  We are still waiting for the results of the molecular studies. I will see her back for follow-up visit with the start of cycle #2. She was advised to call immediately if she has any concerning symptoms in the interval. Disclaimer: This note was dictated with voice recognition software. Similar sounding words can inadvertently be transcribed and may be missed upon review. Eilleen Kempf, MD 06/10/17

## 2017-06-07 NOTE — Telephone Encounter (Signed)
Called patient and made her aware of appointment for port a cath placement on Monday June 26, 2017. Patient aware to arrive at [facility] at 12:30 per Tiffany in Interventional Radiology. Patient also aware nothing to eat or drink after 8am.

## 2017-06-08 ENCOUNTER — Ambulatory Visit
Admission: RE | Admit: 2017-06-08 | Discharge: 2017-06-08 | Disposition: A | Discharge status: Home / Self Care | Payer: 59 | Source: Ambulatory Visit | Attending: Radiation Oncology | Admitting: Radiation Oncology

## 2017-06-08 DIAGNOSIS — C3412 Malignant neoplasm of upper lobe, left bronchus or lung: Secondary | ICD-10-CM

## 2017-06-08 DIAGNOSIS — J329 Chronic sinusitis, unspecified: Secondary | ICD-10-CM | POA: Insufficient documentation

## 2017-06-08 MED ORDER — GADOBENATE DIMEGLUMINE 529 MG/ML IV SOLN
19.0000 mL | Freq: Once | INTRAVENOUS | Status: AC | PRN
  Administered 2017-06-08: 19 mL via INTRAVENOUS

## 2017-06-08 NOTE — Assessment & Plan Note (Signed)
This is a very pleasant 52 year old white female with unresectable a stage IIIA non-small cell lung cancer, adenocarcinoma status post a course of concurrent chemoradiation with partial response. The patient is currently undergoing consolidation immunotherapy with Imfinzi (Durvalumab) status post 17cycles. She tolerated the last cycle of her treatment well, her, she has increased fatigue and shortness of breath.  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  She is now on systemic chemotherapy with carboplatin for an AUC of 5, Alimta 500 mg meter squared, and Avastin 15 mg/kg given every 3 weeks.  Status post 1 cycle which she tolerated well with the exception of constipation.  The patient was seen with Dr. Julien Nordmann.  Liver biopsy results were discussed which are consistent with metastatic lung adenocarcinoma.  We are awaiting molecular studies.  Pending the results of this, we may consider targeted therapy.  We reviewed the recommendations from [facility] which are consistent with our current plan.  If she does not have any actionable mutations, we will continue on the current chemotherapy regimen.  The patient reported some discomfort with her IV site with her last chemotherapy.  In order has been placed for Port-A-Cath.  This will need to be done at least 1 week prior or 1 week after cycle of chemotherapy since she is on Avastin.  A call has been placed to interventional radiology, and they will not be able to get the port placed more than 1 week prior to her next cycle of chemotherapy.  We have scheduled this for after her second cycle of chemotherapy.  The patient is okay with proceeding with her second cycle via a peripheral IV.  EMLA cream has been sent to her local pharmacy.  For pain management, she will continue on Percocet.  A refill was provided today.  For her sinusitis, a prescription for doxycycline 100 mg twice daily times 10 days was sent to her pharmacy.  A refill  for Ladona Ridgel was also sent to her local pharmacy.  The patient will follow up with [place] next week for evaluation prior to cycle 2 of chemotherapy and to discuss her molecular studies. She has an MRI of the brain scheduled for tomorrow and she will keep this appointment.  She will follow-up with radiation oncology early next week as already scheduled.  The patient was advised to call immediately if she has any concerning symptoms in the interval. The patient voices understanding of current disease status and treatment options and is in agreement with the current care plan. All questions were answered. The patient knows to call the clinic with any problems, questions or concerns. We can certainly see the patient much sooner if necessary.

## 2017-06-12 ENCOUNTER — Other Ambulatory Visit: Payer: Self-pay

## 2017-06-12 ENCOUNTER — Ambulatory Visit
Admission: RE | Admit: 2017-06-12 | Discharge: 2017-06-12 | Disposition: A | Discharge status: Home / Self Care | Payer: 59 | Source: Ambulatory Visit | Attending: Radiation Oncology | Admitting: Radiation Oncology

## 2017-06-12 ENCOUNTER — Encounter: Payer: Self-pay | Admitting: Radiation Oncology

## 2017-06-12 VITALS — BP 129/76 | HR 96 | Temp 97.9°F | Resp 18 | Wt 199.4 lb

## 2017-06-12 DIAGNOSIS — C7931 Secondary malignant neoplasm of brain: Secondary | ICD-10-CM | POA: Diagnosis not present

## 2017-06-12 DIAGNOSIS — C7951 Secondary malignant neoplasm of bone: Secondary | ICD-10-CM

## 2017-06-12 DIAGNOSIS — C3492 Malignant neoplasm of unspecified part of left bronchus or lung: Secondary | ICD-10-CM

## 2017-06-12 NOTE — Addendum Note (Signed)
Encounter addended by: Tyler Pita, MD on: 06/12/2017 9:24 AM  Actions taken: Problem List modified

## 2017-06-12 NOTE — Progress Notes (Signed)
Radiation Oncology         [phone]   Name: Debbie Baker   Date: 06/12/2017   MRN: [id number]  DOB: 09/11/1965    Multidisciplinary Brain and Spine Oncology Clinic Follow-Up Visit Note  CC: Hulan Fess, MD  Curt Bears, MD    ICD-10-CM   1. Brain metastases ([place]) C79.31     Diagnosis:   52 y.o. female with stage T2a N2 M0 adenocarcinoma of the left upper lung - Stage IIIA with new findings on recent brain MRI suspicious for early metastatic disease vs subacute infarct  Interval Since Last Radiation:  12 months  Radiation treatment dates:   05/02/2016 - 06/17/2016 Site/dose:   The primary tumor and involved mediastinal adenopathy were treated to 66 Gy in 33 fractions of 2 Gy.  Narrative:  The patient returns today for routine follow-up.  The recent films were presented in our multidisciplinary conference with neuroradiology just prior to the clinic.  MRI of the brain with and without contrast on 06/08/2017 showed increased conspicuity on post-infusion imaging, without significant change in size, of the two previous identified cerebellar metastases measuring 4 mm in the left and 3 mm in the right. It was unclear if the more avid enhancement on was due to progression of disease, increased vascularity, or a reflection of the 3 Tesla field strength. Continued surveillance is warranted. The patient is currently being treated with systemic chemotherapy s/p one cycle for metastatic disease to the liver.  On review of systems, the patient reports she is not doing well overall and feels "achy all over". She reports shortness of breath. She denies cough, night sweats, chills, fever, or unintended weight loss. She denies nausea or vomiting. She reports frontal headaches upon waking that improve over the course of the day. She relates frontal headaches to sinus infection she is being treated for with doxycycline. She denies diplopia or tinnitus.                      ALLERGIES:  is allergic  to penicillins; chantix [varenicline]; and levaquin [levofloxacin].  Meds: Current Outpatient Medications  Medication Sig Dispense Refill  . benzonatate (TESSALON) 100 MG capsule Take 1 capsule (100 mg total) by mouth every 4 (four) hours as needed for cough. 60 capsule 1  . Calcium-Magnesium-Vitamin D 185-50-100 MG-MG-UNIT CAPS Take 1 tablet by mouth every evening.    . cetirizine (ZYRTEC) 10 MG tablet Take 10 mg by mouth daily.    Marland Kitchen dexamethasone (DECADRON) 4 MG tablet Take 1 tablet twice a day the day before, day of, and day after each cycle of chemotherapy. 30 tablet 1  . diphenhydrAMINE (BENADRYL) 25 mg capsule Take 25 mg by mouth at bedtime as needed.    . doxycycline (VIBRA-TABS) 100 MG tablet Take 1 tablet (100 mg total) by mouth 2 (two) times daily. 20 tablet 0  . durvalumab (IMFINZI) 120 MG/2.4ML SOLN injection Inject 120 mg into the vein every 14 (fourteen) days.     Marland Kitchen ELDERBERRY PO Take by mouth every morning.    Nyoka Cowden Tea, Camillia sinensis, (GREEN TEA EXTRACT PO) Take by mouth.    . Lactobacillus (PROBIOTIC ACIDOPHILUS) CAPS Take by mouth.    . lidocaine-prilocaine (EMLA) cream Apply 1 application topically as needed. 30 g 2  . Milk Thistle 1000 MG CAPS Take 1 capsule by mouth daily.    . Misc Natural Products (BLACK COHOSH MENOPAUSE COMPLEX) TABS Take 1 capsule by mouth 2 (two) times  daily.    . Misc Natural Products (TURMERIC CURCUMIN) CAPS Take 1 capsule by mouth every evening. 1500 mg capsule    . Multiple Vitamins-Minerals (WOMENS 50+ MULTI VITAMIN/MIN) TABS Take 1 tablet by mouth daily.    Marland Kitchen OVER THE COUNTER MEDICATION Take 250 mg by mouth daily. Jiaogulan Supplement    . oxyCODONE-acetaminophen (PERCOCET/ROXICET) 5-325 MG tablet Take 1 tablet by mouth every 6 (six) hours as needed (Chronic pain management). 60 tablet 0  . pantoprazole (PROTONIX) 40 MG tablet Take 1 tablet (40 mg total) 2 (two) times daily by mouth. 180 tablet 3  . polyethylene glycol (MIRALAX / GLYCOLAX)  packet Take 17 g by mouth daily.    . pravastatin (PRAVACHOL) 20 MG tablet Take 20 mg by mouth every evening.  3  . predniSONE (DELTASONE) 10 MG tablet 6 tab x 1 day, 5 tab x 1 day, 4 tab x 1 day, 3 tab x 1 day, 2 tab x 1 day, 1 tab x 1 day, begin on 12/26 21 tablet 0  . senna (SENOKOT) 8.6 MG tablet Take 1 tablet by mouth daily.    Marland Kitchen albuterol (PROVENTIL HFA;VENTOLIN HFA) 108 (90 Base) MCG/ACT inhaler Inhale 2 puffs into the lungs every 6 (six) hours as needed for wheezing or shortness of breath. (Patient not taking: Reported on 06/12/2017) 1 Inhaler 0  . folic acid (FOLVITE) 1 MG tablet Take 1 tablet (1 mg total) by mouth daily. (Patient not taking: Reported on 06/12/2017) 30 tablet 2  . LORazepam (ATIVAN) 0.5 MG tablet Take 1 tablet (0.5 mg total) by mouth as needed for anxiety (take 30 min prior to MRI scans and treatments). (Patient not taking: Reported on 06/07/2017) 30 tablet 0  . morphine (MSIR) 15 MG tablet Take 1 tablet (15 mg total) by mouth every 4 (four) hours as needed for severe pain. (Patient not taking: Reported on 06/07/2017) 40 tablet 0  . prochlorperazine (COMPAZINE) 10 MG tablet Take 1 tablet (10 mg total) by mouth every 6 (six) hours as needed for nausea or vomiting. (Patient not taking: Reported on 06/07/2017) 30 tablet 0   No current facility-administered medications for this encounter.     Physical Findings: The patient is in no acute distress. Patient is alert and oriented.  weight is 199 lb 6.4 oz (90.4 kg). Her oral temperature is 97.9 F (36.6 C). Her blood pressure is 129/76 and her pulse is 96. Her respiration is 18 and oxygen saturation is 100%. .  No significant changes.  Lab Findings: Lab Results  Component Value Date   WBC 6.0 06/07/2017   HGB 13.0 06/07/2017   HCT 39.3 06/07/2017   MCV 96.3 06/07/2017   PLT 234 06/07/2017    @LASTCHEM @  Radiographic Findings: Ct Chest W Contrast  Result Date: 05/18/2017 CLINICAL DATA:  51 year old female complaining of  shortness of breath for the past 1.5 months and sternal pain for the past 2 months. Intermittent hemoptysis. 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 EXAM: CT CHEST WITH CONTRAST TECHNIQUE: Multidetector CT imaging of the chest was performed during intravenous contrast administration. CONTRAST:  45mL ISOVUE-300 IOPAMIDOL (ISOVUE-300) INJECTION 61% COMPARISON:  75 mL of Isovue-300. FINDINGS: Cardiovascular: Heart size is normal. Increasing areas of pericardial thickening and nodularity. This is most evident just lateral to the left side of the pulmonic trunk (axial image 59 of series 2, and adjacent to the  left ventricular apex (axial image 90 of series 2. There is a new nodular area of pericardial thickening on axial image 90 of series 2 measuring 12 x 8 mm. These findings suggest developing pericardial metastasis, presumably from direct invasion from the previously demonstrated left hilar mass. Atherosclerosis in the thoracic aorta. No coronary artery calcifications. Mediastinum/Nodes: Enlarged high right paratracheal lymph node measuring 17 mm in short axis with low-attenuation center. Prominent soft tissue in the left hilar region, without discrete enlarged lymph node. New prominent but nonenlarged lymph node measuring 8 mm in short axis adjacent to the low-attenuation lower esophagus (axial image 97 of series 2). Low left paratracheal lymph node measuring 11 mm in short axis, similar to the prior study. Esophagus is unremarkable in appearance. No axillary lymphadenopathy. 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 However, the areas of nodularity in the inferior segment of the lingula appear increased compared to the prior  examination. The largest of these is on axial image 79 of series 7, currently measuring 19 x 14 mm (previously 14 x 10 mm when measured in a similar fashion on exam 01/31/2017. Another nodule currently measures 16 x 10 mm (axial image 73 of series 7), previously 7 x 10 mm when measured in a similar fashion on the prior exam. New nodules are also noted in the inferior segment of the lingula, largest of which measures 7 mm on axial image 73 of series 7. Previously noted subpleural nodule in the posteromedial aspect of the right lower lobe currently measures 12 x 6 mm (previously 6 mm). Other new and enlarging nodules are also noted, including a new 5 mm nodule in the right lower lobe (axial image 135 of series 7), a left lower lobe nodule (image 104 of series 7) which currently measures 5 x 6 mm (previously only 3 mm), and a 4 mm right lower lobe nodule (axial image 98 of series 7) which previously measured only 2 mm. Tiny calcified granuloma in the right upper lobe incidentally noted. No acute consolidative airspace disease. Trace left pleural effusion partially loculated superiorly and lying dependently inferiorly, similar to the prior examination. No right pleural effusion. Upper Abdomen: Several new hypovascular hepatic lesions are noted, highly concerning for new metastatic disease to the liver. The largest of these is in the central aspect of segment 8 (axial image 138 of series 2) measuring 1.9 x 2.7 cm. Subcentimeter low-attenuation lesions in both kidneys are too small to definitively characterize, but are statistically likely to represent tiny cysts. Aortic atherosclerosis. Musculoskeletal: There are no aggressive appearing lytic or blastic lesions noted in the visualized portions of the skeleton. IMPRESSION: 1. Today's study demonstrates evidence of progression of disease, with increased in number and size of numerous pulmonary nodules, findings concerning for probable pericardial metastasis, and new  hepatic lesions indicative of metastatic disease to the liver. 2. Additional incidental findings, as above. Aortic Atherosclerosis (ICD10-I70.0). Electronically Signed   By: Vinnie Langton M.D.   On: 05/18/2017 11:35   Mr Jeri Cos IP Contrast  Result Date: 06/08/2017 CLINICAL DATA:  SRS targeting. Confusion and visual problems. Numbness. Unresectable stage III non-small cell lung cancer, liver metastases, on chemotherapy. EXAM: MRI HEAD WITHOUT AND WITH CONTRAST TECHNIQUE: Multiplanar, multiecho pulse sequences of the brain and surrounding structures were obtained without and with intravenous contrast. CONTRAST:  15mL MULTIHANCE GADOBENATE DIMEGLUMINE 529 MG/ML IV SOLN COMPARISON:  MRI brain  performed at [facility] (1.5 T scanner) 05/02/2017. FINDINGS: Brain: LEFT lateral cerebellar metastasis, 4 mm, and RIGHT medial cerebellar metastasis, 3 mm are redemonstrated. They demonstrate increased conspicuity on post infusion imaging, although there is not dramatic increase in size. There is only minor T2 and FLAIR signal associated with both. No new lesions are seen. Premature for age atrophy without significant white matter disease. Vascular: Normal flow voids. Skull and upper cervical spine: Normal marrow signal. Sinuses/Orbits: No sinus fluid or significant opacity. Other: None. IMPRESSION: Increased conspicuity, without significant change in size, on post infusion imaging of the two previous identified cerebellar metastases. It is unclear if the more avid enhancement on today's scan is due to progression of disease/increased vascularity or is a reflection of the 3 Tesla field strength of today's examination. Continued surveillance is warranted. Electronically Signed   By: Staci Righter M.D.   On: 06/08/2017 11:24   Ct Abdomen Pelvis W Contrast  Result Date: 05/17/2017 CLINICAL DATA:  Epigastric pain for several months. History of lung cancer. EXAM: CT ABDOMEN AND PELVIS WITH CONTRAST TECHNIQUE:  Multidetector CT imaging of the abdomen and pelvis was performed using the standard protocol following bolus administration of intravenous contrast. CONTRAST:  141mL ISOVUE-300 IOPAMIDOL (ISOVUE-300) INJECTION 61% COMPARISON:  PET-CT 04/04/2016, CT chest 01/31/2017 in the pole of the FINDINGS: Lower chest: Subpleural nodule in the RIGHT lower lobe measures 10 mm (image number 1, series 3) which compares to 6 mm on CT 01/31/2017. Very small subpleural nodule in the LEFT lower lobe anteriorly measures on image 7, series 3 is not changed. Nodule over the hemidiaphragm measuring 5 mm (images 6, series 3) compares with 4 mm. Hepatobiliary: Round 2.3 cm lesion in the central RIGHT hepatic lobe (segment 8, image 19, series 2) is new from comparison exams. Additional small hypodense lesion in subcapsular LEFT hepatic lobe (segment 4A) is also not seen on comparison exams. Pancreas: Pancreas is normal. No ductal dilatation. No pancreatic inflammation. Spleen: Normal spleen Adrenals/urinary tract: Adrenal glands and kidneys are normal. The ureters and bladder normal. Stomach/Bowel: Stomach, small bowel, appendix, and cecum are normal. Multiple diverticular of the descending colon sigmoid colon. Small fluid collection along the LEFT external iliac vessels measuring 3.4 cm has low simple fluid attenuation and not changed from prior. The RIGHT ovary has a similar pattern to this LEFT lesion in both lesions are felt represent ovaries. Vascular/Lymphatic: Abdominal aorta is normal caliber. There is no retroperitoneal or periportal lymphadenopathy. No pelvic lymphadenopathy. Reproductive: Post hysterectomy anatomy Other: No free fluid. Musculoskeletal: No aggressive osseous lesion IMPRESSION: 1. New central RIGHT hepatic lobe lesion most consistent with hepatic metastasis. 2. Second low-density lesion LEFT hepatic lobe concerning for metastatic lesion 3. Interval increase in size of pleural-based nodules in LEFT and RIGHT lower  lobe is concerning for lung cancer recurrence. These results will be called to the ordering clinician or representative by the Radiologist Assistant, and communication documented in the PACS or zVision Dashboard. Electronically Signed   By: Suzy Bouchard M.D.   On: 05/17/2017 16:42   US Biopsy (liver)  Result Date: 05/19/2017 CLINICAL DATA:  History of metastatic adenocarcinoma of the lung with worsening metastatic disease in the liver and lungs. Liver biopsy requested to assess tumor with molecular studies. EXAM: ULTRASOUND GUIDED CORE BIOPSY OF LIVER MEDICATIONS: 4.0 mg IV Versed; 200 mcg IV Fentanyl Total Moderate Sedation Time: 47 minutes. The patient's level of consciousness and physiologic status were continuously monitored during the procedure by Radiology nursing. PROCEDURE:  The procedure, risks, benefits, and alternatives were explained to the patient. Questions regarding the procedure were encouraged and answered. The patient understands and consents to the procedure. A time out was performed prior to initiating the procedure. The abdominal wall was prepped with chlorhexidine in a sterile fashion, and a sterile drape was applied covering the operative field. A sterile gown and sterile gloves were used for the procedure. Local anesthesia was provided with 1% Lidocaine. Ultrasound was performed of the liver. The largest lesion in the central right lobe was chosen for sampling. This was approached initially from a more anterior approach with advancement of a 17 gauge needle. Ultimately, this approach was abandoned and the needle removed. The lesion was then approached from a more lateral approach. Under ultrasound guidance, a 17 gauge needle was advanced to the margin of the lesion. Three separate coaxial 18 gauge core biopsy samples were obtained and submitted in formalin. A slurry of cut Gel-Foam pledgets were then injected through the outer needle as it was retracted after completion of the  procedure. COMPLICATIONS: None. FINDINGS: Central lesion in the right lobe of the liver measures approximately 3 cm in diameter. This is positioned adjacent to the central aspect of the right portal vein as well as central bile ducts. Smaller lesions in the higher left lobe of the liver were visible but due to subcapsular positioning and more superior location, could not be accurately sampled. From an initial anterior approach, a 17 gauge needle was advanced. It became apparent during needle advancement that the entire course of the needle would be difficult to visualize due to shadowing from adjacent ribs and cartilage. Also, along this approach it was felt that there would be higher risk of traversing central portal vein branches and bile ducts. This approach was therefore abandoned. From a lateral approach, a safer window was found which did not traverse major vessels or bile ducts. After a needle was advanced to the edge of the lesion, coaxial core biopsy samples were obtained yielding solid tissue. IMPRESSION: Ultrasound-guided biopsy performed of the central right lobe mass measuring approximately 3 cm. Solid tissue was obtained. Electronically Signed   By: Aletta Edouard M.D.   On: 05/19/2017 17:05    Impression:  SHANIGUA GIBB is a 52 y.o. woman with two subcentimeter small cerebellar lesions which appear consistent with metastases. Given their stability with systemic therapy, the plan is to observe them rather than proceed with intervention. repeat brain mri in two months then return to review the results.  Plan:  The patient will repeat Brain MRI in two months then return to review the results.  I spent time minutes face to face with the patient and more than 50% of that time was spent in counseling and/or coordination of care.  _____________________________________  Sheral Apley. Tammi Klippel, M.D.  This document serves as a record of services personally performed by Tyler Pita, MD. It was  created on his behalf by Rae Lips, a trained medical scribe. The creation of this record is based on the scribe's personal observations and the provider's statements to them. This document has been checked and approved by the attending provider.

## 2017-06-12 NOTE — Progress Notes (Addendum)
Weight and vitals stable. Patient reports feeling "crappy all over." Patient believes she feels achy because "the cancer is winning...its in the liver." Flat affect noted today. Patient buried her mother in December. Today is her mother's birthday. Tomorrow is the patient's birthday. Reports shortness of breath. Denies cough, night sweats, chills, fever, or unintended weight loss. Denies nausea or vomiting. Reports frontal headaches upon waking that improve over the course of the day. Patient relates frontal headaches to sinus infection she is being treated for. Denies diplopia or tinnitus. Steady gait noted. Reports taking decadron the day before, day of, and day after chemotherapy. No thrush noted. Denies weakness, numbness, or tingling of extremities.   BP 129/76 (BP Location: Right Arm, Patient Position: Sitting, Cuff Size: Normal)   Pulse 96   Temp 97.9 F (36.6 C) (Oral)   Resp 18   Wt 199 lb 6.4 oz (90.4 kg)   SpO2 100%   BMI 30.77 kg/m  Wt Readings from Last 3 Encounters:  06/12/17 199 lb 6.4 oz (90.4 kg)  06/07/17 199 lb 12.8 oz (90.6 kg)  05/18/17 199 lb 8 oz (90.5 kg)

## 2017-06-13 ENCOUNTER — Ambulatory Visit: Payer: Self-pay | Admitting: Urology

## 2017-06-15 ENCOUNTER — Inpatient Hospital Stay (HOSPITAL_BASED_OUTPATIENT_CLINIC_OR_DEPARTMENT_OTHER): Payer: No Typology Code available for payment source | Admitting: Internal Medicine

## 2017-06-15 ENCOUNTER — Inpatient Hospital Stay: Payer: No Typology Code available for payment source

## 2017-06-15 ENCOUNTER — Encounter: Payer: Self-pay | Admitting: Medical Oncology

## 2017-06-15 ENCOUNTER — Encounter: Payer: Self-pay | Admitting: Internal Medicine

## 2017-06-15 ENCOUNTER — Encounter (HOSPITAL_COMMUNITY): Payer: Self-pay

## 2017-06-15 VITALS — BP 142/83 | HR 103 | Temp 98.7°F | Resp 18 | Ht 67.5 in | Wt 196.2 lb

## 2017-06-15 VITALS — BP 113/65 | HR 98

## 2017-06-15 DIAGNOSIS — C787 Secondary malignant neoplasm of liver and intrahepatic bile duct: Secondary | ICD-10-CM

## 2017-06-15 DIAGNOSIS — K219 Gastro-esophageal reflux disease without esophagitis: Secondary | ICD-10-CM | POA: Diagnosis not present

## 2017-06-15 DIAGNOSIS — Z880 Allergy status to penicillin: Secondary | ICD-10-CM

## 2017-06-15 DIAGNOSIS — C3412 Malignant neoplasm of upper lobe, left bronchus or lung: Secondary | ICD-10-CM

## 2017-06-15 DIAGNOSIS — E785 Hyperlipidemia, unspecified: Secondary | ICD-10-CM

## 2017-06-15 DIAGNOSIS — R05 Cough: Secondary | ICD-10-CM

## 2017-06-15 DIAGNOSIS — Z5111 Encounter for antineoplastic chemotherapy: Secondary | ICD-10-CM | POA: Diagnosis not present

## 2017-06-15 DIAGNOSIS — R5383 Other fatigue: Secondary | ICD-10-CM

## 2017-06-15 DIAGNOSIS — C3492 Malignant neoplasm of unspecified part of left bronchus or lung: Secondary | ICD-10-CM

## 2017-06-15 DIAGNOSIS — Z9221 Personal history of antineoplastic chemotherapy: Secondary | ICD-10-CM | POA: Diagnosis not present

## 2017-06-15 DIAGNOSIS — Z7952 Long term (current) use of systemic steroids: Secondary | ICD-10-CM

## 2017-06-15 DIAGNOSIS — R590 Localized enlarged lymph nodes: Secondary | ICD-10-CM

## 2017-06-15 DIAGNOSIS — R0789 Other chest pain: Secondary | ICD-10-CM | POA: Diagnosis not present

## 2017-06-15 DIAGNOSIS — Z923 Personal history of irradiation: Secondary | ICD-10-CM | POA: Diagnosis not present

## 2017-06-15 DIAGNOSIS — C7931 Secondary malignant neoplasm of brain: Secondary | ICD-10-CM | POA: Diagnosis not present

## 2017-06-15 DIAGNOSIS — Z79899 Other long term (current) drug therapy: Secondary | ICD-10-CM

## 2017-06-15 DIAGNOSIS — Z9225 Personal history of immunosupression therapy: Secondary | ICD-10-CM

## 2017-06-15 LAB — COMPREHENSIVE METABOLIC PANEL
ALK PHOS: 114 U/L (ref 40–150)
ALT: 61 U/L — AB (ref 0–55)
AST: 27 U/L (ref 5–34)
Albumin: 4.1 g/dL (ref 3.5–5.0)
Anion gap: 12 — ABNORMAL HIGH (ref 3–11)
BUN: 10 mg/dL (ref 7–26)
CHLORIDE: 105 mmol/L (ref 98–109)
CO2: 22 mmol/L (ref 22–29)
CREATININE: 0.8 mg/dL (ref 0.60–1.10)
Calcium: 9.9 mg/dL (ref 8.4–10.4)
GFR calc Af Amer: >60 mL/min (ref >60)
Glucose, Bld: 140 mg/dL (ref 70–140)
Potassium: 4 mmol/L (ref 3.3–4.7)
SODIUM: 139 mmol/L (ref 136–145)
Total Bilirubin: 0.4 mg/dL (ref 0.2–1.2)
Total Protein: 7.9 g/dL (ref 6.4–8.3)

## 2017-06-15 LAB — CBC WITH DIFFERENTIAL/PLATELET
Basophils Absolute: 0 10*3/uL (ref 0.0–0.1)
Basophils Relative: 0 %
EOS PCT: 0 %
Eosinophils Absolute: 0 10*3/uL (ref 0.0–0.5)
HCT: 40.3 % (ref 34.8–46.6)
HEMOGLOBIN: 13.8 g/dL (ref 11.6–15.9)
LYMPHS PCT: 11 %
Lymphs Abs: 1.1 10*3/uL (ref 0.9–3.3)
MCH: 32.1 pg (ref 25.1–34.0)
MCHC: 34.1 g/dL (ref 31.5–36.0)
MCV: 94 fL (ref 79.5–101.0)
Monocytes Absolute: 0.5 10*3/uL (ref 0.1–0.9)
Monocytes Relative: 5 %
NEUTROS ABS: 8.6 10*3/uL — AB (ref 1.5–6.5)
NEUTROS PCT: 84 %
PLATELETS: 380 10*3/uL (ref 145–400)
RBC: 4.29 MIL/uL (ref 3.70–5.45)
RDW: 12.7 % (ref 11.2–16.1)
WBC: 10.2 10*3/uL (ref 3.9–10.3)

## 2017-06-15 LAB — UA PROTEIN, DIPSTICK - CHCC: Protein, ur: NEGATIVE mg/dL

## 2017-06-15 MED ORDER — SODIUM CHLORIDE 0.9 % IV SOLN
Freq: Once | INTRAVENOUS | Status: AC
  Administered 2017-06-15: 11:00:00 via INTRAVENOUS
  Filled 2017-06-15: qty 5

## 2017-06-15 MED ORDER — SODIUM CHLORIDE 0.9 % IV SOLN
1000.0000 mg | Freq: Once | INTRAVENOUS | Status: AC
  Administered 2017-06-15: 1000 mg via INTRAVENOUS
  Filled 2017-06-15: qty 40

## 2017-06-15 MED ORDER — PALONOSETRON HCL INJECTION 0.25 MG/5ML
INTRAVENOUS | Status: AC
  Filled 2017-06-15: qty 5

## 2017-06-15 MED ORDER — SODIUM CHLORIDE 0.9 % IV SOLN
710.0000 mg | Freq: Once | INTRAVENOUS | Status: AC
  Administered 2017-06-15: 710 mg via INTRAVENOUS
  Filled 2017-06-15: qty 71

## 2017-06-15 MED ORDER — SODIUM CHLORIDE 0.9 % IV SOLN
15.4000 mg/kg | Freq: Once | INTRAVENOUS | Status: AC
  Administered 2017-06-15: 1400 mg via INTRAVENOUS
  Filled 2017-06-15: qty 48

## 2017-06-15 MED ORDER — PALONOSETRON HCL INJECTION 0.25 MG/5ML
0.2500 mg | Freq: Once | INTRAVENOUS | Status: AC
  Administered 2017-06-15: 0.25 mg via INTRAVENOUS

## 2017-06-15 MED ORDER — OXYCODONE-ACETAMINOPHEN 5-325 MG PO TABS: 1.0000 | ORAL_TABLET | ORAL | 0 refills | Status: DC | PRN

## 2017-06-15 MED ORDER — SODIUM CHLORIDE 0.9 % IV SOLN
Freq: Once | INTRAVENOUS | Status: AC
  Administered 2017-06-15: 11:00:00 via INTRAVENOUS

## 2017-06-15 NOTE — Patient Instructions (Signed)
[facility] Discharge Instructions for Patients Receiving Chemotherapy  Today you received the following chemotherapy agents:  Avastin (bevasizumab), Alimta (pemetrexed), Carboplatin (paraplatin)  To help prevent nausea and vomiting after your treatment, we encourage you to take your nausea medication as prescribed.   If you develop nausea and vomiting that is not controlled by your nausea medication, call the clinic.   BELOW ARE SYMPTOMS THAT SHOULD BE REPORTED IMMEDIATELY:  *FEVER GREATER THAN 100.5 F  *CHILLS WITH OR WITHOUT FEVER  NAUSEA AND VOMITING THAT IS NOT CONTROLLED WITH YOUR NAUSEA MEDICATION  *UNUSUAL SHORTNESS OF BREATH  *UNUSUAL BRUISING OR BLEEDING  TENDERNESS IN MOUTH AND THROAT WITH OR WITHOUT PRESENCE OF ULCERS  *URINARY PROBLEMS  *BOWEL PROBLEMS  UNUSUAL RASH Items with * indicate a potential emergency and should be followed up as soon as possible.  Feel free to call the clinic should you have any questions or concerns. The clinic phone number is [phone].  Please show the [facility] at check-in to the Emergency Department and triage nurse.    Carboplatin injection What is this medicine? CARBOPLATIN (KAR boe pla tin) is a chemotherapy drug. It targets fast dividing cells, like cancer cells, and causes these cells to die. This medicine is used to treat ovarian cancer and many other cancers. This medicine may be used for other purposes; ask your health care provider or pharmacist if you have questions. COMMON BRAND NAME(S): Paraplatin What should I tell my health care provider before I take this medicine? They need to know if you have any of these conditions: -blood disorders -hearing problems -kidney disease -recent or ongoing radiation therapy -an unusual or allergic reaction to carboplatin, cisplatin, other chemotherapy, other medicines, foods, dyes, or preservatives -pregnant or trying to get  pregnant -breast-feeding How should I use this medicine? This drug is usually given as an infusion into a vein. It is administered in a hospital or clinic by a specially trained health care professional. Talk to your pediatrician regarding the use of this medicine in children. Special care may be needed. Overdosage: If you think you have taken too much of this medicine contact a poison control center or emergency room at once. NOTE: This medicine is only for you. Do not share this medicine with others. What if I miss a dose? It is important not to miss a dose. Call your doctor or health care professional if you are unable to keep an appointment. What may interact with this medicine? -medicines for seizures -medicines to increase blood counts like filgrastim, pegfilgrastim, sargramostim -some antibiotics like amikacin, gentamicin, neomycin, streptomycin, tobramycin -vaccines Talk to your doctor or health care professional before taking any of these medicines: -acetaminophen -aspirin -ibuprofen -ketoprofen -naproxen This list may not describe all possible interactions. Give your health care provider a list of all the medicines, herbs, non-prescription drugs, or dietary supplements you use. Also tell them if you smoke, drink alcohol, or use illegal drugs. Some items may interact with your medicine. What should I watch for while using this medicine? Your condition will be monitored carefully while you are receiving this medicine. You will need important blood work done while you are taking this medicine. This drug may make you feel generally unwell. This is not uncommon, as chemotherapy can affect healthy cells as well as cancer cells. Report any side effects. Continue your course of treatment even though you feel ill unless your doctor tells you to stop. In some cases, you may be given additional  medicines to help with side effects. Follow all directions for their use. Call your doctor or  health care professional for advice if you get a fever, chills or sore throat, or other symptoms of a cold or flu. Do not treat yourself. This drug decreases your body's ability to fight infections. Try to avoid being around people who are sick. This medicine may increase your risk to bruise or bleed. Call your doctor or health care professional if you notice any unusual bleeding. Be careful brushing and flossing your teeth or using a toothpick because you may get an infection or bleed more easily. If you have any dental work done, tell your dentist you are receiving this medicine. Avoid taking products that contain aspirin, acetaminophen, ibuprofen, naproxen, or ketoprofen unless instructed by your doctor. These medicines may hide a fever. Do not become pregnant while taking this medicine. Women should inform their doctor if they wish to become pregnant or think they might be pregnant. There is a potential for serious side effects to an unborn child. Talk to your health care professional or pharmacist for more information. Do not breast-feed an infant while taking this medicine. What side effects may I notice from receiving this medicine? Side effects that you should report to your doctor or health care professional as soon as possible: -allergic reactions like skin rash, itching or hives, swelling of the face, lips, or tongue -signs of infection - fever or chills, cough, sore throat, pain or difficulty passing urine -signs of decreased platelets or bleeding - bruising, pinpoint red spots on the skin, black, tarry stools, nosebleeds -signs of decreased red blood cells - unusually weak or tired, fainting spells, lightheadedness -breathing problems -changes in hearing -changes in vision -chest pain -high blood pressure -low blood counts - This drug may decrease the number of white blood cells, red blood cells and platelets. You may be at increased risk for infections and bleeding. -nausea and  vomiting -pain, swelling, redness or irritation at the injection site -pain, tingling, numbness in the hands or feet -problems with balance, talking, walking -trouble passing urine or change in the amount of urine Side effects that usually do not require medical attention (report to your doctor or health care professional if they continue or are bothersome): -hair loss -loss of appetite -metallic taste in the mouth or changes in taste This list may not describe all possible side effects. Call your doctor for medical advice about side effects. You may report side effects to FDA at 1-800-FDA-1088. Where should I keep my medicine? This drug is given in a hospital or clinic and will not be stored at home. NOTE: This sheet is a summary. It may not cover all possible information. If you have questions about this medicine, talk to your doctor, pharmacist, or health care provider.  2018 [employer]/Gold Standard (2007-08-21 14:38:05)   Bevacizumab injection What is this medicine? BEVACIZUMAB (be va SIZ yoo mab) is a monoclonal antibody. It is used to treat many types of cancer. This medicine may be used for other purposes; ask your health care provider or pharmacist if you have questions. COMMON BRAND NAME(S): Avastin What should I tell my health care provider before I take this medicine? They need to know if you have any of these conditions: -diabetes -heart disease -high blood pressure -history of coughing up blood -prior anthracycline chemotherapy (e.g., doxorubicin, daunorubicin, epirubicin) -recent or ongoing radiation therapy -recent or planning to have surgery -stroke -an unusual or allergic reaction to bevacizumab, hamster proteins,  mouse proteins, other medicines, foods, dyes, or preservatives -pregnant or trying to get pregnant -breast-feeding How should I use this medicine? This medicine is for infusion into a vein. It is given by a health care professional in a hospital or clinic  setting. Talk to your pediatrician regarding the use of this medicine in children. Special care may be needed. Overdosage: If you think you have taken too much of this medicine contact a poison control center or emergency room at once. NOTE: This medicine is only for you. Do not share this medicine with others. What if I miss a dose? It is important not to miss your dose. Call your doctor or health care professional if you are unable to keep an appointment. What may interact with this medicine? Interactions are not expected. This list may not describe all possible interactions. Give your health care provider a list of all the medicines, herbs, non-prescription drugs, or dietary supplements you use. Also tell them if you smoke, drink alcohol, or use illegal drugs. Some items may interact with your medicine. What should I watch for while using this medicine? Your condition will be monitored carefully while you are receiving this medicine. You will need important blood work and urine testing done while you are taking this medicine. This medicine may increase your risk to bruise or bleed. Call your doctor or health care professional if you notice any unusual bleeding. This medicine should be started at least 28 days following major surgery and the site of the surgery should be totally healed. Check with your doctor before scheduling dental work or surgery while you are receiving this treatment. Talk to your doctor if you have recently had surgery or if you have a wound that has not healed. Do not become pregnant while taking this medicine or for 6 months after stopping it. Women should inform their doctor if they wish to become pregnant or think they might be pregnant. There is a potential for serious side effects to an unborn child. Talk to your health care professional or pharmacist for more information. Do not breast-feed an infant while taking this medicine and for 6 months after the last dose. This  medicine has caused ovarian failure in some women. This medicine may interfere with the ability to have a child. You should talk to your doctor or health care professional if you are concerned about your fertility. What side effects may I notice from receiving this medicine? Side effects that you should report to your doctor or health care professional as soon as possible: -allergic reactions like skin rash, itching or hives, swelling of the face, lips, or tongue -chest pain or chest tightness -chills -coughing up blood -high fever -seizures -severe constipation -signs and symptoms of bleeding such as bloody or black, tarry stools; red or dark-brown urine; spitting up blood or brown material that looks like coffee grounds; red spots on the skin; unusual bruising or bleeding from the eye, gums, or nose -signs and symptoms of a blood clot such as breathing problems; chest pain; severe, sudden headache; pain, swelling, warmth in the leg -signs and symptoms of a stroke like changes in vision; confusion; trouble speaking or understanding; severe headaches; sudden numbness or weakness of the face, arm or leg; trouble walking; dizziness; loss of balance or coordination -stomach pain -sweating -swelling of legs or ankles -vomiting -weight gain Side effects that usually do not require medical attention (report to your doctor or health care professional if they continue or are bothersome): -back  pain -changes in taste -decreased appetite -dry skin -nausea -tiredness This list may not describe all possible side effects. Call your doctor for medical advice about side effects. You may report side effects to FDA at 1-800-FDA-1088. Where should I keep my medicine? This drug is given in a hospital or clinic and will not be stored at home. NOTE: This sheet is a summary. It may not cover all possible information. If you have questions about this medicine, talk to your doctor, pharmacist, or health care  provider.  2018 Elsevier/Gold Standard (2016-05-13 14:33:29)   Pemetrexed injection What is this medicine? PEMETREXED (PEM e TREX ed) is a chemotherapy drug used to treat lung cancers like non-small cell lung cancer and mesothelioma. It may also be used to treat other cancers. This medicine may be used for other purposes; ask your health care provider or pharmacist if you have questions. COMMON BRAND NAME(S): Alimta What should I tell my health care provider before I take this medicine? They need to know if you have any of these conditions: -infection (especially a virus infection such as chickenpox, cold sores, or herpes) -kidney disease -low blood counts, like low white cell, platelet, or red cell counts -lung or breathing disease, like asthma -radiation therapy -an unusual or allergic reaction to pemetrexed, other medicines, foods, dyes, or preservative -pregnant or trying to get pregnant -breast-feeding How should I use this medicine? This drug is given as an infusion into a vein. It is administered in a hospital or clinic by a specially trained health care professional. Talk to your pediatrician regarding the use of this medicine in children. Special care may be needed. Overdosage: If you think you have taken too much of this medicine contact a poison control center or emergency room at once. NOTE: This medicine is only for you. Do not share this medicine with others. What if I miss a dose? It is important not to miss your dose. Call your doctor or health care professional if you are unable to keep an appointment. What may interact with this medicine? This medicine may interact with the following medications: -Ibuprofen This list may not describe all possible interactions. Give your health care provider a list of all the medicines, herbs, non-prescription drugs, or dietary supplements you use. Also tell them if you smoke, drink alcohol, or use illegal drugs. Some items may interact  with your medicine. What should I watch for while using this medicine? Visit your doctor for checks on your progress. This drug may make you feel generally unwell. This is not uncommon, as chemotherapy can affect healthy cells as well as cancer cells. Report any side effects. Continue your course of treatment even though you feel ill unless your doctor tells you to stop. In some cases, you may be given additional medicines to help with side effects. Follow all directions for their use. Call your doctor or health care professional for advice if you get a fever, chills or sore throat, or other symptoms of a cold or flu. Do not treat yourself. This drug decreases your body's ability to fight infections. Try to avoid being around people who are sick. This medicine may increase your risk to bruise or bleed. Call your doctor or health care professional if you notice any unusual bleeding. Be careful brushing and flossing your teeth or using a toothpick because you may get an infection or bleed more easily. If you have any dental work done, tell your dentist you are receiving this medicine. Avoid taking products  that contain aspirin, acetaminophen, ibuprofen, naproxen, or ketoprofen unless instructed by your doctor. These medicines may hide a fever. Call your doctor or health care professional if you get diarrhea or mouth sores. Do not treat yourself. To protect your kidneys, drink water or other fluids as directed while you are taking this medicine. Do not become pregnant while taking this medicine or for 6 months after stopping it. Women should inform their doctor if they wish to become pregnant or think they might be pregnant. Men should not father a child while taking this medicine and for 3 months after stopping it. This may interfere with the ability to father a child. You should talk to your doctor or health care professional if you are concerned about your fertility. There is a potential for serious side  effects to an unborn child. Talk to your health care professional or pharmacist for more information. Do not breast-feed an infant while taking this medicine or for 1 week after stopping it. What side effects may I notice from receiving this medicine? Side effects that you should report to your doctor or health care professional as soon as possible: -allergic reactions like skin rash, itching or hives, swelling of the face, lips, or tongue -breathing problems -redness, blistering, peeling or loosening of the skin, including inside the mouth -signs and symptoms of bleeding such as bloody or black, tarry stools; red or dark-brown urine; spitting up blood or brown material that looks like coffee grounds; red spots on the skin; unusual bruising or bleeding from the eye, gums, or nose -signs and symptoms of infection like fever or chills; cough; sore throat; pain or trouble passing urine -signs and symptoms of kidney injury like trouble passing urine or change in the amount of urine -signs and symptoms of liver injury like dark yellow or brown urine; general ill feeling or flu-like symptoms; light-colored stools; loss of appetite; nausea; right upper belly pain; unusually weak or tired; yellowing of the eyes or skin Side effects that usually do not require medical attention (report to your doctor or health care professional if they continue or are bothersome): -constipation -dizziness -mouth sores -nausea, vomiting -pain, tingling, numbness in the hands or feet -unusually weak or tired This list may not describe all possible side effects. Call your doctor for medical advice about side effects. You may report side effects to FDA at 1-800-FDA-1088. Where should I keep my medicine? This drug is given in a hospital or clinic and will not be stored at home. NOTE: This sheet is a summary. It may not cover all possible information. If you have questions about this medicine, talk to your doctor, pharmacist,  or health care provider.  2018 [employer]/Gold Standard (2016-03-15 18:51:46)

## 2017-06-15 NOTE — Progress Notes (Signed)
[place] [phone]   [phone]  OFFICE PROGRESS NOTE  Hulan Fess, MD [place] [address]  DIAGNOSIS: Stage IIIA (T2a, N2, M0) non-small cell lung cancer, adenocarcinoma presented with left suprahilar mass, mediastinal lymphadenopathy and suspicious pulmonary nodule in the left upper lobe diagnosed in November 2017. No actionable mutations on [employer] testing.  Biomarker Findings Microsatellite Status - MS-Stable Tumor Mutational Burden - TMB-Low (3 Muts/Mb) Genomic Findings For a complete list of the genes assayed, please refer to the Appendix. STK11 loss exons 1 AXL amplification - equivocal? HGF amplification - equivocal? KRAS L19F RICTOR amplification CHEK2 T356f*15 FGF10 amplification TP53 Q136P 7 Disease relevant genes with no reportable alterations: RET, ROS1, ALK, BRAF, ERBB2, MET, EGFR  PDL1 Expression 0%.  PRIOR THERAPY:  1) Status post concurrent chemoradiation with weekly carboplatin for AUC of 2 and paclitaxel 45 MG/M2 for 7 cycles. Last dose was given 06/13/2016. 2) Immunotherapy with Imfinzi (Durvalumab) 10 MG/KG every 2 weeks. First dose 08/11/2016. Status post 18 cycles.  Discontinued secondary to disease progression.   CURRENT THERAPY: Systemic chemotherapy with carboplatin for AUC of 5, Alimta 500 mg/M2 and Avastin 15 mg/KG every 3 weeks status post 1 cycle.  INTERVAL HISTORY: Debbie SUNDBERG52y.o. female returns to the clinic today for follow-up visit.  The patient is feeling fine today with no specific complaints except for mild cough associated with pain on the left side of the chest.  She is currently on treatment with MS Contin and Percocet.  She is requesting refill of Percocet today.  She denied having any shortness of breath or hemoptysis.  She denied having any recent weight loss or night sweats.  She has no nausea, vomiting, diarrhea or constipation.  She was a started recently  on treatment with carboplatin, Alimta and Avastin status post 1 cycle and she tolerated the first cycle of her treatment well.  She had molecular studies performed by [facility] and she is here today for discussion of these results and treatment options.   MEDICAL HISTORY: Past Medical History:  Diagnosis Date  . Allergic rhinitis   . Deaf, left   . Diverticulosis    sigmoid colon  . Dyspnea   . Encounter for antineoplastic chemotherapy 04/18/2016  . Encounter for antineoplastic immunotherapy 07/21/2016  . GERD (gastroesophageal reflux disease)   . Hyperlipidemia   . [facility] [place] [place]   non small cell lung cancer favoring adenocarcinoma   . Lung nodules   . Migraine   . Odynophagia 05/24/2016  . Pneumonia   . PONV (postoperative nausea and vomiting)   . [place] [place]  . Wears glasses     ALLERGIES:  is allergic to penicillins; chantix [varenicline]; and levaquin [levofloxacin].  MEDICATIONS:  Current Outpatient Medications  Medication Sig Dispense Refill  . albuterol (PROVENTIL HFA;VENTOLIN HFA) 108 (90 Base) MCG/ACT inhaler Inhale 2 puffs into the lungs every 6 (six) hours as needed for wheezing or shortness of breath. 1 Inhaler 0  . benzonatate (TESSALON) 100 MG capsule Take 1 capsule (100 mg total) by mouth every 4 (four) hours as needed for cough. 60 capsule 1  . Calcium-Magnesium-Vitamin D 185-50-100 MG-MG-UNIT CAPS Take 1 tablet by mouth every evening.    . cetirizine (ZYRTEC) 10 MG tablet Take 10 mg by mouth daily.    .Marland Kitchendexamethasone (DECADRON) 4 MG tablet Take 1 tablet twice a day the day before, day of, and day after  each cycle of chemotherapy. 30 tablet 1  . diphenhydrAMINE (BENADRYL) 25 mg capsule Take 25 mg by mouth at bedtime as needed.    . doxycycline (VIBRA-TABS) 100 MG tablet Take 1 tablet (100 mg total) by mouth 2 (two) times daily. 20 tablet 0  . ELDERBERRY PO Take by mouth every morning.    . folic acid (FOLVITE) 1 MG  tablet Take 1 tablet (1 mg total) by mouth daily. 30 tablet 2  . Green Tea, Camillia sinensis, (GREEN TEA EXTRACT PO) Take by mouth.    . Lactobacillus (PROBIOTIC ACIDOPHILUS) CAPS Take by mouth.    . lidocaine-prilocaine (EMLA) cream Apply 1 application topically as needed. 30 g 2  . Milk Thistle 1000 MG CAPS Take 1 capsule by mouth daily.    . Misc Natural Products (BLACK COHOSH MENOPAUSE COMPLEX) TABS Take 1 capsule by mouth 2 (two) times daily.    . Misc Natural Products (TURMERIC CURCUMIN) CAPS Take 1 capsule by mouth every evening. 1500 mg capsule    . morphine (MSIR) 15 MG tablet Take 1 tablet (15 mg total) by mouth every 4 (four) hours as needed for severe pain. 40 tablet 0  . Multiple Vitamins-Minerals (WOMENS 50+ MULTI VITAMIN/MIN) TABS Take 1 tablet by mouth daily.    Marland Kitchen OVER THE COUNTER MEDICATION Take 250 mg by mouth daily. Jiaogulan Supplement    . oxyCODONE-acetaminophen (PERCOCET/ROXICET) 5-325 MG tablet Take 1 tablet by mouth every 6 (six) hours as needed (Chronic pain management). 60 tablet 0  . pantoprazole (PROTONIX) 40 MG tablet Take 1 tablet (40 mg total) 2 (two) times daily by mouth. 180 tablet 3  . polyethylene glycol (MIRALAX / GLYCOLAX) packet Take 17 g by mouth daily.    . pravastatin (PRAVACHOL) 20 MG tablet Take 20 mg by mouth every evening.  3  . prochlorperazine (COMPAZINE) 10 MG tablet Take 1 tablet (10 mg total) by mouth every 6 (six) hours as needed for nausea or vomiting. 30 tablet 0  . senna (SENOKOT) 8.6 MG tablet Take 1 tablet by mouth daily.    Marland Kitchen durvalumab (IMFINZI) 120 MG/2.4ML SOLN injection Inject 120 mg into the vein every 14 (fourteen) days.     Marland Kitchen LORazepam (ATIVAN) 0.5 MG tablet Take 1 tablet (0.5 mg total) by mouth as needed for anxiety (take 30 min prior to MRI scans and treatments). (Patient not taking: Reported on 06/07/2017) 30 tablet 0   No current facility-administered medications for this visit.     SURGICAL HISTORY:  Past Surgical History:    Procedure Laterality Date  . ABDOMINAL HYSTERECTOMY    . VIDEO BRONCHOSCOPY Bilateral 07/03/2015   Procedure: VIDEO BRONCHOSCOPY WITH FLUORO;  Surgeon: Marshell Garfinkel, MD;  Location: [facility];  Service: Cardiopulmonary;  Laterality: Bilateral;  . VIDEO BRONCHOSCOPY WITH ENDOBRONCHIAL ULTRASOUND N/A 04/06/2016   Procedure: VIDEO BRONCHOSCOPY WITH ENDOBRONCHIAL ULTRASOUND;  Surgeon: Collene Gobble, MD;  Location: [facility];  Service: Thoracic;  Laterality: N/A;  . WRIST FRACTURE SURGERY      REVIEW OF SYSTEMS:  Constitutional: negative Eyes: negative Ears, nose, mouth, throat, and face: negative Respiratory: positive for cough and pleurisy/chest pain Cardiovascular: negative Gastrointestinal: negative Genitourinary:negative Integument/breast: negative Hematologic/lymphatic: negative Musculoskeletal:negative Neurological: negative Behavioral/Psych: negative Endocrine: negative Allergic/Immunologic: negative   PHYSICAL EXAMINATION: General appearance: alert, cooperative and no distress Head: Normocephalic, without obvious abnormality, atraumatic Neck: no adenopathy, no JVD, supple, symmetrical, trachea midline and thyroid not enlarged, symmetric, no tenderness/mass/nodules Lymph nodes: Cervical, supraclavicular, and axillary nodes normal. Resp: wheezes bilaterally  Back: symmetric, no curvature. ROM normal. No CVA tenderness. Cardio: regular rate and rhythm, S1, S2 normal, no murmur, click, rub or gallop GI: soft, non-tender; bowel sounds normal; no masses,  no organomegaly Extremities: extremities normal, atraumatic, no cyanosis or edema Neurologic: Alert and oriented X 3, normal strength and tone. Normal symmetric reflexes. Normal coordination and gait  ECOG PERFORMANCE STATUS: 1 - Symptomatic but completely ambulatory  Blood pressure (!) 127/95, pulse (!) 103, temperature 98.7 F (37.1 C), temperature source Oral, resp. rate 18, height 5' 7.5" (1.715 m), weight 196 lb 3.2 oz (89  kg), SpO2 98 %.  LABORATORY DATA: Lab Results  Component Value Date   WBC 10.2 06/15/2017   HGB 13.8 06/15/2017   HCT 40.3 06/15/2017   MCV 94.0 06/15/2017   PLT 380 06/15/2017      Chemistry      Component Value Date/Time   NA 139 06/15/2017 0857   NA 140 05/22/2017 1212   K 4.0 06/15/2017 0857   K 4.2 05/22/2017 1212   CL 105 06/15/2017 0857   CO2 22 06/15/2017 0857   CO2 21 (L) 05/22/2017 1212   BUN 10 06/15/2017 0857   BUN 10.3 05/22/2017 1212   CREATININE 0.80 06/15/2017 0857   CREATININE 0.8 05/22/2017 1212      Component Value Date/Time   CALCIUM 9.9 06/15/2017 0857   CALCIUM 9.6 05/22/2017 1212   ALKPHOS 114 06/15/2017 0857   ALKPHOS 113 05/22/2017 1212   AST 27 06/15/2017 0857   AST 17 05/22/2017 1212   ALT 61 (H) 06/15/2017 0857   ALT 27 05/22/2017 1212   BILITOT 0.4 06/15/2017 0857   BILITOT 0.31 05/22/2017 1212       RADIOGRAPHIC STUDIES: Ct Chest W Contrast  Result Date: 05/18/2017 CLINICAL DATA:  52 year old female complaining of shortness of breath for the past 1.5 months and sternal pain for the past 2 months. Intermittent hemoptysis. 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 EXAM: CT CHEST WITH CONTRAST TECHNIQUE: Multidetector CT imaging of the chest was performed during intravenous contrast administration. CONTRAST:  17m ISOVUE-300 IOPAMIDOL (ISOVUE-300) INJECTION 61% COMPARISON:  75 mL of Isovue-300. FINDINGS: Cardiovascular: Heart size is normal. Increasing areas of pericardial thickening and nodularity. This is most evident just lateral to the left side of the pulmonic trunk (axial image 59 of series 2, and adjacent to the left ventricular apex (axial image 90 of series 2. There is a new nodular area of pericardial thickening on axial image 90 of series 2 measuring 12 x 8 mm. These findings suggest developing  pericardial metastasis, presumably from direct invasion from the previously demonstrated left hilar mass. Atherosclerosis in the thoracic aorta. No coronary artery calcifications. Mediastinum/Nodes: Enlarged high right paratracheal lymph node measuring 17 mm in short axis with low-attenuation center. Prominent soft tissue in the left hilar region, without discrete enlarged lymph node. New prominent but nonenlarged lymph node measuring 8 mm in short axis adjacent to the low-attenuation lower esophagus (axial image 97 of series 2). Low left paratracheal lymph node measuring 11 mm in short axis, similar to the prior study. Esophagus is unremarkable in appearance. No axillary lymphadenopathy. 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 However, the areas of nodularity in the inferior segment of the lingula appear increased compared to  the prior examination. The largest of these is on axial image 79 of series 7, currently measuring 19 x 14 mm (previously 14 x 10 mm when measured in a similar fashion on exam 01/31/2017. Another nodule currently measures 16 x 10 mm (axial image 73 of series 7), previously 7 x 10 mm when measured in a similar fashion on the prior exam. New nodules are also noted in the inferior segment of the lingula, largest of which measures 7 mm on axial image 73 of series 7. Previously noted subpleural nodule in the posteromedial aspect of the right lower lobe currently measures 12 x 6 mm (previously 6 mm). Other new and enlarging nodules are also noted, including a new 5 mm nodule in the right lower lobe (axial image 135 of series 7), a left lower lobe nodule (image 104 of series 7) which currently measures 5 x 6 mm (previously only 3 mm), and a 4 mm right lower lobe nodule (axial image 98 of series 7) which previously measured only 2 mm. Tiny calcified  granuloma in the right upper lobe incidentally noted. No acute consolidative airspace disease. Trace left pleural effusion partially loculated superiorly and lying dependently inferiorly, similar to the prior examination. No right pleural effusion. Upper Abdomen: Several new hypovascular hepatic lesions are noted, highly concerning for new metastatic disease to the liver. The largest of these is in the central aspect of segment 8 (axial image 138 of series 2) measuring 1.9 x 2.7 cm. Subcentimeter low-attenuation lesions in both kidneys are too small to definitively characterize, but are statistically likely to represent tiny cysts. Aortic atherosclerosis. Musculoskeletal: There are no aggressive appearing lytic or blastic lesions noted in the visualized portions of the skeleton. IMPRESSION: 1. 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 2. Additional incidental findings, as above. Aortic Atherosclerosis (ICD10-I70.0). Electronically Signed   By: Vinnie Langton M.D.   On: 05/18/2017 11:35   Mr Jeri Cos UX Contrast  Result Date: 06/08/2017 CLINICAL DATA:  SRS targeting. Confusion and visual problems. Numbness. Unresectable stage III non-small cell lung cancer, liver metastases, on chemotherapy. EXAM: MRI HEAD WITHOUT AND WITH CONTRAST TECHNIQUE: Multiplanar, multiecho pulse sequences of the brain and surrounding structures were obtained without and with intravenous contrast. CONTRAST:  78m MULTIHANCE GADOBENATE DIMEGLUMINE 529 MG/ML IV SOLN COMPARISON:  [facility]. FINDINGS: Brain: LEFT lateral cerebellar metastasis, 4 mm, and RIGHT medial cerebellar metastasis, 3 mm are redemonstrated. They demonstrate increased conspicuity on post infusion  imaging, although there is not dramatic increase in size. There is only minor T2 and FLAIR signal associated with both. No new lesions are seen. Premature for age atrophy without significant white matter disease. Vascular: Normal flow voids. Skull and upper cervical spine: Normal marrow signal. Sinuses/Orbits: No sinus fluid or significant opacity. Other: None. IMPRESSION: Increased conspicuity, without significant change in size, on post infusion imaging of the two previous identified cerebellar metastases. It is unclear if the more avid enhancement on today's scan is due to progression of disease/increased vascularity or is a reflection of the 3 Tesla field strength of today's examination. Continued surveillance is warranted. Electronically Signed   By: JStaci RighterM.D.   On: 06/08/2017 11:24   Ct Abdomen Pelvis W Contrast  Result Date: 05/17/2017 CLINICAL DATA:  Epigastric pain for several  months. History of lung cancer. EXAM: CT ABDOMEN AND PELVIS WITH CONTRAST TECHNIQUE: Multidetector CT imaging of the abdomen and pelvis was performed using the standard protocol following bolus administration of intravenous contrast. CONTRAST:  163m ISOVUE-300 IOPAMIDOL (ISOVUE-300) INJECTION 61% COMPARISON:  PET-CT 04/04/2016, CT chest 01/31/2017 in the pole of the FINDINGS: Lower chest: Subpleural nodule in the RIGHT lower lobe measures 10 mm (image number 1, series 3) which compares to 6 mm on CT 01/31/2017. Very small subpleural nodule in the LEFT lower lobe anteriorly measures on image 7, series 3 is not changed. Nodule over the hemidiaphragm measuring 5 mm (images 6, series 3) compares with 4 mm. Hepatobiliary: Round 2.3 cm lesion in the central RIGHT hepatic lobe (segment 8, image 19, series 2) is new from comparison exams. Additional small hypodense lesion in subcapsular LEFT hepatic lobe (segment 4A) is also not seen on comparison exams. Pancreas: Pancreas is normal. No ductal dilatation. No pancreatic  inflammation. Spleen: Normal spleen Adrenals/urinary tract: Adrenal glands and kidneys are normal. The ureters and bladder normal. Stomach/Bowel: Stomach, small bowel, appendix, and cecum are normal. Multiple diverticular of the descending colon sigmoid colon. Small fluid collection along the LEFT external iliac vessels measuring 3.4 cm has low simple fluid attenuation and not changed from prior. The RIGHT ovary has a similar pattern to this LEFT lesion in both lesions are felt represent ovaries. Vascular/Lymphatic: Abdominal aorta is normal caliber. There is no retroperitoneal or periportal lymphadenopathy. No pelvic lymphadenopathy. Reproductive: Post hysterectomy anatomy Other: No free fluid. Musculoskeletal: No aggressive osseous lesion IMPRESSION: 1. New central RIGHT hepatic lobe lesion most consistent with hepatic metastasis. 2. Second low-density lesion LEFT hepatic lobe concerning for metastatic lesion 3. Interval increase in size of pleural-based nodules in LEFT and RIGHT lower lobe is concerning for lung cancer recurrence. These results will be called to the ordering clinician or representative by the Radiologist Assistant, and communication documented in the PACS or zVision Dashboard. Electronically Signed   By: SSuzy BouchardM.D.   On: 05/17/2017 16:42   [place]Biopsy (liver)  Result Date: 05/19/2017 CLINICAL DATA:  History of metastatic adenocarcinoma of the lung with worsening metastatic disease in the liver and lungs. Liver biopsy requested to assess tumor with molecular studies. EXAM: ULTRASOUND GUIDED CORE BIOPSY OF LIVER MEDICATIONS: 4.0 mg IV Versed; 200 mcg IV Fentanyl Total Moderate Sedation Time: 47 minutes. The patient's level of consciousness and physiologic status were continuously monitored during the procedure by Radiology nursing. PROCEDURE: The procedure, risks, benefits, and alternatives were explained to the patient. Questions regarding the procedure were encouraged and answered.  The patient understands and consents to the procedure. A time out was performed prior to initiating the procedure. The abdominal wall was prepped with chlorhexidine in a sterile fashion, and a sterile drape was applied covering the operative field. A sterile gown and sterile gloves were used for the procedure. Local anesthesia was provided with 1% Lidocaine. Ultrasound was performed of the liver. The largest lesion in the central right lobe was chosen for sampling. This was approached initially from a more anterior approach with advancement of a 17 gauge needle. Ultimately, this approach was abandoned and the needle removed. The lesion was then approached from a more lateral approach. Under ultrasound guidance, a 17 gauge needle was advanced to the margin of the lesion. Three separate coaxial 18 gauge core biopsy samples were obtained and submitted in formalin. A slurry of cut Gel-Foam pledgets were then injected through the outer needle as it was  retracted after completion of the procedure. COMPLICATIONS: None. FINDINGS: Central lesion in the right lobe of the liver measures approximately 3 cm in diameter. This is positioned adjacent to the central aspect of the right portal vein as well as central bile ducts. Smaller lesions in the higher left lobe of the liver were visible but due to subcapsular positioning and more superior location, could not be accurately sampled. From an initial anterior approach, a 17 gauge needle was advanced. It became apparent during needle advancement that the entire course of the needle would be difficult to visualize due to shadowing from adjacent ribs and cartilage. Also, along this approach it was felt that there would be higher risk of traversing central portal vein branches and bile ducts. This approach was therefore abandoned. From a lateral approach, a safer window was found which did not traverse major vessels or bile ducts. After a needle was advanced to the edge of the lesion,  coaxial core biopsy samples were obtained yielding solid tissue. IMPRESSION: Ultrasound-guided biopsy performed of the central right lobe mass measuring approximately 3 cm. Solid tissue was obtained. Electronically Signed   By: Aletta Edouard M.D.   On: 05/19/2017 17:05    ASSESSMENT AND PLAN:  This is a very pleasant 52 years old white female with unresectable a stage IIIA non-small cell lung cancer, adenocarcinoma status post a course of concurrent chemoradiation with partial response. The patient is currently undergoing consolidation immunotherapy with Imfinzi (Durvalumab) status post 18 cycles. This treatment was discontinued secondary to disease progression and new liver metastasis. Her molecular studies showed no evidence for actionable mutations. The patient was started on systemic chemotherapy with carboplatin, Alimta and Avastin status post 1 cycle.  She tolerated the first cycle of her treatment fairly well with no concerning complaints. I recommended for the patient to continue her current systemic chemotherapy and she will proceed with cycle #2 today. For pain management, she will continue on MS Contin and I gave her a refill of Percocet. The patient was advised to call immediately if she has any concerning symptoms in the interval. The patient voices understanding of current disease status and treatment options and is in agreement with the current care plan. All questions were answered. The patient knows to call the clinic with any problems, questions or concerns. We can certainly see the patient much sooner if necessary.  Disclaimer: This note was dictated with voice recognition software. Similar sounding words can inadvertently be transcribed and may not be corrected upon review.

## 2017-06-22 ENCOUNTER — Telehealth: Payer: Self-pay | Admitting: *Deleted

## 2017-06-22 ENCOUNTER — Inpatient Hospital Stay: Payer: No Typology Code available for payment source

## 2017-06-22 DIAGNOSIS — Z5111 Encounter for antineoplastic chemotherapy: Secondary | ICD-10-CM | POA: Diagnosis not present

## 2017-06-22 DIAGNOSIS — C3492 Malignant neoplasm of unspecified part of left bronchus or lung: Secondary | ICD-10-CM

## 2017-06-22 LAB — COMPREHENSIVE METABOLIC PANEL
ALK PHOS: 102 U/L (ref 40–150)
ALT: 56 U/L — ABNORMAL HIGH (ref 0–55)
AST: 38 U/L — ABNORMAL HIGH (ref 5–34)
Albumin: 3.9 g/dL (ref 3.5–5.0)
Anion gap: 9 (ref 3–11)
BUN: 9 mg/dL (ref 7–26)
CALCIUM: 9.7 mg/dL (ref 8.4–10.4)
CHLORIDE: 100 mmol/L (ref 98–109)
CO2: 27 mmol/L (ref 22–29)
Creatinine, Ser: 0.76 mg/dL (ref 0.60–1.10)
Glucose, Bld: 91 mg/dL (ref 70–140)
Potassium: 4.1 mmol/L (ref 3.3–4.7)
Sodium: 136 mmol/L (ref 136–145)
Total Bilirubin: 0.5 mg/dL (ref 0.2–1.2)
Total Protein: 7.4 g/dL (ref 6.4–8.3)

## 2017-06-22 LAB — CBC WITH DIFFERENTIAL/PLATELET
BASOS ABS: 0 10*3/uL (ref 0.0–0.1)
BASOS PCT: 1 %
Eosinophils Absolute: 0.1 10*3/uL (ref 0.0–0.5)
Eosinophils Relative: 1 %
HCT: 40.8 % (ref 34.8–46.6)
HEMOGLOBIN: 13.7 g/dL (ref 11.6–15.9)
Lymphocytes Relative: 32 %
Lymphs Abs: 1.4 10*3/uL (ref 0.9–3.3)
MCH: 31.6 pg (ref 25.1–34.0)
MCHC: 33.6 g/dL (ref 31.5–36.0)
MCV: 94.2 fL (ref 79.5–101.0)
Monocytes Absolute: 0.4 10*3/uL (ref 0.1–0.9)
Monocytes Relative: 10 %
NEUTROS PCT: 56 %
Neutro Abs: 2.4 10*3/uL (ref 1.5–6.5)
Platelets: 225 10*3/uL (ref 145–400)
RBC: 4.33 MIL/uL (ref 3.70–5.45)
RDW: 12.4 % (ref 11.2–16.1)
WBC: 4.4 10*3/uL (ref 3.9–10.3)

## 2017-06-22 NOTE — Telephone Encounter (Signed)
Pt here and requesting LAB ONLY appts be done at [facility].Message to scheduling and MD for review.

## 2017-06-22 NOTE — Telephone Encounter (Signed)
PT pharmacy advised last percocet Rx 1/17 was not filled with [facility] on [address]. Returned to lobby to discuss Rx with pt.  Ms Debbie Baker in lobby advised pt left and states " I'll just pick my Rx up on Monday"

## 2017-06-22 NOTE — Telephone Encounter (Signed)
Pt here at RN Desk requesting Rx for Percocet refill. Pt advised she did not call ahead, she was here for labs today. Pt last refill on 06/15/17 #60  10 day supply. Discussed with pt Her Rx is not due to be refilled until 1/27. Pt advsied she has been paying full price I will review with MD and advise. Requested pt wait in waiting room. Call to pt Pharmacy regarding why pt must pay full price

## 2017-06-23 ENCOUNTER — Other Ambulatory Visit: Payer: Self-pay | Admitting: General Surgery

## 2017-06-26 ENCOUNTER — Other Ambulatory Visit: Payer: Self-pay | Admitting: Oncology

## 2017-06-26 ENCOUNTER — Ambulatory Visit (HOSPITAL_COMMUNITY)
Admission: RE | Admit: 2017-06-26 | Discharge: 2017-06-26 | Disposition: A | Discharge status: Home / Self Care | Payer: No Typology Code available for payment source | Source: Ambulatory Visit | Attending: Oncology | Admitting: Oncology

## 2017-06-26 ENCOUNTER — Encounter (HOSPITAL_COMMUNITY): Payer: Self-pay

## 2017-06-26 DIAGNOSIS — C787 Secondary malignant neoplasm of liver and intrahepatic bile duct: Secondary | ICD-10-CM | POA: Diagnosis not present

## 2017-06-26 DIAGNOSIS — C3492 Malignant neoplasm of unspecified part of left bronchus or lung: Secondary | ICD-10-CM

## 2017-06-26 DIAGNOSIS — Z79899 Other long term (current) drug therapy: Secondary | ICD-10-CM | POA: Diagnosis not present

## 2017-06-26 DIAGNOSIS — K219 Gastro-esophageal reflux disease without esophagitis: Secondary | ICD-10-CM | POA: Diagnosis not present

## 2017-06-26 DIAGNOSIS — E785 Hyperlipidemia, unspecified: Secondary | ICD-10-CM | POA: Diagnosis not present

## 2017-06-26 DIAGNOSIS — H9192 Unspecified hearing loss, left ear: Secondary | ICD-10-CM | POA: Diagnosis not present

## 2017-06-26 DIAGNOSIS — Z85118 Personal history of other malignant neoplasm of bronchus and lung: Secondary | ICD-10-CM | POA: Insufficient documentation

## 2017-06-26 DIAGNOSIS — Z9221 Personal history of antineoplastic chemotherapy: Secondary | ICD-10-CM | POA: Diagnosis not present

## 2017-06-26 DIAGNOSIS — Z923 Personal history of irradiation: Secondary | ICD-10-CM | POA: Insufficient documentation

## 2017-06-26 HISTORY — PX: IR US GUIDE VASC ACCESS RIGHT: IMG2390

## 2017-06-26 HISTORY — PX: IR FLUORO GUIDE PORT INSERTION RIGHT: IMG5741

## 2017-06-26 LAB — CBC WITH DIFFERENTIAL/PLATELET
Basophils Absolute: 0 10*3/uL (ref 0.0–0.1)
Basophils Relative: 0 %
Eosinophils Absolute: 0 10*3/uL (ref 0.0–0.7)
Eosinophils Relative: 1 %
HEMATOCRIT: 39 % (ref 36.0–46.0)
HEMOGLOBIN: 13.3 g/dL (ref 12.0–15.0)
LYMPHS ABS: 1.3 10*3/uL (ref 0.7–4.0)
LYMPHS PCT: 32 %
MCH: 31.8 pg (ref 26.0–34.0)
MCHC: 34.1 g/dL (ref 30.0–36.0)
MCV: 93.3 fL (ref 78.0–100.0)
MONOS PCT: 16 %
Monocytes Absolute: 0.6 10*3/uL (ref 0.1–1.0)
NEUTROS ABS: 2 10*3/uL (ref 1.7–7.7)
NEUTROS PCT: 51 %
Platelets: 174 10*3/uL (ref 150–400)
RBC: 4.18 MIL/uL (ref 3.87–5.11)
RDW: 12.5 % (ref 11.5–15.5)
WBC: 4 10*3/uL (ref 4.0–10.5)

## 2017-06-26 LAB — PROTIME-INR
INR: 0.92
Prothrombin Time: 12.2 seconds (ref 11.4–15.2)

## 2017-06-26 MED ORDER — LIDOCAINE HCL 1 % IJ SOLN
INTRAMUSCULAR | Status: AC | PRN
  Administered 2017-06-26: 10 mL

## 2017-06-26 MED ORDER — FENTANYL CITRATE (PF) 100 MCG/2ML IJ SOLN
INTRAMUSCULAR | Status: AC | PRN
  Administered 2017-06-26 (×2): 25 ug via INTRAVENOUS
  Administered 2017-06-26: 50 ug via INTRAVENOUS

## 2017-06-26 MED ORDER — HEPARIN SOD (PORK) LOCK FLUSH 100 UNIT/ML IV SOLN
INTRAVENOUS | Status: AC
  Filled 2017-06-26: qty 5

## 2017-06-26 MED ORDER — FENTANYL CITRATE (PF) 100 MCG/2ML IJ SOLN
INTRAMUSCULAR | Status: AC
  Filled 2017-06-26: qty 4

## 2017-06-26 MED ORDER — LIDOCAINE-EPINEPHRINE (PF) 2 %-1:200000 IJ SOLN
INTRAMUSCULAR | Status: AC | PRN
  Administered 2017-06-26: 10 mL

## 2017-06-26 MED ORDER — LIDOCAINE-EPINEPHRINE (PF) 2 %-1:200000 IJ SOLN
INTRAMUSCULAR | Status: AC
  Filled 2017-06-26: qty 20

## 2017-06-26 MED ORDER — VANCOMYCIN HCL IN DEXTROSE 1-5 GM/200ML-% IV SOLN
1000.0000 mg | INTRAVENOUS | Status: AC
  Administered 2017-06-26: 1000 mg via INTRAVENOUS

## 2017-06-26 MED ORDER — SODIUM CHLORIDE 0.9 % IV SOLN
INTRAVENOUS | Status: DC
  Administered 2017-06-26: 13:00:00 via INTRAVENOUS

## 2017-06-26 MED ORDER — LIDOCAINE HCL 1 % IJ SOLN
INTRAMUSCULAR | Status: AC
  Filled 2017-06-26: qty 20

## 2017-06-26 MED ORDER — HEPARIN SOD (PORK) LOCK FLUSH 100 UNIT/ML IV SOLN
INTRAVENOUS | Status: AC | PRN
  Administered 2017-06-26: 500 [IU]

## 2017-06-26 MED ORDER — MIDAZOLAM HCL 2 MG/2ML IJ SOLN
INTRAMUSCULAR | Status: AC | PRN
  Administered 2017-06-26 (×4): 1 mg via INTRAVENOUS

## 2017-06-26 MED ORDER — VANCOMYCIN HCL IN DEXTROSE 1-5 GM/200ML-% IV SOLN
INTRAVENOUS | Status: AC
  Filled 2017-06-26: qty 200

## 2017-06-26 MED ORDER — MIDAZOLAM HCL 2 MG/2ML IJ SOLN
INTRAMUSCULAR | Status: AC
  Filled 2017-06-26: qty 6

## 2017-06-26 NOTE — Sedation Documentation (Signed)
Patient is resting comfortably. 

## 2017-06-26 NOTE — Procedures (Signed)
Met lung ca  S/p RT IJ POWER PORT  Tip svcra No comp Stable ebl 0 Ready for use Full report in pacs

## 2017-06-26 NOTE — Sedation Documentation (Signed)
Patient is resting comfortably. 

## 2017-06-26 NOTE — H&P (Signed)
Referring Physician(s): Mohamed,M  Supervising Physician: Daryll Brod  Patient Status:  [facility]   Chief Complaint:  "I'm here to get a port a cath"  Subjective: Patient familiar to IR service from prior liver lesion biopsy on 05/19/17.  She has a history of metastatic lung cancer with prior chemoradiation as well as immunotherapy.  She now has evidence of disease progression and presents today for Port-A-Cath placement for additional chemotherapy.  She currently denies fever, headache, chest pain, cough, nausea, vomiting or bleeding.  She does have some dyspnea with exertion, intermittent abdominal cramping and occasional back pain.   Past Medical History:  Diagnosis Date  . Allergic rhinitis   . Deaf, left   . Diverticulosis    sigmoid colon  . Dyspnea   . Encounter for antineoplastic chemotherapy 04/18/2016  . Encounter for antineoplastic immunotherapy 07/21/2016  . GERD (gastroesophageal reflux disease)   . Hyperlipidemia   . Lung cancer ([place]) 03/2016   non small cell lung cancer favoring adenocarcinoma   . Lung nodules   . Migraine   . Odynophagia 05/24/2016  . Pneumonia   . PONV (postoperative nausea and vomiting)   . Tachycardia, paroxysmal ([place]) 09/08/2016  . Wears glasses    Past Surgical History:  Procedure Laterality Date  . ABDOMINAL HYSTERECTOMY    . VIDEO BRONCHOSCOPY Bilateral 07/03/2015   Procedure: VIDEO BRONCHOSCOPY WITH FLUORO;  Surgeon: Marshell Garfinkel, MD;  Location: [facility];  Service: Cardiopulmonary;  Laterality: Bilateral;  . VIDEO BRONCHOSCOPY WITH ENDOBRONCHIAL ULTRASOUND N/A 04/06/2016   Procedure: VIDEO BRONCHOSCOPY WITH ENDOBRONCHIAL ULTRASOUND;  Surgeon: Collene Gobble, MD;  Location: [facility];  Service: Thoracic;  Laterality: N/A;  . WRIST FRACTURE SURGERY         Allergies: Penicillins; Chantix [varenicline]; and Levaquin [levofloxacin]  Medications: Prior to Admission medications   Medication Sig Start Date End Date Taking?  Authorizing Provider  albuterol (PROVENTIL HFA;VENTOLIN HFA) 108 (90 Base) MCG/ACT inhaler Inhale 2 puffs into the lungs every 6 (six) hours as needed for wheezing or shortness of breath. 09/16/16  Yes Ladell Pier, MD  benzonatate (TESSALON) 100 MG capsule Take 1 capsule (100 mg total) by mouth every 4 (four) hours as needed for cough. 06/07/17  Yes Curcio, Roselie Awkward, NP  Calcium-Magnesium-Vitamin D 185-50-100 MG-MG-UNIT CAPS Take 1 tablet by mouth every evening.   Yes [provider]  cetirizine (ZYRTEC) 10 MG tablet Take 10 mg by mouth daily.   Yes [provider]  diphenhydrAMINE (BENADRYL) 25 mg capsule Take 25 mg by mouth at bedtime as needed.   Yes [provider]  ELDERBERRY PO Take by mouth every morning.   Yes [provider]  folic acid (FOLVITE) 1 MG tablet Take 1 tablet (1 mg total) by mouth daily. 05/19/17  Yes Curcio, Roselie Awkward, NP  Green Tea, Camillia sinensis, (GREEN TEA EXTRACT PO) Take by mouth.   Yes [provider]  Lactobacillus (PROBIOTIC ACIDOPHILUS) CAPS Take by mouth.   Yes [provider]  Milk Thistle 1000 MG CAPS Take 1 capsule by mouth daily.   Yes [provider]  Misc Natural Products (BLACK COHOSH MENOPAUSE COMPLEX) TABS Take 1 capsule by mouth 2 (two) times daily.   Yes [provider]  Misc Natural Products (TURMERIC CURCUMIN) CAPS Take 1 capsule by mouth every evening. 1500 mg capsule   Yes [provider]  morphine (MSIR) 15 MG tablet Take 1 tablet (15 mg total) by mouth every 4 (four) hours as  needed for severe pain. 05/19/17  Yes Curcio, Roselie Awkward, NP  Multiple Vitamins-Minerals (WOMENS 50+ MULTI VITAMIN/MIN) TABS Take 1 tablet by mouth daily.   Yes [provider]  OVER THE COUNTER MEDICATION Take 250 mg by mouth daily. Jiaogulan Supplement   Yes [provider]  oxyCODONE-acetaminophen (PERCOCET/ROXICET) 5-325 MG tablet Take 1 tablet by mouth every 4 (four)  hours as needed (Chronic pain management). 06/15/17  Yes Curt Bears, MD  pantoprazole (PROTONIX) 40 MG tablet Take 1 tablet (40 mg total) 2 (two) times daily by mouth. 04/04/17  Yes Armbruster, Carlota Raspberry, MD  polyethylene glycol (MIRALAX / GLYCOLAX) packet Take 17 g by mouth daily.   Yes [provider]  pravastatin (PRAVACHOL) 20 MG tablet Take 20 mg by mouth every evening. 08/07/15  Yes [provider]  prochlorperazine (COMPAZINE) 10 MG tablet Take 1 tablet (10 mg total) by mouth every 6 (six) hours as needed for nausea or vomiting. 05/19/17  Yes Curcio, Roselie Awkward, NP  senna (SENOKOT) 8.6 MG tablet Take 1 tablet by mouth daily.   Yes [provider]  dexamethasone (DECADRON) 4 MG tablet Take 1 tablet twice a day the day before, day of, and day after each cycle of chemotherapy. 05/19/17   Maryanna Shape, NP  doxycycline (VIBRA-TABS) 100 MG tablet Take 1 tablet (100 mg total) by mouth 2 (two) times daily. 06/07/17   Maryanna Shape, NP  durvalumab (IMFINZI) 120 MG/2.4ML SOLN injection Inject 120 mg into the vein every 14 (fourteen) days.     [provider]  lidocaine-prilocaine (EMLA) cream Apply 1 application topically as needed. 06/07/17   Maryanna Shape, NP  LORazepam (ATIVAN) 0.5 MG tablet Take 1 tablet (0.5 mg total) by mouth as needed for anxiety (take 30 min prior to MRI scans and treatments). Patient not taking: Reported on 06/07/2017 05/18/17   Freeman Caldron, PA-C     Vital Signs: Blood pressure 131/104, heart rate 79, respirations 16, O2 sat 98% room air, temp 98.3    Physical Exam patient awake and alert in no acute distress.  Heart regular rate and rhythm no murmurs rubs or gallops; lungs clear to auscultation bilaterally; abd soft, bowel sounds present normal , NT  Imaging: No results found.  Labs:  CBC: Recent Labs    06/07/17 1437 06/15/17 0857 06/22/17 1104 06/26/17 1229  WBC 6.0 10.2 4.4 4.0  HGB 13.0 13.8 13.7 13.3  HCT  39.3 40.3 40.8 39.0  PLT 234 380 225 174    COAGS: Recent Labs    05/19/17 1124 06/26/17 1229  INR 0.95 0.92    BMP: Recent Labs    05/19/17 1124 05/22/17 1212 06/07/17 1437 06/15/17 0857 06/22/17 1104  NA 137 140 140 139 136  K 4.1 4.2 4.4 4.0 4.1  CL 100*  --  106 105 100  CO2 24 21* 26 22 27   GLUCOSE 92 116 85 140 91  BUN 11 10.3 11 10 9   CALCIUM 9.7 9.6 9.4 9.9 9.7  CREATININE 0.75 0.8 0.91 0.80 0.76  GFRNONAA >60  --  >60 >60 >60  GFRAA >60  --  >60 >60 >60    LIVER FUNCTION TESTS: Recent Labs    05/22/17 1212 06/07/17 1437 06/15/17 0857 06/22/17 1104  BILITOT 0.31 0.2 0.4 0.5  AST 17 42* 27 38*  ALT 27 78* 61* 56*  ALKPHOS 113 125 114 102  PROT 7.8 7.2 7.9 7.4  ALBUMIN 4.3 3.7 4.1 3.9  Assessment and Plan:  Pt with history of metastatic lung cancer with prior chemoradiation as well as immunotherapy.  She now has evidence of disease progression and presents today for Port-A-Cath placement for additional chemotherapy.Risks and benefits discussed with the patient/spouse including, but not limited to bleeding, infection, pneumothorax, or fibrin sheath development and need for additional procedures. All of the patient's questions were answered, patient is agreeable to proceed. Consent signed and in chart.     Electronically Signed: D. Rowe Robert, PA-C 06/26/2017, 1:48 PM   I spent a total of 20 minutes at the the patient's bedside AND on the patient's hospital floor or unit, greater than 50% of which was counseling/coordinating care for Port-A-Cath placement

## 2017-06-26 NOTE — Discharge Instructions (Addendum)
You may take off dressing and bathe in 24 hours.    Implanted Port Insertion, Care After This sheet gives you information about how to care for yourself after your procedure. Your health care provider may also give you more specific instructions. If you have problems or questions, contact your health care provider. What can I expect after the procedure? After your procedure, it is common to have:  Discomfort at the port insertion site.  Bruising on the skin over the port. This should improve over 3-4 days.  Follow these instructions at home: [facility] care  After your port is placed, you will get a manufacturer's information card. The card has information about your port. Keep this card with you at all times.  Take care of the port as told by your health care provider. Ask your health care provider if you or a family member can get training for taking care of the port at home. A home health care nurse may also take care of the port.  Make sure to remember what type of port you have. Incision care  Follow instructions from your health care provider about how to take care of your port insertion site. Make sure you: ? Wash your hands with soap and water before you change your bandage (dressing). If soap and water are not available, use hand sanitizer. ? Change your dressing as told by your health care provider. ? Leave stitches (sutures), skin glue, or adhesive strips in place. These skin closures may need to stay in place for 2 weeks or longer. If adhesive strip edges start to loosen and curl up, you may trim the loose edges. Do not remove adhesive strips completely unless your health care provider tells you to do that.  Check your port insertion site every day for signs of infection. Check for: ? More redness, swelling, or pain. ? More fluid or blood. ? Warmth. ? Pus or a bad smell. General instructions  Do not take baths, swim, or use a hot tub until your health care provider  approves.  Do not lift anything that is heavier than 10 lb (4.5 kg) for a week, or as told by your health care provider.  Ask your health care provider when it is okay to: ? Return to work or school. ? Resume usual physical activities or sports.  Do not drive for 24 hours if you were given a medicine to help you relax (sedative).  Take over-the-counter and prescription medicines only as told by your health care provider.  Wear a medical alert bracelet in case of an emergency. This will tell any health care providers that you have a port.  Keep all follow-up visits as told by your health care provider. This is important. Contact a health care provider if:  You cannot flush your port with saline as directed, or you cannot draw blood from the port.  You have a fever or chills.  You have more redness, swelling, or pain around your port insertion site.  You have more fluid or blood coming from your port insertion site.  Your port insertion site feels warm to the touch.  You have pus or a bad smell coming from the port insertion site. Get help right away if:  You have chest pain or shortness of breath.  You have bleeding from your port that you cannot control. Summary  Take care of the port as told by your health care provider.  Change your dressing as told by your health care  provider.  Keep all follow-up visits as told by your health care provider. This information is not intended to replace advice given to you by your health care provider. Make sure you discuss any questions you have with your health care provider. Document Released: 03/06/2013 Document Revised: 04/06/2016 Document Reviewed: 04/06/2016 Elsevier Interactive Patient Education  2017 [place] Insertion Implanted port insertion is a procedure to put in a port and catheter. The port is a device with an injectable disk that can be accessed by your health care provider. The port is  connected to a vein in the chest or neck by a small flexible tube (catheter). There are different types of ports. The implanted port may be used as a long-term IV access for:  Medicines, such as chemotherapy.  Fluids.  Liquid nutrition, such as total parenteral nutrition (TPN).  Blood samples.  Having a port means that your health care provider will not need to use the veins in your arms for these procedures. Tell a health care provider about:  Any allergies you have.  All medicines you are taking, especially blood thinners, as well as any vitamins, herbs, eye drops, creams, over-the-counter medicines, and steroids.  Any problems you or family members have had with anesthetic medicines.  Any blood disorders you have.  Any surgeries you have had.  Any medical conditions you have, including diabetes or kidney problems.  Whether you are pregnant or may be pregnant. What are the risks? Generally, this is a safe procedure. However, problems may occur, including:  Allergic reactions to medicines or dyes.  Damage to other structures or organs.  Infection.  Damage to the blood vessel, bruising, or bleeding at the puncture site.  Blood clot.  Breakdown of the skin over the port.  A collection of air in the chest that can cause one of the lungs to collapse (pneumothorax). This is rare.  What happens before the procedure? Staying hydrated Follow instructions from your health care provider about hydration, which may include:  Up to 2 hours before the procedure - you may continue to drink clear liquids, such as water, clear fruit juice, black coffee, and plain tea.  Eating and drinking restrictions  Follow instructions from your health care provider about eating and drinking, which may include: ? 8 hours before the procedure - stop eating heavy meals or foods such as meat, fried foods, or fatty foods. ? 6 hours before the procedure - stop eating light meals or foods, such as  toast or cereal. ? 6 hours before the procedure - stop drinking milk or drinks that contain milk. ? 2 hours before the procedure - stop drinking clear liquids. Medicines  Ask your health care provider about: ? Changing or stopping your regular medicines. This is especially important if you are taking diabetes medicines or blood thinners. ? Taking medicines such as aspirin and ibuprofen. These medicines can thin your blood. Do not take these medicines before your procedure if your health care provider instructs you not to.  You may be given antibiotic medicine to help prevent infection. General instructions  Plan to have someone take you home from the hospital or clinic.  If you will be going home right after the procedure, plan to have someone with you for 24 hours.  You may have blood tests.  You may be asked to shower with a germ-killing soap. What happens during the procedure?  To lower your risk of infection: ? Your health care team  will wash or sanitize their hands. ? Your skin will be washed with soap. ? Hair may be removed from the surgical area.  An IV tube will be inserted into one of your veins.  You will be given one or more of the following: ? A medicine to help you relax (sedative). ? A medicine to numb the area (local anesthetic).  Two small cuts (incisions) will be made to insert the port. ? One incision will be made in your neck to get access to the vein where the catheter will lie. ? The other incision will be made in the upper chest. This is where the port will lie.  The procedure may be done using continuous X-ray (fluoroscopy) or other imaging tools for guidance.  The port and catheter will be placed. There may be a small, raised area where the port is.  The port will be flushed with a salt solution (saline), and blood will be drawn to make sure that it is working correctly.  The incisions will be closed.  Bandages (dressings) may be placed over the  incisions. The procedure may vary among health care providers and hospitals. What happens after the procedure?  Your blood pressure, heart rate, breathing rate, and blood oxygen level will be monitored until the medicines you were given have worn off.  Do not drive for 24 hours if you were given a sedative.  You will be given a manufacturer's information card for the type of port that you have. Keep this with you.  Your port will need to be flushed and checked as told by your health care provider, usually every few weeks.  A chest X-ray will be done to: ? Check the placement of the port. ? Make sure there is no injury to your lung. Summary  Implanted port insertion is a procedure to put in a port and catheter.  The implanted port is used as a long-term IV access.  The port will need to be flushed and checked as told by your health care provider, usually every few weeks.  Keep your manufacturer's information card with you at all times. This information is not intended to replace advice given to you by your health care provider. Make sure you discuss any questions you have with your health care provider. Document Released: 03/06/2013 Document Revised: 04/06/2016 Document Reviewed: 04/06/2016 Elsevier Interactive Patient Education  2017 [place]. Moderate Conscious Sedation, Adult, Care After These instructions provide you with information about caring for yourself after your procedure. Your health care provider may also give you more specific instructions. Your treatment has been planned according to current medical practices, but problems sometimes occur. Call your health care provider if you have any problems or questions after your procedure. What can I expect after the procedure? After your procedure, it is common:  To feel sleepy for several hours.  To feel clumsy and have poor balance for several hours.  To have poor judgment for several hours.  To vomit if you eat too  soon.  Follow these instructions at home: For at least 24 hours after the procedure:   Do not: ? Participate in activities where you could fall or become injured. ? Drive. ? Use heavy machinery. ? Drink alcohol. ? Take sleeping pills or medicines that cause drowsiness. ? Make important decisions or sign legal documents. ? Take care of children on your own.  Rest. Eating and drinking  Follow the diet recommended by your health care provider.  If you vomit: ?  Drink water, juice, or soup when you can drink without vomiting. ? Make sure you have little or no nausea before eating solid foods. General instructions  Have a responsible adult stay with you until you are awake and alert.  Take over-the-counter and prescription medicines only as told by your health care provider.  If you smoke, do not smoke without supervision.  Keep all follow-up visits as told by your health care provider. This is important. Contact a health care provider if:  You keep feeling nauseous or you keep vomiting.  You feel light-headed.  You develop a rash.  You have a fever. Get help right away if:  You have trouble breathing. This information is not intended to replace advice given to you by your health care provider. Make sure you discuss any questions you have with your health care provider. Document Released: 03/06/2013 Document Revised: 10/19/2015 Document Reviewed: 09/05/2015 Elsevier Interactive Patient Education  [employer].

## 2017-06-27 ENCOUNTER — Other Ambulatory Visit: Payer: Self-pay | Admitting: Medical Oncology

## 2017-06-27 DIAGNOSIS — R590 Localized enlarged lymph nodes: Secondary | ICD-10-CM

## 2017-06-27 MED ORDER — OXYCODONE-ACETAMINOPHEN 5-325 MG PO TABS
1.0000 | ORAL_TABLET | ORAL | 0 refills | Status: DC | PRN
Start: 2017-06-27 — End: 2017-07-06

## 2017-06-29 ENCOUNTER — Other Ambulatory Visit: Payer: 59

## 2017-06-29 ENCOUNTER — Inpatient Hospital Stay: Payer: No Typology Code available for payment source

## 2017-06-29 DIAGNOSIS — Z5111 Encounter for antineoplastic chemotherapy: Secondary | ICD-10-CM | POA: Diagnosis not present

## 2017-06-29 DIAGNOSIS — C3492 Malignant neoplasm of unspecified part of left bronchus or lung: Secondary | ICD-10-CM

## 2017-06-29 LAB — CBC WITH DIFFERENTIAL/PLATELET
Basophils Absolute: 0 10*3/uL (ref 0.0–0.1)
Basophils Relative: 0 %
EOS PCT: 1 %
Eosinophils Absolute: 0.1 10*3/uL (ref 0.0–0.5)
HCT: 38 % (ref 34.8–46.6)
HEMOGLOBIN: 12.9 g/dL (ref 11.6–15.9)
LYMPHS ABS: 1.2 10*3/uL (ref 0.9–3.3)
LYMPHS PCT: 26 %
MCH: 32.5 pg (ref 26.0–34.0)
MCHC: 33.9 g/dL (ref 32.0–36.0)
MCV: 95.7 fL (ref 81.0–101.0)
MONOS PCT: 15 %
Monocytes Absolute: 0.7 10*3/uL (ref 0.1–0.9)
Neutro Abs: 2.6 10*3/uL (ref 1.5–6.5)
Neutrophils Relative %: 58 %
Platelets: 149 10*3/uL (ref 145–400)
RBC: 3.97 MIL/uL (ref 3.70–5.32)
RDW: 12.6 % (ref 11.1–15.7)
WBC: 4.5 10*3/uL (ref 3.9–10.0)

## 2017-06-29 LAB — COMPREHENSIVE METABOLIC PANEL
ALK PHOS: 102 U/L (ref 40–150)
ALT: 56 U/L — AB (ref 0–55)
ANION GAP: 8 (ref 3–11)
AST: 39 U/L — ABNORMAL HIGH (ref 5–34)
Albumin: 3.9 g/dL (ref 3.5–5.0)
BUN: 8 mg/dL (ref 7–26)
CALCIUM: 9.8 mg/dL (ref 8.4–10.4)
CO2: 27 mmol/L (ref 22–29)
CREATININE: 0.79 mg/dL (ref 0.60–1.10)
Chloride: 103 mmol/L (ref 98–109)
Glucose, Bld: 86 mg/dL (ref 70–140)
Potassium: 4.2 mmol/L (ref 3.5–5.1)
Sodium: 138 mmol/L (ref 136–145)
TOTAL PROTEIN: 7.4 g/dL (ref 6.4–8.3)
Total Bilirubin: 0.4 mg/dL (ref 0.2–1.2)

## 2017-07-06 ENCOUNTER — Inpatient Hospital Stay: Payer: 59

## 2017-07-06 ENCOUNTER — Telehealth: Payer: Self-pay | Admitting: Internal Medicine

## 2017-07-06 ENCOUNTER — Inpatient Hospital Stay (HOSPITAL_BASED_OUTPATIENT_CLINIC_OR_DEPARTMENT_OTHER): Payer: 59 | Admitting: Nurse Practitioner

## 2017-07-06 ENCOUNTER — Inpatient Hospital Stay: Payer: 59 | Attending: Internal Medicine

## 2017-07-06 ENCOUNTER — Other Ambulatory Visit: Payer: Self-pay | Admitting: Internal Medicine

## 2017-07-06 ENCOUNTER — Encounter: Payer: Self-pay | Admitting: Nurse Practitioner

## 2017-07-06 ENCOUNTER — Other Ambulatory Visit: Payer: Self-pay | Admitting: Medical Oncology

## 2017-07-06 VITALS — BP 111/67 | HR 74 | Resp 18

## 2017-07-06 VITALS — BP 123/90 | HR 93 | Temp 98.6°F | Resp 18 | Ht 68.0 in | Wt 193.5 lb

## 2017-07-06 DIAGNOSIS — R590 Localized enlarged lymph nodes: Secondary | ICD-10-CM

## 2017-07-06 DIAGNOSIS — Z9221 Personal history of antineoplastic chemotherapy: Secondary | ICD-10-CM | POA: Diagnosis not present

## 2017-07-06 DIAGNOSIS — K219 Gastro-esophageal reflux disease without esophagitis: Secondary | ICD-10-CM | POA: Diagnosis not present

## 2017-07-06 DIAGNOSIS — R091 Pleurisy: Secondary | ICD-10-CM | POA: Diagnosis not present

## 2017-07-06 DIAGNOSIS — Z923 Personal history of irradiation: Secondary | ICD-10-CM | POA: Insufficient documentation

## 2017-07-06 DIAGNOSIS — C3412 Malignant neoplasm of upper lobe, left bronchus or lung: Secondary | ICD-10-CM | POA: Insufficient documentation

## 2017-07-06 DIAGNOSIS — Z79899 Other long term (current) drug therapy: Secondary | ICD-10-CM | POA: Diagnosis not present

## 2017-07-06 DIAGNOSIS — Z792 Long term (current) use of antibiotics: Secondary | ICD-10-CM | POA: Insufficient documentation

## 2017-07-06 DIAGNOSIS — R05 Cough: Secondary | ICD-10-CM | POA: Insufficient documentation

## 2017-07-06 DIAGNOSIS — R5383 Other fatigue: Secondary | ICD-10-CM | POA: Diagnosis not present

## 2017-07-06 DIAGNOSIS — R0609 Other forms of dyspnea: Secondary | ICD-10-CM | POA: Insufficient documentation

## 2017-07-06 DIAGNOSIS — C3492 Malignant neoplasm of unspecified part of left bronchus or lung: Secondary | ICD-10-CM

## 2017-07-06 DIAGNOSIS — E785 Hyperlipidemia, unspecified: Secondary | ICD-10-CM | POA: Diagnosis not present

## 2017-07-06 DIAGNOSIS — C787 Secondary malignant neoplasm of liver and intrahepatic bile duct: Secondary | ICD-10-CM | POA: Diagnosis not present

## 2017-07-06 DIAGNOSIS — Z5112 Encounter for antineoplastic immunotherapy: Secondary | ICD-10-CM | POA: Insufficient documentation

## 2017-07-06 DIAGNOSIS — C7931 Secondary malignant neoplasm of brain: Secondary | ICD-10-CM | POA: Diagnosis not present

## 2017-07-06 DIAGNOSIS — Z5111 Encounter for antineoplastic chemotherapy: Secondary | ICD-10-CM | POA: Diagnosis present

## 2017-07-06 DIAGNOSIS — Z95828 Presence of other vascular implants and grafts: Secondary | ICD-10-CM | POA: Insufficient documentation

## 2017-07-06 LAB — CBC WITH DIFFERENTIAL/PLATELET
BASOS PCT: 1 %
Basophils Absolute: 0 10*3/uL (ref 0.0–0.1)
EOS ABS: 0 10*3/uL (ref 0.0–0.5)
Eosinophils Relative: 0 %
HEMATOCRIT: 38.1 % (ref 34.8–46.6)
Hemoglobin: 13.1 g/dL (ref 11.6–15.9)
Lymphocytes Relative: 11 %
Lymphs Abs: 0.9 10*3/uL (ref 0.9–3.3)
MCH: 32.5 pg (ref 25.1–34.0)
MCHC: 34.3 g/dL (ref 31.5–36.0)
MCV: 94.7 fL (ref 79.5–101.0)
MONO ABS: 0.5 10*3/uL (ref 0.1–0.9)
Monocytes Relative: 6 %
NEUTROS PCT: 82 %
Neutro Abs: 6.5 10*3/uL (ref 1.5–6.5)
Platelets: 341 10*3/uL (ref 145–400)
RBC: 4.02 MIL/uL (ref 3.70–5.45)
RDW: 14.2 % (ref 11.2–14.5)
WBC: 7.9 10*3/uL (ref 3.9–10.3)

## 2017-07-06 LAB — COMPREHENSIVE METABOLIC PANEL
ALBUMIN: 4.1 g/dL (ref 3.5–5.0)
ALT: 36 U/L (ref 0–55)
AST: 22 U/L (ref 5–34)
Alkaline Phosphatase: 100 U/L (ref 40–150)
Anion gap: 11 (ref 3–11)
BILIRUBIN TOTAL: 0.3 mg/dL (ref 0.2–1.2)
BUN: 7 mg/dL (ref 7–26)
CHLORIDE: 105 mmol/L (ref 98–109)
CO2: 23 mmol/L (ref 22–29)
Calcium: 10 mg/dL (ref 8.4–10.4)
Creatinine, Ser: 0.77 mg/dL (ref 0.60–1.10)
GFR calc Af Amer: >60 mL/min (ref >60)
GFR calc non Af Amer: >60 mL/min (ref >60)
GLUCOSE: 128 mg/dL (ref 70–140)
POTASSIUM: 4.1 mmol/L (ref 3.5–5.1)
SODIUM: 139 mmol/L (ref 136–145)
TOTAL PROTEIN: 7.8 g/dL (ref 6.4–8.3)

## 2017-07-06 LAB — UA PROTEIN, DIPSTICK - CHCC: PROTEIN: NEGATIVE mg/dL

## 2017-07-06 MED ORDER — SODIUM CHLORIDE 0.9 % IV SOLN
485.0000 mg/m2 | Freq: Once | INTRAVENOUS | Status: AC
  Administered 2017-07-06: 1000 mg via INTRAVENOUS
  Filled 2017-07-06: qty 40

## 2017-07-06 MED ORDER — SODIUM CHLORIDE 0.9% FLUSH
10.0000 mL | INTRAVENOUS | Status: AC | PRN
  Filled 2017-07-06: qty 10

## 2017-07-06 MED ORDER — SODIUM CHLORIDE 0.9 % IV SOLN
15.4000 mg/kg | Freq: Once | INTRAVENOUS | Status: AC
  Administered 2017-07-06: 1400 mg via INTRAVENOUS
  Filled 2017-07-06: qty 48

## 2017-07-06 MED ORDER — PALONOSETRON HCL INJECTION 0.25 MG/5ML
INTRAVENOUS | Status: AC
  Filled 2017-07-06: qty 5

## 2017-07-06 MED ORDER — PALONOSETRON HCL INJECTION 0.25 MG/5ML
0.2500 mg | Freq: Once | INTRAVENOUS | Status: AC
  Administered 2017-07-06: 0.25 mg via INTRAVENOUS

## 2017-07-06 MED ORDER — SODIUM CHLORIDE 0.9 % IV SOLN
Freq: Once | INTRAVENOUS | Status: AC
  Administered 2017-07-06: 13:00:00 via INTRAVENOUS

## 2017-07-06 MED ORDER — OXYCODONE-ACETAMINOPHEN 5-325 MG PO TABS: 1.0000 | ORAL_TABLET | ORAL | 0 refills | Status: DC | PRN

## 2017-07-06 MED ORDER — SODIUM CHLORIDE 0.9 % IV SOLN
680.0000 mg | Freq: Once | INTRAVENOUS | Status: AC
  Administered 2017-07-06: 680 mg via INTRAVENOUS
  Filled 2017-07-06: qty 68

## 2017-07-06 MED ORDER — LEVOFLOXACIN 500 MG PO TABS: 500.0000 mg | ORAL_TABLET | Freq: Every day | ORAL | 0 refills | Status: DC

## 2017-07-06 MED ORDER — SODIUM CHLORIDE 0.9 % IV SOLN
Freq: Once | INTRAVENOUS | Status: AC
  Administered 2017-07-06: 14:00:00 via INTRAVENOUS
  Filled 2017-07-06: qty 5

## 2017-07-06 NOTE — Telephone Encounter (Signed)
Patient scheduled for appointments and was given contrast material for CT scan along with Radiology phone number.  AVS/Calendar printed per 2/7 los

## 2017-07-06 NOTE — Progress Notes (Addendum)
[place] [facility] OFFICE PROGRESS NOTE   DIAGNOSIS: Stage IIIA (T2a, N2, M0) non-small cell lung cancer, adenocarcinoma presented with left suprahilar mass, mediastinal lymphadenopathy and suspicious pulmonary nodule in the left upper lobe diagnosed in November 2017. No actionable mutations on [employer] testing.  Biomarker Findings Microsatellite Status - MS-Stable Tumor Mutational Burden - TMB-Low (3 Muts/Mb) Genomic Findings For a complete list of the genes assayed, please refer to the Appendix. STK11 loss exons 1 AXL amplification - equivocal? HGF amplification - equivocal? KRAS L19F RICTOR amplification CHEK2 T350f*15 FGF10 amplification TP53 Q136P 7 Disease relevant genes with no reportable alterations: RET, ROS1, ALK, BRAF, ERBB2, MET, EGFR  PDL1 Expression 0%.  PRIOR THERAPY:  1) Status post concurrent chemoradiation with weekly carboplatin for AUC of 2 and paclitaxel 45 MG/M2 for 7 cycles. Last dose was given 06/13/2016. 2) Immunotherapy with Imfinzi (Durvalumab) 10 MG/KG every 2 weeks. First dose 08/11/2016. Status post 18 cycles.  Discontinued secondary to disease progression.   CURRENT THERAPY: Systemic chemotherapy with carboplatin for AUC of 5, Alimta 500 mg/M2 and Avastin 15 mg/KG every 3 weeks status post 1 cycle.   INTERVAL HISTORY:   Ms. LPuffenbargerreturns as scheduled.  She completed cycle 2 carboplatin/Alimta/Avastin 06/15/2017.  Main complaint is progressive fatigue.  She notes constipation after each treatment.  No significant nausea.  She continues to have a cough and dyspnea on exertion.  The cough is now productive, yellow in color.  No hemoptysis.  No fever or chills.  She has occasional night sweats.  She denies any bleeding.  She has a new area of pain at the left scapula.  Objective:  Vital signs in last 24 hours:  Blood pressure 123/90, pulse 93, temperature 98.6 F (37 C), temperature source Oral, resp. rate 18, height 5' 8"  (1.727  m), weight 193 lb 8 oz (87.8 kg), SpO2 97 %.    HEENT: No thrush or ulcers. Resp: Lungs clear bilaterally.  No wheezes or rales.  No respiratory distress. Cardio: Regular rate and rhythm. GI: Abdomen soft and nontender.  No hepatomegaly. Vascular: No leg edema.  Calves soft and nontender.  PortaCath without erythema.  Lab Results:  Lab Results  Component Value Date   WBC 7.9 07/06/2017   HGB 13.1 07/06/2017   HCT 38.1 07/06/2017   MCV 94.7 07/06/2017   PLT 341 07/06/2017   NEUTROABS 6.5 07/06/2017    Imaging:  No results found.  Medications: I have reviewed the patient's current medications.  Assessment/Plan: 1. Metastatic non-small cell lung cancer, initially diagnosed as unresectable stage IIIA non-small cell lung cancer, adenocarcinoma, status post a course of concurrent chemoradiation with partial response status post consolidation immunotherapy with Imfinzi (Durvalumab) x 18 cycles, discontinued secondary to disease progression/new liver metastasis.  Molecular studies with no evidence for actionable mutations. Cycle 1 carboplatin, Alimta and Avastin 05/22/2017; cycle 2 06/15/2017. 2. Port-A-Cath placement 06/26/2017.   Disposition: Debbie Baker stable.  She has completed 2 cycles of carboplatin/Alimta/Avastin.  Plan to proceed with cycle 3 today as scheduled.  Restaging CTs after this cycle.  She has a persistent/worsening cough.  She will complete a 7-day course of Levaquin.  We discussed that she has Levaquin listed as an allergy.  It is not clear that she is allergic to Levaquin.  She agrees to proceed.  If she develops any adverse effects she will discontinue and contact the office.  She will return for a follow-up visit in 3 weeks.  She will contact the office in  the interim as outlined above or with any other problems.  Patient seen with Dr. Julien Nordmann.    Ned Card ANP/GNP-BC   07/06/2017  12:31 PM   ADDENDUM: Hematology/Oncology Attending: I had a  face-to-face encounter with the patient today.  I recommended her care plan.  This is a very pleasant 52 years old white female with metastatic non-small cell lung cancer that was initially diagnosed as a stage IIIa status post concurrent chemoradiation followed by consolidation treatment with immunotherapy with Imfinzi (Durvalumab) for 18 cycles discontinued secondary to disease progression and liver metastasis.  The patient is currently undergoing systemic chemotherapy with carboplatin, Alimta and Avastin status post 2 cycles.  She has been tolerating this treatment well except for fatigue in addition to persistent shortness of breath and now cough productive of yellowish sputum. I recommended for the patient to proceed with cycle #3 today as a scheduled. I will arrange for her to have repeat CT scan of the chest, abdomen and pelvis performed before the next cycle of her treatment. For the questionable bronchitis and inflammatory process in the lung, will start the patient on Levaquin 500 mg p.o. daily for 7 days. For pain management, the patient will continue with her current pain medications. She was advised to call immediately if she has any concerning symptoms in the interval.  Disclaimer: This note was dictated with voice recognition software. Similar sounding words can inadvertently be transcribed and may be missed upon review. Eilleen Kempf, MD 07/06/17

## 2017-07-06 NOTE — Patient Instructions (Signed)
[facility] Discharge Instructions for Patients Receiving Chemotherapy  Today you received the following chemotherapy agents Avastin, Alimta, and Carboplatin To help prevent nausea and vomiting after your treatment, we encourage you to take your nausea medication as directed   If you develop nausea and vomiting that is not controlled by your nausea medication, call the clinic.   BELOW ARE SYMPTOMS THAT SHOULD BE REPORTED IMMEDIATELY:  *FEVER GREATER THAN 100.5 F  *CHILLS WITH OR WITHOUT FEVER  NAUSEA AND VOMITING THAT IS NOT CONTROLLED WITH YOUR NAUSEA MEDICATION  *UNUSUAL SHORTNESS OF BREATH  *UNUSUAL BRUISING OR BLEEDING  TENDERNESS IN MOUTH AND THROAT WITH OR WITHOUT PRESENCE OF ULCERS  *URINARY PROBLEMS  *BOWEL PROBLEMS  UNUSUAL RASH Items with * indicate a potential emergency and should be followed up as soon as possible.  Feel free to call the clinic should you have any questions or concerns. The clinic phone number is [phone].  Please show the [place] at check-in to the Emergency Department and triage nurse.

## 2017-07-13 ENCOUNTER — Other Ambulatory Visit: Payer: 59

## 2017-07-13 ENCOUNTER — Inpatient Hospital Stay: Payer: 59

## 2017-07-13 DIAGNOSIS — C3492 Malignant neoplasm of unspecified part of left bronchus or lung: Secondary | ICD-10-CM

## 2017-07-13 DIAGNOSIS — Z5112 Encounter for antineoplastic immunotherapy: Secondary | ICD-10-CM | POA: Diagnosis not present

## 2017-07-13 LAB — CBC WITH DIFFERENTIAL/PLATELET
Basophils Absolute: 0 10*3/uL (ref 0.0–0.1)
Basophils Relative: 1 %
EOS ABS: 0.1 10*3/uL (ref 0.0–0.5)
Eosinophils Relative: 2 %
HEMATOCRIT: 38.3 % (ref 34.8–46.6)
HEMOGLOBIN: 12.9 g/dL (ref 11.6–15.9)
LYMPHS ABS: 1.4 10*3/uL (ref 0.9–3.3)
LYMPHS PCT: 35 %
MCH: 32.3 pg (ref 26.0–34.0)
MCHC: 33.7 g/dL (ref 32.0–36.0)
MCV: 96 fL (ref 81.0–101.0)
MONOS PCT: 16 %
Monocytes Absolute: 0.6 10*3/uL (ref 0.1–0.9)
NEUTROS ABS: 1.9 10*3/uL (ref 1.5–6.5)
NEUTROS PCT: 46 %
Platelets: 184 10*3/uL (ref 145–400)
RBC: 3.99 MIL/uL (ref 3.70–5.32)
RDW: 13.1 % (ref 11.1–15.7)
WBC: 4 10*3/uL (ref 3.9–10.0)

## 2017-07-13 LAB — COMPREHENSIVE METABOLIC PANEL
ALK PHOS: 113 U/L (ref 40–150)
ALT: 56 U/L — ABNORMAL HIGH (ref 0–55)
ANION GAP: 9 (ref 3–11)
AST: 47 U/L — ABNORMAL HIGH (ref 5–34)
Albumin: 3.8 g/dL (ref 3.5–5.0)
BILIRUBIN TOTAL: 0.4 mg/dL (ref 0.2–1.2)
BUN: 10 mg/dL (ref 7–26)
CALCIUM: 9.4 mg/dL (ref 8.4–10.4)
CO2: 28 mmol/L (ref 22–29)
CREATININE: 0.78 mg/dL (ref 0.60–1.10)
Chloride: 99 mmol/L (ref 98–109)
GFR calc non Af Amer: >60 mL/min (ref >60)
Glucose, Bld: 80 mg/dL (ref 70–140)
Potassium: 5 mmol/L (ref 3.5–5.1)
SODIUM: 136 mmol/L (ref 136–145)
Total Protein: 7.1 g/dL (ref 6.4–8.3)

## 2017-07-17 ENCOUNTER — Other Ambulatory Visit: Payer: Self-pay | Admitting: Internal Medicine

## 2017-07-17 DIAGNOSIS — R590 Localized enlarged lymph nodes: Secondary | ICD-10-CM

## 2017-07-18 ENCOUNTER — Other Ambulatory Visit: Payer: Self-pay | Admitting: *Deleted

## 2017-07-18 DIAGNOSIS — R590 Localized enlarged lymph nodes: Secondary | ICD-10-CM

## 2017-07-18 MED ORDER — OXYCODONE-ACETAMINOPHEN 5-325 MG PO TABS: 1.0000 | ORAL_TABLET | ORAL | 0 refills | Status: DC | PRN

## 2017-07-18 NOTE — Telephone Encounter (Signed)
Pt called states I requested refgill throught echart. Reviewed with MD, advised pt Refill ready for pick up.

## 2017-07-20 ENCOUNTER — Inpatient Hospital Stay: Payer: 59

## 2017-07-20 ENCOUNTER — Other Ambulatory Visit: Payer: 59

## 2017-07-20 DIAGNOSIS — Z5112 Encounter for antineoplastic immunotherapy: Secondary | ICD-10-CM | POA: Diagnosis not present

## 2017-07-20 DIAGNOSIS — C3492 Malignant neoplasm of unspecified part of left bronchus or lung: Secondary | ICD-10-CM

## 2017-07-20 LAB — COMPREHENSIVE METABOLIC PANEL
ALT: 61 U/L — ABNORMAL HIGH (ref 0–55)
AST: 45 U/L — ABNORMAL HIGH (ref 5–34)
Albumin: 3.7 g/dL (ref 3.5–5.0)
Alkaline Phosphatase: 103 U/L (ref 40–150)
Anion gap: 10 (ref 3–11)
BUN: 7 mg/dL (ref 7–26)
CO2: 27 mmol/L (ref 22–29)
Calcium: 9.8 mg/dL (ref 8.4–10.4)
Chloride: 103 mmol/L (ref 98–109)
Creatinine, Ser: 0.74 mg/dL (ref 0.60–1.10)
GFR calc Af Amer: >60 mL/min (ref >60)
GFR calc non Af Amer: >60 mL/min (ref >60)
Glucose, Bld: 78 mg/dL (ref 70–140)
Potassium: 4.2 mmol/L (ref 3.5–5.1)
Sodium: 140 mmol/L (ref 136–145)
Total Bilirubin: 0.4 mg/dL (ref 0.2–1.2)
Total Protein: 7.1 g/dL (ref 6.4–8.3)

## 2017-07-20 LAB — CBC WITH DIFFERENTIAL/PLATELET
BASOS ABS: 0 10*3/uL (ref 0.0–0.1)
BASOS PCT: 0 %
EOS ABS: 0 10*3/uL (ref 0.0–0.5)
Eosinophils Relative: 1 %
HEMATOCRIT: 37.4 % (ref 34.8–46.6)
Hemoglobin: 12.7 g/dL (ref 11.6–15.9)
Lymphocytes Relative: 17 %
Lymphs Abs: 1.1 10*3/uL (ref 0.9–3.3)
MCH: 32.9 pg (ref 26.0–34.0)
MCHC: 34 g/dL (ref 32.0–36.0)
MCV: 96.9 fL (ref 81.0–101.0)
MONO ABS: 0.7 10*3/uL (ref 0.1–0.9)
Monocytes Relative: 12 %
NEUTROS ABS: 4.3 10*3/uL (ref 1.5–6.5)
NEUTROS PCT: 70 %
Platelets: 146 10*3/uL (ref 145–400)
RBC: 3.86 MIL/uL (ref 3.70–5.32)
RDW: 14 % (ref 11.1–15.7)
WBC: 6.1 10*3/uL (ref 3.9–10.0)

## 2017-07-21 ENCOUNTER — Telehealth: Payer: Self-pay | Admitting: Medical Oncology

## 2017-07-21 NOTE — Telephone Encounter (Signed)
Message sent to scheduler to call pt with apt for scan.

## 2017-07-26 ENCOUNTER — Encounter (HOSPITAL_BASED_OUTPATIENT_CLINIC_OR_DEPARTMENT_OTHER): Payer: Self-pay

## 2017-07-26 ENCOUNTER — Ambulatory Visit (HOSPITAL_BASED_OUTPATIENT_CLINIC_OR_DEPARTMENT_OTHER)
Admission: RE | Admit: 2017-07-26 | Discharge: 2017-07-26 | Disposition: A | Discharge status: Home / Self Care | Payer: 59 | Source: Ambulatory Visit | Attending: Nurse Practitioner | Admitting: Nurse Practitioner

## 2017-07-26 ENCOUNTER — Inpatient Hospital Stay: Payer: 59

## 2017-07-26 DIAGNOSIS — I2510 Atherosclerotic heart disease of native coronary artery without angina pectoris: Secondary | ICD-10-CM | POA: Insufficient documentation

## 2017-07-26 DIAGNOSIS — C787 Secondary malignant neoplasm of liver and intrahepatic bile duct: Secondary | ICD-10-CM

## 2017-07-26 DIAGNOSIS — J439 Emphysema, unspecified: Secondary | ICD-10-CM | POA: Diagnosis not present

## 2017-07-26 DIAGNOSIS — I700 Atherosclerosis of aorta: Secondary | ICD-10-CM | POA: Insufficient documentation

## 2017-07-26 DIAGNOSIS — R590 Localized enlarged lymph nodes: Secondary | ICD-10-CM | POA: Diagnosis not present

## 2017-07-26 DIAGNOSIS — K769 Liver disease, unspecified: Secondary | ICD-10-CM | POA: Diagnosis not present

## 2017-07-26 DIAGNOSIS — C3492 Malignant neoplasm of unspecified part of left bronchus or lung: Secondary | ICD-10-CM

## 2017-07-26 DIAGNOSIS — C7800 Secondary malignant neoplasm of unspecified lung: Secondary | ICD-10-CM | POA: Diagnosis not present

## 2017-07-26 MED ORDER — IOPAMIDOL (ISOVUE-300) INJECTION 61%
100.0000 mL | Freq: Once | INTRAVENOUS | Status: AC | PRN
  Administered 2017-07-26: 100 mL via INTRAVENOUS

## 2017-07-27 ENCOUNTER — Inpatient Hospital Stay (HOSPITAL_BASED_OUTPATIENT_CLINIC_OR_DEPARTMENT_OTHER): Payer: 59 | Admitting: Internal Medicine

## 2017-07-27 ENCOUNTER — Ambulatory Visit: Payer: 59

## 2017-07-27 ENCOUNTER — Ambulatory Visit: Payer: 59 | Admitting: Internal Medicine

## 2017-07-27 ENCOUNTER — Other Ambulatory Visit: Payer: 59

## 2017-07-27 ENCOUNTER — Telehealth: Payer: Self-pay | Admitting: Internal Medicine

## 2017-07-27 ENCOUNTER — Encounter: Payer: Self-pay | Admitting: Internal Medicine

## 2017-07-27 ENCOUNTER — Inpatient Hospital Stay: Payer: 59

## 2017-07-27 VITALS — BP 130/94 | HR 93 | Temp 98.6°F | Resp 16

## 2017-07-27 DIAGNOSIS — C7931 Secondary malignant neoplasm of brain: Secondary | ICD-10-CM | POA: Diagnosis not present

## 2017-07-27 DIAGNOSIS — R05 Cough: Secondary | ICD-10-CM

## 2017-07-27 DIAGNOSIS — Z79899 Other long term (current) drug therapy: Secondary | ICD-10-CM

## 2017-07-27 DIAGNOSIS — R091 Pleurisy: Secondary | ICD-10-CM | POA: Diagnosis not present

## 2017-07-27 DIAGNOSIS — Z923 Personal history of irradiation: Secondary | ICD-10-CM | POA: Diagnosis not present

## 2017-07-27 DIAGNOSIS — R590 Localized enlarged lymph nodes: Secondary | ICD-10-CM

## 2017-07-27 DIAGNOSIS — C3492 Malignant neoplasm of unspecified part of left bronchus or lung: Secondary | ICD-10-CM

## 2017-07-27 DIAGNOSIS — C787 Secondary malignant neoplasm of liver and intrahepatic bile duct: Secondary | ICD-10-CM

## 2017-07-27 DIAGNOSIS — C3412 Malignant neoplasm of upper lobe, left bronchus or lung: Secondary | ICD-10-CM | POA: Diagnosis not present

## 2017-07-27 DIAGNOSIS — Z9221 Personal history of antineoplastic chemotherapy: Secondary | ICD-10-CM

## 2017-07-27 DIAGNOSIS — K219 Gastro-esophageal reflux disease without esophagitis: Secondary | ICD-10-CM | POA: Diagnosis not present

## 2017-07-27 DIAGNOSIS — R0609 Other forms of dyspnea: Secondary | ICD-10-CM

## 2017-07-27 DIAGNOSIS — Z792 Long term (current) use of antibiotics: Secondary | ICD-10-CM

## 2017-07-27 DIAGNOSIS — R5383 Other fatigue: Secondary | ICD-10-CM | POA: Diagnosis not present

## 2017-07-27 DIAGNOSIS — E785 Hyperlipidemia, unspecified: Secondary | ICD-10-CM | POA: Diagnosis not present

## 2017-07-27 DIAGNOSIS — Z5112 Encounter for antineoplastic immunotherapy: Secondary | ICD-10-CM | POA: Diagnosis not present

## 2017-07-27 DIAGNOSIS — Z95828 Presence of other vascular implants and grafts: Secondary | ICD-10-CM

## 2017-07-27 DIAGNOSIS — I479 Paroxysmal tachycardia, unspecified: Secondary | ICD-10-CM

## 2017-07-27 LAB — COMPREHENSIVE METABOLIC PANEL
ALBUMIN: 4.2 g/dL (ref 3.5–5.0)
ALK PHOS: 97 U/L (ref 40–150)
ALT: 65 U/L — ABNORMAL HIGH (ref 0–55)
ANION GAP: 12 — AB (ref 3–11)
AST: 34 U/L (ref 5–34)
BUN: 7 mg/dL (ref 7–26)
CO2: 26 mmol/L (ref 22–29)
Calcium: 10.3 mg/dL (ref 8.4–10.4)
Chloride: 102 mmol/L (ref 98–109)
Creatinine, Ser: 0.75 mg/dL (ref 0.60–1.10)
GFR calc Af Amer: >60 mL/min (ref >60)
GFR calc non Af Amer: >60 mL/min (ref >60)
GLUCOSE: 113 mg/dL (ref 70–140)
POTASSIUM: 4 mmol/L (ref 3.5–5.1)
SODIUM: 140 mmol/L (ref 136–145)
Total Bilirubin: 0.5 mg/dL (ref 0.2–1.2)
Total Protein: 7.9 g/dL (ref 6.4–8.3)

## 2017-07-27 LAB — CBC WITH DIFFERENTIAL/PLATELET
BASOS PCT: 1 %
Basophils Absolute: 0.1 10*3/uL (ref 0.0–0.1)
EOS PCT: 0 %
Eosinophils Absolute: 0 10*3/uL (ref 0.0–0.5)
HCT: 38.8 % (ref 34.8–46.6)
HEMOGLOBIN: 13.3 g/dL (ref 11.6–15.9)
Lymphocytes Relative: 14 %
Lymphs Abs: 1.1 10*3/uL (ref 0.9–3.3)
MCH: 32.7 pg (ref 25.1–34.0)
MCHC: 34.2 g/dL (ref 31.5–36.0)
MCV: 95.6 fL (ref 79.5–101.0)
MONOS PCT: 9 %
Monocytes Absolute: 0.7 10*3/uL (ref 0.1–0.9)
NEUTROS PCT: 76 %
Neutro Abs: 6.1 10*3/uL (ref 1.5–6.5)
PLATELETS: 346 10*3/uL (ref 145–400)
RBC: 4.06 MIL/uL (ref 3.70–5.45)
RDW: 15.5 % — AB (ref 11.2–14.5)
WBC: 7.9 10*3/uL (ref 3.9–10.3)

## 2017-07-27 LAB — TOTAL PROTEIN, URINE DIPSTICK: Protein, ur: NEGATIVE mg/dL

## 2017-07-27 MED ORDER — OXYCODONE-ACETAMINOPHEN 5-325 MG PO TABS: 1.0000 | ORAL_TABLET | ORAL | 0 refills | Status: DC | PRN

## 2017-07-27 MED ORDER — CYANOCOBALAMIN 1000 MCG/ML IJ SOLN
1000.0000 ug | Freq: Once | INTRAMUSCULAR | Status: AC
  Administered 2017-07-27: 1000 ug via INTRAMUSCULAR

## 2017-07-27 MED ORDER — SODIUM CHLORIDE 0.9 % IV SOLN
Freq: Once | INTRAVENOUS | Status: AC
  Administered 2017-07-27: 13:00:00 via INTRAVENOUS

## 2017-07-27 MED ORDER — SODIUM CHLORIDE 0.9 % IV SOLN
480.0000 mg/m2 | Freq: Once | INTRAVENOUS | Status: AC
  Administered 2017-07-27: 1000 mg via INTRAVENOUS
  Filled 2017-07-27: qty 40

## 2017-07-27 MED ORDER — FENTANYL 25 MCG/HR TD PT72: 25.0000 ug | MEDICATED_PATCH | TRANSDERMAL | 0 refills | Status: DC

## 2017-07-27 MED ORDER — BENZONATATE 100 MG PO CAPS: 100.0000 mg | ORAL_CAPSULE | ORAL | 1 refills | Status: AC | PRN

## 2017-07-27 MED ORDER — PALONOSETRON HCL INJECTION 0.25 MG/5ML
INTRAVENOUS | Status: AC
  Filled 2017-07-27: qty 5

## 2017-07-27 MED ORDER — PALONOSETRON HCL INJECTION 0.25 MG/5ML
0.2500 mg | Freq: Once | INTRAVENOUS | Status: AC
  Administered 2017-07-27: 0.25 mg via INTRAVENOUS

## 2017-07-27 MED ORDER — SODIUM CHLORIDE 0.9 % IV SOLN
Freq: Once | INTRAVENOUS | Status: AC
  Administered 2017-07-27: 13:00:00 via INTRAVENOUS
  Filled 2017-07-27: qty 5

## 2017-07-27 MED ORDER — CARBOPLATIN CHEMO INJECTION 600 MG/60ML
680.0000 mg | Freq: Once | INTRAVENOUS | Status: AC
  Administered 2017-07-27: 680 mg via INTRAVENOUS
  Filled 2017-07-27: qty 68

## 2017-07-27 MED ORDER — SODIUM CHLORIDE 0.9 % IV SOLN
15.5000 mg/kg | Freq: Once | INTRAVENOUS | Status: AC
  Administered 2017-07-27: 1400 mg via INTRAVENOUS
  Filled 2017-07-27: qty 48

## 2017-07-27 MED ORDER — SODIUM CHLORIDE 0.9% FLUSH
10.0000 mL | INTRAVENOUS | Status: DC | PRN
  Administered 2017-07-27: 10 mL
  Filled 2017-07-27: qty 10

## 2017-07-27 MED ORDER — CYANOCOBALAMIN 1000 MCG/ML IJ SOLN
INTRAMUSCULAR | Status: AC
  Filled 2017-07-27: qty 1

## 2017-07-27 MED ORDER — HEPARIN SOD (PORK) LOCK FLUSH 100 UNIT/ML IV SOLN
500.0000 [IU] | Freq: Once | INTRAVENOUS | Status: AC | PRN
  Administered 2017-07-27: 500 [IU]
  Filled 2017-07-27: qty 5

## 2017-07-27 NOTE — Progress Notes (Signed)
[facility] [phone]   [phone]  OFFICE PROGRESS NOTE  Hulan Fess, MD [place] [address]  DIAGNOSIS: Stage IIIA (T2a, N2, M0) non-small cell lung cancer, adenocarcinoma presented with left suprahilar mass, mediastinal lymphadenopathy and suspicious pulmonary nodule in the left upper lobe diagnosed in November 2017. No actionable mutations on [employer] testing.  Biomarker Findings Microsatellite Status - MS-Stable Tumor Mutational Burden - TMB-Low (3 Muts/Mb) Genomic Findings For a complete list of the genes assayed, please refer to the Appendix. STK11 loss exons 1 AXL amplification - equivocal? HGF amplification - equivocal? KRAS L19F RICTOR amplification CHEK2 T358f*15 FGF10 amplification TP53 Q136P 7 Disease relevant genes with no reportable alterations: RET, ROS1, ALK, BRAF, ERBB2, MET, EGFR  PDL1 Expression 0%.  PRIOR THERAPY:  1) Status post concurrent chemoradiation with weekly carboplatin for AUC of 2 and paclitaxel 45 MG/M2 for 7 cycles. Last dose was given 06/13/2016. 2) Immunotherapy with Imfinzi (Durvalumab) 10 MG/KG every 2 weeks. First dose 08/11/2016. Status post 18 cycles.  Discontinued secondary to disease progression.   CURRENT THERAPY: Systemic chemotherapy with carboplatin for AUC of 5, Alimta 500 mg/M2 and Avastin 15 mg/KG every 3 weeks status post 3 cycles.  INTERVAL HISTORY: Debbie LOLLIS52y.o. female returns to the clinic today for follow-up visit.  The patient is feeling fine with no specific complaints except for the pain at the lower rib cage more on the left side.  She is currently on Percocet 5/325 mg every 4 hours but she needs her pain medication earlier than the 4-hour.  She denied having any shortness of breath but continues to have mild cough with no hemoptysis.  She is requesting refill of Tessalon.  The patient denied having any fever or chills.  She has no nausea,  vomiting, diarrhea or constipation.  She denied having any recent weight loss or night sweats.  She continues to tolerate her systemic chemotherapy fairly well.  She had repeat CT scan of the chest, abdomen and pelvis performed recently and she is here for evaluation and discussion of her risk her results.  MEDICAL HISTORY: Past Medical History:  Diagnosis Date  . Allergic rhinitis   . Deaf, left   . Diverticulosis    sigmoid colon  . Dyspnea   . Encounter for antineoplastic chemotherapy 04/18/2016  . Encounter for antineoplastic immunotherapy 07/21/2016  . GERD (gastroesophageal reflux disease)   . Hyperlipidemia   . [place] cancer [place] 03[place]   non small cell lung cancer favoring adenocarcinoma   . Lung nodules   . Migraine   . Odynophagia 05/24/2016  . Pneumonia   . PONV (postoperative nausea and vomiting)   . [place]ox[place] 09/08/2016  . Wears glasses     ALLERGIES:  is allergic to penicillins; chantix [varenicline]; and levaquin [levofloxacin].  MEDICATIONS:  Current Outpatient Medications  Medication Sig Dispense Refill  . albuterol (PROVENTIL HFA;VENTOLIN HFA) 108 (90 Base) MCG/ACT inhaler Inhale 2 puffs into the lungs every 6 (six) hours as needed for wheezing or shortness of breath. 1 Inhaler 0  . benzonatate (TESSALON) 100 MG capsule Take 1 capsule (100 mg total) by mouth every 4 (four) hours as needed for cough. 60 capsule 1  . Calcium-Magnesium-Vitamin D 185-50-100 MG-MG-UNIT CAPS Take 1 tablet by mouth every evening.    . cetirizine (ZYRTEC) 10 MG tablet Take 10 mg by mouth daily.    .Marland Kitchendexamethasone (DECADRON) 4 MG tablet Take 1 tablet  twice a day the day before, day of, and day after each cycle of chemotherapy. 30 tablet 1  . diphenhydrAMINE (BENADRYL) 25 mg capsule Take 25 mg by mouth at bedtime as needed.    Marland Kitchen ELDERBERRY PO Take by mouth every morning.    . folic acid (FOLVITE) 1 MG tablet Take 1 tablet (1 mg total) by mouth daily. 30 tablet 2  .  Green Tea, Camillia sinensis, (GREEN TEA EXTRACT PO) Take by mouth.    . Lactobacillus (PROBIOTIC ACIDOPHILUS) CAPS Take by mouth.    . lidocaine-prilocaine (EMLA) cream Apply 1 application topically as needed. 30 g 2  . LORazepam (ATIVAN) 0.5 MG tablet Take 1 tablet (0.5 mg total) by mouth as needed for anxiety (take 30 min prior to MRI scans and treatments). 30 tablet 0  . Milk Thistle 1000 MG CAPS Take 1 capsule by mouth daily.    . Misc Natural Products (BLACK COHOSH MENOPAUSE COMPLEX) TABS Take 1 capsule by mouth 2 (two) times daily.    . Misc Natural Products (TURMERIC CURCUMIN) CAPS Take 1 capsule by mouth every evening. 1500 mg capsule    . morphine (MSIR) 15 MG tablet Take 1 tablet (15 mg total) by mouth every 4 (four) hours as needed for severe pain. 40 tablet 0  . Multiple Vitamins-Minerals (WOMENS 50+ MULTI VITAMIN/MIN) TABS Take 1 tablet by mouth daily.    Marland Kitchen OVER THE COUNTER MEDICATION Take 250 mg by mouth daily. Jiaogulan Supplement    . oxyCODONE-acetaminophen (PERCOCET/ROXICET) 5-325 MG tablet Take 1 tablet by mouth every 4 (four) hours as needed (Chronic pain management). 60 tablet 0  . pantoprazole (PROTONIX) 40 MG tablet Take 1 tablet (40 mg total) 2 (two) times daily by mouth. 180 tablet 3  . polyethylene glycol (MIRALAX / GLYCOLAX) packet Take 17 g by mouth daily.    . pravastatin (PRAVACHOL) 20 MG tablet Take 20 mg by mouth every evening.  3  . prochlorperazine (COMPAZINE) 10 MG tablet Take 1 tablet (10 mg total) by mouth every 6 (six) hours as needed for nausea or vomiting. 30 tablet 0  . senna (SENOKOT) 8.6 MG tablet Take 1 tablet by mouth daily.     No current facility-administered medications for this visit.    Facility-Administered Medications Ordered in Other Visits  Medication Dose Route Frequency Provider Last Rate Last Dose  . sodium chloride flush (NS) 0.9 % injection 10 mL  10 mL Intracatheter PRN Curt Bears, MD        SURGICAL HISTORY:  Past Surgical  History:  Procedure Laterality Date  . ABDOMINAL HYSTERECTOMY    . IR FLUORO GUIDE PORT INSERTION RIGHT  06/26/2017  . IR US GUIDE VASC ACCESS RIGHT  06/26/2017  . VIDEO BRONCHOSCOPY Bilateral 07/03/2015   Procedure: VIDEO BRONCHOSCOPY WITH FLUORO;  Surgeon: Marshell Garfinkel, MD;  Location: [facility];  Service: Cardiopulmonary;  Laterality: Bilateral;  . VIDEO BRONCHOSCOPY WITH ENDOBRONCHIAL ULTRASOUND N/A 04/06/2016   Procedure: VIDEO BRONCHOSCOPY WITH ENDOBRONCHIAL ULTRASOUND;  Surgeon: Collene Gobble, MD;  Location: [facility];  Service: Thoracic;  Laterality: N/A;  . WRIST FRACTURE SURGERY      REVIEW OF SYSTEMS:  Constitutional: positive for fatigue Eyes: negative Ears, nose, mouth, throat, and face: negative Respiratory: positive for cough and pleurisy/chest pain Cardiovascular: negative Gastrointestinal: negative Genitourinary:negative Integument/breast: negative Hematologic/lymphatic: negative Musculoskeletal:negative Neurological: negative Behavioral/Psych: negative Endocrine: negative Allergic/Immunologic: negative   PHYSICAL EXAMINATION: General appearance: alert, cooperative and no distress Head: Normocephalic, without obvious abnormality, atraumatic Neck: no adenopathy,  no JVD, supple, symmetrical, trachea midline and thyroid not enlarged, symmetric, no tenderness/mass/nodules Lymph nodes: Cervical, supraclavicular, and axillary nodes normal. Resp: wheezes bilaterally Back: symmetric, no curvature. ROM normal. No CVA tenderness. Cardio: regular rate and rhythm, S1, S2 normal, no murmur, click, rub or gallop GI: soft, non-tender; bowel sounds normal; no masses,  no organomegaly Extremities: extremities normal, atraumatic, no cyanosis or edema Neurologic: Alert and oriented X 3, normal strength and tone. Normal symmetric reflexes. Normal coordination and gait  ECOG PERFORMANCE STATUS: 1 - Symptomatic but completely ambulatory  Blood pressure (!) 135/91, pulse 97, temperature  99.2 F (37.3 C), temperature source Oral, resp. rate 18, height 5' 8"  (1.727 m), weight 192 lb 6.4 oz (87.3 kg), SpO2 97 %.  LABORATORY DATA: Lab Results  Component Value Date   WBC 7.9 07/27/2017   HGB 13.3 07/27/2017   HCT 38.8 07/27/2017   MCV 95.6 07/27/2017   PLT 346 07/27/2017      Chemistry      Component Value Date/Time   NA 140 07/20/2017 1112   NA 140 05/22/2017 1212   K 4.2 07/20/2017 1112   K 4.2 05/22/2017 1212   CL 103 07/20/2017 1112   CO2 27 07/20/2017 1112   CO2 21 (L) 05/22/2017 1212   BUN 7 07/20/2017 1112   BUN 10.3 05/22/2017 1212   CREATININE 0.74 07/20/2017 1112   CREATININE 0.8 05/22/2017 1212      Component Value Date/Time   CALCIUM 9.8 07/20/2017 1112   CALCIUM 9.6 05/22/2017 1212   ALKPHOS 103 07/20/2017 1112   ALKPHOS 113 05/22/2017 1212   AST 45 (H) 07/20/2017 1112   AST 17 05/22/2017 1212   ALT 61 (H) 07/20/2017 1112   ALT 27 05/22/2017 1212   BILITOT 0.4 07/20/2017 1112   BILITOT 0.31 05/22/2017 1212       RADIOGRAPHIC STUDIES: Ct Chest W Contrast  Result Date: 07/26/2017 CLINICAL DATA:  Lung cancer.  Left scapular pain for 3 weeks. EXAM: CT CHEST, ABDOMEN, AND PELVIS WITH CONTRAST TECHNIQUE: Multidetector CT imaging of the chest, abdomen and pelvis was performed following the standard protocol during bolus administration of intravenous contrast. CONTRAST:  18m ISOVUE-300 IOPAMIDOL (ISOVUE-300) INJECTION 61% COMPARISON:  CT chest 05/18/2017 and CT abdomen pelvis 05/17/2017. FINDINGS: CT CHEST FINDINGS Cardiovascular: Right IJ Port-A-Cath terminates in the low SVC. Coronary artery calcification. Heart size normal. No pericardial effusion. Stable 8 mm nodule along the left pericardial border (series 2, image 37). Mediastinum/Nodes: High right paratracheal lymph node measures 13 mm, previously 17 mm. Low left paratracheal lymph node measures approximately 10 mm, previously 11 mm. No hilar or axillary adenopathy. Esophagus is grossly  unremarkable. Lungs/Pleura: Mild centrilobular and moderate paraseptal emphysema. Biapical pleuroparenchymal scarring. There are multiple bilateral pulmonary nodules, measuring 5 mm or less in size, as before, seen in a hematogenous distribution. Post radiation scarring and parenchymal retraction in the perihilar left hemithorax. Nodular soft tissue in the inferior lingula measures 11 mm, similar. Subpleural posteromedial right lower lobe nodule measures 9 mm (series 3, image 91), stable. No pleural fluid. Airway is unremarkable. Musculoskeletal: Degenerative changes in the spine. No worrisome lytic or sclerotic lesions. CT ABDOMEN PELVIS FINDINGS Hepatobiliary: Low attenuation lesions in the peripheral left hepatic lobe measure up to 1.4 cm, previously 5 mm. Right hepatic lobe low-attenuation lesion measures 2 cm, previously 2.3 cm. Liver appears slightly decreased in attenuation diffusely. Liver and gallbladder are otherwise unremarkable. No biliary ductal dilatation. Pancreas: Negative. Spleen: Negative. Adrenals/Urinary Tract: Adrenal glands are  unremarkable. Subcentimeter lesions in the kidneys are too small to characterize but appear grossly stable. Ureters are decompressed. Bladder is grossly unremarkable. Stomach/Bowel: Stomach, small bowel and colon are unremarkable. Appendix is not readily visualized. Vascular/Lymphatic: Atherosclerotic calcification of the arterial vasculature without abdominal aortic aneurysm. No pathologically enlarged lymph nodes. Reproductive: Hysterectomy.  No adnexal mass. Other: No free fluid.  Mesenteries and peritoneum are unremarkable. Musculoskeletal: Degenerative changes in the spine. No worrisome lytic or sclerotic lesions. IMPRESSION: 1. Pulmonary metastatic disease appears grossly stable. 2. Liver lesions measure slightly different than on 05/17/2017, which may be due to differences in bolus timing between the two exams. 3. Possible pericardial nodule, stable. 4. Decrease  in size of a high right paratracheal lymph node. 5. Aortic atherosclerosis (ICD10-170.0). Coronary artery calcification. 6.  Emphysema (ICD10-J43.9). 7. Liver appears steatotic. Electronically Signed   By: Lorin Picket M.D.   On: 07/26/2017 12:09   Ct Abdomen Pelvis W Contrast  Result Date: 07/26/2017 CLINICAL DATA:  Lung cancer.  Left scapular pain for 3 weeks. EXAM: CT CHEST, ABDOMEN, AND PELVIS WITH CONTRAST TECHNIQUE: Multidetector CT imaging of the chest, abdomen and pelvis was performed following the standard protocol during bolus administration of intravenous contrast. CONTRAST:  14m ISOVUE-300 IOPAMIDOL (ISOVUE-300) INJECTION 61% COMPARISON:  CT chest 05/18/2017 and CT abdomen pelvis 05/17/2017. FINDINGS: CT CHEST FINDINGS Cardiovascular: Right IJ Port-A-Cath terminates in the low SVC. Coronary artery calcification. Heart size normal. No pericardial effusion. Stable 8 mm nodule along the left pericardial border (series 2, image 37). Mediastinum/Nodes: High right paratracheal lymph node measures 13 mm, previously 17 mm. Low left paratracheal lymph node measures approximately 10 mm, previously 11 mm. No hilar or axillary adenopathy. Esophagus is grossly unremarkable. Lungs/Pleura: Mild centrilobular and moderate paraseptal emphysema. Biapical pleuroparenchymal scarring. There are multiple bilateral pulmonary nodules, measuring 5 mm or less in size, as before, seen in a hematogenous distribution. Post radiation scarring and parenchymal retraction in the perihilar left hemithorax. Nodular soft tissue in the inferior lingula measures 11 mm, similar. Subpleural posteromedial right lower lobe nodule measures 9 mm (series 3, image 91), stable. No pleural fluid. Airway is unremarkable. Musculoskeletal: Degenerative changes in the spine. No worrisome lytic or sclerotic lesions. CT ABDOMEN PELVIS FINDINGS Hepatobiliary: Low attenuation lesions in the peripheral left hepatic lobe measure up to 1.4 cm,  previously 5 mm. Right hepatic lobe low-attenuation lesion measures 2 cm, previously 2.3 cm. Liver appears slightly decreased in attenuation diffusely. Liver and gallbladder are otherwise unremarkable. No biliary ductal dilatation. Pancreas: Negative. Spleen: Negative. Adrenals/Urinary Tract: Adrenal glands are unremarkable. Subcentimeter lesions in the kidneys are too small to characterize but appear grossly stable. Ureters are decompressed. Bladder is grossly unremarkable. Stomach/Bowel: Stomach, small bowel and colon are unremarkable. Appendix is not readily visualized. Vascular/Lymphatic: Atherosclerotic calcification of the arterial vasculature without abdominal aortic aneurysm. No pathologically enlarged lymph nodes. Reproductive: Hysterectomy.  No adnexal mass. Other: No free fluid.  Mesenteries and peritoneum are unremarkable. Musculoskeletal: Degenerative changes in the spine. No worrisome lytic or sclerotic lesions. IMPRESSION: 1. Pulmonary metastatic disease appears grossly stable. 2. Liver lesions measure slightly different than on 05/17/2017, which may be due to differences in bolus timing between the two exams. 3. Possible pericardial nodule, stable. 4. Decrease in size of a high right paratracheal lymph node. 5. Aortic atherosclerosis (ICD10-170.0). Coronary artery calcification. 6.  Emphysema (ICD10-J43.9). 7. Liver appears steatotic. Electronically Signed   By: MLorin PicketM.D.   On: 07/26/2017 12:09    ASSESSMENT AND PLAN:  This  is a very pleasant 51 years old white female with unresectable a stage IIIA non-small cell lung cancer, adenocarcinoma status post a course of concurrent chemoradiation with partial response. The patient is currently undergoing consolidation immunotherapy with Imfinzi (Durvalumab) status post 18 cycles. This treatment was discontinued secondary to disease progression and new liver metastasis. Her molecular studies showed no evidence for actionable mutations. The  patient was started on systemic chemotherapy with carboplatin, Alimta and Avastin status post 3 cycles.   The patient continues to tolerate this treatment fairly well with no concerning complaints. She had repeat CT scan of the chest, abdomen and pelvis.  I personally and independently reviewed the scan images and discussed the results with the patient today her scan showed no concerning findings for disease progression except for slightly enlarged 1 of the liver lesion but this could be secondary to differences in the bolus timing between the previous exam and the current exam. I recommended for the patient to continue her current treatment with carboplatin, Alimta and Avastin and she will proceed with cycle #4 today. For pain management, I started the patient on fentanyl patch 25 mcg/hour every 3 days in addition to Percocet every 4 hours as needed for pain.  I will give her a refill of these medications. For the cough, I gave her a refill of Tessalon. She will come back for follow-up visit in 3 weeks for evaluation with the start of cycle #5. She was advised to call immediately if she has any concerning symptoms in the interval. The patient voices understanding of current disease status and treatment options and is in agreement with the current care plan. All questions were answered. The patient knows to call the clinic with any problems, questions or concerns. We can certainly see the patient much sooner if necessary.  Disclaimer: This note was dictated with voice recognition software. Similar sounding words can inadvertently be transcribed and may not be corrected upon review.

## 2017-07-27 NOTE — Patient Instructions (Signed)
[facility] Discharge Instructions for Patients Receiving Chemotherapy  Today you received the following chemotherapy agents Avastin, alimta, carboplatin  To help prevent nausea and vomiting after your treatment, we encourage you to take your nausea medication as directed.    If you develop nausea and vomiting that is not controlled by your nausea medication, call the clinic.   BELOW ARE SYMPTOMS THAT SHOULD BE REPORTED IMMEDIATELY:  *FEVER GREATER THAN 100.5 F  *CHILLS WITH OR WITHOUT FEVER  NAUSEA AND VOMITING THAT IS NOT CONTROLLED WITH YOUR NAUSEA MEDICATION  *UNUSUAL SHORTNESS OF BREATH  *UNUSUAL BRUISING OR BLEEDING  TENDERNESS IN MOUTH AND THROAT WITH OR WITHOUT PRESENCE OF ULCERS  *URINARY PROBLEMS  *BOWEL PROBLEMS  UNUSUAL RASH Items with * indicate a potential emergency and should be followed up as soon as possible.  Feel free to call the clinic should you have any questions or concerns. The clinic phone number is [phone].  Please show the [place] at check-in to the Emergency Department and triage nurse.

## 2017-07-27 NOTE — Progress Notes (Signed)
B12 given today-See MAR    Treatment given per orders. Patient tolerated it well without problems. Vitals stable and discharged home from clinic ambulatory. Follow up as scheduled.

## 2017-07-27 NOTE — Telephone Encounter (Signed)
Scheduled appt per 2/28 los - Gave patient AVS and calender per los. Pt request weekly labs [place] [facility] - message sent to [facility] scheduler to schedule appts.

## 2017-07-27 NOTE — Progress Notes (Signed)
Pt reports several episodes of mouth sores that last 2 days and resolve on their own. I instructed her to call us the next time it happens.

## 2017-07-28 ENCOUNTER — Encounter: Payer: Self-pay | Admitting: Internal Medicine

## 2017-07-28 NOTE — Progress Notes (Signed)
Received PA request for Fentanyl patches.  Attempted to submit PA via Cover My Meds and member was not found. Called [employer](Miranda) whom state patient's plan termed on 07/27/17.  Called patient and she states she paid for the Fentanyl patches. She also states she has new coverage through [employer] effective 07/28/17. Advised patient to take her new card to her appointment next week at [facility] to be scanned asnd to advise her pharmacy of the new coverage as well. She verbalized understanding.  I am also sending an email to [facility] whom does prior auths for chemo which patient is scheduled to have on 08/17/17.

## 2017-08-03 ENCOUNTER — Telehealth: Payer: Self-pay | Admitting: Medical Oncology

## 2017-08-03 ENCOUNTER — Other Ambulatory Visit: Payer: Self-pay | Admitting: Medical Oncology

## 2017-08-03 ENCOUNTER — Other Ambulatory Visit: Payer: Self-pay | Admitting: Radiation Therapy

## 2017-08-03 ENCOUNTER — Other Ambulatory Visit: Payer: 59

## 2017-08-03 ENCOUNTER — Inpatient Hospital Stay: Payer: BLUE CROSS/BLUE SHIELD | Attending: Internal Medicine

## 2017-08-03 DIAGNOSIS — C7931 Secondary malignant neoplasm of brain: Secondary | ICD-10-CM

## 2017-08-03 DIAGNOSIS — E785 Hyperlipidemia, unspecified: Secondary | ICD-10-CM | POA: Diagnosis not present

## 2017-08-03 DIAGNOSIS — C3492 Malignant neoplasm of unspecified part of left bronchus or lung: Secondary | ICD-10-CM

## 2017-08-03 DIAGNOSIS — Z923 Personal history of irradiation: Secondary | ICD-10-CM | POA: Insufficient documentation

## 2017-08-03 DIAGNOSIS — Z5112 Encounter for antineoplastic immunotherapy: Secondary | ICD-10-CM | POA: Insufficient documentation

## 2017-08-03 DIAGNOSIS — C3412 Malignant neoplasm of upper lobe, left bronchus or lung: Secondary | ICD-10-CM | POA: Insufficient documentation

## 2017-08-03 DIAGNOSIS — R5383 Other fatigue: Secondary | ICD-10-CM | POA: Diagnosis not present

## 2017-08-03 DIAGNOSIS — R51 Headache: Secondary | ICD-10-CM | POA: Diagnosis not present

## 2017-08-03 DIAGNOSIS — C787 Secondary malignant neoplasm of liver and intrahepatic bile duct: Secondary | ICD-10-CM | POA: Diagnosis not present

## 2017-08-03 DIAGNOSIS — K219 Gastro-esophageal reflux disease without esophagitis: Secondary | ICD-10-CM | POA: Insufficient documentation

## 2017-08-03 DIAGNOSIS — R05 Cough: Secondary | ICD-10-CM | POA: Insufficient documentation

## 2017-08-03 DIAGNOSIS — Z5111 Encounter for antineoplastic chemotherapy: Secondary | ICD-10-CM | POA: Diagnosis not present

## 2017-08-03 DIAGNOSIS — Z79899 Other long term (current) drug therapy: Secondary | ICD-10-CM | POA: Diagnosis not present

## 2017-08-03 DIAGNOSIS — R590 Localized enlarged lymph nodes: Secondary | ICD-10-CM

## 2017-08-03 DIAGNOSIS — C7949 Secondary malignant neoplasm of other parts of nervous system: Principal | ICD-10-CM

## 2017-08-03 DIAGNOSIS — R0781 Pleurodynia: Secondary | ICD-10-CM | POA: Diagnosis not present

## 2017-08-03 LAB — CBC WITH DIFFERENTIAL/PLATELET
BASOS PCT: 1 %
Basophils Absolute: 0 10*3/uL (ref 0.0–0.1)
Eosinophils Absolute: 0.1 10*3/uL (ref 0.0–0.5)
Eosinophils Relative: 2 %
HEMATOCRIT: 38.9 % (ref 34.8–46.6)
HEMOGLOBIN: 13.1 g/dL (ref 11.6–15.9)
LYMPHS PCT: 38 %
Lymphs Abs: 1.5 10*3/uL (ref 0.9–3.3)
MCH: 33.2 pg (ref 26.0–34.0)
MCHC: 33.7 g/dL (ref 32.0–36.0)
MCV: 98.5 fL (ref 81.0–101.0)
MONO ABS: 0.6 10*3/uL (ref 0.1–0.9)
MONOS PCT: 14 %
NEUTROS PCT: 45 %
Neutro Abs: 1.8 10*3/uL (ref 1.5–6.5)
Platelets: 199 10*3/uL (ref 145–400)
RBC: 3.95 MIL/uL (ref 3.70–5.32)
RDW: 13.7 % (ref 11.1–15.7)
WBC: 3.9 10*3/uL (ref 3.9–10.0)

## 2017-08-03 LAB — COMPREHENSIVE METABOLIC PANEL
ALT: 68 U/L — AB (ref 0–55)
AST: 50 U/L — ABNORMAL HIGH (ref 5–34)
Albumin: 3.9 g/dL (ref 3.5–5.0)
Alkaline Phosphatase: 104 U/L (ref 40–150)
Anion gap: 8 (ref 3–11)
BUN: 10 mg/dL (ref 7–26)
CHLORIDE: 100 mmol/L (ref 98–109)
CO2: 29 mmol/L (ref 22–29)
CREATININE: 0.73 mg/dL (ref 0.60–1.10)
Calcium: 10 mg/dL (ref 8.4–10.4)
GFR calc non Af Amer: >60 mL/min (ref >60)
Glucose, Bld: 80 mg/dL (ref 70–140)
Potassium: 4.8 mmol/L (ref 3.5–5.1)
SODIUM: 137 mmol/L (ref 136–145)
Total Bilirubin: 0.6 mg/dL (ref 0.2–1.2)
Total Protein: 7.5 g/dL (ref 6.4–8.3)

## 2017-08-03 MED ORDER — OXYCODONE-ACETAMINOPHEN 5-325 MG PO TABS: 1.0000 | ORAL_TABLET | ORAL | 0 refills | Status: DC | PRN

## 2017-08-03 NOTE — Telephone Encounter (Signed)
Dislikes Fentanyl side effects.- sleepy,queasy, dizzy. She is not going to take anymore. It did resolve pain.  Requests alternative long acting med for pain control and refill for oxycodone. ACTION-Per Dr Julien Nordmann pt should continue fentanyl as her side effects may go away during treatment as her body adjusts to the medicine. I instructed her to get up slowly when rising from a sitting or lying position. Pt notified.

## 2017-08-07 ENCOUNTER — Telehealth: Payer: Self-pay | Admitting: *Deleted

## 2017-08-07 ENCOUNTER — Encounter: Payer: Self-pay | Admitting: Internal Medicine

## 2017-08-07 NOTE — Progress Notes (Signed)
Received PA request for Oxycodone 5-325mg .  Submitted via Cover My Meds. Jeralyn Ruths NP to get assistance with clinical answers.  Shaquoia Menge (Key: [id number])   Your information has been submitted to [place]. [employer] [place] will review the request and fax you a determination directly, typically within 3 business days of your submission once all necessary information is received. If [employer] has not responded in 3 business days or if you have any questions about your submission, contact [place] at [phone].

## 2017-08-07 NOTE — Telephone Encounter (Signed)
Pt called [facility]: " [facility] not [facility], they told me it must be prior authorized or I will have to pay out of pocket." Spoke [facility] at [facility] confirmed Percocet was not filled. PA# needed.  [facility] gave [facility]  PA phone number. [employer] called, s/w [place] who advised PA cannot be done via phone call. Paperwork will be faxed to our office. Turn around time is 3-5 days after fax has been received by [facility].

## 2017-08-07 NOTE — Progress Notes (Signed)
Received voicemail from [employer] needing additional clinical questions answered for Oxycodone PA. He also faxed form with questions.  Discussed with Erasmo Downer NP and Mechele Claude. Mary received same form. Gave number to [facility] w/BCBS. Stanton Kidney will call to complete.

## 2017-08-08 NOTE — Telephone Encounter (Signed)
Call to [facility] with additional information for PA. S/w [place] who advised he will submit the request and fax our office the approval/disapproval within 7 business days. Pt notified.

## 2017-08-09 ENCOUNTER — Other Ambulatory Visit: Payer: Self-pay | Admitting: Medical Oncology

## 2017-08-09 DIAGNOSIS — C3492 Malignant neoplasm of unspecified part of left bronchus or lung: Secondary | ICD-10-CM

## 2017-08-09 DIAGNOSIS — K121 Other forms of stomatitis: Secondary | ICD-10-CM

## 2017-08-09 MED ORDER — MAGIC MOUTHWASH
5.0000 mL | Freq: Four times a day (QID) | ORAL | 0 refills | Status: DC | PRN
Start: 2017-08-09 — End: 2017-08-09

## 2017-08-09 MED ORDER — FENTANYL 25 MCG/HR TD PT72: 25.0000 ug | MEDICATED_PATCH | TRANSDERMAL | 0 refills | Status: DC

## 2017-08-09 MED ORDER — MAGIC MOUTHWASH
5.0000 mL | Freq: Three times a day (TID) | ORAL | 0 refills | Status: DC
Start: 2017-08-09 — End: 2017-08-17

## 2017-08-09 NOTE — Addendum Note (Signed)
Addended by: Ardeen Garland on: 08/09/2017 12:06 PM   Modules accepted: Orders

## 2017-08-10 ENCOUNTER — Encounter: Payer: Self-pay | Admitting: Internal Medicine

## 2017-08-10 ENCOUNTER — Inpatient Hospital Stay: Payer: BLUE CROSS/BLUE SHIELD

## 2017-08-10 ENCOUNTER — Other Ambulatory Visit: Payer: 59

## 2017-08-10 DIAGNOSIS — C3492 Malignant neoplasm of unspecified part of left bronchus or lung: Secondary | ICD-10-CM

## 2017-08-10 DIAGNOSIS — C3412 Malignant neoplasm of upper lobe, left bronchus or lung: Secondary | ICD-10-CM | POA: Diagnosis not present

## 2017-08-10 LAB — CBC WITH DIFFERENTIAL/PLATELET
Basophils Absolute: 0 10*3/uL (ref 0.0–0.1)
Basophils Relative: 0 %
EOS ABS: 0 10*3/uL (ref 0.0–0.5)
EOS PCT: 1 %
HCT: 38.5 % (ref 34.8–46.6)
HEMOGLOBIN: 12.8 g/dL (ref 11.6–15.9)
LYMPHS ABS: 1.2 10*3/uL (ref 0.9–3.3)
Lymphocytes Relative: 22 %
MCH: 33.2 pg (ref 26.0–34.0)
MCHC: 33.2 g/dL (ref 32.0–36.0)
MCV: 99.7 fL (ref 81.0–101.0)
Monocytes Absolute: 0.9 10*3/uL (ref 0.1–0.9)
Monocytes Relative: 16 %
Neutro Abs: 3.3 10*3/uL (ref 1.5–6.5)
Neutrophils Relative %: 61 %
Platelets: 142 10*3/uL — ABNORMAL LOW (ref 145–400)
RBC: 3.86 MIL/uL (ref 3.70–5.32)
RDW: 14.9 % (ref 11.1–15.7)
WBC: 5.4 10*3/uL (ref 3.9–10.0)

## 2017-08-10 LAB — COMPREHENSIVE METABOLIC PANEL WITH GFR
ALT: 63 U/L — ABNORMAL HIGH (ref 0–55)
AST: 46 U/L — ABNORMAL HIGH (ref 5–34)
Albumin: 3.7 g/dL (ref 3.5–5.0)
Alkaline Phosphatase: 95 U/L (ref 40–150)
Anion gap: 8 (ref 3–11)
BUN: 5 mg/dL — ABNORMAL LOW (ref 7–26)
CO2: 27 mmol/L (ref 22–29)
Calcium: 9.9 mg/dL (ref 8.4–10.4)
Chloride: 104 mmol/L (ref 98–109)
Creatinine, Ser: 0.8 mg/dL (ref 0.60–1.10)
GFR calc Af Amer: >60 mL/min
GFR calc non Af Amer: >60 mL/min
Glucose, Bld: 101 mg/dL (ref 70–140)
Potassium: 4.6 mmol/L (ref 3.5–5.1)
Sodium: 139 mmol/L (ref 136–145)
Total Bilirubin: 0.4 mg/dL (ref 0.2–1.2)
Total Protein: 7.1 g/dL (ref 6.4–8.3)

## 2017-08-10 NOTE — Progress Notes (Signed)
Received PA request for Fentanyl patch 25MCG.  Submitted PA via Cover My Meds w/ assistance of Diane RN for clinical answers:  Akela Hepler (Key: [id number])   Your information has been submitted to [place]. Blue [employer] [place] will review the request and fax you a determination directly, typically within 3 business days of your submission once all necessary information is received. If [employer] has not responded in 3 business days or if you have any questions about your submission, contact [place] at [phone].

## 2017-08-11 ENCOUNTER — Telehealth: Payer: Self-pay | Admitting: Medical Oncology

## 2017-08-11 NOTE — Telephone Encounter (Signed)
Forms placed on Mohamed[facility] desk for PA.

## 2017-08-14 ENCOUNTER — Encounter: Payer: Self-pay | Admitting: Internal Medicine

## 2017-08-14 NOTE — Progress Notes (Signed)
Received PA determination for Fentanyl patch via Cover My Meds:  Debbie Baker Key: [id number] Need help? Call us at [phone]  Outcome  Approvedon March 17  Effective from 08/10/2017 through 08/09/2018.  DrugfentaNYL 25MCG/HR TD PT72  FormBlue Cross [place] Electronic Request Form (CB)

## 2017-08-14 NOTE — Progress Notes (Signed)
Received PA determination from [facility] for Oxycodone-Acetaminophen.  PA approved 08/07/17 - 09/05/17.

## 2017-08-17 ENCOUNTER — Encounter: Payer: Self-pay | Admitting: Internal Medicine

## 2017-08-17 ENCOUNTER — Telehealth: Payer: Self-pay | Admitting: Internal Medicine

## 2017-08-17 ENCOUNTER — Inpatient Hospital Stay (HOSPITAL_BASED_OUTPATIENT_CLINIC_OR_DEPARTMENT_OTHER): Payer: BLUE CROSS/BLUE SHIELD | Admitting: Internal Medicine

## 2017-08-17 ENCOUNTER — Inpatient Hospital Stay: Payer: BLUE CROSS/BLUE SHIELD

## 2017-08-17 ENCOUNTER — Other Ambulatory Visit: Payer: 59

## 2017-08-17 DIAGNOSIS — C787 Secondary malignant neoplasm of liver and intrahepatic bile duct: Secondary | ICD-10-CM | POA: Diagnosis not present

## 2017-08-17 DIAGNOSIS — C3412 Malignant neoplasm of upper lobe, left bronchus or lung: Secondary | ICD-10-CM

## 2017-08-17 DIAGNOSIS — Z95828 Presence of other vascular implants and grafts: Secondary | ICD-10-CM

## 2017-08-17 DIAGNOSIS — R590 Localized enlarged lymph nodes: Secondary | ICD-10-CM

## 2017-08-17 DIAGNOSIS — Z9221 Personal history of antineoplastic chemotherapy: Secondary | ICD-10-CM

## 2017-08-17 DIAGNOSIS — Z923 Personal history of irradiation: Secondary | ICD-10-CM

## 2017-08-17 DIAGNOSIS — R05 Cough: Secondary | ICD-10-CM | POA: Diagnosis not present

## 2017-08-17 DIAGNOSIS — C3492 Malignant neoplasm of unspecified part of left bronchus or lung: Secondary | ICD-10-CM

## 2017-08-17 LAB — COMPREHENSIVE METABOLIC PANEL
ALBUMIN: 4 g/dL (ref 3.5–5.0)
ALK PHOS: 82 U/L (ref 40–150)
ALT: 39 U/L (ref 0–55)
AST: 30 U/L (ref 5–34)
Anion gap: 9 (ref 3–11)
BILIRUBIN TOTAL: 0.5 mg/dL (ref 0.2–1.2)
BUN: 8 mg/dL (ref 7–26)
CALCIUM: 10 mg/dL (ref 8.4–10.4)
CO2: 24 mmol/L (ref 22–29)
Chloride: 105 mmol/L (ref 98–109)
Creatinine, Ser: 0.74 mg/dL (ref 0.60–1.10)
GFR calc Af Amer: >60 mL/min (ref >60)
GFR calc non Af Amer: >60 mL/min (ref >60)
GLUCOSE: 101 mg/dL (ref 70–140)
Potassium: 4.3 mmol/L (ref 3.5–5.1)
Sodium: 138 mmol/L (ref 136–145)
TOTAL PROTEIN: 7.5 g/dL (ref 6.4–8.3)

## 2017-08-17 LAB — CBC WITH DIFFERENTIAL/PLATELET
BASOS ABS: 0 10*3/uL (ref 0.0–0.1)
BASOS PCT: 0 %
Eosinophils Absolute: 0 10*3/uL (ref 0.0–0.5)
Eosinophils Relative: 0 %
HEMATOCRIT: 38.9 % (ref 34.8–46.6)
HEMOGLOBIN: 12.9 g/dL (ref 11.6–15.9)
Lymphocytes Relative: 15 %
Lymphs Abs: 1.2 10*3/uL (ref 0.9–3.3)
MCH: 33 pg (ref 25.1–34.0)
MCHC: 33.2 g/dL (ref 31.5–36.0)
MCV: 99.5 fL (ref 79.5–101.0)
MONOS PCT: 14 %
Monocytes Absolute: 1.1 10*3/uL — ABNORMAL HIGH (ref 0.1–0.9)
NEUTROS ABS: 5.4 10*3/uL (ref 1.5–6.5)
NEUTROS PCT: 71 %
Platelets: 293 10*3/uL (ref 145–400)
RBC: 3.91 MIL/uL (ref 3.70–5.45)
RDW: 15.8 % — ABNORMAL HIGH (ref 11.2–14.5)
WBC: 7.6 10*3/uL (ref 3.9–10.3)

## 2017-08-17 LAB — TOTAL PROTEIN, URINE DIPSTICK: Protein, ur: NEGATIVE mg/dL

## 2017-08-17 MED ORDER — PALONOSETRON HCL INJECTION 0.25 MG/5ML
0.2500 mg | Freq: Once | INTRAVENOUS | Status: AC
  Administered 2017-08-17: 0.25 mg via INTRAVENOUS

## 2017-08-17 MED ORDER — SODIUM CHLORIDE 0.9% FLUSH
10.0000 mL | INTRAVENOUS | Status: DC | PRN
  Administered 2017-08-17: 10 mL
  Filled 2017-08-17: qty 10

## 2017-08-17 MED ORDER — SODIUM CHLORIDE 0.9 % IV SOLN
Freq: Once | INTRAVENOUS | Status: AC
  Administered 2017-08-17: 11:00:00 via INTRAVENOUS
  Filled 2017-08-17: qty 5

## 2017-08-17 MED ORDER — SODIUM CHLORIDE 0.9 % IV SOLN
Freq: Once | INTRAVENOUS | Status: AC
  Administered 2017-08-17: 11:00:00 via INTRAVENOUS

## 2017-08-17 MED ORDER — OXYCODONE-ACETAMINOPHEN 5-325 MG PO TABS: 1.0000 | ORAL_TABLET | ORAL | 0 refills | Status: DC | PRN

## 2017-08-17 MED ORDER — HEPARIN SOD (PORK) LOCK FLUSH 100 UNIT/ML IV SOLN
500.0000 [IU] | Freq: Once | INTRAVENOUS | Status: AC | PRN
  Administered 2017-08-17: 500 [IU]
  Filled 2017-08-17: qty 5

## 2017-08-17 MED ORDER — LIDOCAINE-PRILOCAINE 2.5-2.5 % EX CREA
TOPICAL_CREAM | CUTANEOUS | Status: AC
  Filled 2017-08-17: qty 5

## 2017-08-17 MED ORDER — SODIUM CHLORIDE 0.9 % IV SOLN
1000.0000 mg | Freq: Once | INTRAVENOUS | Status: AC
  Administered 2017-08-17: 1000 mg via INTRAVENOUS
  Filled 2017-08-17: qty 40

## 2017-08-17 MED ORDER — SODIUM CHLORIDE 0.9 % IV SOLN
1400.0000 mg | Freq: Once | INTRAVENOUS | Status: AC
  Administered 2017-08-17: 1400 mg via INTRAVENOUS
  Filled 2017-08-17: qty 48

## 2017-08-17 MED ORDER — FOLIC ACID 1 MG PO TABS: 1.0000 mg | ORAL_TABLET | Freq: Every day | ORAL | 4 refills | Status: DC

## 2017-08-17 MED ORDER — PALONOSETRON HCL INJECTION 0.25 MG/5ML
INTRAVENOUS | Status: AC
  Filled 2017-08-17: qty 5

## 2017-08-17 MED ORDER — SODIUM CHLORIDE 0.9 % IV SOLN
700.0000 mg | Freq: Once | INTRAVENOUS | Status: AC
  Administered 2017-08-17: 700 mg via INTRAVENOUS
  Filled 2017-08-17: qty 70

## 2017-08-17 NOTE — Addendum Note (Signed)
Addended by: Ardeen Garland on: 08/17/2017 12:29 PM   Modules accepted: Orders

## 2017-08-17 NOTE — Telephone Encounter (Signed)
Appts already scheduled per 3/21 los.

## 2017-08-17 NOTE — Patient Instructions (Signed)
[facility] Discharge Instructions for Patients Receiving Chemotherapy  Today you received the following chemotherapy agents Avastin, alimta, carboplatin  To help prevent nausea and vomiting after your treatment, we encourage you to take your nausea medication as directed.    If you develop nausea and vomiting that is not controlled by your nausea medication, call the clinic.   BELOW ARE SYMPTOMS THAT SHOULD BE REPORTED IMMEDIATELY:  *FEVER GREATER THAN 100.5 F  *CHILLS WITH OR WITHOUT FEVER  NAUSEA AND VOMITING THAT IS NOT CONTROLLED WITH YOUR NAUSEA MEDICATION  *UNUSUAL SHORTNESS OF BREATH  *UNUSUAL BRUISING OR BLEEDING  TENDERNESS IN MOUTH AND THROAT WITH OR WITHOUT PRESENCE OF ULCERS  *URINARY PROBLEMS  *BOWEL PROBLEMS  UNUSUAL RASH Items with * indicate a potential emergency and should be followed up as soon as possible.  Feel free to call the clinic should you have any questions or concerns. The clinic phone number is [phone].  Please show the [facility] at check-in to the Emergency Department and triage nurse.

## 2017-08-17 NOTE — Progress Notes (Signed)
[facility] Telephone:([phone]) [phone]   [phone]) [phone]  OFFICE PROGRESS NOTE  Hulan Fess, MD [place] [address]  DIAGNOSIS: Stage IIIA (T2a, N2, M0) non-small cell lung cancer, adenocarcinoma presented with left suprahilar mass, mediastinal lymphadenopathy and suspicious pulmonary nodule in the left upper lobe diagnosed in November 2017. No actionable mutations on [employer] 360 testing.  Biomarker Findings Microsatellite Status - MS-Stable Tumor Mutational Burden - TMB-Low (3 Muts/Mb) Genomic Findings For a complete list of the genes assayed, please refer to the Appendix. STK11 loss exons 1 AXL amplification - equivocal? HGF amplification - equivocal? KRAS L19F RICTOR amplification CHEK2 T365f*15 FGF10 amplification TP53 Q136P 7 Disease relevant genes with no reportable alterations: RET, ROS1, ALK, BRAF, ERBB2, MET, EGFR  PDL1 Expression 0%.  PRIOR THERAPY:  1) Status post concurrent chemoradiation with weekly carboplatin for AUC of 2 and paclitaxel 45 MG/M2 for 7 cycles. Last dose was given 06/13/2016. 2) Immunotherapy with Imfinzi (Durvalumab) 10 MG/KG every 2 weeks. First dose 08/11/2016. Status post 18 cycles.  Discontinued secondary to disease progression.   CURRENT THERAPY: Systemic chemotherapy with carboplatin for AUC of 5, Alimta 500 mg/M2 and Avastin 15 mg/KG every 3 weeks status post 4 cycles.  INTERVAL HISTORY: Debbie HASTEN52y.o. female returns to the clinic today for follow-up visit.  The patient is feeling fine today with no specific complaints except for intermittent headache and the left lower rib cage pain.  She is currently on fentanyl patch as well as Percocet.  She denied having any current nausea, vomiting, diarrhea or constipation.  She denied having any fever or chills.  The patient continues to tolerate her treatment with carboplatin, Alimta and Avastin fairly well.  She is here today for evaluation before  starting cycle #5 of her treatment.   MEDICAL HISTORY: Past Medical History:  Diagnosis Date  . Allergic rhinitis   . Deaf, left   . Diverticulosis    sigmoid colon  . Dyspnea   . Encounter for antineoplastic chemotherapy 04/18/2016  . Encounter for antineoplastic immunotherapy 07/21/2016  . GERD (gastroesophageal reflux disease)   . Hyperlipidemia   . Lung cancer ([facility] 03/2016   non small cell lung cancer favoring adenocarcinoma   . Lung nodules   . Migraine   . Odynophagia 05/24/2016  . Pneumonia   . PONV (postoperative nausea and vomiting)   . Tachycardia, paroxysmal ([facility] 09/08/2016  . Wears glasses     ALLERGIES:  is allergic to penicillins; chantix [varenicline]; and levaquin [levofloxacin].  MEDICATIONS:  Current Outpatient Medications  Medication Sig Dispense Refill  . albuterol (PROVENTIL HFA;VENTOLIN HFA) 108 (90 Base) MCG/ACT inhaler Inhale 2 puffs into the lungs every 6 (six) hours as needed for wheezing or shortness of breath. 1 Inhaler 0  . benzonatate (TESSALON) 100 MG capsule Take 1 capsule (100 mg total) by mouth every 4 (four) hours as needed for cough. 60 capsule 1  . Calcium-Magnesium-Vitamin D 185-50-100 MG-MG-UNIT CAPS Take 1 tablet by mouth every evening.    . cetirizine (ZYRTEC) 10 MG tablet Take 10 mg by mouth daily.    .Marland Kitchendexamethasone (DECADRON) 4 MG tablet Take 1 tablet twice a day the day before, day of, and day after each cycle of chemotherapy. 30 tablet 1  . diphenhydrAMINE (BENADRYL) 25 mg capsule Take 25 mg by mouth at bedtime as needed.    .Marland KitchenELDERBERRY PO Take by mouth every morning.    . fentaNYL (DURAGESIC - DOSED  MCG/HR) 25 MCG/HR patch Place 1 patch (25 mcg total) onto the skin every 3 (three) days. 5 patch 0  . folic acid (FOLVITE) 1 MG tablet Take 1 tablet (1 mg total) by mouth daily. 30 tablet 2  . Green Tea, Camillia sinensis, (GREEN TEA EXTRACT PO) Take by mouth.    . Lactobacillus (PROBIOTIC ACIDOPHILUS) CAPS Take by mouth.    .  lidocaine-prilocaine (EMLA) cream Apply 1 application topically as needed. 30 g 2  . LORazepam (ATIVAN) 0.5 MG tablet Take 1 tablet (0.5 mg total) by mouth as needed for anxiety (take 30 min prior to MRI scans and treatments). 30 tablet 0  . magic mouthwash SOLN Take 5 mLs by mouth 4 (four) times daily -  with meals and at bedtime. Called in 08/05/17 by on call RN, Components benadryl  525 mg, hydrocortisone 60 mg and nystatin 0.6 mg. 240 ml 240 mL 0  . Milk Thistle 1000 MG CAPS Take 1 capsule by mouth daily.    . Misc Natural Products (BLACK COHOSH MENOPAUSE COMPLEX) TABS Take 1 capsule by mouth 2 (two) times daily.    . Misc Natural Products (TURMERIC CURCUMIN) CAPS Take 1 capsule by mouth every evening. 1500 mg capsule    . morphine (MSIR) 15 MG tablet Take 1 tablet (15 mg total) by mouth every 4 (four) hours as needed for severe pain. 40 tablet 0  . Multiple Vitamins-Minerals (WOMENS 50+ MULTI VITAMIN/MIN) TABS Take 1 tablet by mouth daily.    Marland Kitchen OVER THE COUNTER MEDICATION Take 250 mg by mouth daily. Jiaogulan Supplement    . oxyCODONE-acetaminophen (PERCOCET/ROXICET) 5-325 MG tablet Take 1 tablet by mouth every 4 (four) hours as needed (Chronic pain management). 60 tablet 0  . pantoprazole (PROTONIX) 40 MG tablet Take 1 tablet (40 mg total) 2 (two) times daily by mouth. 180 tablet 3  . polyethylene glycol (MIRALAX / GLYCOLAX) packet Take 17 g by mouth daily.    . pravastatin (PRAVACHOL) 20 MG tablet Take 20 mg by mouth every evening.  3  . prochlorperazine (COMPAZINE) 10 MG tablet Take 1 tablet (10 mg total) by mouth every 6 (six) hours as needed for nausea or vomiting. 30 tablet 0  . senna (SENOKOT) 8.6 MG tablet Take 1 tablet by mouth daily.     No current facility-administered medications for this visit.    Facility-Administered Medications Ordered in Other Visits  Medication Dose Route Frequency Provider Last Rate Last Dose  . sodium chloride flush (NS) 0.9 % injection 10 mL  10 mL  Intracatheter PRN Curt Bears, MD      . sodium chloride flush (NS) 0.9 % injection 10 mL  10 mL Intracatheter PRN Curt Bears, MD   10 mL at 08/17/17 1001    SURGICAL HISTORY:  Past Surgical History:  Procedure Laterality Date  . ABDOMINAL HYSTERECTOMY    . IR FLUORO GUIDE PORT INSERTION RIGHT  06/26/2017  . IR US GUIDE VASC ACCESS RIGHT  06/26/2017  . VIDEO BRONCHOSCOPY Bilateral 07/03/2015   Procedure: VIDEO BRONCHOSCOPY WITH FLUORO;  Surgeon: Marshell Garfinkel, MD;  Location: [facility];  Service: Cardiopulmonary;  Laterality: Bilateral;  . VIDEO BRONCHOSCOPY WITH ENDOBRONCHIAL ULTRASOUND N/A 04/06/2016   Procedure: VIDEO BRONCHOSCOPY WITH ENDOBRONCHIAL ULTRASOUND;  Surgeon: Collene Gobble, MD;  Location: [facility];  Service: Thoracic;  Laterality: N/A;  . WRIST FRACTURE SURGERY      REVIEW OF SYSTEMS:  A comprehensive review of systems was negative except for: Constitutional: positive for fatigue Neurological:  positive for headaches   PHYSICAL EXAMINATION: General appearance: alert, cooperative and no distress Head: Normocephalic, without obvious abnormality, atraumatic Neck: no adenopathy, no JVD, supple, symmetrical, trachea midline and thyroid not enlarged, symmetric, no tenderness/mass/nodules Lymph nodes: Cervical, supraclavicular, and axillary nodes normal. Resp: wheezes bilaterally Back: symmetric, no curvature. ROM normal. No CVA tenderness. Cardio: regular rate and rhythm, S1, S2 normal, no murmur, click, rub or gallop GI: soft, non-tender; bowel sounds normal; no masses,  no organomegaly Extremities: extremities normal, atraumatic, no cyanosis or edema  ECOG PERFORMANCE STATUS: 1 - Symptomatic but completely ambulatory  Blood pressure 132/85, pulse 96, temperature 98.6 F (37 C), temperature source Oral, resp. rate 20, height 5' 8"  (1.727 m), weight 197 lb (89.4 kg), SpO2 96 %.  LABORATORY DATA: Lab Results  Component Value Date   WBC 5.4 08/10/2017   HGB 12.8  08/10/2017   HCT 38.5 08/10/2017   MCV 99.7 08/10/2017   PLT 142 (L) 08/10/2017      Chemistry      Component Value Date/Time   NA 139 08/10/2017 1103   NA 140 05/22/2017 1212   K 4.6 08/10/2017 1103   K 4.2 05/22/2017 1212   CL 104 08/10/2017 1103   CO2 27 08/10/2017 1103   CO2 21 (L) 05/22/2017 1212   BUN 5 (L) 08/10/2017 1103   BUN 10.3 05/22/2017 1212   CREATININE 0.80 08/10/2017 1103   CREATININE 0.8 05/22/2017 1212      Component Value Date/Time   CALCIUM 9.9 08/10/2017 1103   CALCIUM 9.6 05/22/2017 1212   ALKPHOS 95 08/10/2017 1103   ALKPHOS 113 05/22/2017 1212   AST 46 (H) 08/10/2017 1103   AST 17 05/22/2017 1212   ALT 63 (H) 08/10/2017 1103   ALT 27 05/22/2017 1212   BILITOT 0.4 08/10/2017 1103   BILITOT 0.31 05/22/2017 1212       RADIOGRAPHIC STUDIES: Ct Chest W Contrast  Result Date: 07/26/2017 CLINICAL DATA:  Lung cancer.  Left scapular pain for 3 weeks. EXAM: CT CHEST, ABDOMEN, AND PELVIS WITH CONTRAST TECHNIQUE: Multidetector CT imaging of the chest, abdomen and pelvis was performed following the standard protocol during bolus administration of intravenous contrast. CONTRAST:  119m ISOVUE-300 IOPAMIDOL (ISOVUE-300) INJECTION 61% COMPARISON:  CT chest 05/18/2017 and CT abdomen pelvis 05/17/2017. FINDINGS: CT CHEST FINDINGS Cardiovascular: Right IJ Port-A-Cath terminates in the low SVC. Coronary artery calcification. Heart size normal. No pericardial effusion. Stable 8 mm nodule along the left pericardial border (series 2, image 37). Mediastinum/Nodes: High right paratracheal lymph node measures 13 mm, previously 17 mm. Low left paratracheal lymph node measures approximately 10 mm, previously 11 mm. No hilar or axillary adenopathy. Esophagus is grossly unremarkable. Lungs/Pleura: Mild centrilobular and moderate paraseptal emphysema. Biapical pleuroparenchymal scarring. There are multiple bilateral pulmonary nodules, measuring 5 mm or less in size, as before, seen  in a hematogenous distribution. Post radiation scarring and parenchymal retraction in the perihilar left hemithorax. Nodular soft tissue in the inferior lingula measures 11 mm, similar. Subpleural posteromedial right lower lobe nodule measures 9 mm (series 3, image 91), stable. No pleural fluid. Airway is unremarkable. Musculoskeletal: Degenerative changes in the spine. No worrisome lytic or sclerotic lesions. CT ABDOMEN PELVIS FINDINGS Hepatobiliary: Low attenuation lesions in the peripheral left hepatic lobe measure up to 1.4 cm, previously 5 mm. Right hepatic lobe low-attenuation lesion measures 2 cm, previously 2.3 cm. Liver appears slightly decreased in attenuation diffusely. Liver and gallbladder are otherwise unremarkable. No biliary ductal dilatation. Pancreas: Negative. Spleen: Negative.  Adrenals/Urinary Tract: Adrenal glands are unremarkable. Subcentimeter lesions in the kidneys are too small to characterize but appear grossly stable. Ureters are decompressed. Bladder is grossly unremarkable. Stomach/Bowel: Stomach, small bowel and colon are unremarkable. Appendix is not readily visualized. Vascular/Lymphatic: Atherosclerotic calcification of the arterial vasculature without abdominal aortic aneurysm. No pathologically enlarged lymph nodes. Reproductive: Hysterectomy.  No adnexal mass. Other: No free fluid.  Mesenteries and peritoneum are unremarkable. Musculoskeletal: Degenerative changes in the spine. No worrisome lytic or sclerotic lesions. IMPRESSION: 1. Pulmonary metastatic disease appears grossly stable. 2. Liver lesions measure slightly different than on 05/17/2017, which may be due to differences in bolus timing between the two exams. 3. Possible pericardial nodule, stable. 4. Decrease in size of a high right paratracheal lymph node. 5. Aortic atherosclerosis (ICD10-170.0). Coronary artery calcification. 6.  Emphysema (ICD10-J43.9). 7. Liver appears steatotic. Electronically Signed   By: Lorin Picket M.D.   On: 07/26/2017 12:09   Ct Abdomen Pelvis W Contrast  Result Date: 07/26/2017 CLINICAL DATA:  Lung cancer.  Left scapular pain for 3 weeks. EXAM: CT CHEST, ABDOMEN, AND PELVIS WITH CONTRAST TECHNIQUE: Multidetector CT imaging of the chest, abdomen and pelvis was performed following the standard protocol during bolus administration of intravenous contrast. CONTRAST:  157m ISOVUE-300 IOPAMIDOL (ISOVUE-300) INJECTION 61% COMPARISON:  CT chest 05/18/2017 and CT abdomen pelvis 05/17/2017. FINDINGS: CT CHEST FINDINGS Cardiovascular: Right IJ Port-A-Cath terminates in the low SVC. Coronary artery calcification. Heart size normal. No pericardial effusion. Stable 8 mm nodule along the left pericardial border (series 2, image 37). Mediastinum/Nodes: High right paratracheal lymph node measures 13 mm, previously 17 mm. Low left paratracheal lymph node measures approximately 10 mm, previously 11 mm. No hilar or axillary adenopathy. Esophagus is grossly unremarkable. Lungs/Pleura: Mild centrilobular and moderate paraseptal emphysema. Biapical pleuroparenchymal scarring. There are multiple bilateral pulmonary nodules, measuring 5 mm or less in size, as before, seen in a hematogenous distribution. Post radiation scarring and parenchymal retraction in the perihilar left hemithorax. Nodular soft tissue in the inferior lingula measures 11 mm, similar. Subpleural posteromedial right lower lobe nodule measures 9 mm (series 3, image 91), stable. No pleural fluid. Airway is unremarkable. Musculoskeletal: Degenerative changes in the spine. No worrisome lytic or sclerotic lesions. CT ABDOMEN PELVIS FINDINGS Hepatobiliary: Low attenuation lesions in the peripheral left hepatic lobe measure up to 1.4 cm, previously 5 mm. Right hepatic lobe low-attenuation lesion measures 2 cm, previously 2.3 cm. Liver appears slightly decreased in attenuation diffusely. Liver and gallbladder are otherwise unremarkable. No biliary ductal  dilatation. Pancreas: Negative. Spleen: Negative. Adrenals/Urinary Tract: Adrenal glands are unremarkable. Subcentimeter lesions in the kidneys are too small to characterize but appear grossly stable. Ureters are decompressed. Bladder is grossly unremarkable. Stomach/Bowel: Stomach, small bowel and colon are unremarkable. Appendix is not readily visualized. Vascular/Lymphatic: Atherosclerotic calcification of the arterial vasculature without abdominal aortic aneurysm. No pathologically enlarged lymph nodes. Reproductive: Hysterectomy.  No adnexal mass. Other: No free fluid.  Mesenteries and peritoneum are unremarkable. Musculoskeletal: Degenerative changes in the spine. No worrisome lytic or sclerotic lesions. IMPRESSION: 1. Pulmonary metastatic disease appears grossly stable. 2. Liver lesions measure slightly different than on 05/17/2017, which may be due to differences in bolus timing between the two exams. 3. Possible pericardial nodule, stable. 4. Decrease in size of a high right paratracheal lymph node. 5. Aortic atherosclerosis (ICD10-170.0). Coronary artery calcification. 6.  Emphysema (ICD10-J43.9). 7. Liver appears steatotic. Electronically Signed   By: MLorin PicketM.D.   On: 07/26/2017 12:09  ASSESSMENT AND PLAN:  This is a very pleasant 52 years old white female with unresectable a stage IIIA non-small cell lung cancer, adenocarcinoma status post a course of concurrent chemoradiation with partial response. The patient is currently undergoing consolidation immunotherapy with Imfinzi (Durvalumab) status post 18 cycles. This treatment was discontinued secondary to disease progression and new liver metastasis. Her molecular studies showed no evidence for actionable mutations. The patient was started on systemic chemotherapy with carboplatin, Alimta and Avastin status post 4 cycles.   She continues to tolerate this treatment well. I recommended for her to proceed with cycle #5 today as a  scheduled. For pain management, I started the patient on fentanyl patch 25 mcg/hour every 3 days in addition to Percocet every 4 hours as needed for pain.  I gave her a refill of Percocet today. For the cough, I gave her a refill of Tessalon. I will see her back for follow-up visit in 3 weeks for evaluation before starting cycle #6. She was advised to call immediately if she has any concerning symptoms in the interval. The patient voices understanding of current disease status and treatment options and is in agreement with the current care plan. All questions were answered. The patient knows to call the clinic with any problems, questions or concerns. We can certainly see the patient much sooner if necessary.  Disclaimer: This note was dictated with voice recognition software. Similar sounding words can inadvertently be transcribed and may not be corrected upon review.

## 2017-08-21 ENCOUNTER — Other Ambulatory Visit: Payer: Self-pay | Admitting: Medical Oncology

## 2017-08-21 ENCOUNTER — Other Ambulatory Visit: Payer: BLUE CROSS/BLUE SHIELD

## 2017-08-21 ENCOUNTER — Telehealth: Payer: Self-pay | Admitting: Medical Oncology

## 2017-08-21 DIAGNOSIS — R590 Localized enlarged lymph nodes: Secondary | ICD-10-CM

## 2017-08-21 NOTE — Telephone Encounter (Signed)
Received rx for oxycodone. She can only get 10 days at a time per insurance. It was filled a day early so next rx has to be for 9 days per pt.

## 2017-08-23 ENCOUNTER — Ambulatory Visit: Payer: Self-pay | Admitting: Urology

## 2017-08-24 ENCOUNTER — Other Ambulatory Visit: Payer: Self-pay | Admitting: Medical Oncology

## 2017-08-24 ENCOUNTER — Inpatient Hospital Stay: Payer: BLUE CROSS/BLUE SHIELD

## 2017-08-24 DIAGNOSIS — C3492 Malignant neoplasm of unspecified part of left bronchus or lung: Secondary | ICD-10-CM

## 2017-08-24 DIAGNOSIS — C3412 Malignant neoplasm of upper lobe, left bronchus or lung: Secondary | ICD-10-CM | POA: Diagnosis not present

## 2017-08-24 DIAGNOSIS — R590 Localized enlarged lymph nodes: Secondary | ICD-10-CM

## 2017-08-24 LAB — COMPREHENSIVE METABOLIC PANEL
ALK PHOS: 89 U/L (ref 40–150)
ALT: 34 U/L (ref 0–55)
ANION GAP: 8 (ref 3–11)
AST: 32 U/L (ref 5–34)
Albumin: 3.7 g/dL (ref 3.5–5.0)
BILIRUBIN TOTAL: 0.4 mg/dL (ref 0.2–1.2)
BUN: 9 mg/dL (ref 7–26)
CALCIUM: 9.6 mg/dL (ref 8.4–10.4)
CO2: 29 mmol/L (ref 22–29)
CREATININE: 0.7 mg/dL (ref 0.60–1.10)
Chloride: 101 mmol/L (ref 98–109)
GFR calc Af Amer: >60 mL/min (ref >60)
GFR calc non Af Amer: >60 mL/min (ref >60)
GLUCOSE: 82 mg/dL (ref 70–140)
Potassium: 5.1 mmol/L (ref 3.5–5.1)
Sodium: 138 mmol/L (ref 136–145)
TOTAL PROTEIN: 7.1 g/dL (ref 6.4–8.3)

## 2017-08-24 LAB — CBC WITH DIFFERENTIAL/PLATELET
Basophils Absolute: 0 10*3/uL (ref 0.0–0.1)
Basophils Relative: 1 %
Eosinophils Absolute: 0.1 10*3/uL (ref 0.0–0.5)
Eosinophils Relative: 3 %
HEMATOCRIT: 37.6 % (ref 34.8–46.6)
HEMOGLOBIN: 12.5 g/dL (ref 11.6–15.9)
LYMPHS ABS: 1.4 10*3/uL (ref 0.9–3.3)
LYMPHS PCT: 41 %
MCH: 33.4 pg (ref 26.0–34.0)
MCHC: 33.2 g/dL (ref 32.0–36.0)
MCV: 100.5 fL (ref 81.0–101.0)
Monocytes Absolute: 0.8 10*3/uL (ref 0.1–0.9)
Monocytes Relative: 23 %
NEUTROS ABS: 1.1 10*3/uL — AB (ref 1.5–6.5)
NEUTROS PCT: 32 %
Platelets: 170 10*3/uL (ref 145–400)
RBC: 3.74 MIL/uL (ref 3.70–5.32)
RDW: 13.8 % (ref 11.1–15.7)
WBC: 3.3 10*3/uL — AB (ref 3.9–10.0)

## 2017-08-24 MED ORDER — OXYCODONE-ACETAMINOPHEN 5-325 MG PO TABS: 1.0000 | ORAL_TABLET | ORAL | 0 refills | Status: DC | PRN

## 2017-08-24 NOTE — Progress Notes (Addendum)
Debbie Baker 52 y.o woman with 52 yo woman with stage T2a N2 M0 adenocarcinoma of the left upper lung - Stage IIIA with new findings on recent brain MRI suspicious for early metastatic disease vs subacute infarct,review 08-25-17 MRI brain w wo contrast  FU.    Weight changes, if any:No Wt Readings from Last 3 Encounters:  08/29/17 193 lb 3.2 oz (87.6 kg)  08/17/17 197 lb (89.4 kg)  07/27/17 192 lb 6.4 oz (87.3 kg)   Respiratory complaints, if any: SOB with exertion,coughing in am thick yellow has a sinus infection on antibiotic does not know her dosage to call back. Started sinus issues last Saturday. Hemoptysis, if any:   Swallowing Problems/Pain/Difficulty swallowing:No Appetite :Good Pain:No Headache:Sinus pressure Dizziness:No Nausea/vomiting:No Ringing in ears:No Visual changes (Blurred/ diplopia double vision,blind spots, and peripheral vsion changes):Hard to focus lately, plans to have her eyes checked. Fatigue:Yes Cognitive changes:Alert and oriented no changed with memory still forgetful. BP 136/87 (BP Location: Right Arm, Patient Position: Sitting, Cuff Size: Normal)   Pulse 100   Temp 98.6 F (37 C) (Oral)   Resp 16   Ht 5\' 8"  (1.727 m)   Wt 193 lb 3.2 oz (87.6 kg)   LMP  (LMP Unknown)   SpO2 96%   BMI 29.38 kg/m

## 2017-08-25 ENCOUNTER — Ambulatory Visit
Admission: RE | Admit: 2017-08-25 | Discharge: 2017-08-25 | Disposition: A | Discharge status: Home / Self Care | Payer: BLUE CROSS/BLUE SHIELD | Source: Ambulatory Visit | Attending: Radiation Oncology | Admitting: Radiation Oncology

## 2017-08-25 DIAGNOSIS — C7949 Secondary malignant neoplasm of other parts of nervous system: Principal | ICD-10-CM

## 2017-08-25 DIAGNOSIS — C7931 Secondary malignant neoplasm of brain: Secondary | ICD-10-CM

## 2017-08-25 MED ORDER — GADOBENATE DIMEGLUMINE 529 MG/ML IV SOLN
18.0000 mL | Freq: Once | INTRAVENOUS | Status: AC | PRN
  Administered 2017-08-25: 18 mL via INTRAVENOUS

## 2017-08-28 ENCOUNTER — Telehealth: Payer: Self-pay | Admitting: *Deleted

## 2017-08-28 NOTE — Telephone Encounter (Signed)
CALLED PATIENT TO INFORM OF FU APPT. BEING MOVED FROM 08-29-17 IN THE AM TO 08-29-17 @ 1 PM, SPOKE WITH PATIENT AND SHE IS AWARE OF THIS APPT. CHANGE AND IS GOOD WITH IT.

## 2017-08-29 ENCOUNTER — Other Ambulatory Visit: Payer: Self-pay

## 2017-08-29 ENCOUNTER — Ambulatory Visit
Admission: RE | Admit: 2017-08-29 | Discharge: 2017-08-29 | Disposition: A | Discharge status: Home / Self Care | Payer: BLUE CROSS/BLUE SHIELD | Source: Ambulatory Visit | Attending: Urology | Admitting: Urology

## 2017-08-29 ENCOUNTER — Other Ambulatory Visit: Payer: Self-pay | Admitting: Medical Oncology

## 2017-08-29 ENCOUNTER — Encounter: Payer: Self-pay | Admitting: Urology

## 2017-08-29 VITALS — BP 136/87 | HR 100 | Temp 98.6°F | Resp 16 | Ht 68.0 in | Wt 193.2 lb

## 2017-08-29 DIAGNOSIS — C3412 Malignant neoplasm of upper lobe, left bronchus or lung: Secondary | ICD-10-CM | POA: Diagnosis not present

## 2017-08-29 DIAGNOSIS — Z881 Allergy status to other antibiotic agents status: Secondary | ICD-10-CM | POA: Insufficient documentation

## 2017-08-29 DIAGNOSIS — G8929 Other chronic pain: Secondary | ICD-10-CM | POA: Insufficient documentation

## 2017-08-29 DIAGNOSIS — C787 Secondary malignant neoplasm of liver and intrahepatic bile duct: Secondary | ICD-10-CM | POA: Insufficient documentation

## 2017-08-29 DIAGNOSIS — Z888 Allergy status to other drugs, medicaments and biological substances status: Secondary | ICD-10-CM | POA: Diagnosis not present

## 2017-08-29 DIAGNOSIS — Z79891 Long term (current) use of opiate analgesic: Secondary | ICD-10-CM | POA: Diagnosis not present

## 2017-08-29 DIAGNOSIS — Z79899 Other long term (current) drug therapy: Secondary | ICD-10-CM | POA: Diagnosis not present

## 2017-08-29 DIAGNOSIS — C7931 Secondary malignant neoplasm of brain: Secondary | ICD-10-CM | POA: Insufficient documentation

## 2017-08-29 DIAGNOSIS — M545 Low back pain: Secondary | ICD-10-CM | POA: Diagnosis not present

## 2017-08-29 DIAGNOSIS — R590 Localized enlarged lymph nodes: Secondary | ICD-10-CM | POA: Diagnosis not present

## 2017-08-29 DIAGNOSIS — Z923 Personal history of irradiation: Secondary | ICD-10-CM | POA: Diagnosis not present

## 2017-08-29 DIAGNOSIS — Z9221 Personal history of antineoplastic chemotherapy: Secondary | ICD-10-CM | POA: Diagnosis not present

## 2017-08-29 DIAGNOSIS — R51 Headache: Secondary | ICD-10-CM | POA: Insufficient documentation

## 2017-08-29 DIAGNOSIS — R0602 Shortness of breath: Secondary | ICD-10-CM | POA: Diagnosis not present

## 2017-08-29 DIAGNOSIS — Z880 Allergy status to penicillin: Secondary | ICD-10-CM | POA: Insufficient documentation

## 2017-08-29 DIAGNOSIS — C3492 Malignant neoplasm of unspecified part of left bronchus or lung: Secondary | ICD-10-CM | POA: Insufficient documentation

## 2017-08-29 MED ORDER — FENTANYL 25 MCG/HR TD PT72: 25.0000 ug | MEDICATED_PATCH | TRANSDERMAL | 0 refills | Status: DC

## 2017-08-29 NOTE — Progress Notes (Signed)
Radiation Oncology         [id number]   Name: KAYLIAH TINDOL   Date: 08/29/2017   MRN: [id number]  DOB: [birth year]    Multidisciplinary Brain and Spine Oncology Clinic Follow-Up Visit Note  CC: Hulan Fess, MD  Curt Bears, MD    ICD-10-CM   1. Malignant neoplasm of upper lobe of left lung ([facility]) C34.12   2. Brain metastases ([place]) C79.31   3. 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  Narrative:  The patient returns today for routine follow-up and review of recent repeat brain imaging.  The recent films were presented in our multidisciplinary conference with neuroradiology on 08/28/17.  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 She is currently being treated with systemic chemotherapy with carboplatin, Alimta and Avastin every 3 weeks for metastatic disease to the liver and has completed 5 cycles.  She continues to tolerate her systemic treatments well.  Her most recent systemic imaging with CT C/A/P on 07/26/17 showed overall disease stability.   On review of systems, the patient reports she is doing well in general.  She continues to tolerate her systemic chemotherapy relatively well.  She continues with chronic low back pain felt secondary to OA and DDD (no evidence of skeletal metastasis on PET or CT  imaging) managed with Fentanyl patches and Percocet prn. She denies shooting pain into the buttocks or legs or numbness/tingling in the LEs.  She reports mild shortness of breath on exertion but unchanged recently. She denies chest pain, cough, hemoptysis, night sweats, chills, fever, unintended weight loss, nausea or vomiting. She reports intermittent frontal headaches upon waking that improve over the course of the day and she feels that these are related to sinus/allergy flares. She denies diplopia, blurry vision or tinnitus.                      ALLERGIES:  is allergic to penicillins; chantix [varenicline]; and levaquin [levofloxacin].  Meds: Current Outpatient Medications  Medication Sig Dispense Refill  . benzonatate (TESSALON) 100 MG capsule Take 1 capsule (100 mg total) by mouth every 4 (four) hours as needed for cough. 60 capsule 1  . Calcium-Magnesium-Vitamin D 185-50-100 MG-MG-UNIT CAPS Take 1 tablet by mouth every evening.    . cetirizine (ZYRTEC) 10 MG tablet Take 10 mg by mouth daily.    Marland Kitchen ELDERBERRY PO Take by mouth every morning.    . fentaNYL (DURAGESIC - DOSED MCG/HR) 25 MCG/HR patch Place 1 patch (25 mcg total) onto the skin every 3 (three) days. 5 patch 0  . folic acid (FOLVITE) 1 MG tablet Take 1 tablet (1 mg total) by mouth daily. 30 tablet 4  . guaiFENesin (MUCINEX) 600 MG 12 hr tablet Take 600 mg by mouth at bedtime as  needed.    . Lactobacillus (PROBIOTIC ACIDOPHILUS) CAPS Take by mouth.    . lidocaine-prilocaine (EMLA) cream Apply 1 application topically as needed. 30 g 2  . Milk Thistle 1000 MG CAPS Take 1 capsule by mouth daily.    . Misc Natural Products (TURMERIC CURCUMIN) CAPS Take 1 capsule by mouth every evening. 1500 mg capsule    . Multiple Vitamins-Minerals (WOMENS 50+ MULTI VITAMIN/MIN) TABS Take 1 tablet by mouth daily.    Marland Kitchen OVER THE COUNTER MEDICATION Take 250 mg by mouth daily. Jiaogulan Supplement    . oxyCODONE-acetaminophen (PERCOCET/ROXICET) 5-325 MG  tablet Take 1 tablet by mouth every 4 (four) hours as needed (Chronic pain management). 54 tablet 0  . pantoprazole (PROTONIX) 40 MG tablet Take 1 tablet (40 mg total) 2 (two) times daily by mouth. 180 tablet 3  . polyethylene glycol (MIRALAX / GLYCOLAX) packet Take 17 g by mouth daily.    . pravastatin (PRAVACHOL) 20 MG tablet Take 20 mg by mouth every evening.  3  . albuterol (PROVENTIL HFA;VENTOLIN HFA) 108 (90 Base) MCG/ACT inhaler Inhale 2 puffs into the lungs every 6 (six) hours as needed for wheezing or shortness of breath. (Patient not taking: Reported on 08/29/2017) 1 Inhaler 0  . dexamethasone (DECADRON) 4 MG tablet Take 1 tablet twice a day the day before, day of, and day after each cycle of chemotherapy. (Patient not taking: Reported on 08/29/2017) 30 tablet 1  . diphenhydrAMINE (BENADRYL) 25 mg capsule Take 25 mg by mouth at bedtime as needed.    Nyoka Cowden Tea, Camillia sinensis, (GREEN TEA EXTRACT PO) Take by mouth.    Marland Kitchen LORazepam (ATIVAN) 0.5 MG tablet Take 1 tablet (0.5 mg total) by mouth as needed for anxiety (take 30 min prior to MRI scans and treatments). (Patient not taking: Reported on 08/29/2017) 30 tablet 0  . prochlorperazine (COMPAZINE) 10 MG tablet Take 1 tablet (10 mg total) by mouth every 6 (six) hours as needed for nausea or vomiting. (Patient not taking: Reported on 08/17/2017) 30 tablet 0  . PSYLLIUM HUSK PO Take by mouth.     No current facility-administered medications for this encounter.    Facility-Administered Medications Ordered in Other Encounters  Medication Dose Route Frequency Provider Last Rate Last Dose  . sodium chloride flush (NS) 0.9 % injection 10 mL  10 mL Intracatheter PRN Curt Bears, MD        Physical Findings:  height is 5\' 8"  (1.727 m) and weight is 193 lb 3.2 oz (87.6 kg). Her oral temperature is 98.6 F (37 C). Her blood pressure is 136/87 and her pulse is 100. Her respiration is 16 and oxygen saturation is 96%.   In general this is a well  appearing caucasian female in no acute distress. She's alert and oriented x4 and appropriate throughout the examination. Cardiopulmonary assessment is negative for acute distress and she exhibits normal effort. She appears grossly neurologically intact.  Speech is fluent and appropriate.  EOMs intact bilaterally and PERRLA. Strength is 5/5 and equal in bilateral upper and lower extremities.  Sensation is intact to light tough in the upper and lower extremities. Gait is normal.   Lab Findings: Lab Results  Component Value Date   WBC 3.3 (L) 08/24/2017   HGB 12.5 08/24/2017   HCT 37.6 08/24/2017   MCV 100.5 08/24/2017   PLT 170 08/24/2017    @LASTCHEM @  Radiographic Findings: Mr Jeri Cos QI Contrast  Result Date: 08/25/2017 CLINICAL DATA:  Stereotactic radiosurgery planning.  Stage 3 non-small cell lung carcinoma. Hepatic metastases and suspected brain metastases. EXAM: MRI HEAD WITHOUT AND WITH CONTRAST TECHNIQUE: Multiplanar, multiecho pulse sequences of the brain and surrounding structures were obtained without and with intravenous contrast. CONTRAST:  51mL MULTIHANCE GADOBENATE DIMEGLUMINE 529 MG/ML IV SOLN COMPARISON:  Brain MRI 06/08/2017, 05/02/2017 FINDINGS: BRAIN New Lesions: None. Larger lesions: None. Stable or Smaller lesions: 1. 4 mm enhancing lesion located in the left cerebellar hemisphere and seen on series 11, image 37. 2. 4 mm enhancing lesion located in the medial right cerebellar hemisphere and seen on series 11, image 34. The degree of contrast enhancement is less than on the previous study. Other Brain findings: Partially empty sella. There is no acute infarct or acute hemorrhage. The brain parenchymal signal is normal for the patient's age. No age-advanced or lobar predominant atrophy. No chronic microhemorrhage or superficial siderosis. VASCULAR: Major intracranial arterial and venous sinus flow voids are preserved. 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 SKULL  AND UPPER CERVICAL SPINE: The visualized skull base, calvarium, upper cervical spine and extracranial soft tissues are normal. SINUSES/ORBITS: No fluid levels or advanced mucosal thickening. No mastoid or middle ear effusion. Normal orbits. IMPRESSION: 1. Unchanged disease as evidenced by unchanged size of 2 cerebellar metastases. 2. Total of 2 enhancing brain metastases, each annotated on 11. 3. No other focal or acute abnormality. Electronically Signed   By: Ulyses Jarred M.D.   On: 08/25/2017 13:42    Impression/Plan:  ZIANA HEYLIGER is a 52 y.o. woman with two subcentimeter small cerebellar lesions which appear consistent with metastases. Given their stability with systemic therapy, the plan is to continue to observe them rather than proceed with intervention. We will repeat Brain MRI in three months then have her return for a follow up visit thereafter to review the results.  She knows to call at any time in the interim should she have any questions or concerns.  She is comfortable with and in agreement with this plan.     Nicholos Johns, PA-C

## 2017-08-31 ENCOUNTER — Inpatient Hospital Stay: Payer: BLUE CROSS/BLUE SHIELD | Attending: Hematology & Oncology

## 2017-08-31 DIAGNOSIS — K219 Gastro-esophageal reflux disease without esophagitis: Secondary | ICD-10-CM | POA: Diagnosis not present

## 2017-08-31 DIAGNOSIS — Z79899 Other long term (current) drug therapy: Secondary | ICD-10-CM | POA: Insufficient documentation

## 2017-08-31 DIAGNOSIS — Z923 Personal history of irradiation: Secondary | ICD-10-CM | POA: Diagnosis not present

## 2017-08-31 DIAGNOSIS — Z5111 Encounter for antineoplastic chemotherapy: Secondary | ICD-10-CM | POA: Diagnosis not present

## 2017-08-31 DIAGNOSIS — J00 Acute nasopharyngitis [common cold]: Secondary | ICD-10-CM | POA: Diagnosis not present

## 2017-08-31 DIAGNOSIS — E785 Hyperlipidemia, unspecified: Secondary | ICD-10-CM | POA: Insufficient documentation

## 2017-08-31 DIAGNOSIS — C787 Secondary malignant neoplasm of liver and intrahepatic bile duct: Secondary | ICD-10-CM | POA: Diagnosis not present

## 2017-08-31 DIAGNOSIS — C3492 Malignant neoplasm of unspecified part of left bronchus or lung: Secondary | ICD-10-CM

## 2017-08-31 DIAGNOSIS — Z5112 Encounter for antineoplastic immunotherapy: Secondary | ICD-10-CM | POA: Diagnosis not present

## 2017-08-31 DIAGNOSIS — C3412 Malignant neoplasm of upper lobe, left bronchus or lung: Secondary | ICD-10-CM | POA: Insufficient documentation

## 2017-08-31 LAB — COMPREHENSIVE METABOLIC PANEL
ALBUMIN: 3.7 g/dL (ref 3.5–5.0)
ALK PHOS: 91 U/L (ref 40–150)
ALT: 28 U/L (ref 0–55)
AST: 31 U/L (ref 5–34)
Anion gap: 7 (ref 3–11)
BUN: 6 mg/dL — ABNORMAL LOW (ref 7–26)
CALCIUM: 9.8 mg/dL (ref 8.4–10.4)
CHLORIDE: 104 mmol/L (ref 98–109)
CO2: 29 mmol/L (ref 22–29)
CREATININE: 0.73 mg/dL (ref 0.60–1.10)
GFR calc non Af Amer: >60 mL/min (ref >60)
GLUCOSE: 85 mg/dL (ref 70–140)
Potassium: 4.7 mmol/L (ref 3.5–5.1)
Sodium: 140 mmol/L (ref 136–145)
Total Bilirubin: 0.4 mg/dL (ref 0.2–1.2)
Total Protein: 7.1 g/dL (ref 6.4–8.3)

## 2017-08-31 LAB — CBC WITH DIFFERENTIAL/PLATELET
BASOS ABS: 0 10*3/uL (ref 0.0–0.1)
BASOS PCT: 0 %
EOS ABS: 0 10*3/uL (ref 0.0–0.5)
EOS PCT: 1 %
HCT: 39.4 % (ref 34.8–46.6)
HEMOGLOBIN: 13.2 g/dL (ref 11.6–15.9)
Lymphocytes Relative: 27 %
Lymphs Abs: 1.3 10*3/uL (ref 0.9–3.3)
MCH: 34 pg (ref 26.0–34.0)
MCHC: 33.5 g/dL (ref 32.0–36.0)
MCV: 101.5 fL — ABNORMAL HIGH (ref 81.0–101.0)
Monocytes Absolute: 0.8 10*3/uL (ref 0.1–0.9)
Monocytes Relative: 16 %
NEUTROS PCT: 56 %
Neutro Abs: 2.6 10*3/uL (ref 1.5–6.5)
PLATELETS: 149 10*3/uL (ref 145–400)
RBC: 3.88 MIL/uL (ref 3.70–5.32)
RDW: 14.5 % (ref 11.1–15.7)
WBC: 4.7 10*3/uL (ref 3.9–10.0)

## 2017-09-04 ENCOUNTER — Other Ambulatory Visit: Payer: Self-pay | Admitting: Medical Oncology

## 2017-09-04 DIAGNOSIS — R590 Localized enlarged lymph nodes: Secondary | ICD-10-CM

## 2017-09-04 MED ORDER — OXYCODONE-ACETAMINOPHEN 5-325 MG PO TABS: 1.0000 | ORAL_TABLET | ORAL | 0 refills | Status: DC | PRN

## 2017-09-07 ENCOUNTER — Inpatient Hospital Stay: Payer: BLUE CROSS/BLUE SHIELD

## 2017-09-07 ENCOUNTER — Inpatient Hospital Stay (HOSPITAL_BASED_OUTPATIENT_CLINIC_OR_DEPARTMENT_OTHER): Payer: BLUE CROSS/BLUE SHIELD | Admitting: Oncology

## 2017-09-07 ENCOUNTER — Encounter: Payer: Self-pay | Admitting: Oncology

## 2017-09-07 ENCOUNTER — Other Ambulatory Visit: Payer: Self-pay | Admitting: Internal Medicine

## 2017-09-07 ENCOUNTER — Telehealth: Payer: Self-pay | Admitting: Internal Medicine

## 2017-09-07 ENCOUNTER — Telehealth: Payer: Self-pay | Admitting: Oncology

## 2017-09-07 VITALS — BP 137/93 | HR 96 | Temp 98.5°F | Resp 17 | Ht 68.0 in | Wt 194.5 lb

## 2017-09-07 VITALS — BP 125/94 | HR 92 | Temp 98.6°F | Resp 18

## 2017-09-07 DIAGNOSIS — E785 Hyperlipidemia, unspecified: Secondary | ICD-10-CM | POA: Diagnosis not present

## 2017-09-07 DIAGNOSIS — K219 Gastro-esophageal reflux disease without esophagitis: Secondary | ICD-10-CM | POA: Diagnosis not present

## 2017-09-07 DIAGNOSIS — Z923 Personal history of irradiation: Secondary | ICD-10-CM | POA: Diagnosis not present

## 2017-09-07 DIAGNOSIS — C3492 Malignant neoplasm of unspecified part of left bronchus or lung: Secondary | ICD-10-CM

## 2017-09-07 DIAGNOSIS — C787 Secondary malignant neoplasm of liver and intrahepatic bile duct: Secondary | ICD-10-CM | POA: Diagnosis not present

## 2017-09-07 DIAGNOSIS — Z79899 Other long term (current) drug therapy: Secondary | ICD-10-CM

## 2017-09-07 DIAGNOSIS — C3412 Malignant neoplasm of upper lobe, left bronchus or lung: Secondary | ICD-10-CM | POA: Diagnosis not present

## 2017-09-07 DIAGNOSIS — J00 Acute nasopharyngitis [common cold]: Secondary | ICD-10-CM

## 2017-09-07 DIAGNOSIS — Z95828 Presence of other vascular implants and grafts: Secondary | ICD-10-CM

## 2017-09-07 DIAGNOSIS — Z5111 Encounter for antineoplastic chemotherapy: Secondary | ICD-10-CM

## 2017-09-07 LAB — COMPREHENSIVE METABOLIC PANEL
ALT: 34 U/L (ref 0–55)
AST: 32 U/L (ref 5–34)
Albumin: 4 g/dL (ref 3.5–5.0)
Alkaline Phosphatase: 81 U/L (ref 40–150)
Anion gap: 12 — ABNORMAL HIGH (ref 3–11)
BILIRUBIN TOTAL: 0.5 mg/dL (ref 0.2–1.2)
BUN: 8 mg/dL (ref 7–26)
CALCIUM: 10 mg/dL (ref 8.4–10.4)
CO2: 22 mmol/L (ref 22–29)
CREATININE: 0.74 mg/dL (ref 0.60–1.10)
Chloride: 104 mmol/L (ref 98–109)
GFR calc Af Amer: >60 mL/min (ref >60)
Glucose, Bld: 117 mg/dL (ref 70–140)
Potassium: 4 mmol/L (ref 3.5–5.1)
Sodium: 138 mmol/L (ref 136–145)
TOTAL PROTEIN: 7.5 g/dL (ref 6.4–8.3)

## 2017-09-07 LAB — CBC WITH DIFFERENTIAL/PLATELET
BASOS ABS: 0 10*3/uL (ref 0.0–0.1)
Basophils Relative: 0 %
EOS PCT: 0 %
Eosinophils Absolute: 0 10*3/uL (ref 0.0–0.5)
HEMATOCRIT: 38.6 % (ref 34.8–46.6)
Hemoglobin: 13 g/dL (ref 11.6–15.9)
LYMPHS PCT: 11 %
Lymphs Abs: 0.7 10*3/uL — ABNORMAL LOW (ref 0.9–3.3)
MCH: 33.7 pg (ref 25.1–34.0)
MCHC: 33.7 g/dL (ref 31.5–36.0)
MCV: 100 fL (ref 79.5–101.0)
MONO ABS: 0.9 10*3/uL (ref 0.1–0.9)
Monocytes Relative: 14 %
NEUTROS ABS: 4.9 10*3/uL (ref 1.5–6.5)
Neutrophils Relative %: 75 %
PLATELETS: 286 10*3/uL (ref 145–400)
RBC: 3.86 MIL/uL (ref 3.70–5.45)
RDW: 14.7 % — ABNORMAL HIGH (ref 11.2–14.5)
WBC: 6.5 10*3/uL (ref 3.9–10.3)

## 2017-09-07 LAB — TOTAL PROTEIN, URINE DIPSTICK: PROTEIN: NEGATIVE mg/dL

## 2017-09-07 MED ORDER — FAMOTIDINE IN NACL 20-0.9 MG/50ML-% IV SOLN
20.0000 mg | Freq: Once | INTRAVENOUS | Status: AC
Start: 2017-09-07 — End: 2017-09-07
  Administered 2017-09-07: 20 mg via INTRAVENOUS

## 2017-09-07 MED ORDER — METHYLPREDNISOLONE SODIUM SUCC 125 MG IJ SOLR
125.0000 mg | Freq: Once | INTRAMUSCULAR | Status: AC | PRN
  Administered 2017-09-07: 125 mg via INTRAVENOUS

## 2017-09-07 MED ORDER — PALONOSETRON HCL INJECTION 0.25 MG/5ML
INTRAVENOUS | Status: AC
  Filled 2017-09-07: qty 5

## 2017-09-07 MED ORDER — HEPARIN SOD (PORK) LOCK FLUSH 100 UNIT/ML IV SOLN
500.0000 [IU] | Freq: Once | INTRAVENOUS | Status: AC | PRN
  Administered 2017-09-07: 500 [IU]
  Filled 2017-09-07: qty 5

## 2017-09-07 MED ORDER — DIPHENHYDRAMINE HCL 50 MG/ML IJ SOLN
INTRAMUSCULAR | Status: AC
  Filled 2017-09-07: qty 1

## 2017-09-07 MED ORDER — PALONOSETRON HCL INJECTION 0.25 MG/5ML
0.2500 mg | Freq: Once | INTRAVENOUS | Status: AC
  Administered 2017-09-07: 0.25 mg via INTRAVENOUS

## 2017-09-07 MED ORDER — SODIUM CHLORIDE 0.9 % IV SOLN
480.0000 mg/m2 | Freq: Once | INTRAVENOUS | Status: AC
  Administered 2017-09-07: 1000 mg via INTRAVENOUS
  Filled 2017-09-07: qty 40

## 2017-09-07 MED ORDER — FOSAPREPITANT DIMEGLUMINE INJECTION 150 MG
Freq: Once | INTRAVENOUS | Status: AC
  Administered 2017-09-07: 11:00:00 via INTRAVENOUS
  Filled 2017-09-07: qty 5

## 2017-09-07 MED ORDER — SODIUM CHLORIDE 0.9 % IV SOLN
15.5000 mg/kg | Freq: Once | INTRAVENOUS | Status: AC
  Administered 2017-09-07: 1400 mg via INTRAVENOUS
  Filled 2017-09-07: qty 48

## 2017-09-07 MED ORDER — SODIUM CHLORIDE 0.9 % IV SOLN
700.0000 mg | Freq: Once | INTRAVENOUS | Status: AC
  Administered 2017-09-07: 700 mg via INTRAVENOUS
  Filled 2017-09-07: qty 70

## 2017-09-07 MED ORDER — DIPHENHYDRAMINE HCL 50 MG/ML IJ SOLN
50.0000 mg | Freq: Once | INTRAMUSCULAR | Status: AC
  Administered 2017-09-07: 50 mg via INTRAVENOUS

## 2017-09-07 MED ORDER — FAMOTIDINE IN NACL 20-0.9 MG/50ML-% IV SOLN
20.0000 mg | Freq: Once | INTRAVENOUS | Status: AC | PRN
  Administered 2017-09-07: 20 mg via INTRAVENOUS

## 2017-09-07 MED ORDER — SODIUM CHLORIDE 0.9% FLUSH
10.0000 mL | INTRAVENOUS | Status: DC | PRN
  Administered 2017-09-07: 10 mL
  Filled 2017-09-07: qty 10

## 2017-09-07 MED ORDER — FAMOTIDINE IN NACL 20-0.9 MG/50ML-% IV SOLN
INTRAVENOUS | Status: AC
  Filled 2017-09-07: qty 50

## 2017-09-07 MED ORDER — SODIUM CHLORIDE 0.9 % IV SOLN
Freq: Once | INTRAVENOUS | Status: AC
  Administered 2017-09-07: 10:00:00 via INTRAVENOUS

## 2017-09-07 NOTE — Progress Notes (Signed)
1300- pt receiving carboplatin and pt complaint of flushed feeling.  This RN noticed pt face to very red and flushed.  Infusion stopped and NS wide open.  Sandi Mealy, Pa notified and orders received to administer 20 mg IV pepcid and 125 mg Iv solumedrol.  Pt VSS and documented in epic.   1315- pt states flushing sensation has gone away  no longer flushed in face and VSS.  Verbal orders received from Sandi Mealy to re start Carboplatin in 15 minutes.  1330- VSS, pt asymptomatic and Carboplatin restarted.  1351- carboplatin infusion completed, VSS, pt asymptomatic and discharged home with instructions to call cancer center if she experiences any side effects while at home.

## 2017-09-07 NOTE — Progress Notes (Addendum)
[facility] OFFICE PROGRESS NOTE  Debbie Fess, MD [place] [address]  DIAGNOSIS:  Stage IIIA (T2a, N2, M0) non-small cell lung cancer, adenocarcinoma presented with left suprahilar mass, mediastinal lymphadenopathy and suspicious pulmonary nodule in the left upper lobe diagnosed in November 2017. No actionable mutations on [employer] testing.  Biomarker Findings Microsatellite Status - MS-Stable Tumor Mutational Burden - TMB-Low (3 Muts/Mb) Genomic Findings For a complete list of the genes assayed, please refer to the Appendix. STK11 loss exons 1 AXL amplification - equivocal? HGF amplification - equivocal? KRAS L19F RICTOR amplification CHEK2 T334f*15 FGF10 amplification TP53 Q136P 7 Disease relevant genes with no reportable alterations: RET, ROS1, ALK, BRAF, ERBB2, MET, EGFR  PDL1 Expression 0%.  PRIOR THERAPY: 1) Status post concurrent chemoradiation with weekly carboplatin for AUC of 2 and paclitaxel 45 MG/M2 for 7 cycles. Last dose was given 06/13/2016. 2) Immunotherapy with Imfinzi (Durvalumab) 10 MG/KG every 2 weeks. First dose 08/11/2016. Status post 18 cycles.  Discontinued secondary to disease progression.  CURRENT THERAPY: Systemic chemotherapy with carboplatin for AUC of 5, Alimta 500 mg/M2 and Avastin 15 mg/KG every 3 weeks status post 5 cycles.  INTERVAL HISTORY: Debbie DENNY52y.o. female returns for routine follow-up visit by herself.  Patient is feeling fine today with no specific complaints except for getting over a head cold.  He continues to have pain which is fairly well controlled with fentanyl patch and Percocet.  Patient denies fevers and chills.  Denies chest pain, shortness breath, hemoptysis.  She has an ongoing nonproductive cough and uses Tessalon.  Denies nausea, vomiting, constipation, diarrhea.  The patient continues to tolerate her treatment with carboplatin, Alimta and Avastin fairly well.  The patient is  here for evaluation prior to cycle #6 of her treatment.  MEDICAL HISTORY: Past Medical History:  Diagnosis Date  . Allergic rhinitis   . Deaf, left   . Diverticulosis    sigmoid colon  . Dyspnea   . Encounter for antineoplastic chemotherapy 04/18/2016  . Encounter for antineoplastic immunotherapy 07/21/2016  . GERD (gastroesophageal reflux disease)   . Hyperlipidemia   . [place] [place]   non small cell lung cancer favoring adenocarcinoma   . Lung nodules   . Migraine   . Odynophagia 05/24/2016  . Pneumonia   . PONV (postoperative nausea and vomiting)   . Tachycardia, paroxysmal ([facility] 09/08/2016  . Wears glasses     ALLERGIES:  is allergic to penicillins and chantix [varenicline].  MEDICATIONS:  Current Outpatient Medications  Medication Sig Dispense Refill  . albuterol (PROVENTIL HFA;VENTOLIN HFA) 108 (90 Base) MCG/ACT inhaler Inhale 2 puffs into the lungs every 6 (six) hours as needed for wheezing or shortness of breath. 1 Inhaler 0  . benzonatate (TESSALON) 100 MG capsule Take 1 capsule (100 mg total) by mouth every 4 (four) hours as needed for cough. 60 capsule 1  . Calcium-Magnesium-Vitamin D 185-50-100 MG-MG-UNIT CAPS Take 1 tablet by mouth every evening.    . cetirizine (ZYRTEC) 10 MG tablet Take 10 mg by mouth daily.    .Marland Kitchendexamethasone (DECADRON) 4 MG tablet Take 1 tablet twice a day the day before, day of, and day after each cycle of chemotherapy. 30 tablet 1  . diphenhydrAMINE (BENADRYL) 25 mg capsule Take 25 mg by mouth at bedtime as needed.    .Marland KitchenELDERBERRY PO Take by mouth every morning.    . fentaNYL (DURAGESIC - DOSED MCG/HR) 25 MCG/HR patch Place 1  patch (25 mcg total) onto the skin every 3 (three) days. 5 patch 0  . folic acid (FOLVITE) 1 MG tablet Take 1 tablet (1 mg total) by mouth daily. 30 tablet 4  . guaiFENesin (MUCINEX) 600 MG 12 hr tablet Take 600 mg by mouth at bedtime as needed.    . Lactobacillus (PROBIOTIC ACIDOPHILUS) CAPS Take by mouth.     . lidocaine-prilocaine (EMLA) cream Apply 1 application topically as needed. 30 g 2  . Milk Thistle 1000 MG CAPS Take 1 capsule by mouth daily.    . Misc Natural Products (TURMERIC CURCUMIN) CAPS Take 1 capsule by mouth every evening. 1500 mg capsule    . Multiple Vitamins-Minerals (WOMENS 50+ MULTI VITAMIN/MIN) TABS Take 1 tablet by mouth daily.    Marland Kitchen OVER THE COUNTER MEDICATION Take 250 mg by mouth daily. Jiaogulan Supplement    . oxyCODONE-acetaminophen (PERCOCET/ROXICET) 5-325 MG tablet Take 1 tablet by mouth every 4 (four) hours as needed (Chronic pain management). 60 tablet 0  . pantoprazole (PROTONIX) 40 MG tablet Take 1 tablet (40 mg total) 2 (two) times daily by mouth. 180 tablet 3  . pravastatin (PRAVACHOL) 20 MG tablet Take 20 mg by mouth every evening.  3  . PSYLLIUM HUSK PO Take 1 capsule by mouth 2 (two) times daily.     Marland Kitchen LORazepam (ATIVAN) 0.5 MG tablet Take 1 tablet (0.5 mg total) by mouth as needed for anxiety (take 30 min prior to MRI scans and treatments). (Patient not taking: Reported on 08/29/2017) 30 tablet 0  . prochlorperazine (COMPAZINE) 10 MG tablet Take 1 tablet (10 mg total) by mouth every 6 (six) hours as needed for nausea or vomiting. (Patient not taking: Reported on 08/17/2017) 30 tablet 0   No current facility-administered medications for this visit.    Facility-Administered Medications Ordered in Other Visits  Medication Dose Route Frequency Provider Last Rate Last Dose  . sodium chloride flush (NS) 0.9 % injection 10 mL  10 mL Intracatheter PRN Curt Bears, MD        SURGICAL HISTORY:  Past Surgical History:  Procedure Laterality Date  . ABDOMINAL HYSTERECTOMY    . IR FLUORO GUIDE PORT INSERTION RIGHT  06/26/2017  . IR US GUIDE VASC ACCESS RIGHT  06/26/2017  . VIDEO BRONCHOSCOPY Bilateral 07/03/2015   Procedure: VIDEO BRONCHOSCOPY WITH FLUORO;  Surgeon: Marshell Garfinkel, MD;  Location: [facility];  Service: Cardiopulmonary;  Laterality: Bilateral;  . VIDEO  BRONCHOSCOPY WITH ENDOBRONCHIAL ULTRASOUND N/A 04/06/2016   Procedure: VIDEO BRONCHOSCOPY WITH ENDOBRONCHIAL ULTRASOUND;  Surgeon: Collene Gobble, MD;  Location: [facility];  Service: Thoracic;  Laterality: N/A;  . WRIST FRACTURE SURGERY      REVIEW OF SYSTEMS:   Review of Systems  Constitutional: Negative for appetite change, chills, fatigue, fever and unexpected weight change.  HENT:   Negative for mouth sores, nosebleeds, sore throat and trouble swallowing.   Eyes: Negative for eye problems and icterus.  Respiratory: Negative for hemoptysis, shortness of breath and wheezing.  Positive for nonproductive cough.  Cardiovascular: Negative for chest pain and leg swelling.  Gastrointestinal: Negative for abdominal pain, constipation, diarrhea, nausea and vomiting.  Genitourinary: Negative for bladder incontinence, difficulty urinating, dysuria, frequency and hematuria.   Musculoskeletal: Negative for gait problem, neck pain and neck stiffness. Positive for back pain which is controlled with fentanyl and Percocet. Skin: Negative for itching and rash.  Neurological: Negative for dizziness, extremity weakness, gait problem, headaches, light-headedness and seizures.  Hematological: Negative for adenopathy. Does not  bruise/bleed easily.  Psychiatric/Behavioral: Negative for confusion, depression and sleep disturbance. The patient is not nervous/anxious.     PHYSICAL EXAMINATION:  Blood pressure (!) 137/93, pulse 96, temperature 98.5 F (36.9 C), temperature source Oral, resp. rate 17, height 5' 8"  (1.727 m), weight 194 lb 8 oz (88.2 kg), SpO2 96 %.  ECOG PERFORMANCE STATUS: 1 - Symptomatic but completely ambulatory  Physical Exam  Constitutional: Oriented to person, place, and time and well-developed, well-nourished, and in no distress. No distress.  HENT:  Head: Normocephalic and atraumatic.  Mouth/Throat: Oropharynx is clear and moist. No oropharyngeal exudate.  Eyes: Conjunctivae are normal.  Right eye exhibits no discharge. Left eye exhibits no discharge. No scleral icterus.  Neck: Normal range of motion. Neck supple.  Cardiovascular: Normal rate, regular rhythm, normal heart sounds and intact distal pulses.   Pulmonary/Chest: Effort normal and breath sounds normal. No respiratory distress. No wheezes. No rales.  Abdominal: Soft. Bowel sounds are normal. Exhibits no distension and no mass. There is no tenderness.  Musculoskeletal: Normal range of motion. Exhibits no edema.  Lymphadenopathy:    No cervical adenopathy.  Neurological: Alert and oriented to person, place, and time. Exhibits normal muscle tone. Gait normal. Coordination normal.  Skin: Skin is warm and dry. No rash noted. Not diaphoretic. No erythema. No pallor.  Psychiatric: Mood, memory and judgment normal.  Vitals reviewed.  LABORATORY DATA: Lab Results  Component Value Date   WBC 6.5 09/07/2017   HGB 13.0 09/07/2017   HCT 38.6 09/07/2017   MCV 100.0 09/07/2017   PLT 286 09/07/2017      Chemistry      Component Value Date/Time   NA 138 09/07/2017 0838   NA 140 05/22/2017 1212   K 4.0 09/07/2017 0838   K 4.2 05/22/2017 1212   CL 104 09/07/2017 0838   CO2 22 09/07/2017 0838   CO2 21 (L) 05/22/2017 1212   BUN 8 09/07/2017 0838   BUN 10.3 05/22/2017 1212   CREATININE 0.74 09/07/2017 0838   CREATININE 0.8 05/22/2017 1212      Component Value Date/Time   CALCIUM 10.0 09/07/2017 0838   CALCIUM 9.6 05/22/2017 1212   ALKPHOS 81 09/07/2017 0838   ALKPHOS 113 05/22/2017 1212   AST 32 09/07/2017 0838   AST 17 05/22/2017 1212   ALT 34 09/07/2017 0838   ALT 27 05/22/2017 1212   BILITOT 0.5 09/07/2017 0838   BILITOT 0.31 05/22/2017 1212       RADIOGRAPHIC STUDIES:  Mr Jeri Cos CL Contrast  Result Date: 08/25/2017 CLINICAL DATA:  Stereotactic radiosurgery planning. Stage 3 non-small cell lung carcinoma. Hepatic metastases and suspected brain metastases. EXAM: MRI HEAD WITHOUT AND WITH CONTRAST  TECHNIQUE: Multiplanar, multiecho pulse sequences of the brain and surrounding structures were obtained without and with intravenous contrast. CONTRAST:  64m MULTIHANCE GADOBENATE DIMEGLUMINE 529 MG/ML IV SOLN COMPARISON:  Brain MRI 06/08/2017, 05/02/2017 FINDINGS: BRAIN New Lesions: None. Larger lesions: None. Stable or Smaller lesions: 1. 4 mm enhancing lesion located in the left cerebellar hemisphere and seen on series 11, image 37. 2. 4 mm enhancing lesion located in the medial right cerebellar hemisphere and seen on series 11, image 34. The degree of contrast enhancement is less than on the previous study. Other Brain findings: Partially empty sella. There is no acute infarct or acute hemorrhage. The brain parenchymal signal is normal for the patient's age. No age-advanced or lobar predominant atrophy. No chronic microhemorrhage or superficial siderosis. VASCULAR: Major intracranial arterial and venous  sinus flow voids are preserved. 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 SKULL AND UPPER CERVICAL SPINE: The visualized skull base, calvarium, upper cervical spine and extracranial soft tissues are normal. SINUSES/ORBITS: No fluid levels or advanced mucosal thickening. No mastoid or middle ear effusion. Normal orbits. IMPRESSION: 1. Unchanged disease as evidenced by unchanged size of 2 cerebellar metastases. 2. Total of 2 enhancing brain metastases, each annotated on 11. 3. No other focal or acute abnormality. Electronically Signed   By: Ulyses Jarred M.D.   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 The patient is currently undergoing consolidation immunotherapy with Imfinzi (Durvalumab) status post 18 cycles. This treatment was discontinued  secondary to disease progression and new liver metastasis. Her molecular studies showed no evidence for actionable mutations. The patient was started on systemic chemotherapy with carboplatin, Alimta and Avastin status post 5 cycles.   She continues to tolerate this treatment well. I recommended for her to proceed with cycle #6 today as a scheduled. The patient will have a restaging CT scan of the chest, abdomen, pelvis in approximately 3 weeks.  She will follow-up 1-2 days after the CT scan to discuss the results and treatment options.  For pain management, she will continue on a fentanyl patch and Percocet for breakthrough pain. For the cough, she will continue Tessalon as needed.  She was advised to call immediately if she has any concerning symptoms in the interval. The patient voices understanding of current disease status and treatment options and is in agreement with the current care plan. All questions were answered. The patient knows to call the clinic with any problems, questions or concerns. We can certainly see the patient much sooner if necessary.   Orders Placed This Encounter  Procedures  . CT CHEST W CONTRAST    Standing Status:   Future    Standing Expiration Date:   09/08/2018    Order Specific Question:   If indicated for the ordered procedure, I authorize the administration of contrast media per Radiology protocol    Answer:   Yes    Order Specific Question:   Preferred imaging location?    Answer:   [facility]    Order Specific Question:   Radiology Contrast Protocol - do NOT remove file path    Answer:   \\[email or web address]    Order Specific Question:   Reason for Exam additional comments    Answer:   metastatic lung cancer. Restaging.    Order Specific Question:   Is patient pregnant?    Answer:   No  . CT ABDOMEN PELVIS W CONTRAST    Standing Status:   Future    Standing Expiration Date:   09/08/2018    Order Specific Question:    If indicated for the ordered procedure, I authorize the administration of contrast media per Radiology protocol    Answer:   Yes    Order Specific Question:   Preferred imaging location?    Answer:   [facility]    Order Specific Question:   Radiology Contrast Protocol - do NOT remove file path    Answer:   \\[email or web address]    Order Specific Question:   Reason for Exam additional comments    Answer:   metastatic lung  cancer. Restaging.    Order Specific Question:   Is patient pregnant?    Answer:   No   Mikey Bussing, DNP, AGPCNP-BC, AOCNP 09/07/17     DATE: 09/07/2017  TIME: 1300   _X_ CHEMO/IMMUNOTHERAPY REACTION        ___ TRANSFUSION REACTION  NAME:  Tove Wideman MR#:  [id number] DOB:  1966/01/21  MD: ___  Burr Medico ___  Alvy Bimler ___  Lindi Adie ___  Irene Limbo  ___  Magrinat _X_  Mohamed  ___  Lebron Conners ___ Alen Blew  ___ Benay Spice ___  Vaslow   AGENT/BLOOD PRODUCT RECEIVED TODAY:   Alimta, Avastin, and carboplatin  AGENT/BLOOD PRODUCT RECEIVED IMMEDIATELY PRIOR TO REACTION: Carboplatin (cycle #8)  REACTION(S):  ___  Chest Pain (Pain Scale ___/10, Location _______________ , Radiation: Yes / No, SOB Yes / No,  Diaphoresis: Yes / No)  ___  Chills  ___  Diaphoresis    ___  Diarrhea   ___  Fever  _X_ Flushing ___  Hot Flash ___ Hypertension ___  MS Changes  ___   Nausea  ___  Pain (Location: ___________________ )   ___  Rash   ___  Rigors  ___  Visual Disturbance (Description: __________________ )    ___  Vomiting  ___   Slurred Speech  ___ Other   __________________________________________________________  PREMEDS:   _X_  Aloxi    ___  APAP    (___ mg)     ___  Ativan    (___ mg)   _X_  Benadryl   (_50 mg, _X_ IV ___ PO) ___  Compazine   (___ mg)    ___  Dexamethasone  (____ mg, ___ IV ___ PO)    _X_  Emend ___  Zofran    (___ mg)  _X_  Pepcid   ( 20 mg )    ___  Prednisone   (___ mg)    ___  No Premeds  Other:  _____________________________________________________________________________       INTERVENTION:   ___   Albuterol Nebulizer   (2.5 mg/3 ml)   x  ____   Time: ______  ___ APAP    (___ mg Po)     Time: ______  ___   Ativan     (___ mg)  x  ____    Time: ______     Time: ______ ___   Benadryl    (____ mg, ___ IV ___ PO)  x  ____  Time: ______     Time: ______ ___   Demerol    (____ mg) x ____    Time: ______    Time:  ______ ___   Dexamethasone   (____ mg, ___ IV ___ PO)  x  ____  Time: ______     Time: ______ ___   Epinephrine    0.5 mg Sterling x 1     Time: ______ ___   Epinephrine    0.25 mg of 1 mg/10 ML x 1   Time: ______ ___  Morphine Sulfate   (____ mg)  x  ____    Time: ______     Time: ______ _X_   Pepcid    20 mg)  x  _1_     Time: ______     Time: ______ _X_   Solumedrol    (_125_ mg)  x  ____    Time: ______     Time: ______ ___   Supplemental Oxygen   Review of Systems:  Review of Systems  Constitutional:  Negative for chills and diaphoresis.  HENT: Negative for trouble swallowing.   Respiratory: Negative for cough, choking, chest tightness, shortness of breath and wheezing.   Cardiovascular: Negative for chest pain and palpitations.  Skin:       Flushing and facial erythema  Neurological: Negative for tremors, weakness and headaches.    Physical Exam:  BP (!) 137/93 (BP Location: Left Arm, Patient Position: Sitting)   Pulse 96   Temp 98.5 F (36.9 C) (Oral)   Resp 17   Ht 5' 8"  (1.727 m)   Wt 194 lb 8 oz (88.2 kg)   LMP  (LMP Unknown)   SpO2 96%   BMI 29.57 kg/m   Physical Exam  HENT:  Head: Normocephalic and atraumatic.  Mild erythema of the cheeks.  Cardiovascular: Normal rate, regular rhythm and normal heart sounds. Exam reveals no gallop and no friction rub.  No murmur heard. Pulmonary/Chest: Effort normal and breath sounds normal. No respiratory distress. She has no wheezes.  Skin: There is erythema (Erythema of the cheeks which resolved after repeat dosing  with Pepcid and with Solu-Medrol 125 mg IV.).    ___   RAPID RESPONSE CALLED ___   CODE BLUE CALLED  OUTCOME: _X_   Patient responded to intervention  _X_ Chemo/Immunotherapy restarted and completed  ___ Chemotherapy ____________________ terminated  ___  Patient transported to ER for management  ___  Other: ____________________________________________________________________________  Primary Oncologist or On-call Provider notified:  _X_ Yes ___ No   Sandi Mealy, MHS, PA-C

## 2017-09-07 NOTE — Telephone Encounter (Signed)
Spoke with patient regarding appts that were added per 4/11 sch msg

## 2017-09-07 NOTE — Assessment & Plan Note (Addendum)
This is a very pleasant 52 year old white female with unresectable a stage IIIA non-small cell lung cancer, adenocarcinoma status post a course of concurrent chemoradiation with partial response. The patient is currently undergoing consolidation immunotherapy with Imfinzi (Durvalumab) status post 18 cycles. This treatment was discontinued secondary to disease progression and new liver metastasis. Her molecular studies showed no evidence for actionable mutations. The patient was started on systemic chemotherapy with carboplatin, Alimta and Avastin status post 5 cycles.   She continues to tolerate this treatment well. I recommended for her to proceed with cycle #6 today as a scheduled. The patient will have a restaging CT scan of the chest, abdomen, pelvis in approximately 3 weeks.  She will follow-up 1-2 days after the CT scan to discuss the results and treatment options.  For pain management, she will continue on a fentanyl patch and Percocet for breakthrough pain. For the cough, she will continue Tessalon as needed.  She was advised to call immediately if she has any concerning symptoms in the interval. The patient voices understanding of current disease status and treatment options and is in agreement with the current care plan. All questions were answered. The patient knows to call the clinic with any problems, questions or concerns. We can certainly see the patient much sooner if necessary.

## 2017-09-07 NOTE — Telephone Encounter (Signed)
Scheduled appt per 4/11 los - Reminder letter sent in the mail with appt date and time . [facility] radiology to contact patient with apt date and time.

## 2017-09-07 NOTE — Patient Instructions (Signed)
[facility] Discharge Instructions for Patients Receiving Chemotherapy  Today you received the following chemotherapy agents Avastin, alimta, and carboplatin  To help prevent nausea and vomiting after your treatment, we encourage you to take your nausea medication as directed   If you develop nausea and vomiting that is not controlled by your nausea medication, call the clinic.   BELOW ARE SYMPTOMS THAT SHOULD BE REPORTED IMMEDIATELY:  *FEVER GREATER THAN 100.5 F  *CHILLS WITH OR WITHOUT FEVER  NAUSEA AND VOMITING THAT IS NOT CONTROLLED WITH YOUR NAUSEA MEDICATION  *UNUSUAL SHORTNESS OF BREATH  *UNUSUAL BRUISING OR BLEEDING  TENDERNESS IN MOUTH AND THROAT WITH OR WITHOUT PRESENCE OF ULCERS  *URINARY PROBLEMS  *BOWEL PROBLEMS  UNUSUAL RASH Items with * indicate a potential emergency and should be followed up as soon as possible.  Feel free to call the clinic should you have any questions or concerns. The clinic phone number is [phone].  Please show the [facility] at check-in to the Emergency Department and triage nurse.

## 2017-09-12 ENCOUNTER — Telehealth: Payer: Self-pay

## 2017-09-12 DIAGNOSIS — C3492 Malignant neoplasm of unspecified part of left bronchus or lung: Secondary | ICD-10-CM

## 2017-09-12 DIAGNOSIS — R590 Localized enlarged lymph nodes: Secondary | ICD-10-CM

## 2017-09-12 MED ORDER — OXYCODONE-ACETAMINOPHEN 5-325 MG PO TABS: 1.0000 | ORAL_TABLET | ORAL | 0 refills | Status: DC | PRN

## 2017-09-12 MED ORDER — FENTANYL 25 MCG/HR TD PT72: 25.0000 ug | MEDICATED_PATCH | TRANSDERMAL | 0 refills | Status: DC

## 2017-09-12 NOTE — Telephone Encounter (Signed)
Requests refills to pick up thursday

## 2017-09-13 ENCOUNTER — Other Ambulatory Visit: Payer: Self-pay | Admitting: Radiation Therapy

## 2017-09-13 DIAGNOSIS — C7931 Secondary malignant neoplasm of brain: Secondary | ICD-10-CM

## 2017-09-13 DIAGNOSIS — C7949 Secondary malignant neoplasm of other parts of nervous system: Principal | ICD-10-CM

## 2017-09-14 ENCOUNTER — Inpatient Hospital Stay: Payer: BLUE CROSS/BLUE SHIELD

## 2017-09-14 DIAGNOSIS — C3412 Malignant neoplasm of upper lobe, left bronchus or lung: Secondary | ICD-10-CM | POA: Diagnosis not present

## 2017-09-14 DIAGNOSIS — C3492 Malignant neoplasm of unspecified part of left bronchus or lung: Secondary | ICD-10-CM

## 2017-09-14 LAB — CBC WITH DIFFERENTIAL/PLATELET
BASOS ABS: 0 10*3/uL (ref 0.0–0.1)
Basophils Relative: 0 %
EOS PCT: 2 %
Eosinophils Absolute: 0.1 10*3/uL (ref 0.0–0.5)
HEMATOCRIT: 39.3 % (ref 34.8–46.6)
Hemoglobin: 13.2 g/dL (ref 11.6–15.9)
LYMPHS ABS: 0.9 10*3/uL (ref 0.9–3.3)
LYMPHS PCT: 31 %
MCH: 33.9 pg (ref 26.0–34.0)
MCHC: 33.6 g/dL (ref 32.0–36.0)
MCV: 101 fL (ref 81.0–101.0)
MONOS PCT: 14 %
Monocytes Absolute: 0.4 10*3/uL (ref 0.1–0.9)
NEUTROS ABS: 1.6 10*3/uL (ref 1.5–6.5)
Neutrophils Relative %: 53 %
Platelets: 157 10*3/uL (ref 145–400)
RBC: 3.89 MIL/uL (ref 3.70–5.32)
RDW: 13.2 % (ref 11.1–15.7)
WBC: 3 10*3/uL — ABNORMAL LOW (ref 3.9–10.0)

## 2017-09-14 LAB — COMPREHENSIVE METABOLIC PANEL
ALT: 40 U/L (ref 0–55)
AST: 31 U/L (ref 5–34)
Albumin: 3.8 g/dL (ref 3.5–5.0)
Alkaline Phosphatase: 89 U/L (ref 40–150)
Anion gap: 8 (ref 3–11)
BUN: 12 mg/dL (ref 7–26)
CHLORIDE: 101 mmol/L (ref 98–109)
CO2: 29 mmol/L (ref 22–29)
Calcium: 10 mg/dL (ref 8.4–10.4)
Creatinine, Ser: 0.79 mg/dL (ref 0.60–1.10)
Glucose, Bld: 89 mg/dL (ref 70–140)
POTASSIUM: 4.4 mmol/L (ref 3.5–5.1)
Sodium: 138 mmol/L (ref 136–145)
Total Bilirubin: 0.4 mg/dL (ref 0.2–1.2)
Total Protein: 7.6 g/dL (ref 6.4–8.3)

## 2017-09-19 ENCOUNTER — Telehealth: Payer: Self-pay | Admitting: Medical Oncology

## 2017-09-19 NOTE — Telephone Encounter (Signed)
Sinus infection -PCP today prescribed antibiotic Fentanyl patch.Fentanyl patch fell off today -she noticed it at 2 pm when she got home. She had on short sleeves and can't figure out how it happened other than she noticed they don"t stick as well as previous box.She got last refill at [place] [facility]. Does she take the patch that is due Thursday . Per Julien Nordmann I told her to put on patch that is due Thursday .

## 2017-09-21 ENCOUNTER — Inpatient Hospital Stay: Payer: BLUE CROSS/BLUE SHIELD

## 2017-09-21 ENCOUNTER — Ambulatory Visit (HOSPITAL_BASED_OUTPATIENT_CLINIC_OR_DEPARTMENT_OTHER)
Admission: RE | Admit: 2017-09-21 | Discharge: 2017-09-21 | Disposition: A | Discharge status: Home / Self Care | Payer: BLUE CROSS/BLUE SHIELD | Source: Ambulatory Visit | Attending: Oncology | Admitting: Oncology

## 2017-09-21 VITALS — BP 120/84 | HR 88 | Temp 97.8°F | Resp 20

## 2017-09-21 DIAGNOSIS — C3412 Malignant neoplasm of upper lobe, left bronchus or lung: Secondary | ICD-10-CM | POA: Diagnosis not present

## 2017-09-21 DIAGNOSIS — C787 Secondary malignant neoplasm of liver and intrahepatic bile duct: Secondary | ICD-10-CM | POA: Insufficient documentation

## 2017-09-21 DIAGNOSIS — R590 Localized enlarged lymph nodes: Secondary | ICD-10-CM | POA: Diagnosis not present

## 2017-09-21 DIAGNOSIS — C3492 Malignant neoplasm of unspecified part of left bronchus or lung: Secondary | ICD-10-CM

## 2017-09-21 DIAGNOSIS — I700 Atherosclerosis of aorta: Secondary | ICD-10-CM | POA: Insufficient documentation

## 2017-09-21 DIAGNOSIS — Z95828 Presence of other vascular implants and grafts: Secondary | ICD-10-CM

## 2017-09-21 LAB — COMPREHENSIVE METABOLIC PANEL
ALBUMIN: 3.7 g/dL (ref 3.5–5.0)
ALT: 75 U/L — AB (ref 10–47)
AST: 69 U/L — AB (ref 11–38)
Alkaline Phosphatase: 94 U/L — ABNORMAL HIGH (ref 26–84)
Anion gap: 11 (ref 5–15)
BILIRUBIN TOTAL: 0.6 mg/dL (ref 0.2–1.6)
BUN: 8 mg/dL (ref 7–22)
CALCIUM: 9.6 mg/dL (ref 8.0–10.3)
CO2: 29 mmol/L (ref 18–33)
CREATININE: 0.7 mg/dL (ref 0.60–1.20)
Chloride: 103 mmol/L (ref 98–108)
Glucose, Bld: 85 mg/dL (ref 73–118)
Potassium: 3.7 mmol/L (ref 3.3–4.7)
SODIUM: 143 mmol/L (ref 128–145)
Total Protein: 7 g/dL (ref 6.4–8.1)

## 2017-09-21 LAB — CBC WITH DIFFERENTIAL/PLATELET
BASOS PCT: 0 %
Basophils Absolute: 0 10*3/uL (ref 0.0–0.1)
Eosinophils Absolute: 0 10*3/uL (ref 0.0–0.5)
Eosinophils Relative: 1 %
HEMATOCRIT: 37.6 % (ref 34.8–46.6)
HEMOGLOBIN: 12.6 g/dL (ref 11.6–15.9)
Lymphocytes Relative: 20 %
Lymphs Abs: 1.1 10*3/uL (ref 0.9–3.3)
MCH: 33.9 pg (ref 26.0–34.0)
MCHC: 33.5 g/dL (ref 32.0–36.0)
MCV: 101.1 fL — ABNORMAL HIGH (ref 81.0–101.0)
MONOS PCT: 14 %
Monocytes Absolute: 0.8 10*3/uL (ref 0.1–0.9)
NEUTROS ABS: 3.8 10*3/uL (ref 1.5–6.5)
NEUTROS PCT: 65 %
Platelets: 140 10*3/uL — ABNORMAL LOW (ref 145–400)
RBC: 3.72 MIL/uL (ref 3.70–5.32)
RDW: 13.8 % (ref 11.1–15.7)
WBC: 5.8 10*3/uL (ref 3.9–10.0)

## 2017-09-21 MED ORDER — HEPARIN SOD (PORK) LOCK FLUSH 100 UNIT/ML IV SOLN
500.0000 [IU] | Freq: Once | INTRAVENOUS | Status: AC
  Administered 2017-09-21: 500 [IU] via INTRAVENOUS
  Filled 2017-09-21: qty 5

## 2017-09-21 MED ORDER — SODIUM CHLORIDE 0.9% FLUSH
10.0000 mL | INTRAVENOUS | Status: AC | PRN
  Administered 2017-09-21: 10 mL via INTRAVENOUS
  Filled 2017-09-21: qty 10

## 2017-09-21 MED ORDER — IOPAMIDOL (ISOVUE-300) INJECTION 61%
100.0000 mL | Freq: Once | INTRAVENOUS | Status: AC | PRN
  Administered 2017-09-21: 100 mL via INTRAVENOUS

## 2017-09-21 NOTE — Addendum Note (Signed)
Addended by: San Morelle on: 09/21/2017 11:04 AM   Modules accepted: Orders, SmartSet

## 2017-09-22 ENCOUNTER — Telehealth: Payer: Self-pay | Admitting: *Deleted

## 2017-09-22 NOTE — Telephone Encounter (Signed)
To close encounter

## 2017-09-22 NOTE — Telephone Encounter (Signed)
Pt called states " My pain medicine is only lasting me 3 hours instead of 4 hours. I am twitching as I wait for the hour to pass until I can take another pain pill." Asked pt about Fentanyl patch.Pt states " it doesn't work on my pain, it helps some but not really."Informed pt MD out office and no changes will be made to pain meds without MD approval. Pt will discuss this concern on Thursday at next office visit.

## 2017-09-26 ENCOUNTER — Telehealth: Payer: Self-pay | Admitting: *Deleted

## 2017-09-26 NOTE — Telephone Encounter (Signed)
Clothing feels uncomfortable, " feels like my insides are eating my insides. Under my ribs like when you're hungry.  I feel like I did before I started treatment. I felt better when I was getting chemo."I will be due for my pain meds and I'd like to talk to him about " upping" one of them or both because the pain is really bad." Discussed with pt I will pass above concerns to MD for review and he will discuss at next office visit 5/2. No further concerns.

## 2017-09-26 NOTE — Telephone Encounter (Signed)
I am seeing her in 2 days. I will assess during that visit.

## 2017-09-28 ENCOUNTER — Encounter: Payer: Self-pay | Admitting: Internal Medicine

## 2017-09-28 ENCOUNTER — Inpatient Hospital Stay: Payer: BLUE CROSS/BLUE SHIELD

## 2017-09-28 ENCOUNTER — Inpatient Hospital Stay: Payer: BLUE CROSS/BLUE SHIELD | Attending: Internal Medicine | Admitting: Internal Medicine

## 2017-09-28 DIAGNOSIS — Z95828 Presence of other vascular implants and grafts: Secondary | ICD-10-CM

## 2017-09-28 DIAGNOSIS — E785 Hyperlipidemia, unspecified: Secondary | ICD-10-CM | POA: Insufficient documentation

## 2017-09-28 DIAGNOSIS — C3412 Malignant neoplasm of upper lobe, left bronchus or lung: Secondary | ICD-10-CM | POA: Diagnosis not present

## 2017-09-28 DIAGNOSIS — G8929 Other chronic pain: Secondary | ICD-10-CM | POA: Diagnosis not present

## 2017-09-28 DIAGNOSIS — R0600 Dyspnea, unspecified: Secondary | ICD-10-CM | POA: Diagnosis not present

## 2017-09-28 DIAGNOSIS — M545 Low back pain: Secondary | ICD-10-CM | POA: Diagnosis not present

## 2017-09-28 DIAGNOSIS — R590 Localized enlarged lymph nodes: Secondary | ICD-10-CM | POA: Diagnosis not present

## 2017-09-28 DIAGNOSIS — K219 Gastro-esophageal reflux disease without esophagitis: Secondary | ICD-10-CM | POA: Insufficient documentation

## 2017-09-28 DIAGNOSIS — Z9221 Personal history of antineoplastic chemotherapy: Secondary | ICD-10-CM | POA: Insufficient documentation

## 2017-09-28 DIAGNOSIS — Z923 Personal history of irradiation: Secondary | ICD-10-CM | POA: Insufficient documentation

## 2017-09-28 DIAGNOSIS — C7931 Secondary malignant neoplasm of brain: Secondary | ICD-10-CM | POA: Insufficient documentation

## 2017-09-28 DIAGNOSIS — C3492 Malignant neoplasm of unspecified part of left bronchus or lung: Secondary | ICD-10-CM

## 2017-09-28 DIAGNOSIS — Z79899 Other long term (current) drug therapy: Secondary | ICD-10-CM | POA: Insufficient documentation

## 2017-09-28 DIAGNOSIS — Z5111 Encounter for antineoplastic chemotherapy: Secondary | ICD-10-CM | POA: Diagnosis not present

## 2017-09-28 DIAGNOSIS — C787 Secondary malignant neoplasm of liver and intrahepatic bile duct: Secondary | ICD-10-CM | POA: Insufficient documentation

## 2017-09-28 LAB — COMPREHENSIVE METABOLIC PANEL
ALK PHOS: 96 U/L (ref 40–150)
ALT: 69 U/L — AB (ref 0–55)
AST: 63 U/L — AB (ref 5–34)
Albumin: 3.8 g/dL (ref 3.5–5.0)
Anion gap: 9 (ref 3–11)
BUN: 4 mg/dL — AB (ref 7–26)
CHLORIDE: 104 mmol/L (ref 98–109)
CO2: 26 mmol/L (ref 22–29)
CREATININE: 0.73 mg/dL (ref 0.60–1.10)
Calcium: 10.4 mg/dL (ref 8.4–10.4)
GFR calc Af Amer: >60 mL/min (ref >60)
GFR calc non Af Amer: >60 mL/min (ref >60)
GLUCOSE: 95 mg/dL (ref 70–140)
Potassium: 3.8 mmol/L (ref 3.5–5.1)
SODIUM: 139 mmol/L (ref 136–145)
Total Bilirubin: 0.4 mg/dL (ref 0.2–1.2)
Total Protein: 7.3 g/dL (ref 6.4–8.3)

## 2017-09-28 LAB — CBC WITH DIFFERENTIAL/PLATELET
BASOS ABS: 0 10*3/uL (ref 0.0–0.1)
Basophils Relative: 0 %
EOS ABS: 0.1 10*3/uL (ref 0.0–0.5)
Eosinophils Relative: 1 %
HCT: 38.5 % (ref 34.8–46.6)
HEMOGLOBIN: 12.9 g/dL (ref 11.6–15.9)
LYMPHS PCT: 20 %
Lymphs Abs: 0.8 10*3/uL — ABNORMAL LOW (ref 0.9–3.3)
MCH: 33.4 pg (ref 25.1–34.0)
MCHC: 33.5 g/dL (ref 31.5–36.0)
MCV: 99.6 fL (ref 79.5–101.0)
Monocytes Absolute: 0.8 10*3/uL (ref 0.1–0.9)
Monocytes Relative: 20 %
NEUTROS PCT: 59 %
Neutro Abs: 2.3 10*3/uL (ref 1.5–6.5)
Platelets: 318 10*3/uL (ref 145–400)
RBC: 3.87 MIL/uL (ref 3.70–5.45)
RDW: 15.2 % — ABNORMAL HIGH (ref 11.2–14.5)
WBC: 3.9 10*3/uL (ref 3.9–10.3)

## 2017-09-28 LAB — TOTAL PROTEIN, URINE DIPSTICK: Protein, ur: NEGATIVE mg/dL

## 2017-09-28 MED ORDER — OXYCODONE-ACETAMINOPHEN 5-325 MG PO TABS: 1.0000 | ORAL_TABLET | ORAL | 0 refills | Status: DC | PRN

## 2017-09-28 MED ORDER — FENTANYL 25 MCG/HR TD PT72: 25.0000 ug | MEDICATED_PATCH | TRANSDERMAL | 0 refills | Status: DC

## 2017-09-28 MED ORDER — SODIUM CHLORIDE 0.9% FLUSH
10.0000 mL | INTRAVENOUS | Status: DC | PRN
  Administered 2017-09-28: 10 mL
  Filled 2017-09-28: qty 10

## 2017-09-28 NOTE — Progress Notes (Signed)
[facility] [phone]   Fax:([phone]  OFFICE PROGRESS NOTE  Hulan Fess, MD [place] [address]  DIAGNOSIS: Stage IIIA (T2a, N2, M0) non-small cell lung cancer, adenocarcinoma presented with left suprahilar mass, mediastinal lymphadenopathy and suspicious pulmonary nodule in the left upper lobe diagnosed in November 2017. No actionable mutations on [employer] testing.  Biomarker Findings Microsatellite Status - MS-Stable Tumor Mutational Burden - TMB-Low (3 Muts/Mb) Genomic Findings For a complete list of the genes assayed, please refer to the Appendix. STK11 loss exons 1 AXL amplification - equivocal? HGF amplification - equivocal? KRAS L19F RICTOR amplification CHEK2 T374f*15 FGF10 amplification TP53 Q136P 7 Disease relevant genes with no reportable alterations: RET, ROS1, ALK, BRAF, ERBB2, MET, EGFR  PDL1 Expression 0%.  PRIOR THERAPY:  1) Status post concurrent chemoradiation with weekly carboplatin for AUC of 2 and paclitaxel 45 MG/M2 for 7 cycles. Last dose was given 06/13/2016. 2) Immunotherapy with Imfinzi (Durvalumab) 10 MG/KG every 2 weeks. First dose 08/11/2016. Status post 18 cycles.  Discontinued secondary to disease progression. 3) Systemic chemotherapy with carboplatin for AUC of 5, Alimta 500 mg/M2 and Avastin 15 mg/KG every 3 weeks status post 6 cycles.  Last dose was giving September 07, 2017 with a stable disease.  CURRENT THERAPY: Maintenance treatment with Alimta 500 mg/M2 and Avastin 15 mg/KG every 3 weeks.  First dose Oct 05, 2017.  INTERVAL HISTORY: Debbie SPERANZA52y.o. female returns to the clinic today for follow-up visit accompanied by her husband.  The patient is feeling fine today with no specific complaints except for the persistent low back pain as well as pain at the lower rib cage bilaterally.  She is currently on fentanyl patch as well as Percocet.  She takes her Percocet every 3 hours.   She she also has mild cough productive of yellowish sputum and she was treated recently with the back.  She denied having any hemoptysis.  She has no recent weight loss or night sweats.  She has no fever or chills.  She tolerated her treatment with carboplatin, Alimta and Avastin fairly well.  The patient had a repeat CT scan of the chest, abdomen and pelvis performed recently and she is here for evaluation and discussion of her scan results and treatment options.  MEDICAL HISTORY: Past Medical History:  Diagnosis Date  . Allergic rhinitis   . Deaf, left   . Diverticulosis    sigmoid colon  . Dyspnea   . Encounter for antineoplastic chemotherapy 04/18/2016  . Encounter for antineoplastic immunotherapy 07/21/2016  . GERD (gastroesophageal reflux disease)   . Hyperlipidemia   . Lung cancer ([facility] 03/2016   non small cell lung cancer favoring adenocarcinoma   . Lung nodules   . Migraine   . Odynophagia 05/24/2016  . Pneumonia   . PONV (postoperative nausea and vomiting)   . Tachycardia, paroxysmal ([facility] 09/08/2016  . Wears glasses     ALLERGIES:  is allergic to penicillins and chantix [varenicline].  MEDICATIONS:  Current Outpatient Medications  Medication Sig Dispense Refill  . albuterol (PROVENTIL HFA;VENTOLIN HFA) 108 (90 Base) MCG/ACT inhaler Inhale 2 puffs into the lungs every 6 (six) hours as needed for wheezing or shortness of breath. 1 Inhaler 0  . benzonatate (TESSALON) 100 MG capsule Take 1 capsule (100 mg total) by mouth every 4 (four) hours as needed for cough. 60 capsule 1  . Calcium-Magnesium-Vitamin D 185-50-100 MG-MG-UNIT CAPS Take 1 tablet by  mouth every evening.    . cetirizine (ZYRTEC) 10 MG tablet Take 10 mg by mouth daily.    Marland Kitchen dexamethasone (DECADRON) 4 MG tablet Take 1 tablet twice a day the day before, day of, and day after each cycle of chemotherapy. 30 tablet 1  . diphenhydrAMINE (BENADRYL) 25 mg capsule Take 25 mg by mouth at bedtime as needed.    Marland Kitchen  ELDERBERRY PO Take by mouth every morning.    . fentaNYL (DURAGESIC - DOSED MCG/HR) 25 MCG/HR patch Place 1 patch (25 mcg total) onto the skin every 3 (three) days. 5 patch 0  . folic acid (FOLVITE) 1 MG tablet Take 1 tablet (1 mg total) by mouth daily. 30 tablet 4  . guaiFENesin (MUCINEX) 600 MG 12 hr tablet Take 600 mg by mouth at bedtime as needed.    . Lactobacillus (PROBIOTIC ACIDOPHILUS) CAPS Take by mouth.    . lidocaine-prilocaine (EMLA) cream Apply 1 application topically as needed. 30 g 2  . LORazepam (ATIVAN) 0.5 MG tablet Take 1 tablet (0.5 mg total) by mouth as needed for anxiety (take 30 min prior to MRI scans and treatments). 30 tablet 0  . Milk Thistle 1000 MG CAPS Take 1 capsule by mouth daily.    . Misc Natural Products (TURMERIC CURCUMIN) CAPS Take 1 capsule by mouth every evening. 1500 mg capsule    . Multiple Vitamins-Minerals (WOMENS 50+ MULTI VITAMIN/MIN) TABS Take 1 tablet by mouth daily.    Marland Kitchen OVER THE COUNTER MEDICATION Take 250 mg by mouth daily. Jiaogulan Supplement    . oxyCODONE-acetaminophen (PERCOCET/ROXICET) 5-325 MG tablet Take 1 tablet by mouth every 4 (four) hours as needed (Chronic pain management). 60 tablet 0  . pantoprazole (PROTONIX) 40 MG tablet Take 1 tablet (40 mg total) 2 (two) times daily by mouth. 180 tablet 3  . pravastatin (PRAVACHOL) 20 MG tablet Take 20 mg by mouth every evening.  3  . PSYLLIUM HUSK PO Take 1 capsule by mouth 2 (two) times daily.     . prochlorperazine (COMPAZINE) 10 MG tablet Take 1 tablet (10 mg total) by mouth every 6 (six) hours as needed for nausea or vomiting. (Patient not taking: Reported on 08/17/2017) 30 tablet 0   No current facility-administered medications for this visit.    Facility-Administered Medications Ordered in Other Visits  Medication Dose Route Frequency Provider Last Rate Last Dose  . sodium chloride flush (NS) 0.9 % injection 10 mL  10 mL Intracatheter PRN Curt Bears, MD      . sodium chloride flush  (NS) 0.9 % injection 10 mL  10 mL Intravenous PRN Curt Bears, MD   10 mL at 09/21/17 1103    SURGICAL HISTORY:  Past Surgical History:  Procedure Laterality Date  . ABDOMINAL HYSTERECTOMY    . IR FLUORO GUIDE PORT INSERTION RIGHT  06/26/2017  . IR US GUIDE VASC ACCESS RIGHT  06/26/2017  . VIDEO BRONCHOSCOPY Bilateral 07/03/2015   Procedure: VIDEO BRONCHOSCOPY WITH FLUORO;  Surgeon: Marshell Garfinkel, MD;  Location: [facility];  Service: Cardiopulmonary;  Laterality: Bilateral;  . VIDEO BRONCHOSCOPY WITH ENDOBRONCHIAL ULTRASOUND N/A 04/06/2016   Procedure: VIDEO BRONCHOSCOPY WITH ENDOBRONCHIAL ULTRASOUND;  Surgeon: Collene Gobble, MD;  Location: [facility];  Service: Thoracic;  Laterality: N/A;  . WRIST FRACTURE SURGERY      REVIEW OF SYSTEMS:  Constitutional: positive for fatigue Eyes: negative Ears, nose, mouth, throat, and face: negative Respiratory: positive for cough, dyspnea on exertion, pleurisy/chest pain and sputum Cardiovascular: negative Gastrointestinal:  negative Genitourinary:negative Integument/breast: negative Hematologic/lymphatic: negative Musculoskeletal:positive for back pain Neurological: negative Behavioral/Psych: negative Endocrine: negative Allergic/Immunologic: negative   PHYSICAL EXAMINATION: General appearance: alert, cooperative, fatigued and no distress Head: Normocephalic, without obvious abnormality, atraumatic Neck: no adenopathy, no JVD, supple, symmetrical, trachea midline and thyroid not enlarged, symmetric, no tenderness/mass/nodules Lymph nodes: Cervical, supraclavicular, and axillary nodes normal. Resp: wheezes bilaterally Back: symmetric, no curvature. ROM normal. No CVA tenderness. Cardio: regular rate and rhythm, S1, S2 normal, no murmur, click, rub or gallop GI: soft, non-tender; bowel sounds normal; no masses,  no organomegaly Extremities: extremities normal, atraumatic, no cyanosis or edema Neurologic: Alert and oriented X 3, normal  strength and tone. Normal symmetric reflexes. Normal coordination and gait  ECOG PERFORMANCE STATUS: 1 - Symptomatic but completely ambulatory  Blood pressure 116/83, pulse (!) 106, temperature 98.4 F (36.9 C), temperature source Oral, resp. rate 17, height 5' 8" (1.727 m), weight 191 lb 6.4 oz (86.8 kg), SpO2 97 %.  LABORATORY DATA: Lab Results  Component Value Date   WBC 3.9 09/28/2017   HGB 12.9 09/28/2017   HCT 38.5 09/28/2017   MCV 99.6 09/28/2017   PLT 318 09/28/2017      Chemistry      Component Value Date/Time   NA 143 09/21/2017 1044   NA 140 05/22/2017 1212   K 3.7 09/21/2017 1044   K 4.2 05/22/2017 1212   CL 103 09/21/2017 1044   CO2 29 09/21/2017 1044   CO2 21 (L) 05/22/2017 1212   BUN 8 09/21/2017 1044   BUN 10.3 05/22/2017 1212   CREATININE 0.70 09/21/2017 1044   CREATININE 0.8 05/22/2017 1212      Component Value Date/Time   CALCIUM 9.6 09/21/2017 1044   CALCIUM 9.6 05/22/2017 1212   ALKPHOS 94 (H) 09/21/2017 1044   ALKPHOS 113 05/22/2017 1212   AST 69 (H) 09/21/2017 1044   AST 17 05/22/2017 1212   ALT 75 (H) 09/21/2017 1044   ALT 27 05/22/2017 1212   BILITOT 0.6 09/21/2017 1044   BILITOT 0.31 05/22/2017 1212       RADIOGRAPHIC STUDIES: Ct Chest W Contrast  Result Date: 09/21/2017 CLINICAL DATA:  Lung cancer with brain metastases. EXAM: CT CHEST, ABDOMEN, AND PELVIS WITH CONTRAST TECHNIQUE: Multidetector CT imaging of the chest, abdomen and pelvis was performed following the standard protocol during bolus administration of intravenous contrast. CONTRAST:  134m ISOVUE-300 IOPAMIDOL (ISOVUE-300) INJECTION 61% COMPARISON:  07/26/2017 FINDINGS: CT CHEST FINDINGS Cardiovascular: The heart size is normal. No pericardial effusion. No thoracic aortic aneurysm. Right Port-A-Cath tip is positioned in the distal SVC. Right pericardial nodule slightly more prominent than previously. Mediastinum/Nodes: 13 mm high right paratracheal lymph node is stable. Stable  previously described low left paratracheal lymph node at 10 mm. Abnormal soft tissue attenuation in the left hilum is stable and compatible with treatment related change. No right hilar lymphadenopathy. The esophagus has normal imaging features. There is no axillary lymphadenopathy. Lungs/Pleura: Lingular nodule has progressed in the interval measuring 15 x 20 mm today compared to 12 x 18 mm when I remeasure it in a similar fashion on the prior study. As before, scattered additional small bilateral pulmonary nodules are evident. 6 mm posterior right upper lobe nodule (70/4) is increased from 5 mm previously. 6 x 12 mm right lower lobe subpleural nodule measured on the prior study is now 7 x 13 mm. 5 mm left apical nodule (34/4) has increased from 3 mm previously. Musculoskeletal: Bone windows reveal no worrisome lytic or  sclerotic osseous lesions. CT ABDOMEN PELVIS FINDINGS Hepatobiliary: 19 mm hypoattenuating lesion in the central right liver is similar to prior, measuring 20 mm previously. Anterior left liver lesion is 13 mm today compared to 14 mm previously. No new focal liver abnormality evident. There is no evidence for gallstones, gallbladder wall thickening, or pericholecystic fluid. No intrahepatic or extrahepatic biliary dilation. Pancreas: No focal mass lesion. No dilatation of the main duct. No intraparenchymal cyst. No peripancreatic edema. Spleen: No splenomegaly. No focal mass lesion. Adrenals/Urinary Tract: No adrenal nodule or mass. Small bilateral cortical lesions in each kidney cannot be definitively characterize but are stable in the interval. No evidence for hydroureter. The urinary bladder appears normal for the degree of distention. Stomach/Bowel: Stomach is nondistended. No gastric wall thickening. No evidence of outlet obstruction. Duodenum is normally positioned as is the ligament of Treitz. No small bowel wall thickening. No small bowel dilatation. The terminal ileum is normal.  Diverticular changes are noted in the left colon without evidence of diverticulitis. Vascular/Lymphatic: There is abdominal aortic atherosclerosis without aneurysm. Amorphous soft tissue just cranial to the celiac axis is similar to prior. There is no gastrohepatic or hepatoduodenal ligament lymphadenopathy. No intraperitoneal or retroperitoneal lymphadenopathy. No pelvic sidewall lymphadenopathy. Reproductive: Uterus surgically absent. Small cystic foci in the left ovary stable. Right ovary unremarkable. Other: No intraperitoneal free fluid. Musculoskeletal: Bone windows reveal no worrisome lytic or sclerotic osseous lesions. IMPRESSION: 1. No substantial interval change in exam. Overall, the pulmonary nodules measure minimally larger on today's study in the hepatic lesions measure minimally smaller. No clear trend towards worsening or improving disease. 2. Similar appearance of mediastinal lymphadenopathy. 3.  Aortic Atherosclerois (ICD10-170.0) Electronically Signed   By: Misty Stanley M.D.   On: 09/21/2017 17:01   Ct Abdomen Pelvis W Contrast  Result Date: 09/21/2017 CLINICAL DATA:  Lung cancer with brain metastases. EXAM: CT CHEST, ABDOMEN, AND PELVIS WITH CONTRAST TECHNIQUE: Multidetector CT imaging of the chest, abdomen and pelvis was performed following the standard protocol during bolus administration of intravenous contrast. CONTRAST:  113m ISOVUE-300 IOPAMIDOL (ISOVUE-300) INJECTION 61% COMPARISON:  07/26/2017 FINDINGS: CT CHEST FINDINGS Cardiovascular: The heart size is normal. No pericardial effusion. No thoracic aortic aneurysm. Right Port-A-Cath tip is positioned in the distal SVC. Right pericardial nodule slightly more prominent than previously. Mediastinum/Nodes: 13 mm high right paratracheal lymph node is stable. Stable previously described low left paratracheal lymph node at 10 mm. Abnormal soft tissue attenuation in the left hilum is stable and compatible with treatment related change. No  right hilar lymphadenopathy. The esophagus has normal imaging features. There is no axillary lymphadenopathy. Lungs/Pleura: Lingular nodule has progressed in the interval measuring 15 x 20 mm today compared to 12 x 18 mm when I remeasure it in a similar fashion on the prior study. As before, scattered additional small bilateral pulmonary nodules are evident. 6 mm posterior right upper lobe nodule (70/4) is increased from 5 mm previously. 6 x 12 mm right lower lobe subpleural nodule measured on the prior study is now 7 x 13 mm. 5 mm left apical nodule (34/4) has increased from 3 mm previously. Musculoskeletal: Bone windows reveal no worrisome lytic or sclerotic osseous lesions. CT ABDOMEN PELVIS FINDINGS Hepatobiliary: 19 mm hypoattenuating lesion in the central right liver is similar to prior, measuring 20 mm previously. Anterior left liver lesion is 13 mm today compared to 14 mm previously. No new focal liver abnormality evident. There is no evidence for gallstones, gallbladder wall thickening, or pericholecystic fluid.  No intrahepatic or extrahepatic biliary dilation. Pancreas: No focal mass lesion. No dilatation of the main duct. No intraparenchymal cyst. No peripancreatic edema. Spleen: No splenomegaly. No focal mass lesion. Adrenals/Urinary Tract: No adrenal nodule or mass. Small bilateral cortical lesions in each kidney cannot be definitively characterize but are stable in the interval. No evidence for hydroureter. The urinary bladder appears normal for the degree of distention. Stomach/Bowel: Stomach is nondistended. No gastric wall thickening. No evidence of outlet obstruction. Duodenum is normally positioned as is the ligament of Treitz. No small bowel wall thickening. No small bowel dilatation. The terminal ileum is normal. Diverticular changes are noted in the left colon without evidence of diverticulitis. Vascular/Lymphatic: There is abdominal aortic atherosclerosis without aneurysm. Amorphous soft  tissue just cranial to the celiac axis is similar to prior. There is no gastrohepatic or hepatoduodenal ligament lymphadenopathy. No intraperitoneal or retroperitoneal lymphadenopathy. No pelvic sidewall lymphadenopathy. Reproductive: Uterus surgically absent. Small cystic foci in the left ovary stable. Right ovary unremarkable. Other: No intraperitoneal free fluid. Musculoskeletal: Bone windows reveal no worrisome lytic or sclerotic osseous lesions. IMPRESSION: 1. No substantial interval change in exam. Overall, the pulmonary nodules measure minimally larger on today's study in the hepatic lesions measure minimally smaller. No clear trend towards worsening or improving disease. 2. Similar appearance of mediastinal lymphadenopathy. 3.  Aortic Atherosclerois (ICD10-170.0) Electronically Signed   By: Misty Stanley M.D.   On: 09/21/2017 17:01    ASSESSMENT AND PLAN:  This is a very pleasant 52 years old white female with unresectable a stage IIIA non-small cell lung cancer, adenocarcinoma status post a course of concurrent chemoradiation with partial response. The patient is currently undergoing consolidation immunotherapy with Imfinzi (Durvalumab) status post 18 cycles. This treatment was discontinued secondary to disease progression and new liver metastasis. Her molecular studies showed no evidence for actionable mutations. The patient was started on systemic chemotherapy with carboplatin, Alimta and Avastin status post 6 cycles.   The patient tolerated the induction course of chemotherapy fairly well.  She had a repeat CT scan of the chest, abdomen and pelvis performed recently.  I personally and independently reviewed the scan images and discussed the results and showed the images to the patient today.  Her scan showed stable disease in general with mild increase in some of the pulmonary nodules but also mild decrease in some of the hepatic lesions. I recommended for the patient to consider treatment with  maintenance chemotherapy with Alimta 500 mg/M2 and Avastin 15 mg/KG every 3 weeks.  The patient is very familiar with the adverse effect of this treatment. She would like to proceed with the treatment as planned.  She is expected to start the first cycle of this treatment on 10/05/2017. For pain management, I gave her a refill of fentanyl patch as well as Percocet.  I also referred the patient to a pain clinic for evaluation and management of her condition.  There was no clear findings on the CT scan of the chest, abdomen and pelvis to explain her current pain status.  The musculoskeletal portion of the scan did not show any concerning findings. The patient will come back for follow-up visit in 4 weeks for evaluation with the next cycle of her treatment. She was advised to call immediately if she has any concerning symptoms in the interval. The patient voices understanding of current disease status and treatment options and is in agreement with the current care plan. All questions were answered. The patient knows to call the  clinic with any problems, questions or concerns. We can certainly see the patient much sooner if necessary.  Disclaimer: This note was dictated with voice recognition software. Similar sounding words can inadvertently be transcribed and may not be corrected upon review.

## 2017-09-28 NOTE — Progress Notes (Signed)
DISCONTINUE ON PATHWAY REGIMEN - Non-Small Cell Lung     A cycle is every 21 days:     Carboplatin      Pemetrexed      Bevacizumab   **Always confirm dose/schedule in your pharmacy ordering system**    REASON: Other Reason PRIOR TREATMENT: [id number]: Carboplatin AUC=5 + Pemetrexed 500 mg/m2 + Bevacizumab 15 mg/kg q21 Days x 4 Cycles TREATMENT RESPONSE: Stable Disease (SD)  START ON PATHWAY REGIMEN - Non-Small Cell Lung     A cycle is every 21 days:     Pemetrexed   **Always confirm dose/schedule in your pharmacy ordering system**    Patient Characteristics: Stage IV Metastatic, Nonsquamous, Maintenance - Chemotherapy/Immunotherapy, PS = 0, 1, Initial Paclitaxel + Carboplatin or Initial Pemetrexed + Platinum Agent AJCC T Category: T2b Current Disease Status: Distant Metastases AJCC N Category: N2 AJCC M Category: M1c AJCC 8 Stage Grouping: IVB Histology: Nonsquamous Cell ROS1 Rearrangement Status: Negative T790M Mutation Status: Not Applicable - EGFR Mutation Negative/Unknown Other Mutations/Biomarkers: No Other Actionable Mutations NTRK Gene Fusion Status: Negative PD-L1 Expression Status: Quantity Not Sufficient Chemotherapy/Immunotherapy LOT: Maintenance Chemotherapy/Immunotherapy Molecular Targeted Therapy: Not Appropriate ALK Translocation Status: Negative EGFR Mutation Status: Negative/Wild Type BRAF V600E Mutation Status: Negative Performance Status: PS = 0, 1 Intent of Therapy: Non-Curative / Palliative Intent, Discussed with Patient

## 2017-10-03 ENCOUNTER — Encounter: Payer: Self-pay | Admitting: Pharmacist

## 2017-10-04 ENCOUNTER — Telehealth: Payer: Self-pay | Admitting: Medical Oncology

## 2017-10-04 ENCOUNTER — Other Ambulatory Visit: Payer: Self-pay | Admitting: Medical Oncology

## 2017-10-04 NOTE — Telephone Encounter (Signed)
Asking for scan results . " I want to get to the bottom of my pain.". Also requests refill for pain med. She will not take to pharmacy until 5/12.

## 2017-10-05 ENCOUNTER — Encounter: Payer: Self-pay | Admitting: *Deleted

## 2017-10-05 ENCOUNTER — Inpatient Hospital Stay: Payer: BLUE CROSS/BLUE SHIELD

## 2017-10-05 ENCOUNTER — Telehealth: Payer: Self-pay

## 2017-10-05 ENCOUNTER — Other Ambulatory Visit: Payer: Self-pay | Admitting: Medical Oncology

## 2017-10-05 VITALS — BP 142/96 | HR 98 | Temp 98.9°F | Resp 16 | Wt 186.0 lb

## 2017-10-05 DIAGNOSIS — C3412 Malignant neoplasm of upper lobe, left bronchus or lung: Secondary | ICD-10-CM | POA: Diagnosis not present

## 2017-10-05 DIAGNOSIS — C3492 Malignant neoplasm of unspecified part of left bronchus or lung: Secondary | ICD-10-CM

## 2017-10-05 DIAGNOSIS — R590 Localized enlarged lymph nodes: Secondary | ICD-10-CM

## 2017-10-05 LAB — COMPREHENSIVE METABOLIC PANEL
ALT: 65 U/L — AB (ref 0–55)
AST: 56 U/L — ABNORMAL HIGH (ref 5–34)
Albumin: 4.2 g/dL (ref 3.5–5.0)
Alkaline Phosphatase: 103 U/L (ref 40–150)
Anion gap: 9 (ref 3–11)
BUN: 9 mg/dL (ref 7–26)
CO2: 26 mmol/L (ref 22–29)
Calcium: 10.1 mg/dL (ref 8.4–10.4)
Chloride: 103 mmol/L (ref 98–109)
Creatinine, Ser: 0.75 mg/dL (ref 0.60–1.10)
Glucose, Bld: 106 mg/dL (ref 70–140)
Potassium: 4.2 mmol/L (ref 3.5–5.1)
Sodium: 138 mmol/L (ref 136–145)
TOTAL PROTEIN: 7.8 g/dL (ref 6.4–8.3)
Total Bilirubin: 0.5 mg/dL (ref 0.2–1.2)

## 2017-10-05 LAB — CBC WITH DIFFERENTIAL/PLATELET
BASOS ABS: 0 10*3/uL (ref 0.0–0.1)
BASOS PCT: 0 %
EOS ABS: 0 10*3/uL (ref 0.0–0.5)
Eosinophils Relative: 0 %
HCT: 40.3 % (ref 34.8–46.6)
HEMOGLOBIN: 13.4 g/dL (ref 11.6–15.9)
Lymphocytes Relative: 11 %
Lymphs Abs: 1.1 10*3/uL (ref 0.9–3.3)
MCH: 33.3 pg (ref 25.1–34.0)
MCHC: 33.3 g/dL (ref 31.5–36.0)
MCV: 100.2 fL (ref 79.5–101.0)
Monocytes Absolute: 1.2 10*3/uL — ABNORMAL HIGH (ref 0.1–0.9)
Monocytes Relative: 12 %
NEUTROS PCT: 77 %
Neutro Abs: 7.4 10*3/uL — ABNORMAL HIGH (ref 1.5–6.5)
Platelets: 262 10*3/uL (ref 145–400)
RBC: 4.02 MIL/uL (ref 3.70–5.45)
RDW: 13.4 % (ref 11.2–14.5)
WBC: 9.7 10*3/uL (ref 3.9–10.3)

## 2017-10-05 MED ORDER — OXYCODONE-ACETAMINOPHEN 5-325 MG PO TABS: 1.0000 | ORAL_TABLET | ORAL | 0 refills | Status: DC | PRN

## 2017-10-05 MED ORDER — SODIUM CHLORIDE 0.9 % IV SOLN
Freq: Once | INTRAVENOUS | Status: AC
  Administered 2017-10-05: 10:00:00 via INTRAVENOUS

## 2017-10-05 MED ORDER — SODIUM CHLORIDE 0.9% FLUSH
10.0000 mL | INTRAVENOUS | Status: DC | PRN
  Administered 2017-10-05: 10 mL
  Filled 2017-10-05: qty 10

## 2017-10-05 MED ORDER — PROCHLORPERAZINE MALEATE 10 MG PO TABS
10.0000 mg | ORAL_TABLET | Freq: Once | ORAL | Status: AC
  Administered 2017-10-05: 10 mg via ORAL

## 2017-10-05 MED ORDER — PEMETREXED DISODIUM CHEMO INJECTION 500 MG
490.0000 mg/m2 | Freq: Once | INTRAVENOUS | Status: AC
  Administered 2017-10-05: 1000 mg via INTRAVENOUS
  Filled 2017-10-05: qty 40

## 2017-10-05 MED ORDER — PROCHLORPERAZINE MALEATE 10 MG PO TABS
ORAL_TABLET | ORAL | Status: AC
  Filled 2017-10-05: qty 1

## 2017-10-05 MED ORDER — HEPARIN SOD (PORK) LOCK FLUSH 100 UNIT/ML IV SOLN
500.0000 [IU] | Freq: Once | INTRAVENOUS | Status: AC | PRN
  Administered 2017-10-05: 500 [IU]
  Filled 2017-10-05: qty 5

## 2017-10-05 MED ORDER — SODIUM CHLORIDE 0.9 % IV SOLN
15.0000 mg/kg | Freq: Once | INTRAVENOUS | Status: AC
  Administered 2017-10-05: 1300 mg via INTRAVENOUS
  Filled 2017-10-05: qty 48

## 2017-10-05 MED ORDER — CYANOCOBALAMIN 1000 MCG/ML IJ SOLN
1000.0000 ug | Freq: Once | INTRAMUSCULAR | Status: AC
  Administered 2017-10-05: 1000 ug via INTRAMUSCULAR

## 2017-10-05 MED ORDER — CYANOCOBALAMIN 1000 MCG/ML IJ SOLN
INTRAMUSCULAR | Status: AC
  Filled 2017-10-05: qty 1

## 2017-10-05 NOTE — Telephone Encounter (Signed)
Patient requested for a port to be added to her current appointment. Per 5/9 los

## 2017-10-05 NOTE — Patient Instructions (Signed)
[facility] Discharge Instructions for Patients Receiving Chemotherapy  Today you received the following chemotherapy agents:  Avastin and Alimta.  To help prevent nausea and vomiting after your treatment, we encourage you to take your nausea medication as directed.   If you develop nausea and vomiting that is not controlled by your nausea medication, call the clinic.   BELOW ARE SYMPTOMS THAT SHOULD BE REPORTED IMMEDIATELY:  *FEVER GREATER THAN 100.5 F  *CHILLS WITH OR WITHOUT FEVER  NAUSEA AND VOMITING THAT IS NOT CONTROLLED WITH YOUR NAUSEA MEDICATION  *UNUSUAL SHORTNESS OF BREATH  *UNUSUAL BRUISING OR BLEEDING  TENDERNESS IN MOUTH AND THROAT WITH OR WITHOUT PRESENCE OF ULCERS  *URINARY PROBLEMS  *BOWEL PROBLEMS  UNUSUAL RASH Items with * indicate a potential emergency and should be followed up as soon as possible.  Feel free to call the clinic should you have any questions or concerns. The clinic phone number is [phone].  Please show the [facility] at check-in to the Emergency Department and triage nurse.

## 2017-10-05 NOTE — Progress Notes (Signed)
Oncology Nurse Navigator Documentation  Oncology Nurse Navigator Flowsheets 10/05/2017  Navigator Location [facility]  Navigator Encounter Type Treatment/I received a call from chemo RN.  He states patient needs assistance with pain management.  I looked at treatment plan and referrals.  She has been referred to pain clinic.  I spoke with Dr. Julien Nordmann to see if she could get more pain medication. He is concerned that he does not see anything on the scans that would indicate her continued pain, this is why she has been referred to pain clinic.  Dr. Julien Nordmann would also like her to be presented at the palliative TB.  I called Dr. Maryjean Ka office regarding referral but I was unable to reach them. I did leave them a vm message to be called with an update on referral.  I will update TB registry on referral for discussion.    Abnormal Finding Date 03/21/2016  Confirmed Diagnosis Date 04/06/2016  Treatment Initiated Date 04/25/2016  Patient Visit Type MedOnc  Treatment Phase Treatment  Barriers/Navigation Needs Education;Coordination of Care  Education Other  Interventions Coordination of Care;Education  Coordination of Care Other  Education Method Verbal  Acuity Level 3  Time Spent with Patient 60

## 2017-10-10 ENCOUNTER — Telehealth: Payer: Self-pay | Admitting: Medical Oncology

## 2017-10-10 ENCOUNTER — Inpatient Hospital Stay: Payer: BLUE CROSS/BLUE SHIELD

## 2017-10-10 ENCOUNTER — Other Ambulatory Visit: Payer: Self-pay | Admitting: Medical Oncology

## 2017-10-10 DIAGNOSIS — C3492 Malignant neoplasm of unspecified part of left bronchus or lung: Secondary | ICD-10-CM

## 2017-10-10 MED ORDER — FENTANYL 25 MCG/HR TD PT72: 25.0000 ug | MEDICATED_PATCH | TRANSDERMAL | 0 refills | Status: DC

## 2017-10-10 NOTE — Telephone Encounter (Signed)
Debbie Baker called and said pt on list today for conference. What does Debbie Baker need from Palliative care.

## 2017-10-10 NOTE — Telephone Encounter (Signed)
Level of pain unexplained by findings on CT scan. Discussion of pain management or referral to pain clinic.

## 2017-10-10 NOTE — Progress Notes (Signed)
Pt called for fentanyl refill. Done and ready for pick up.

## 2017-10-12 ENCOUNTER — Encounter: Payer: Self-pay | Admitting: Internal Medicine

## 2017-10-12 NOTE — Progress Notes (Signed)
  This  is a patient of Dr. 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  She was reviewed at the Palliative Tumor board on Monday May 13,2019 for chronic pain issues.    I spoke to [facility] yesterday and she is receptive to a referral to the [facility] in [place] with Dr Johnny Bridge for consultation,  recommendations and to assume responsibility for pain management.  A referral was faxed and patient was notified.  She awaits call back from [facility] and she will call me to set up an appointment at her convenience in the [place] clinic for a [place] meeting.  Wadie Lessen NP    Palliative Medicine Team Team Phone # [phone]  No charge

## 2017-10-18 ENCOUNTER — Telehealth: Payer: Self-pay | Admitting: *Deleted

## 2017-10-18 DIAGNOSIS — R590 Localized enlarged lymph nodes: Secondary | ICD-10-CM

## 2017-10-18 MED ORDER — OXYCODONE-ACETAMINOPHEN 5-325 MG PO TABS: 1.0000 | ORAL_TABLET | ORAL | 0 refills | Status: DC | PRN

## 2017-10-18 NOTE — Telephone Encounter (Signed)
VM from pt requesting pain medication. Called [facility], they do not have any information regarding pt or a referral, office is not connected with Epic. They are part of [facility] system.  Refferral made 5/2 to Pain Management clinic.no additional information regarding any appts with a pain management clinic  Call to [facility] [phone]. New referral placed for pt to be established at this clinic for pain management. Referral and office notes faxed to [phone] Refill on pain medication today by Dr. Julien Nordmann,-  Pt's significant to there here to pick up pt's pain medication.

## 2017-10-24 ENCOUNTER — Other Ambulatory Visit: Payer: Self-pay | Admitting: Medical Oncology

## 2017-10-24 DIAGNOSIS — C3492 Malignant neoplasm of unspecified part of left bronchus or lung: Secondary | ICD-10-CM

## 2017-10-24 MED ORDER — FENTANYL 25 MCG/HR TD PT72: 25.0000 ug | MEDICATED_PATCH | TRANSDERMAL | 0 refills | Status: DC

## 2017-10-24 NOTE — Progress Notes (Signed)
Fentanyl ready for pick up and pt notified.

## 2017-10-25 ENCOUNTER — Inpatient Hospital Stay: Payer: BLUE CROSS/BLUE SHIELD

## 2017-10-25 ENCOUNTER — Other Ambulatory Visit: Payer: Self-pay | Admitting: Medical Oncology

## 2017-10-25 ENCOUNTER — Inpatient Hospital Stay (HOSPITAL_BASED_OUTPATIENT_CLINIC_OR_DEPARTMENT_OTHER): Payer: BLUE CROSS/BLUE SHIELD | Admitting: Oncology

## 2017-10-25 VITALS — BP 123/99 | HR 109 | Temp 98.9°F | Resp 18 | Ht 68.0 in | Wt 183.9 lb

## 2017-10-25 DIAGNOSIS — C3492 Malignant neoplasm of unspecified part of left bronchus or lung: Secondary | ICD-10-CM

## 2017-10-25 DIAGNOSIS — G8929 Other chronic pain: Secondary | ICD-10-CM

## 2017-10-25 DIAGNOSIS — C3412 Malignant neoplasm of upper lobe, left bronchus or lung: Secondary | ICD-10-CM

## 2017-10-25 DIAGNOSIS — R590 Localized enlarged lymph nodes: Secondary | ICD-10-CM | POA: Diagnosis not present

## 2017-10-25 DIAGNOSIS — C787 Secondary malignant neoplasm of liver and intrahepatic bile duct: Secondary | ICD-10-CM | POA: Diagnosis not present

## 2017-10-25 DIAGNOSIS — M545 Low back pain: Secondary | ICD-10-CM

## 2017-10-25 DIAGNOSIS — C7931 Secondary malignant neoplasm of brain: Secondary | ICD-10-CM | POA: Diagnosis not present

## 2017-10-25 DIAGNOSIS — Z5111 Encounter for antineoplastic chemotherapy: Secondary | ICD-10-CM

## 2017-10-25 LAB — CBC WITH DIFFERENTIAL/PLATELET
BASOS PCT: 0 %
Basophils Absolute: 0 10*3/uL (ref 0.0–0.1)
Eosinophils Absolute: 0 10*3/uL (ref 0.0–0.5)
Eosinophils Relative: 0 %
HEMATOCRIT: 42 % (ref 34.8–46.6)
HEMOGLOBIN: 14.1 g/dL (ref 11.6–15.9)
Lymphocytes Relative: 14 %
Lymphs Abs: 1 10*3/uL (ref 0.9–3.3)
MCH: 33.2 pg (ref 25.1–34.0)
MCHC: 33.6 g/dL (ref 31.5–36.0)
MCV: 98.8 fL (ref 79.5–101.0)
Monocytes Absolute: 0.5 10*3/uL (ref 0.1–0.9)
Monocytes Relative: 6 %
NEUTROS ABS: 6.1 10*3/uL (ref 1.5–6.5)
NEUTROS PCT: 80 %
Platelets: 284 10*3/uL (ref 145–400)
RBC: 4.25 MIL/uL (ref 3.70–5.45)
RDW: 13.1 % (ref 11.2–14.5)
WBC: 7.6 10*3/uL (ref 3.9–10.3)

## 2017-10-25 LAB — COMPREHENSIVE METABOLIC PANEL
ALK PHOS: 123 U/L (ref 40–150)
ALT: 88 U/L — ABNORMAL HIGH (ref 0–55)
ANION GAP: 13 — AB (ref 3–11)
AST: 58 U/L — ABNORMAL HIGH (ref 5–34)
Albumin: 4.1 g/dL (ref 3.5–5.0)
BILIRUBIN TOTAL: 0.4 mg/dL (ref 0.2–1.2)
BUN: 10 mg/dL (ref 7–26)
CALCIUM: 10.2 mg/dL (ref 8.4–10.4)
CO2: 23 mmol/L (ref 22–29)
Chloride: 102 mmol/L (ref 98–109)
Creatinine, Ser: 0.8 mg/dL (ref 0.60–1.10)
GFR calc non Af Amer: >60 mL/min (ref >60)
Glucose, Bld: 123 mg/dL (ref 70–140)
Potassium: 4.4 mmol/L (ref 3.5–5.1)
SODIUM: 138 mmol/L (ref 136–145)
Total Protein: 8.1 g/dL (ref 6.4–8.3)

## 2017-10-25 LAB — TOTAL PROTEIN, URINE DIPSTICK: Protein, ur: <30 mg/dL — AB

## 2017-10-25 MED ORDER — OXYCODONE-ACETAMINOPHEN 5-325 MG PO TABS: 1.0000 | ORAL_TABLET | ORAL | 0 refills | Status: DC | PRN

## 2017-10-25 NOTE — Progress Notes (Signed)
[facility] OFFICE PROGRESS NOTE  Hulan Fess, MD [place] [address]  DIAGNOSIS: Stage IIIA (T2a, N2, M0) non-small cell lung cancer, adenocarcinoma presented with left suprahilar mass, mediastinal lymphadenopathy and suspicious pulmonary nodule in the left upper lobe diagnosed in November 2017. No actionable mutations on [employer] testing.  Biomarker Findings Microsatellite Status - MS-Stable Tumor Mutational Burden - TMB-Low (3 Muts/Mb) Genomic Findings For a complete list of the genes assayed, please refer to the Appendix. STK11 loss exons 1 AXL amplification - equivocal? HGF amplification - equivocal? KRAS L19F RICTOR amplification CHEK2 T374f*15 FGF10 amplification TP53 Q136P 7 Disease relevant genes with no reportable alterations: RET, ROS1, ALK, BRAF, ERBB2, MET, EGFR  PDL1 Expression 0%.   PRIOR THERAPY: 1) Status post concurrent chemoradiation with weekly carboplatin for AUC of 2 and paclitaxel 45 MG/M2 for 7 cycles. Last dose was given 06/13/2016. 2) Immunotherapy with Imfinzi (Durvalumab) 10 MG/KG every 2 weeks. First dose 08/11/2016. Status post 18 cycles.  Discontinued secondary to disease progression. 3) Systemic chemotherapy with carboplatin for AUC of 5, Alimta 500 mg/M2 and Avastin 15 mg/KG every 3 weeks status post 6 cycles.  Last dose was giving September 07, 2017 with a stable disease.  CURRENT THERAPY: Maintenance treatment with Alimta 500 mg/M2 and Avastin 15 mg/KG every 3 weeks.  First dose Oct 05, 2017.  Status post 1 cycle.  INTERVAL HISTORY: Debbie RUNDELL52y.o. female returns for routine follow-up visit by herself.  The patient reports that she is having increasing pain to her mid back and left ribs.  Remains on fentanyl 25 mcg/h but reports that the fentanyl only lasts 62 hours.  She also uses Percocet for breakthrough pain.  She takes it every 4 hours but reports that the Percocet only last about 3 hours.  She  uses Tylenol to help with the last hour pain before she can take another Percocet.  The patient has been referred to a pain clinic but has not yet received an appointment.  The patient denies fevers and chills.  Denies chest pain, shortness of breath, cough, hemoptysis.  Denies nausea, vomiting, constipation, diarrhea.  The patient is here for evaluation prior to cycle #2 of her maintenance treatment.  MEDICAL HISTORY: Past Medical History:  Diagnosis Date  . Allergic rhinitis   . Deaf, left   . Diverticulosis    sigmoid colon  . Dyspnea   . Encounter for antineoplastic chemotherapy 04/18/2016  . Encounter for antineoplastic immunotherapy 07/21/2016  . GERD (gastroesophageal reflux disease)   . Hyperlipidemia   . Lung cancer ([facility] 03/2016   non small cell lung cancer favoring adenocarcinoma   . Lung nodules   . Migraine   . Odynophagia 05/24/2016  . Pneumonia   . PONV (postoperative nausea and vomiting)   . Tachycardia, paroxysmal ([facility] 09/08/2016  . Wears glasses     ALLERGIES:  is allergic to penicillins and chantix [varenicline].  MEDICATIONS:  Current Outpatient Medications  Medication Sig Dispense Refill  . albuterol (PROVENTIL HFA;VENTOLIN HFA) 108 (90 Base) MCG/ACT inhaler Inhale 2 puffs into the lungs every 6 (six) hours as needed for wheezing or shortness of breath. 1 Inhaler 0  . benzonatate (TESSALON) 100 MG capsule Take 1 capsule (100 mg total) by mouth every 4 (four) hours as needed for cough. 60 capsule 1  . Calcium-Magnesium-Vitamin D 185-50-100 MG-MG-UNIT CAPS Take 1 tablet by mouth every evening.    . cetirizine (ZYRTEC) 10 MG tablet Take 10  mg by mouth daily.    Marland Kitchen dexamethasone (DECADRON) 4 MG tablet Take 1 tablet twice a day the day before, day of, and day after each cycle of chemotherapy. 30 tablet 1  . diphenhydrAMINE (BENADRYL) 25 mg capsule Take 25 mg by mouth at bedtime as needed.    Marland Kitchen ELDERBERRY PO Take by mouth every morning.    . fentaNYL (DURAGESIC -  DOSED MCG/HR) 25 MCG/HR patch Place 1 patch (25 mcg total) onto the skin every 3 (three) days. 5 patch 0  . folic acid (FOLVITE) 1 MG tablet Take 1 tablet (1 mg total) by mouth daily. 30 tablet 4  . guaiFENesin (MUCINEX) 600 MG 12 hr tablet Take 600 mg by mouth at bedtime as needed.    . Lactobacillus (PROBIOTIC ACIDOPHILUS) CAPS Take by mouth.    . lidocaine-prilocaine (EMLA) cream Apply 1 application topically as needed. 30 g 2  . LORazepam (ATIVAN) 0.5 MG tablet Take 1 tablet (0.5 mg total) by mouth as needed for anxiety (take 30 min prior to MRI scans and treatments). 30 tablet 0  . Milk Thistle 1000 MG CAPS Take 1 capsule by mouth daily.    . Misc Natural Products (TURMERIC CURCUMIN) CAPS Take 1 capsule by mouth every evening. 1500 mg capsule    . Multiple Vitamins-Minerals (WOMENS 50+ MULTI VITAMIN/MIN) TABS Take 1 tablet by mouth daily.    Marland Kitchen OVER THE COUNTER MEDICATION Take 250 mg by mouth daily. Jiaogulan Supplement    . oxyCODONE-acetaminophen (PERCOCET/ROXICET) 5-325 MG tablet Take 1 tablet by mouth every 4 (four) hours as needed (Chronic pain management). 60 tablet 0  . pantoprazole (PROTONIX) 40 MG tablet Take 1 tablet (40 mg total) 2 (two) times daily by mouth. 180 tablet 3  . pravastatin (PRAVACHOL) 20 MG tablet Take 20 mg by mouth every evening.  3  . prochlorperazine (COMPAZINE) 10 MG tablet Take 1 tablet (10 mg total) by mouth every 6 (six) hours as needed for nausea or vomiting. (Patient not taking: Reported on 08/17/2017) 30 tablet 0  . PSYLLIUM HUSK PO Take 1 capsule by mouth 2 (two) times daily.      No current facility-administered medications for this visit.    Facility-Administered Medications Ordered in Other Visits  Medication Dose Route Frequency Provider Last Rate Last Dose  . sodium chloride flush (NS) 0.9 % injection 10 mL  10 mL Intracatheter PRN Curt Bears, MD      . sodium chloride flush (NS) 0.9 % injection 10 mL  10 mL Intravenous PRN Curt Bears, MD    10 mL at 09/21/17 1103    SURGICAL HISTORY:  Past Surgical History:  Procedure Laterality Date  . ABDOMINAL HYSTERECTOMY    . IR FLUORO GUIDE PORT INSERTION RIGHT  06/26/2017  . IR US GUIDE VASC ACCESS RIGHT  06/26/2017  . VIDEO BRONCHOSCOPY Bilateral 07/03/2015   Procedure: VIDEO BRONCHOSCOPY WITH FLUORO;  Surgeon: Marshell Garfinkel, MD;  Location: [facility];  Service: Cardiopulmonary;  Laterality: Bilateral;  . VIDEO BRONCHOSCOPY WITH ENDOBRONCHIAL ULTRASOUND N/A 04/06/2016   Procedure: VIDEO BRONCHOSCOPY WITH ENDOBRONCHIAL ULTRASOUND;  Surgeon: Collene Gobble, MD;  Location: [facility];  Service: Thoracic;  Laterality: N/A;  . WRIST FRACTURE SURGERY      REVIEW OF SYSTEMS:   Review of Systems  Constitutional: Negative for appetite change, chills, fatigue, fever.  Positive for weight loss. HENT:   Negative for mouth sores, nosebleeds, sore throat and trouble swallowing.   Eyes: Negative for eye problems and icterus.  Respiratory: Negative for cough, hemoptysis, shortness of breath and wheezing.   Cardiovascular: Negative for chest pain and leg swelling.  Gastrointestinal: Negative for abdominal pain, constipation, diarrhea, nausea and vomiting.  Genitourinary: Negative for bladder incontinence, difficulty urinating, dysuria, frequency and hematuria.   Musculoskeletal: Positive for mid back pain and left rib pain.  Skin: Negative for itching and rash.  Neurological: Negative for dizziness, extremity weakness, gait problem, headaches, light-headedness and seizures.  Hematological: Negative for adenopathy. Does not bruise/bleed easily.  Psychiatric/Behavioral: Negative for confusion, depression and sleep disturbance. The patient is not nervous/anxious.     PHYSICAL EXAMINATION:  Blood pressure (!) 123/99, pulse (!) 109, temperature 98.9 F (37.2 C), temperature source Oral, resp. rate 18, height 5' 8"  (1.727 m), weight 183 lb 14.4 oz (83.4 kg), SpO2 98 %.  ECOG PERFORMANCE STATUS: 1 -  Symptomatic but completely ambulatory  Physical Exam  Constitutional: Oriented to person, place, and time.  HENT:  Head: Normocephalic and atraumatic.  Mouth/Throat: Oropharynx is clear and moist. No oropharyngeal exudate.  Eyes: Conjunctivae are normal. Right eye exhibits no discharge. Left eye exhibits no discharge. No scleral icterus.  Neck: Normal range of motion. Neck supple.  Cardiovascular: Normal rate, regular rhythm, normal heart sounds and intact distal pulses.   Pulmonary/Chest: Effort normal and breath sounds normal. No respiratory distress. No wheezes. No rales.  Abdominal: Soft. Bowel sounds are normal. Exhibits no distension and no mass. There is no tenderness.  Musculoskeletal: Normal range of motion. Exhibits no edema.  Lymphadenopathy:    No cervical adenopathy.  Neurological: Alert and oriented to person, place, and time. Exhibits normal muscle tone. Gait normal. Coordination normal.  Skin: Skin is warm and dry. No rash noted. Not diaphoretic. No erythema. No pallor.  Psychiatric: Mood, memory and judgment normal. Tearful at times due to pain. Vitals reviewed.  LABORATORY DATA: Lab Results  Component Value Date   WBC 7.6 10/25/2017   HGB 14.1 10/25/2017   HCT 42.0 10/25/2017   MCV 98.8 10/25/2017   PLT 284 10/25/2017      Chemistry      Component Value Date/Time   NA 138 10/25/2017 1424   NA 140 05/22/2017 1212   K 4.4 10/25/2017 1424   K 4.2 05/22/2017 1212   CL 102 10/25/2017 1424   CO2 23 10/25/2017 1424   CO2 21 (L) 05/22/2017 1212   BUN 10 10/25/2017 1424   BUN 10.3 05/22/2017 1212   CREATININE 0.80 10/25/2017 1424   CREATININE 0.8 05/22/2017 1212      Component Value Date/Time   CALCIUM 10.1 10/05/2017 0748   CALCIUM 9.6 05/22/2017 1212   ALKPHOS 103 10/05/2017 0748   ALKPHOS 113 05/22/2017 1212   AST 56 (H) 10/05/2017 0748   AST 17 05/22/2017 1212   ALT 65 (H) 10/05/2017 0748   ALT 27 05/22/2017 1212   BILITOT 0.5 10/05/2017 0748    BILITOT 0.31 05/22/2017 1212       RADIOGRAPHIC STUDIES:  No results found.   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 The patient is currently undergoing consolidation immunotherapy with Imfinzi (Durvalumab) status post 18 cycles. This treatment was discontinued secondary to disease progression and new liver metastasis. Her molecular studies showed no evidence for actionable mutations. The patient was started on systemic chemotherapy with carboplatin, Alimta  and Avastin status post 6 cycles.   The patient tolerated the induction course of chemotherapy fairly well.  She is now on treatment with maintenance chemotherapy with Alimta 500 mg/M2 and Avastin 15 mg/KG every 3 weeks.  Status post 1 cycle which she tolerated fairly well.  The patient was seen with Dr. Julien Nordmann.  Labs reviewed.  Liver enzymes are slightly elevated but okay to proceed with cycle 2 of her maintenance treatment as scheduled tomorrow.  She is having increased pain to her mid back.  We discussed proceeding with a MRI of the thoracic spine to evaluate for bone metastasis.  Discussed that she should follow-up with pain clinic as previously referred.  She may continue fentanyl and Percocet.  She was given a refill of her Percocet today.  We discussed that she may use ibuprofen up to 3 tablets/day but not on the 3 to 4 days surrounding her chemotherapy to protect her kidneys. The patient will follow-up tomorrow at 8 AM for her chemotherapy at our [facility] office.  She will have a follow-up visit with [place] in approximately 3 weeks for evaluation prior to cycle #3 of her maintenance treatment.  She was advised to call immediately if she has any concerning symptoms in the interval. The patient voices  understanding of current disease status and treatment options and is in agreement with the current care plan. All questions were answered. The patient knows to call the clinic with any problems, questions or concerns. We can certainly see the patient much sooner if necessary.   Orders Placed This Encounter  Procedures  . MR THORACIC SPINE W WO CONTRAST    Standing Status:   Future    Standing Expiration Date:   12/27/2018    Order Specific Question:   GRA to provide read?    Answer:   Yes    Order Specific Question:   If indicated for the ordered procedure, I authorize the administration of contrast media per Radiology protocol    Answer:   Yes    Order Specific Question:   What is the patient's sedation requirement?    Answer:   Anti-anxiety    Order Specific Question:   Use SRS Protocol?    Answer:   No    Order Specific Question:   Does the patient have a pacemaker or implanted devices?    Answer:   No    Order Specific Question:   Preferred imaging location?    Answer:   [facility], [facility] (table limit 350lbs)    Order Specific Question:   Radiology Contrast Protocol - do NOT remove file path    Answer:   [email or web address]    Order Specific Question:   Reason for Exam additional comments    Answer:   Lung cancer. Increased mid-back pain. Evaluate for bone mets.   Mikey Bussing, DNP, AGPCNP-BC, AOCNP 10/25/17  ADDENDUM: Hematology/Oncology Attending: I had a face-to-face encounter with the patient.  I recommended her care plan.  This is a very pleasant 52 years old white female with metastatic non-small cell lung cancer, adenocarcinoma.  This was initially diagnosed as a stage IIIa status post a course of concurrent chemoradiation with partial response followed by consolidation treatment with immunotherapy was Imfinzi (Durvalumab) for 18 cycles discontinued secondary to disease progression.  The patient was a started on systemic chemotherapy with  carboplatin, Alimta and Avastin for 6 cycles and she is currently on maintenance treatment with Alimta and Avastin status post 1 cycle.  She  tolerated the first cycle of her maintenance treatment fairly well.  Unfortunately she continues to complain of pain at the lower rib cage bilaterally.  There was no clear explanation on her previous scans for her severe pain.  I recommended for the patient to have MRI of the thoracic spine to rule out any metastatic disease in her spine causing the pain. The patient was also referred to pain clinic for evaluation and management of her condition.  For pain management, she will continue on fentanyl patch and Percocet for now. She will proceed with cycle #2 today as scheduled She will come back for follow-up visit in 3 weeks for evaluation before the next cycle of her treatment. The patient was advised to call immediately if she has any concerning symptoms in the interval.  Disclaimer: This note was dictated with voice recognition software. Similar sounding words can inadvertently be transcribed and may be missed upon review. Eilleen Kempf, MD 10/26/17

## 2017-10-26 ENCOUNTER — Inpatient Hospital Stay: Payer: BLUE CROSS/BLUE SHIELD | Admitting: Nutrition

## 2017-10-26 ENCOUNTER — Inpatient Hospital Stay: Payer: BLUE CROSS/BLUE SHIELD

## 2017-10-26 ENCOUNTER — Telehealth: Payer: Self-pay | Admitting: Medical Oncology

## 2017-10-26 ENCOUNTER — Encounter: Payer: Self-pay | Admitting: Oncology

## 2017-10-26 ENCOUNTER — Other Ambulatory Visit: Payer: BLUE CROSS/BLUE SHIELD

## 2017-10-26 ENCOUNTER — Other Ambulatory Visit: Payer: Self-pay

## 2017-10-26 ENCOUNTER — Ambulatory Visit: Payer: BLUE CROSS/BLUE SHIELD | Admitting: Nurse Practitioner

## 2017-10-26 VITALS — BP 139/87 | HR 95 | Temp 98.6°F

## 2017-10-26 DIAGNOSIS — C3492 Malignant neoplasm of unspecified part of left bronchus or lung: Secondary | ICD-10-CM

## 2017-10-26 DIAGNOSIS — C3412 Malignant neoplasm of upper lobe, left bronchus or lung: Secondary | ICD-10-CM | POA: Diagnosis not present

## 2017-10-26 MED ORDER — SODIUM CHLORIDE 0.9% FLUSH
10.0000 mL | INTRAVENOUS | Status: DC | PRN
  Administered 2017-10-26: 10 mL
  Filled 2017-10-26: qty 10

## 2017-10-26 MED ORDER — PROCHLORPERAZINE MALEATE 10 MG PO TABS
10.0000 mg | ORAL_TABLET | Freq: Once | ORAL | Status: AC
  Administered 2017-10-26: 10 mg via ORAL

## 2017-10-26 MED ORDER — HEPARIN SOD (PORK) LOCK FLUSH 100 UNIT/ML IV SOLN
500.0000 [IU] | Freq: Once | INTRAVENOUS | Status: AC | PRN
  Administered 2017-10-26: 500 [IU]
  Filled 2017-10-26: qty 5

## 2017-10-26 MED ORDER — PROCHLORPERAZINE MALEATE 10 MG PO TABS
ORAL_TABLET | ORAL | Status: AC
  Filled 2017-10-26: qty 1

## 2017-10-26 MED ORDER — SODIUM CHLORIDE 0.9 % IV SOLN
Freq: Once | INTRAVENOUS | Status: AC
  Administered 2017-10-26: 08:00:00 via INTRAVENOUS

## 2017-10-26 MED ORDER — SODIUM CHLORIDE 0.9 % IV SOLN
15.0000 mg/kg | Freq: Once | INTRAVENOUS | Status: AC
  Administered 2017-10-26: 1300 mg via INTRAVENOUS
  Filled 2017-10-26: qty 20

## 2017-10-26 MED ORDER — SODIUM CHLORIDE 0.9 % IV SOLN
490.0000 mg/m2 | Freq: Once | INTRAVENOUS | Status: AC
  Administered 2017-10-26: 1000 mg via INTRAVENOUS
  Filled 2017-10-26: qty 40

## 2017-10-26 NOTE — Assessment & Plan Note (Addendum)
This is a very pleasant 52 year old white female with unresectable a stage IIIA non-small cell lung cancer, adenocarcinoma status post a course of concurrent chemoradiation with partial response. The patient is currently undergoing consolidation immunotherapy with Imfinzi (Durvalumab) status post 18 cycles. This treatment was discontinued secondary to disease progression and new liver metastasis. Her molecular studies showed no evidence for actionable mutations. The patient was started on systemic chemotherapy with carboplatin, Alimta and Avastin status post 6 cycles.   The patient tolerated the induction course of chemotherapy fairly well.  She is now on treatment with maintenance chemotherapy with Alimta 500 mg/M2 and Avastin 15 mg/KG every 3 weeks.  Status post 1 cycle which she tolerated fairly well.  The patient was seen with Dr. Julien Nordmann.  Labs reviewed.  Liver enzymes are slightly elevated but okay to proceed with cycle 2 of her maintenance treatment as scheduled tomorrow.  She is having increased pain to her mid back.  We discussed proceeding with a MRI of the thoracic spine to evaluate for bone metastasis.  Discussed that she should follow-up with pain clinic as previously referred.  She may continue fentanyl and Percocet.  She was given a refill of her Percocet today.  We discussed that she may use ibuprofen up to 3 tablets/day but not on the 3 to 4 days surrounding her chemotherapy to protect her kidneys. The patient will follow-up tomorrow at 8 AM for her chemotherapy at our [facility] office.  She will have a follow-up visit with [place] in approximately 3 weeks for evaluation prior to cycle #3 of her maintenance treatment.  She was advised to call immediately if she has any concerning symptoms in the interval. The patient voices understanding of current disease status and treatment options and is in agreement with the current care plan. All questions were answered. The patient knows to call  the clinic with any problems, questions or concerns. We can certainly see the patient much sooner if necessary.

## 2017-10-26 NOTE — Patient Instructions (Signed)
[facility] Discharge Instructions for Patients Receiving Chemotherapy  Today you received the following chemotherapy agents Avastin/Alimta To help prevent nausea and vomiting after your treatment, we encourage you to take your nausea medication as prescribed.  If you develop nausea and vomiting that is not controlled by your nausea medication, call the clinic.   BELOW ARE SYMPTOMS THAT SHOULD BE REPORTED IMMEDIATELY:  *FEVER GREATER THAN 100.5 F  *CHILLS WITH OR WITHOUT FEVER  NAUSEA AND VOMITING THAT IS NOT CONTROLLED WITH YOUR NAUSEA MEDICATION  *UNUSUAL SHORTNESS OF BREATH  *UNUSUAL BRUISING OR BLEEDING  TENDERNESS IN MOUTH AND THROAT WITH OR WITHOUT PRESENCE OF ULCERS  *URINARY PROBLEMS  *BOWEL PROBLEMS  UNUSUAL RASH Items with * indicate a potential emergency and should be followed up as soon as possible.  Feel free to call the clinic should you have any questions or concerns. The clinic phone number is [phone].  Please show the [facility] at check-in to the Emergency Department and triage nurse.

## 2017-10-26 NOTE — Telephone Encounter (Signed)
Declines referral for pain clinic. Referred to Dr Lovenia Shuck.

## 2017-10-26 NOTE — Progress Notes (Signed)
Patient complains of left rib pain. Patient rates the pain a 10 on the 0 to 10 pain scale. Patient states she took her home prescription of 1 percocet at 7 am. Patient took one Percocet of home prescription at 1100 today in the infusion room. Dr. Julien Nordmann made aware.  Patient was discharged at 1104 ambulatory stating she had a ride to pick her up.

## 2017-10-28 ENCOUNTER — Ambulatory Visit (HOSPITAL_BASED_OUTPATIENT_CLINIC_OR_DEPARTMENT_OTHER)
Admission: RE | Admit: 2017-10-28 | Discharge: 2017-10-28 | Disposition: A | Discharge status: Home / Self Care | Payer: BLUE CROSS/BLUE SHIELD | Source: Ambulatory Visit | Attending: Oncology | Admitting: Oncology

## 2017-10-28 ENCOUNTER — Encounter (HOSPITAL_BASED_OUTPATIENT_CLINIC_OR_DEPARTMENT_OTHER): Payer: Self-pay

## 2017-10-28 DIAGNOSIS — C787 Secondary malignant neoplasm of liver and intrahepatic bile duct: Principal | ICD-10-CM

## 2017-10-28 DIAGNOSIS — C3492 Malignant neoplasm of unspecified part of left bronchus or lung: Secondary | ICD-10-CM

## 2017-10-30 ENCOUNTER — Telehealth: Payer: Self-pay | Admitting: Medical Oncology

## 2017-10-30 NOTE — Telephone Encounter (Signed)
MRI cancelled. Pt  sobbing that she could not lay flat in MRI on Saturday due to pain. She took fentanyl 25 mcg patch as ordered and percocet prior to mri and could not do test. She requested IV pain medication to "knock me out "  during scan.

## 2017-10-30 NOTE — Telephone Encounter (Signed)
Let Debbie Baker hold on the MRI for now until she sees the pain clinic.

## 2017-11-02 ENCOUNTER — Telehealth: Payer: Self-pay | Admitting: Medical Oncology

## 2017-11-02 NOTE — Telephone Encounter (Signed)
Pt requests to be transitioned to [facility] for future care. She is at Pain clinic now, Dr . Leighton Roach.

## 2017-11-02 NOTE — Telephone Encounter (Signed)
OK with me.

## 2017-11-06 ENCOUNTER — Other Ambulatory Visit: Payer: Self-pay | Admitting: *Deleted

## 2017-11-06 DIAGNOSIS — C3492 Malignant neoplasm of unspecified part of left bronchus or lung: Secondary | ICD-10-CM

## 2017-11-06 DIAGNOSIS — R590 Localized enlarged lymph nodes: Secondary | ICD-10-CM

## 2017-11-06 MED ORDER — FENTANYL 25 MCG/HR TD PT72: 25.0000 ug | MEDICATED_PATCH | TRANSDERMAL | 0 refills | Status: DC

## 2017-11-06 MED ORDER — OXYCODONE-ACETAMINOPHEN 5-325 MG PO TABS: 1.0000 | ORAL_TABLET | ORAL | 0 refills | Status: DC | PRN

## 2017-11-06 MED FILL — OXYCODONE-ACETAMINOPHEN 5-3: 5-325 | 10 days supply | Qty: 60 | Fill #0

## 2017-11-08 ENCOUNTER — Other Ambulatory Visit: Payer: Self-pay | Admitting: *Deleted

## 2017-11-08 DIAGNOSIS — K121 Other forms of stomatitis: Secondary | ICD-10-CM

## 2017-11-08 MED ORDER — BIOTENE DRY MOUTH MT LIQD: 15.0000 mL | OROMUCOSAL | 3 refills | Status: AC | PRN

## 2017-11-08 MED ORDER — MAGIC MOUTHWASH: 5.0000 mL | Freq: Three times a day (TID) | ORAL | 0 refills | Status: AC

## 2017-11-08 MED FILL — BIOTENE DRY MOUTH ORAL RINS: 16 days supply | Qty: 237 | Fill #0

## 2017-11-08 MED FILL — MAGIC MOUTHWASH BOP FORM: 12 days supply | Qty: 240 | Fill #0

## 2017-11-09 MED FILL — fentaNYL 25 MCG/HR PT72: 25 | 15 days supply | Qty: 5 | Fill #0

## 2017-11-15 ENCOUNTER — Telehealth: Payer: Self-pay | Admitting: *Deleted

## 2017-11-15 ENCOUNTER — Telehealth: Payer: Self-pay

## 2017-11-15 NOTE — Telephone Encounter (Signed)
Pt called [facility] request for pain medication.the patient advised her call back # is [phone]. Returned call to pt, rcvd recording "voice mail has not been set up"

## 2017-11-15 NOTE — Telephone Encounter (Signed)
Message on Triage RN Line  Patient will be transferring care to Dr Marin Olp from Dr Earlie Server next week. She states she needs refills on her pain medication. She does not mention which medications by name.  Patient was informed last week that while we had provided some refills for patient, we could no longer do this until she actually established care in this office with Dr Marin Olp as Dr Marin Olp has not yet seen patient. She understood.   Medications that were sent for refill include fentanyl and percocet, both sent 11/06/2017. Confirmed with pharmacy that both were picked up.   Attempted to call patient back however there was no message and voice mail had not been set up.  Spoke to Dr Lew Dawes nurse and he will no longer provide prescriptions for patient since patient has terminated care. Spoke with clinic [facility]. She states that since this office has not yet, assumed care, or seen the patient, we cannot provide the patient with any additional narcotic prescriptions. The first ones sent, were sent in error.   Patient is aware of office policy. We will assume care at patient's appointment next Thursday.

## 2017-11-15 NOTE — Telephone Encounter (Signed)
Patient had called earlier asking for a refill on her pain medication. MD Julien Nordmann was contacted by Erenest Blank RN and he stated he refilled her Percocet for the last time last on 11/05/17 since she chose to transfer her cancer care to [facility] and has an appointment with [place] on 11/23/17. She has NOT been under the care of [facility] so far at this point, she has been here at the cancer center a few times for treatment but the treatment was ordered by [facility] and it was his care plan. Patient is away on vacation in [place] [facility] and wanted Percocet sent there or was going to have a friend pick it up and drive it down tomorrow. She was informed since we were NOT her treating doctor until next week, we cannot legally prescribe Percocet without the MD seeing the patient. She expressed her dissatisfaction with this result and I apologized but we will be happy to begin caring for her when she returns to [place] next week and we establish care. She said she "is going to report me" and "I am disgusted no one is taking responsibility for this".  I advised her to seek medical care where she is if she has an immediate medical need.

## 2017-11-22 ENCOUNTER — Other Ambulatory Visit: Payer: Self-pay | Admitting: *Deleted

## 2017-11-22 DIAGNOSIS — C3492 Malignant neoplasm of unspecified part of left bronchus or lung: Secondary | ICD-10-CM

## 2017-11-23 ENCOUNTER — Other Ambulatory Visit: Payer: Self-pay

## 2017-11-23 ENCOUNTER — Inpatient Hospital Stay: Payer: BLUE CROSS/BLUE SHIELD | Attending: Internal Medicine | Admitting: Hematology & Oncology

## 2017-11-23 ENCOUNTER — Other Ambulatory Visit: Payer: BLUE CROSS/BLUE SHIELD

## 2017-11-23 ENCOUNTER — Inpatient Hospital Stay: Payer: BLUE CROSS/BLUE SHIELD

## 2017-11-23 ENCOUNTER — Ambulatory Visit: Payer: BLUE CROSS/BLUE SHIELD

## 2017-11-23 ENCOUNTER — Encounter: Payer: Self-pay | Admitting: Hematology & Oncology

## 2017-11-23 ENCOUNTER — Ambulatory Visit: Payer: BLUE CROSS/BLUE SHIELD | Admitting: Nurse Practitioner

## 2017-11-23 VITALS — BP 147/89 | HR 83 | Temp 98.1°F | Resp 18 | Wt 177.0 lb

## 2017-11-23 VITALS — BP 147/89 | HR 83 | Temp 98.1°F | Resp 18

## 2017-11-23 DIAGNOSIS — I10 Essential (primary) hypertension: Secondary | ICD-10-CM | POA: Diagnosis not present

## 2017-11-23 DIAGNOSIS — C3412 Malignant neoplasm of upper lobe, left bronchus or lung: Secondary | ICD-10-CM | POA: Diagnosis not present

## 2017-11-23 DIAGNOSIS — R0789 Other chest pain: Secondary | ICD-10-CM | POA: Diagnosis not present

## 2017-11-23 DIAGNOSIS — C3492 Malignant neoplasm of unspecified part of left bronchus or lung: Secondary | ICD-10-CM

## 2017-11-23 DIAGNOSIS — Z79899 Other long term (current) drug therapy: Secondary | ICD-10-CM | POA: Diagnosis not present

## 2017-11-23 DIAGNOSIS — M542 Cervicalgia: Secondary | ICD-10-CM | POA: Insufficient documentation

## 2017-11-23 DIAGNOSIS — Z5111 Encounter for antineoplastic chemotherapy: Secondary | ICD-10-CM | POA: Insufficient documentation

## 2017-11-23 DIAGNOSIS — C7951 Secondary malignant neoplasm of bone: Secondary | ICD-10-CM | POA: Insufficient documentation

## 2017-11-23 DIAGNOSIS — G893 Neoplasm related pain (acute) (chronic): Secondary | ICD-10-CM | POA: Diagnosis not present

## 2017-11-23 DIAGNOSIS — M452 Ankylosing spondylitis of cervical region: Secondary | ICD-10-CM

## 2017-11-23 DIAGNOSIS — Z5112 Encounter for antineoplastic immunotherapy: Secondary | ICD-10-CM | POA: Diagnosis not present

## 2017-11-23 DIAGNOSIS — C787 Secondary malignant neoplasm of liver and intrahepatic bile duct: Principal | ICD-10-CM

## 2017-11-23 DIAGNOSIS — M549 Dorsalgia, unspecified: Secondary | ICD-10-CM

## 2017-11-23 LAB — CBC WITH DIFFERENTIAL (CANCER CENTER ONLY)
Basophils Absolute: 0 10*3/uL (ref 0.0–0.1)
Basophils Relative: 0 %
Eosinophils Absolute: 0 10*3/uL (ref 0.0–0.5)
Eosinophils Relative: 0 %
HCT: 42.3 % (ref 34.8–46.6)
Hemoglobin: 14 g/dL (ref 11.6–15.9)
Lymphocytes Relative: 12 %
Lymphs Abs: 0.9 10*3/uL (ref 0.9–3.3)
MCH: 32.5 pg (ref 26.0–34.0)
MCHC: 33.1 g/dL (ref 32.0–36.0)
MCV: 98.1 fL (ref 81.0–101.0)
Monocytes Absolute: 0.5 10*3/uL (ref 0.1–0.9)
Monocytes Relative: 7 %
Neutro Abs: 6 10*3/uL (ref 1.5–6.5)
Neutrophils Relative %: 81 %
Platelet Count: 207 10*3/uL (ref 145–400)
RBC: 4.31 MIL/uL (ref 3.70–5.32)
RDW: 13 % (ref 11.1–15.7)
WBC Count: 7.4 10*3/uL (ref 3.9–10.0)

## 2017-11-23 LAB — CMP (CANCER CENTER ONLY)
ALBUMIN: 4 g/dL (ref 3.5–5.0)
ALT: 57 U/L — ABNORMAL HIGH (ref 10–47)
AST: 59 U/L — AB (ref 11–38)
Alkaline Phosphatase: 150 U/L — ABNORMAL HIGH (ref 26–84)
Anion gap: 10 (ref 5–15)
BILIRUBIN TOTAL: 0.7 mg/dL (ref 0.2–1.6)
BUN: 9 mg/dL (ref 7–22)
CALCIUM: 9.8 mg/dL (ref 8.0–10.3)
CHLORIDE: 101 mmol/L (ref 98–108)
CO2: 28 mmol/L (ref 18–33)
CREATININE: 0.7 mg/dL (ref 0.60–1.20)
GLUCOSE: 113 mg/dL (ref 73–118)
Potassium: 4.2 mmol/L (ref 3.3–4.7)
SODIUM: 139 mmol/L (ref 128–145)
Total Protein: 7.6 g/dL (ref 6.4–8.1)

## 2017-11-23 MED ORDER — DEXAMETHASONE 4 MG PO TABS: ORAL_TABLET | ORAL | 4 refills | Status: DC

## 2017-11-23 MED ORDER — HEPARIN SOD (PORK) LOCK FLUSH 100 UNIT/ML IV SOLN
500.0000 [IU] | Freq: Once | INTRAVENOUS | Status: AC | PRN
  Administered 2017-11-23: 500 [IU]
  Filled 2017-11-23: qty 5

## 2017-11-23 MED ORDER — SODIUM CHLORIDE 0.9 % IV SOLN
Freq: Once | INTRAVENOUS | Status: AC
  Administered 2017-11-23: 13:00:00 via INTRAVENOUS

## 2017-11-23 MED ORDER — PROCHLORPERAZINE MALEATE 10 MG PO TABS
10.0000 mg | ORAL_TABLET | Freq: Once | ORAL | Status: AC
  Administered 2017-11-23: 10 mg via ORAL

## 2017-11-23 MED ORDER — OXYCODONE HCL 10 MG PO TABS: 10.0000 mg | ORAL_TABLET | Freq: Four times a day (QID) | ORAL | 0 refills | Status: DC | PRN

## 2017-11-23 MED ORDER — FENTANYL 50 MCG/HR TD PT72
50.0000 ug | MEDICATED_PATCH | TRANSDERMAL | 0 refills | Status: DC
Start: 2017-11-23 — End: 2017-12-19

## 2017-11-23 MED ORDER — PROCHLORPERAZINE MALEATE 10 MG PO TABS
ORAL_TABLET | ORAL | Status: AC
  Filled 2017-11-23: qty 1

## 2017-11-23 MED ORDER — SODIUM CHLORIDE 0.9 % IV SOLN
490.0000 mg/m2 | Freq: Once | INTRAVENOUS | Status: AC
  Administered 2017-11-23: 1000 mg via INTRAVENOUS
  Filled 2017-11-23: qty 40

## 2017-11-23 MED ORDER — KETOROLAC TROMETHAMINE 15 MG/ML IJ SOLN
30.0000 mg | Freq: Once | INTRAMUSCULAR | Status: AC
  Administered 2017-11-23: 30 mg via INTRAVENOUS
  Filled 2017-11-23: qty 2

## 2017-11-23 MED ORDER — SODIUM CHLORIDE 0.9% FLUSH
10.0000 mL | INTRAVENOUS | Status: DC | PRN
  Filled 2017-11-23: qty 10

## 2017-11-23 MED ORDER — SODIUM CHLORIDE 0.9% FLUSH
10.0000 mL | INTRAVENOUS | Status: DC | PRN
  Administered 2017-11-23: 10 mL via INTRAVENOUS
  Filled 2017-11-23: qty 10

## 2017-11-23 MED ORDER — KETOROLAC TROMETHAMINE 15 MG/ML IJ SOLN
INTRAMUSCULAR | Status: AC
  Filled 2017-11-23: qty 2

## 2017-11-23 MED ORDER — SODIUM CHLORIDE 0.9 % IV SOLN
15.0000 mg/kg | Freq: Once | INTRAVENOUS | Status: AC
  Administered 2017-11-23: 1300 mg via INTRAVENOUS
  Filled 2017-11-23: qty 48

## 2017-11-23 MED ORDER — DRONABINOL 5 MG PO CAPS: 5.0000 mg | ORAL_CAPSULE | Freq: Two times a day (BID) | ORAL | 0 refills | Status: DC

## 2017-11-23 MED ORDER — HEPARIN SOD (PORK) LOCK FLUSH 100 UNIT/ML IV SOLN
500.0000 [IU] | Freq: Once | INTRAVENOUS | Status: AC
  Administered 2017-11-23: 500 [IU] via INTRAVENOUS
  Filled 2017-11-23: qty 5

## 2017-11-23 NOTE — Patient Instructions (Signed)
Bevacizumab injection What is this medicine? BEVACIZUMAB (be va SIZ yoo mab) is a monoclonal antibody. It is used to treat many types of cancer. This medicine may be used for other purposes; ask your health care provider or pharmacist if you have questions. COMMON BRAND NAME(S): Avastin What should I tell my health care provider before I take this medicine? They need to know if you have any of these conditions: -diabetes -heart disease -high blood pressure -history of coughing up blood -prior anthracycline chemotherapy (e.g., doxorubicin, daunorubicin, epirubicin) -recent or ongoing radiation therapy -recent or planning to have surgery -stroke -an unusual or allergic reaction to bevacizumab, hamster proteins, mouse proteins, other medicines, foods, dyes, or preservatives -pregnant or trying to get pregnant -breast-feeding How should I use this medicine? This medicine is for infusion into a vein. It is given by a health care professional in a hospital or clinic setting. Talk to your pediatrician regarding the use of this medicine in children. Special care may be needed. Overdosage: If you think you have taken too much of this medicine contact a poison control center or emergency room at once. NOTE: This medicine is only for you. Do not share this medicine with others. What if I miss a dose? It is important not to miss your dose. Call your doctor or health care professional if you are unable to keep an appointment. What may interact with this medicine? Interactions are not expected. This list may not describe all possible interactions. Give your health care provider a list of all the medicines, herbs, non-prescription drugs, or dietary supplements you use. Also tell them if you smoke, drink alcohol, or use illegal drugs. Some items may interact with your medicine. What should I watch for while using this medicine? Your condition will be monitored carefully while you are receiving this  medicine. You will need important blood work and urine testing done while you are taking this medicine. This medicine may increase your risk to bruise or bleed. Call your doctor or health care professional if you notice any unusual bleeding. This medicine should be started at least 28 days following major surgery and the site of the surgery should be totally healed. Check with your doctor before scheduling dental work or surgery while you are receiving this treatment. Talk to your doctor if you have recently had surgery or if you have a wound that has not healed. Do not become pregnant while taking this medicine or for 6 months after stopping it. Women should inform their doctor if they wish to become pregnant or think they might be pregnant. There is a potential for serious side effects to an unborn child. Talk to your health care professional or pharmacist for more information. Do not breast-feed an infant while taking this medicine and for 6 months after the last dose. This medicine has caused ovarian failure in some women. This medicine may interfere with the ability to have a child. You should talk to your doctor or health care professional if you are concerned about your fertility. What side effects may I notice from receiving this medicine? Side effects that you should report to your doctor or health care professional as soon as possible: -allergic reactions like skin rash, itching or hives, swelling of the face, lips, or tongue -chest pain or chest tightness -chills -coughing up blood -high fever -seizures -severe constipation -signs and symptoms of bleeding such as bloody or black, tarry stools; red or dark-brown urine; spitting up blood or brown material that looks   like coffee grounds; red spots on the skin; unusual bruising or bleeding from the eye, gums, or nose -signs and symptoms of a blood clot such as breathing problems; chest pain; severe, sudden headache; pain, swelling, warmth in  the leg -signs and symptoms of a stroke like changes in vision; confusion; trouble speaking or understanding; severe headaches; sudden numbness or weakness of the face, arm or leg; trouble walking; dizziness; loss of balance or coordination -stomach pain -sweating -swelling of legs or ankles -vomiting -weight gain Side effects that usually do not require medical attention (report to your doctor or health care professional if they continue or are bothersome): -back pain -changes in taste -decreased appetite -dry skin -nausea -tiredness This list may not describe all possible side effects. Call your doctor for medical advice about side effects. You may report side effects to FDA at 1-800-FDA-1088. Where should I keep my medicine? This drug is given in a hospital or clinic and will not be stored at home. NOTE: This sheet is a summary. It may not cover all possible information. If you have questions about this medicine, talk to your doctor, pharmacist, or health care provider.  2018 Elsevier/Gold Standard (2016-05-13 14:33:29) Pemetrexed injection What is this medicine? PEMETREXED (PEM e TREX ed) is a chemotherapy drug used to treat lung cancers like non-small cell lung cancer and mesothelioma. It may also be used to treat other cancers. This medicine may be used for other purposes; ask your health care provider or pharmacist if you have questions. COMMON BRAND NAME(S): Alimta What should I tell my health care provider before I take this medicine? They need to know if you have any of these conditions: -infection (especially a virus infection such as chickenpox, cold sores, or herpes) -kidney disease -low blood counts, like low white cell, platelet, or red cell counts -lung or breathing disease, like asthma -radiation therapy -an unusual or allergic reaction to pemetrexed, other medicines, foods, dyes, or preservative -pregnant or trying to get pregnant -breast-feeding How should I use  this medicine? This drug is given as an infusion into a vein. It is administered in a hospital or clinic by a specially trained health care professional. Talk to your pediatrician regarding the use of this medicine in children. Special care may be needed. Overdosage: If you think you have taken too much of this medicine contact a poison control center or emergency room at once. NOTE: This medicine is only for you. Do not share this medicine with others. What if I miss a dose? It is important not to miss your dose. Call your doctor or health care professional if you are unable to keep an appointment. What may interact with this medicine? This medicine may interact with the following medications: -Ibuprofen This list may not describe all possible interactions. Give your health care provider a list of all the medicines, herbs, non-prescription drugs, or dietary supplements you use. Also tell them if you smoke, drink alcohol, or use illegal drugs. Some items may interact with your medicine. What should I watch for while using this medicine? Visit your doctor for checks on your progress. This drug may make you feel generally unwell. This is not uncommon, as chemotherapy can affect healthy cells as well as cancer cells. Report any side effects. Continue your course of treatment even though you feel ill unless your doctor tells you to stop. In some cases, you may be given additional medicines to help with side effects. Follow all directions for their use. Call your  doctor or health care professional for advice if you get a fever, chills or sore throat, or other symptoms of a cold or flu. Do not treat yourself. This drug decreases your body's ability to fight infections. Try to avoid being around people who are sick. This medicine may increase your risk to bruise or bleed. Call your doctor or health care professional if you notice any unusual bleeding. Be careful brushing and flossing your teeth or using a  toothpick because you may get an infection or bleed more easily. If you have any dental work done, tell your dentist you are receiving this medicine. Avoid taking products that contain aspirin, acetaminophen, ibuprofen, naproxen, or ketoprofen unless instructed by your doctor. These medicines may hide a fever. Call your doctor or health care professional if you get diarrhea or mouth sores. Do not treat yourself. To protect your kidneys, drink water or other fluids as directed while you are taking this medicine. Do not become pregnant while taking this medicine or for 6 months after stopping it. Women should inform their doctor if they wish to become pregnant or think they might be pregnant. Men should not father a child while taking this medicine and for 3 months after stopping it. This may interfere with the ability to father a child. You should talk to your doctor or health care professional if you are concerned about your fertility. There is a potential for serious side effects to an unborn child. Talk to your health care professional or pharmacist for more information. Do not breast-feed an infant while taking this medicine or for 1 week after stopping it. What side effects may I notice from receiving this medicine? Side effects that you should report to your doctor or health care professional as soon as possible: -allergic reactions like skin rash, itching or hives, swelling of the face, lips, or tongue -breathing problems -redness, blistering, peeling or loosening of the skin, including inside the mouth -signs and symptoms of bleeding such as bloody or black, tarry stools; red or dark-brown urine; spitting up blood or brown material that looks like coffee grounds; red spots on the skin; unusual bruising or bleeding from the eye, gums, or nose -signs and symptoms of infection like fever or chills; cough; sore throat; pain or trouble passing urine -signs and symptoms of kidney injury like trouble  passing urine or change in the amount of urine -signs and symptoms of liver injury like dark yellow or brown urine; general ill feeling or flu-like symptoms; light-colored stools; loss of appetite; nausea; right upper belly pain; unusually weak or tired; yellowing of the eyes or skin Side effects that usually do not require medical attention (report to your doctor or health care professional if they continue or are bothersome): -constipation -dizziness -mouth sores -nausea, vomiting -pain, tingling, numbness in the hands or feet -unusually weak or tired This list may not describe all possible side effects. Call your doctor for medical advice about side effects. You may report side effects to FDA at 1-800-FDA-1088. Where should I keep my medicine? This drug is given in a hospital or clinic and will not be stored at home. NOTE: This sheet is a summary. It may not cover all possible information. If you have questions about this medicine, talk to your doctor, pharmacist, or health care provider.  2018 Elsevier/Gold Standard (2016-03-15 18:51:46)

## 2017-11-23 NOTE — Patient Instructions (Signed)

## 2017-11-26 NOTE — Progress Notes (Signed)
Hematology and Oncology Follow Up Visit  Debbie Baker [id number] 02-09-1966 52 y.o. 11/26/2017   Principle Diagnosis:   Metastatic adenocarcinoma of the lung-no actionable mutations  Current Therapy:    Maintenance therapy with Alimta/Avastin     Interim History:  Debbie Baker is back for her first formal office visit.  She was being seen at the main cancer center by Dr. Julien Nordmann.  She really liked him.  However, she lives only about 5 minutes from the office.  Her past history is extensively noted by Dr. Julien Nordmann.  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  She has quite a bit of pain issues.  She is on a Duragesic patch.  She is on Percocet.  We really need to increase the Duragesic patch to 50 mcg.  I will change her Percocet to oxycodone.  We will give her 10 mg strength to take every 4-6 hours.  In the office today, she we will get some IV dexamethasone and IV Toradol.  She comes in with her husband.  They were down in [place].  She had a nice time down there although pain was a problem.  She is not on any type of bone hardener.  I think we have to add this to her protocol.  Her last scans were done back in April.  We may have to consider some additional scans on her to get a better idea as to what is going on.  She has had no problems with cough.  Her appetite is doing relatively well.  She is had no change in bowel or bladder habits.  She may be a little constipated from the pain medications.  There is no bleeding.  She has had a little nausea.  There is no leg weakness.  She is had no leg swelling.  She is had no fever.  Overall, her performance status is ECOG 1.  Medications:  Current Outpatient Medications:  .  acetaminophen (TYLENOL) 100 MG/ML solution, Take 10 mg/kg by mouth every 4 (four) hours as needed for fever., Disp: , Rfl:  .  albuterol (PROVENTIL HFA;VENTOLIN HFA) 108 (90 Base) MCG/ACT inhaler, Inhale 2 puffs into the lungs every 6 (six) hours as needed for wheezing  or shortness of breath., Disp: 1 Inhaler, Rfl: 0 .  antiseptic oral rinse (BIOTENE) LIQD, 15 mLs by Mouth Rinse route as needed for dry mouth., Disp: 473 mL, Rfl: 3 .  benzonatate (TESSALON) 100 MG capsule, Take 1 capsule (100 mg total) by mouth every 4 (four) hours as needed for cough., Disp: 60 capsule, Rfl: 1 .  Calcium-Magnesium-Vitamin D 185-50-100 MG-MG-UNIT CAPS, Take 1 tablet by mouth every evening., Disp: , Rfl:  .  cetirizine (ZYRTEC) 10 MG tablet, Take 10 mg by mouth daily., Disp: , Rfl:  .  dexamethasone (DECADRON) 4 MG tablet, Take 1 tablet twice a day the day before, day of, and day after each cycle of chemotherapy., Disp: 30 tablet, Rfl: 4 .  diphenhydrAMINE (BENADRYL) 25 mg capsule, Take 25 mg by mouth at bedtime as needed., Disp: , Rfl:  .  dronabinol (MARINOL) 5 MG capsule, Take 1 capsule (5 mg total) by mouth 2 (two) times daily before lunch and supper., Disp: 60 capsule, Rfl: 0 .  ELDERBERRY PO, Take by mouth every morning., Disp: , Rfl:  .  fentaNYL (DURAGESIC - DOSED MCG/HR) 50 MCG/HR, Place 1 patch (50 mcg total) onto the skin every 3 (three) days., Disp: 10 patch, Rfl: 0 .  folic acid (FOLVITE)  1 MG tablet, Take 1 tablet (1 mg total) by mouth daily., Disp: 30 tablet, Rfl: 4 .  guaiFENesin (MUCINEX) 600 MG 12 hr tablet, Take 600 mg by mouth at bedtime as needed., Disp: , Rfl:  .  Lactobacillus (PROBIOTIC ACIDOPHILUS) CAPS, Take by mouth., Disp: , Rfl:  .  lidocaine-prilocaine (EMLA) cream, Apply 1 application topically as needed., Disp: 30 g, Rfl: 2 .  LORazepam (ATIVAN) 0.5 MG tablet, Take 1 tablet (0.5 mg total) by mouth as needed for anxiety (take 30 min prior to MRI scans and treatments)., Disp: 30 tablet, Rfl: 0 .  magic mouthwash SOLN, Take 5 mLs by mouth 4 (four) times daily -  with meals and at bedtime. Called in 08/05/17 by on call RN, Components benadryl  525 mg, hydrocortisone 60 mg and nystatin 0.6 mg. 240 ml, Disp: 240 mL, Rfl: 0 .  Milk Thistle 1000 MG CAPS, Take 1  capsule by mouth daily., Disp: , Rfl:  .  Misc Natural Products (TURMERIC CURCUMIN) CAPS, Take 1 capsule by mouth every evening. 1500 mg capsule, Disp: , Rfl:  .  Multiple Vitamins-Minerals (WOMENS 50+ MULTI VITAMIN/MIN) TABS, Take 1 tablet by mouth daily., Disp: , Rfl:  .  OVER THE COUNTER MEDICATION, Take 250 mg by mouth daily. Jiaogulan Supplement, Disp: , Rfl:  .  oxyCODONE 10 MG TABS, Take 1 tablet (10 mg total) by mouth every 6 (six) hours as needed for severe pain., Disp: 120 tablet, Rfl: 0 .  pantoprazole (PROTONIX) 40 MG tablet, Take 1 tablet (40 mg total) 2 (two) times daily by mouth., Disp: 180 tablet, Rfl: 3 .  pravastatin (PRAVACHOL) 20 MG tablet, Take 20 mg by mouth every evening., Disp: , Rfl: 3 .  prochlorperazine (COMPAZINE) 10 MG tablet, Take 1 tablet (10 mg total) by mouth every 6 (six) hours as needed for nausea or vomiting., Disp: 30 tablet, Rfl: 0 .  PSYLLIUM HUSK PO, Take 1 capsule by mouth 2 (two) times daily. , Disp: , Rfl:  No current facility-administered medications for this visit.   Facility-Administered Medications Ordered in Other Visits:  .  sodium chloride flush (NS) 0.9 % injection 10 mL, 10 mL, Intracatheter, PRN, Curt Bears, MD .  sodium chloride flush (NS) 0.9 % injection 10 mL, 10 mL, Intravenous, PRN, Curt Bears, MD, 10 mL at 09/21/17 1103  Allergies:  Allergies  Allergen Reactions  . Penicillins Anaphylaxis and Rash     Has patient had a PCN reaction causing immediate rash, facial/tongue/throat swelling, SOB or lightheadedness with hypotension: # # YES # # Has patient had a PCN reaction causing severe rash involving mucus membranes or skin necrosis: No Has patient had a PCN reaction that required hospitalization No Has patient had a PCN reaction occurring within the last 10 years: # # YES # #  If all of the above answers are "NO", then may proceed with Cephalosporin use.   . Chantix [Varenicline] Other (See Comments)    Nightmares/lack of  sleep    Past Medical History, Surgical history, Social history, and Family History were reviewed and updated.  Review of Systems: Review of Systems  Constitutional: Negative.   HENT:  Negative.   Eyes: Negative.   Respiratory: Positive for chest tightness.   Cardiovascular: Negative.   Gastrointestinal: Positive for abdominal pain.  Genitourinary: Negative.    Musculoskeletal: Positive for back pain and neck pain.  Skin: Negative.   Neurological: Negative.   Hematological: Negative.   Psychiatric/Behavioral: Negative.     Physical  Exam:  weight is 177 lb (80.3 kg). Her oral temperature is 98.1 F (36.7 C). Her blood pressure is 147/89 (abnormal) and her pulse is 83. Her respiration is 18 and oxygen saturation is 99%.   Wt Readings from Last 3 Encounters:  11/23/17 177 lb (80.3 kg)  10/25/17 183 lb 14.4 oz (83.4 kg)  10/05/17 186 lb (84.4 kg)    Physical Exam  Constitutional: She is oriented to person, place, and time.  HENT:  Head: Normocephalic and atraumatic.  Mouth/Throat: Oropharynx is clear and moist.  Eyes: Pupils are equal, round, and reactive to light. EOM are normal.  Neck: Normal range of motion.  Cardiovascular: Normal rate, regular rhythm and normal heart sounds.  Pulmonary/Chest: Effort normal and breath sounds normal.  Abdominal: Soft. Bowel sounds are normal.  Musculoskeletal: Normal range of motion. She exhibits no edema, tenderness or deformity.  Lymphadenopathy:    She has no cervical adenopathy.  Neurological: She is alert and oriented to person, place, and time.  Skin: Skin is warm and dry. No rash noted. No erythema.  Psychiatric: She has a normal mood and affect. Her behavior is normal. Judgment and thought content normal.  Vitals reviewed.    Lab Results  Component Value Date   WBC 7.4 11/23/2017   HGB 14.0 11/23/2017   HCT 42.3 11/23/2017   MCV 98.1 11/23/2017   PLT 207 11/23/2017     Chemistry      Component Value Date/Time   NA  139 11/23/2017 1054   NA 140 05/22/2017 1212   K 4.2 11/23/2017 1054   K 4.2 05/22/2017 1212   CL 101 11/23/2017 1054   CO2 28 11/23/2017 1054   CO2 21 (L) 05/22/2017 1212   BUN 9 11/23/2017 1054   BUN 10.3 05/22/2017 1212   CREATININE 0.70 11/23/2017 1054   CREATININE 0.8 05/22/2017 1212      Component Value Date/Time   CALCIUM 9.8 11/23/2017 1054   CALCIUM 9.6 05/22/2017 1212   ALKPHOS 150 (H) 11/23/2017 1054   ALKPHOS 113 05/22/2017 1212   AST 59 (H) 11/23/2017 1054   AST 17 05/22/2017 1212   ALT 57 (H) 11/23/2017 1054   ALT 27 05/22/2017 1212   BILITOT 0.7 11/23/2017 1054   BILITOT 0.31 05/22/2017 1212       Impression and Plan: Ms. Dalby is a 52 year old white female with metastatic adenocarcinoma of the lung.  She is on maintenance therapy right now.  Hopefully, we can get her little bit more comfortable.  I just want to make sure her quality of life is being addressed first and foremost.  This is very important for her.  She wants to be functional so she can enjoy her family.  Again, we will have to consider some scans on her in the near future.  We will add Xgeva.  I think this is helpful and necessary.  I will see her back in another 3 weeks.   Volanda Napoleon, MD 6/30/201911:34 AM

## 2017-11-28 ENCOUNTER — Other Ambulatory Visit: Payer: Self-pay | Admitting: Internal Medicine

## 2017-12-07 ENCOUNTER — Other Ambulatory Visit: Payer: BLUE CROSS/BLUE SHIELD

## 2017-12-08 ENCOUNTER — Other Ambulatory Visit: Payer: Self-pay | Admitting: Urology

## 2017-12-08 ENCOUNTER — Ambulatory Visit: Admission: RE | Admit: 2017-12-08 | Payer: BLUE CROSS/BLUE SHIELD | Source: Ambulatory Visit

## 2017-12-08 ENCOUNTER — Encounter (HOSPITAL_COMMUNITY): Payer: Self-pay

## 2017-12-08 ENCOUNTER — Ambulatory Visit (HOSPITAL_COMMUNITY)
Admission: RE | Admit: 2017-12-08 | Discharge: 2017-12-08 | Disposition: A | Discharge status: Home / Self Care | Payer: BLUE CROSS/BLUE SHIELD | Source: Ambulatory Visit | Attending: Hematology & Oncology | Admitting: Hematology & Oncology

## 2017-12-08 ENCOUNTER — Other Ambulatory Visit: Payer: Self-pay | Admitting: *Deleted

## 2017-12-08 ENCOUNTER — Telehealth: Payer: Self-pay | Admitting: *Deleted

## 2017-12-08 ENCOUNTER — Telehealth: Payer: Self-pay | Admitting: Radiation Oncology

## 2017-12-08 DIAGNOSIS — C3492 Malignant neoplasm of unspecified part of left bronchus or lung: Secondary | ICD-10-CM

## 2017-12-08 DIAGNOSIS — C7931 Secondary malignant neoplasm of brain: Secondary | ICD-10-CM

## 2017-12-08 DIAGNOSIS — M549 Dorsalgia, unspecified: Secondary | ICD-10-CM

## 2017-12-08 MED ORDER — DIAZEPAM 5 MG PO TABS: ORAL_TABLET | ORAL | 1 refills | Status: DC

## 2017-12-08 MED FILL — diazePAM 5 MG TABS: 5 | 2 days supply | Qty: 2 | Fill #0

## 2017-12-08 NOTE — Telephone Encounter (Signed)
Patient was scheduled for PET scan today. She is unable to lay flat on her back for the needed time due to pain. She would like to have something prior to her scan when she reschedules.   Reviewed with Dr Marin Olp. He will prescribe Valium 5mg  1 hour prior to scan with the ability to repeat x 1.   Patient is aware of new prescription. Pharmacy confirmed. She is unhappy with the solution given to her. She states nothing oral will work that will allow her to lay down for 90 minutes. She states she needs something IV. Explained that the PET scan is done in an outpatient area without RN support and that IV sedation could not be offered. She was hostile with her response stating that we were wasting everyone's time by not offering her what she needed. Apologized and told her Dr Marin Olp would be sending the prescription to her preferred pharmacy.

## 2017-12-08 NOTE — Telephone Encounter (Signed)
Due to patient inability to tolerate MRI or PET scan without IV sedation, I have ordered MRI brain and Thoracic MRI with sedation at [facility], first available.  Mont Dutton, RT will call patient to coordinate these scans and we will see the patient back inthe office shortly thereafter to review findings.  Hopefully we can get some answers regarding the severe back pain that she is having.  These results will be shared with Dr. Marin Olp as soon as available.   Nicholos Johns, PA-C

## 2017-12-08 NOTE — Telephone Encounter (Signed)
Patient presented to the radiation oncology nursing clinic requesting to see Ashlyn Bruning, PA-C. Ashlyn was unavailable at the time so this RN attempted to assist the patient. Present time was 1130 and patient was scheduled to be in nuclear med for PET scan at 1130. Patient requesting "something be given to her so that she can lay flat for the MRI scan at 3:30 pm." Patient anxious about being late for her PET scan.  Patient and RN agreed she would return after her PET scan to radiation oncology thus allowing RN time to speak with Freeman Caldron, PA-C about her request.  Around noon this RN received a voicemail message from the patient that she cancelled herPET scan, MRI scan and wants to cancel her follow up until "someone gives her something so she could lay flat for longer than five minutes."  Phoned patient back at 1233. Explained this RN received her voicemail message to cancel her MRI and follow up. Explained Mont Dutton, RT is willing to reschedule her MRI and follow up once she is ready. Questioned if the patient felt the Valium prescribed by Dr. Marin Olp would help her tolerate being scanned. Patient states, "its not anxiety preventing me from getting the scans it pain." Questioned if the patient could take her oxycodone 10 mg thirty minutes prior to each scan for pain relief. Patient states, "that won't help." Questioned if patient was under contract with a pain clinic and she denied that she is. Patient understands this RN will route this information to all providers involved then, phone her back with further directions.

## 2017-12-12 ENCOUNTER — Ambulatory Visit
Admission: RE | Admit: 2017-12-12 | Discharge: 2017-12-12 | Disposition: A | Discharge status: Home / Self Care | Payer: BLUE CROSS/BLUE SHIELD | Source: Ambulatory Visit | Attending: Urology | Admitting: Urology

## 2017-12-13 ENCOUNTER — Other Ambulatory Visit: Payer: Self-pay

## 2017-12-13 DIAGNOSIS — C3492 Malignant neoplasm of unspecified part of left bronchus or lung: Secondary | ICD-10-CM

## 2017-12-14 ENCOUNTER — Inpatient Hospital Stay: Payer: BLUE CROSS/BLUE SHIELD

## 2017-12-14 ENCOUNTER — Encounter: Payer: Self-pay | Admitting: Hematology & Oncology

## 2017-12-14 ENCOUNTER — Other Ambulatory Visit: Payer: BLUE CROSS/BLUE SHIELD

## 2017-12-14 ENCOUNTER — Other Ambulatory Visit: Payer: Self-pay

## 2017-12-14 ENCOUNTER — Inpatient Hospital Stay: Payer: BLUE CROSS/BLUE SHIELD | Attending: Internal Medicine | Admitting: Hematology & Oncology

## 2017-12-14 ENCOUNTER — Ambulatory Visit: Payer: BLUE CROSS/BLUE SHIELD

## 2017-12-14 ENCOUNTER — Ambulatory Visit: Payer: BLUE CROSS/BLUE SHIELD | Admitting: Oncology

## 2017-12-14 VITALS — BP 119/81 | HR 96 | Temp 98.2°F | Resp 17 | Wt 170.0 lb

## 2017-12-14 DIAGNOSIS — C3412 Malignant neoplasm of upper lobe, left bronchus or lung: Secondary | ICD-10-CM | POA: Diagnosis present

## 2017-12-14 DIAGNOSIS — Z79899 Other long term (current) drug therapy: Secondary | ICD-10-CM

## 2017-12-14 DIAGNOSIS — C787 Secondary malignant neoplasm of liver and intrahepatic bile duct: Secondary | ICD-10-CM

## 2017-12-14 DIAGNOSIS — M542 Cervicalgia: Secondary | ICD-10-CM | POA: Diagnosis not present

## 2017-12-14 DIAGNOSIS — Z5111 Encounter for antineoplastic chemotherapy: Secondary | ICD-10-CM | POA: Diagnosis not present

## 2017-12-14 DIAGNOSIS — R0789 Other chest pain: Secondary | ICD-10-CM | POA: Diagnosis not present

## 2017-12-14 DIAGNOSIS — M549 Dorsalgia, unspecified: Secondary | ICD-10-CM | POA: Insufficient documentation

## 2017-12-14 DIAGNOSIS — C3492 Malignant neoplasm of unspecified part of left bronchus or lung: Secondary | ICD-10-CM

## 2017-12-14 DIAGNOSIS — R109 Unspecified abdominal pain: Secondary | ICD-10-CM | POA: Insufficient documentation

## 2017-12-14 DIAGNOSIS — Z5112 Encounter for antineoplastic immunotherapy: Secondary | ICD-10-CM | POA: Insufficient documentation

## 2017-12-14 LAB — CBC WITH DIFFERENTIAL (CANCER CENTER ONLY)
Basophils Absolute: 0 10*3/uL (ref 0.0–0.1)
Basophils Relative: 0 %
Eosinophils Absolute: 0 10*3/uL (ref 0.0–0.5)
Eosinophils Relative: 0 %
HCT: 42.5 % (ref 34.8–46.6)
Hemoglobin: 14.3 g/dL (ref 11.6–15.9)
LYMPHS ABS: 1.2 10*3/uL (ref 0.9–3.3)
LYMPHS PCT: 22 %
MCH: 31.8 pg (ref 26.0–34.0)
MCHC: 33.6 g/dL (ref 32.0–36.0)
MCV: 94.7 fL (ref 81.0–101.0)
MONOS PCT: 18 %
Monocytes Absolute: 1 10*3/uL — ABNORMAL HIGH (ref 0.1–0.9)
NEUTROS PCT: 60 %
Neutro Abs: 3.5 10*3/uL (ref 1.5–6.5)
Platelet Count: 249 10*3/uL (ref 145–400)
RBC: 4.49 MIL/uL (ref 3.70–5.32)
RDW: 12.4 % (ref 11.1–15.7)
WBC: 5.7 10*3/uL (ref 3.9–10.0)

## 2017-12-14 LAB — CMP (CANCER CENTER ONLY)
ALBUMIN: 4 g/dL (ref 3.5–5.0)
ALK PHOS: 160 U/L — AB (ref 26–84)
ALT: 42 U/L (ref 10–47)
AST: 45 U/L — AB (ref 11–38)
Anion gap: 9 (ref 5–15)
BUN: 6 mg/dL — AB (ref 7–22)
CHLORIDE: 100 mmol/L (ref 98–108)
CO2: 28 mmol/L (ref 18–33)
Calcium: 9.9 mg/dL (ref 8.0–10.3)
Creatinine: 0.6 mg/dL (ref 0.60–1.20)
Glucose, Bld: 97 mg/dL (ref 73–118)
Potassium: 3.8 mmol/L (ref 3.3–4.7)
SODIUM: 137 mmol/L (ref 128–145)
TOTAL PROTEIN: 7.5 g/dL (ref 6.4–8.1)
Total Bilirubin: 0.9 mg/dL (ref 0.2–1.6)

## 2017-12-14 LAB — TOTAL PROTEIN, URINE DIPSTICK: PROTEIN: NEGATIVE mg/dL

## 2017-12-14 MED ORDER — SODIUM CHLORIDE 0.9 % IV SOLN: Freq: Once | INTRAVENOUS | Status: AC

## 2017-12-14 MED ORDER — HEPARIN SOD (PORK) LOCK FLUSH 100 UNIT/ML IV SOLN
500.0000 [IU] | Freq: Once | INTRAVENOUS | Status: AC | PRN
  Administered 2017-12-14: 500 [IU]
  Filled 2017-12-14: qty 5

## 2017-12-14 MED ORDER — CYANOCOBALAMIN 1000 MCG/ML IJ SOLN
1000.0000 ug | Freq: Once | INTRAMUSCULAR | Status: AC
  Administered 2017-12-14: 1000 ug via INTRAMUSCULAR

## 2017-12-14 MED ORDER — FOLIC ACID 1 MG PO TABS: 1.0000 mg | ORAL_TABLET | Freq: Every day | ORAL | 4 refills | Status: DC

## 2017-12-14 MED ORDER — PROCHLORPERAZINE MALEATE 10 MG PO TABS
10.0000 mg | ORAL_TABLET | Freq: Once | ORAL | Status: AC
  Administered 2017-12-14: 10 mg via ORAL

## 2017-12-14 MED ORDER — SODIUM CHLORIDE 0.9 % IV SOLN
Freq: Once | INTRAVENOUS | Status: AC
  Administered 2017-12-14: 11:00:00 via INTRAVENOUS

## 2017-12-14 MED ORDER — SODIUM CHLORIDE 0.9% FLUSH
10.0000 mL | INTRAVENOUS | Status: DC | PRN
  Filled 2017-12-14: qty 10

## 2017-12-14 MED ORDER — PROCHLORPERAZINE MALEATE 10 MG PO TABS
ORAL_TABLET | ORAL | Status: AC
  Filled 2017-12-14: qty 1

## 2017-12-14 MED ORDER — SODIUM CHLORIDE 0.9 % IV SOLN
15.0000 mg/kg | Freq: Once | INTRAVENOUS | Status: AC
  Administered 2017-12-14: 1300 mg via INTRAVENOUS
  Filled 2017-12-14: qty 48

## 2017-12-14 MED ORDER — PEMETREXED DISODIUM CHEMO INJECTION 500 MG
495.0000 mg/m2 | Freq: Once | INTRAVENOUS | Status: AC
  Administered 2017-12-14: 1000 mg via INTRAVENOUS
  Filled 2017-12-14: qty 40

## 2017-12-14 MED ORDER — LORAZEPAM 2 MG/ML IJ SOLN
INTRAMUSCULAR | Status: AC
  Filled 2017-12-14: qty 1

## 2017-12-14 MED ORDER — CYANOCOBALAMIN 1000 MCG/ML IJ SOLN
INTRAMUSCULAR | Status: AC
  Filled 2017-12-14: qty 1

## 2017-12-14 MED ORDER — LORAZEPAM 2 MG/ML IJ SOLN
1.0000 mg | Freq: Once | INTRAMUSCULAR | Status: AC
  Administered 2017-12-14: 1 mg via INTRAVENOUS

## 2017-12-14 MED FILL — FOLIC ACID 1 MG TABS: 1 | 30 days supply | Qty: 30 | Fill #0

## 2017-12-14 NOTE — Patient Instructions (Signed)
Bevacizumab injection What is this medicine? BEVACIZUMAB (be va SIZ yoo mab) is a monoclonal antibody. It is used to treat many types of cancer. This medicine may be used for other purposes; ask your health care provider or pharmacist if you have questions. COMMON BRAND NAME(S): Avastin What should I tell my health care provider before I take this medicine? They need to know if you have any of these conditions: -diabetes -heart disease -high blood pressure -history of coughing up blood -prior anthracycline chemotherapy (e.g., doxorubicin, daunorubicin, epirubicin) -recent or ongoing radiation therapy -recent or planning to have surgery -stroke -an unusual or allergic reaction to bevacizumab, hamster proteins, mouse proteins, other medicines, foods, dyes, or preservatives -pregnant or trying to get pregnant -breast-feeding How should I use this medicine? This medicine is for infusion into a vein. It is given by a health care professional in a hospital or clinic setting. Talk to your pediatrician regarding the use of this medicine in children. Special care may be needed. Overdosage: If you think you have taken too much of this medicine contact a poison control center or emergency room at once. NOTE: This medicine is only for you. Do not share this medicine with others. What if I miss a dose? It is important not to miss your dose. Call your doctor or health care professional if you are unable to keep an appointment. What may interact with this medicine? Interactions are not expected. This list may not describe all possible interactions. Give your health care provider a list of all the medicines, herbs, non-prescription drugs, or dietary supplements you use. Also tell them if you smoke, drink alcohol, or use illegal drugs. Some items may interact with your medicine. What should I watch for while using this medicine? Your condition will be monitored carefully while you are receiving this  medicine. You will need important blood work and urine testing done while you are taking this medicine. This medicine may increase your risk to bruise or bleed. Call your doctor or health care professional if you notice any unusual bleeding. This medicine should be started at least 28 days following major surgery and the site of the surgery should be totally healed. Check with your doctor before scheduling dental work or surgery while you are receiving this treatment. Talk to your doctor if you have recently had surgery or if you have a wound that has not healed. Do not become pregnant while taking this medicine or for 6 months after stopping it. Women should inform their doctor if they wish to become pregnant or think they might be pregnant. There is a potential for serious side effects to an unborn child. Talk to your health care professional or pharmacist for more information. Do not breast-feed an infant while taking this medicine and for 6 months after the last dose. This medicine has caused ovarian failure in some women. This medicine may interfere with the ability to have a child. You should talk to your doctor or health care professional if you are concerned about your fertility. What side effects may I notice from receiving this medicine? Side effects that you should report to your doctor or health care professional as soon as possible: -allergic reactions like skin rash, itching or hives, swelling of the face, lips, or tongue -chest pain or chest tightness -chills -coughing up blood -high fever -seizures -severe constipation -signs and symptoms of bleeding such as bloody or black, tarry stools; red or dark-brown urine; spitting up blood or brown material that looks   like coffee grounds; red spots on the skin; unusual bruising or bleeding from the eye, gums, or nose -signs and symptoms of a blood clot such as breathing problems; chest pain; severe, sudden headache; pain, swelling, warmth in  the leg -signs and symptoms of a stroke like changes in vision; confusion; trouble speaking or understanding; severe headaches; sudden numbness or weakness of the face, arm or leg; trouble walking; dizziness; loss of balance or coordination -stomach pain -sweating -swelling of legs or ankles -vomiting -weight gain Side effects that usually do not require medical attention (report to your doctor or health care professional if they continue or are bothersome): -back pain -changes in taste -decreased appetite -dry skin -nausea -tiredness This list may not describe all possible side effects. Call your doctor for medical advice about side effects. You may report side effects to FDA at 1-800-FDA-1088. Where should I keep my medicine? This drug is given in a hospital or clinic and will not be stored at home. NOTE: This sheet is a summary. It may not cover all possible information. If you have questions about this medicine, talk to your doctor, pharmacist, or health care provider.  2018 Elsevier/Gold Standard (2016-05-13 14:33:29) Pemetrexed injection What is this medicine? PEMETREXED (PEM e TREX ed) is a chemotherapy drug used to treat lung cancers like non-small cell lung cancer and mesothelioma. It may also be used to treat other cancers. This medicine may be used for other purposes; ask your health care provider or pharmacist if you have questions. COMMON BRAND NAME(S): Alimta What should I tell my health care provider before I take this medicine? They need to know if you have any of these conditions: -infection (especially a virus infection such as chickenpox, cold sores, or herpes) -kidney disease -low blood counts, like low white cell, platelet, or red cell counts -lung or breathing disease, like asthma -radiation therapy -an unusual or allergic reaction to pemetrexed, other medicines, foods, dyes, or preservative -pregnant or trying to get pregnant -breast-feeding How should I use  this medicine? This drug is given as an infusion into a vein. It is administered in a hospital or clinic by a specially trained health care professional. Talk to your pediatrician regarding the use of this medicine in children. Special care may be needed. Overdosage: If you think you have taken too much of this medicine contact a poison control center or emergency room at once. NOTE: This medicine is only for you. Do not share this medicine with others. What if I miss a dose? It is important not to miss your dose. Call your doctor or health care professional if you are unable to keep an appointment. What may interact with this medicine? This medicine may interact with the following medications: -Ibuprofen This list may not describe all possible interactions. Give your health care provider a list of all the medicines, herbs, non-prescription drugs, or dietary supplements you use. Also tell them if you smoke, drink alcohol, or use illegal drugs. Some items may interact with your medicine. What should I watch for while using this medicine? Visit your doctor for checks on your progress. This drug may make you feel generally unwell. This is not uncommon, as chemotherapy can affect healthy cells as well as cancer cells. Report any side effects. Continue your course of treatment even though you feel ill unless your doctor tells you to stop. In some cases, you may be given additional medicines to help with side effects. Follow all directions for their use. Call your  doctor or health care professional for advice if you get a fever, chills or sore throat, or other symptoms of a cold or flu. Do not treat yourself. This drug decreases your body's ability to fight infections. Try to avoid being around people who are sick. This medicine may increase your risk to bruise or bleed. Call your doctor or health care professional if you notice any unusual bleeding. Be careful brushing and flossing your teeth or using a  toothpick because you may get an infection or bleed more easily. If you have any dental work done, tell your dentist you are receiving this medicine. Avoid taking products that contain aspirin, acetaminophen, ibuprofen, naproxen, or ketoprofen unless instructed by your doctor. These medicines may hide a fever. Call your doctor or health care professional if you get diarrhea or mouth sores. Do not treat yourself. To protect your kidneys, drink water or other fluids as directed while you are taking this medicine. Do not become pregnant while taking this medicine or for 6 months after stopping it. Women should inform their doctor if they wish to become pregnant or think they might be pregnant. Men should not father a child while taking this medicine and for 3 months after stopping it. This may interfere with the ability to father a child. You should talk to your doctor or health care professional if you are concerned about your fertility. There is a potential for serious side effects to an unborn child. Talk to your health care professional or pharmacist for more information. Do not breast-feed an infant while taking this medicine or for 1 week after stopping it. What side effects may I notice from receiving this medicine? Side effects that you should report to your doctor or health care professional as soon as possible: -allergic reactions like skin rash, itching or hives, swelling of the face, lips, or tongue -breathing problems -redness, blistering, peeling or loosening of the skin, including inside the mouth -signs and symptoms of bleeding such as bloody or black, tarry stools; red or dark-brown urine; spitting up blood or brown material that looks like coffee grounds; red spots on the skin; unusual bruising or bleeding from the eye, gums, or nose -signs and symptoms of infection like fever or chills; cough; sore throat; pain or trouble passing urine -signs and symptoms of kidney injury like trouble  passing urine or change in the amount of urine -signs and symptoms of liver injury like dark yellow or brown urine; general ill feeling or flu-like symptoms; light-colored stools; loss of appetite; nausea; right upper belly pain; unusually weak or tired; yellowing of the eyes or skin Side effects that usually do not require medical attention (report to your doctor or health care professional if they continue or are bothersome): -constipation -dizziness -mouth sores -nausea, vomiting -pain, tingling, numbness in the hands or feet -unusually weak or tired This list may not describe all possible side effects. Call your doctor for medical advice about side effects. You may report side effects to FDA at 1-800-FDA-1088. Where should I keep my medicine? This drug is given in a hospital or clinic and will not be stored at home. NOTE: This sheet is a summary. It may not cover all possible information. If you have questions about this medicine, talk to your doctor, pharmacist, or health care provider.  2018 Elsevier/Gold Standard (2016-03-15 18:51:46)

## 2017-12-14 NOTE — Progress Notes (Signed)
Hematology and Oncology Follow Up Visit  Debbie Baker [id number] Aug 24, 1965 52 y.o. 12/14/2017   Principle Diagnosis:   Metastatic adenocarcinoma of the lung-no actionable mutations  Current Therapy:    Maintenance therapy with Alimta/Avastin -- s/p cycle #3  Xgeva 120 mg SQ q 3 months -- start on 01/04/2018     Interim History:  Debbie Baker is back for her follow-up.  She was not able to have any of her scans done.  She cannot lie down on the table in radiology.  She needs sedation.  She will have the MRIs under sedation on August 13.  I will see about getting the PET scan done on her in a couple weeks.  I will give her some Valium to see if that can help her.  I told her that she only had to lie down for the PET scan for about 30 minutes or so.  I know this can still be difficult.  She is eating a little bit better.  I am happy about this.  She is not having any nausea or vomiting.  She is had no fever.  She has had no bleeding.  She has had no nausea or vomiting.  There is been no rashes.  Overall, I would have said that her performance status is ECOG 1-2.    Medications:  Current Outpatient Medications:  .  acetaminophen (TYLENOL) 100 MG/ML solution, Take 10 mg/kg by mouth every 4 (four) hours as needed for fever., Disp: , Rfl:  .  albuterol (PROVENTIL HFA;VENTOLIN HFA) 108 (90 Base) MCG/ACT inhaler, Inhale 2 puffs into the lungs every 6 (six) hours as needed for wheezing or shortness of breath., Disp: 1 Inhaler, Rfl: 0 .  antiseptic oral rinse (BIOTENE) LIQD, 15 mLs by Mouth Rinse route as needed for dry mouth., Disp: 473 mL, Rfl: 3 .  benzonatate (TESSALON) 100 MG capsule, Take 1 capsule (100 mg total) by mouth every 4 (four) hours as needed for cough., Disp: 60 capsule, Rfl: 1 .  Calcium-Magnesium-Vitamin D 185-50-100 MG-MG-UNIT CAPS, Take 1 tablet by mouth every evening., Disp: , Rfl:  .  cetirizine (ZYRTEC) 10 MG tablet, Take 10 mg by mouth daily., Disp: , Rfl:  .   dexamethasone (DECADRON) 4 MG tablet, Take 1 tablet twice a day the day before, day of, and day after each cycle of chemotherapy., Disp: 30 tablet, Rfl: 4 .  diazepam (VALIUM) 5 MG tablet, Take one tablet one hour prior to scan. May repeat times one., Disp: 2 tablet, Rfl: 1 .  diphenhydrAMINE (BENADRYL) 25 mg capsule, Take 25 mg by mouth at bedtime as needed., Disp: , Rfl:  .  dronabinol (MARINOL) 5 MG capsule, Take 1 capsule (5 mg total) by mouth 2 (two) times daily before lunch and supper., Disp: 60 capsule, Rfl: 0 .  ELDERBERRY PO, Take by mouth every morning., Disp: , Rfl:  .  fentaNYL (DURAGESIC - DOSED MCG/HR) 50 MCG/HR, Place 1 patch (50 mcg total) onto the skin every 3 (three) days., Disp: 10 patch, Rfl: 0 .  folic acid (FOLVITE) 1 MG tablet, Take 1 tablet (1 mg total) by mouth daily., Disp: 30 tablet, Rfl: 4 .  guaiFENesin (MUCINEX) 600 MG 12 hr tablet, Take 600 mg by mouth at bedtime as needed., Disp: , Rfl:  .  Lactobacillus (PROBIOTIC ACIDOPHILUS) CAPS, Take by mouth., Disp: , Rfl:  .  lidocaine-prilocaine (EMLA) cream, Apply 1 application topically as needed., Disp: 30 g, Rfl: 2 .  LORazepam (ATIVAN) 0.5  MG tablet, Take 1 tablet (0.5 mg total) by mouth as needed for anxiety (take 30 min prior to MRI scans and treatments)., Disp: 30 tablet, Rfl: 0 .  magic mouthwash SOLN, Take 5 mLs by mouth 4 (four) times daily -  with meals and at bedtime. Called in 08/05/17 by on call RN, Components benadryl  525 mg, hydrocortisone 60 mg and nystatin 0.6 mg. 240 ml, Disp: 240 mL, Rfl: 0 .  Milk Thistle 1000 MG CAPS, Take 1 capsule by mouth daily., Disp: , Rfl:  .  Misc Natural Products (TURMERIC CURCUMIN) CAPS, Take 1 capsule by mouth every evening. 1500 mg capsule, Disp: , Rfl:  .  Multiple Vitamins-Minerals (WOMENS 50+ MULTI VITAMIN/MIN) TABS, Take 1 tablet by mouth daily., Disp: , Rfl:  .  OVER THE COUNTER MEDICATION, Take 250 mg by mouth daily. Jiaogulan Supplement, Disp: , Rfl:  .  oxyCODONE 10 MG  TABS, Take 1 tablet (10 mg total) by mouth every 6 (six) hours as needed for severe pain., Disp: 120 tablet, Rfl: 0 .  pantoprazole (PROTONIX) 40 MG tablet, Take 1 tablet (40 mg total) 2 (two) times daily by mouth., Disp: 180 tablet, Rfl: 3 .  pravastatin (PRAVACHOL) 20 MG tablet, Take 20 mg by mouth every evening., Disp: , Rfl: 3 .  prochlorperazine (COMPAZINE) 10 MG tablet, Take 1 tablet (10 mg total) by mouth every 6 (six) hours as needed for nausea or vomiting., Disp: 30 tablet, Rfl: 0 .  PSYLLIUM HUSK PO, Take 1 capsule by mouth 2 (two) times daily. , Disp: , Rfl:  No current facility-administered medications for this visit.   Facility-Administered Medications Ordered in Other Visits:  .  sodium chloride flush (NS) 0.9 % injection 10 mL, 10 mL, Intracatheter, PRN, Curt Bears, MD .  sodium chloride flush (NS) 0.9 % injection 10 mL, 10 mL, Intravenous, PRN, Curt Bears, MD, 10 mL at 09/21/17 1103  Allergies:  Allergies  Allergen Reactions  . Penicillins Anaphylaxis and Rash     Has patient had a PCN reaction causing immediate rash, facial/tongue/throat swelling, SOB or lightheadedness with hypotension: # # YES # # Has patient had a PCN reaction causing severe rash involving mucus membranes or skin necrosis: No Has patient had a PCN reaction that required hospitalization No Has patient had a PCN reaction occurring within the last 10 years: # # YES # #  If all of the above answers are "NO", then may proceed with Cephalosporin use.   . Chantix [Varenicline] Other (See Comments)    Nightmares/lack of sleep    Past Medical History, Surgical history, Social history, and Family History were reviewed and updated.  Review of Systems: Review of Systems  Constitutional: Negative.   HENT:  Negative.   Eyes: Negative.   Respiratory: Positive for chest tightness.   Cardiovascular: Negative.   Gastrointestinal: Positive for abdominal pain.  Genitourinary: Negative.      Musculoskeletal: Positive for back pain and neck pain.  Skin: Negative.   Neurological: Negative.   Hematological: Negative.   Psychiatric/Behavioral: Negative.     Physical Exam:  weight is 170 lb (77.1 kg). Her oral temperature is 98.2 F (36.8 C). Her blood pressure is 119/81 and her pulse is 96. Her respiration is 17 and oxygen saturation is 98%.   Wt Readings from Last 3 Encounters:  12/14/17 170 lb (77.1 kg)  11/23/17 177 lb (80.3 kg)  10/25/17 183 lb 14.4 oz (83.4 kg)    Physical Exam  Constitutional: She is  oriented to person, place, and time.  HENT:  Head: Normocephalic and atraumatic.  Mouth/Throat: Oropharynx is clear and moist.  Eyes: Pupils are equal, round, and reactive to light. EOM are normal.  Neck: Normal range of motion.  Cardiovascular: Normal rate, regular rhythm and normal heart sounds.  Pulmonary/Chest: Effort normal and breath sounds normal.  Abdominal: Soft. Bowel sounds are normal.  Musculoskeletal: Normal range of motion. She exhibits no edema, tenderness or deformity.  Lymphadenopathy:    She has no cervical adenopathy.  Neurological: She is alert and oriented to person, place, and time.  Skin: Skin is warm and dry. No rash noted. No erythema.  Psychiatric: She has a normal mood and affect. Her behavior is normal. Judgment and thought content normal.  Vitals reviewed.    Lab Results  Component Value Date   WBC 5.7 12/14/2017   HGB 14.3 12/14/2017   HCT 42.5 12/14/2017   MCV 94.7 12/14/2017   PLT 249 12/14/2017     Chemistry      Component Value Date/Time   NA 137 12/14/2017 0935   NA 140 05/22/2017 1212   K 3.8 12/14/2017 0935   K 4.2 05/22/2017 1212   CL 100 12/14/2017 0935   CO2 28 12/14/2017 0935   CO2 21 (L) 05/22/2017 1212   BUN 6 (L) 12/14/2017 0935   BUN 10.3 05/22/2017 1212   CREATININE 0.60 12/14/2017 0935   CREATININE 0.8 05/22/2017 1212      Component Value Date/Time   CALCIUM 9.9 12/14/2017 0935   CALCIUM 9.6  05/22/2017 1212   ALKPHOS 160 (H) 12/14/2017 0935   ALKPHOS 113 05/22/2017 1212   AST 45 (H) 12/14/2017 0935   AST 17 05/22/2017 1212   ALT 42 12/14/2017 0935   ALT 27 05/22/2017 1212   BILITOT 0.9 12/14/2017 0935   BILITOT 0.31 05/22/2017 1212       Impression and Plan: Debbie Baker is a 52 year old white female with metastatic adenocarcinoma of the lung.  She is on maintenance therapy right now.  She is here for her fourth cycle.  Hopefully, we can still try to get these scans done.  The sedation will help with the MRI.  There is no sedation for the PET scan.  I will hope that 10 mg of Valium will be acceptable.  She is going to try the 10 mg at home this weekend.  She will let us know if that will be adequate.  I have noted that her alkaline phosphatase has gone up a little bit.  This is also of some concern.  This might reflect issues with her bones.  It may also reflect something with her liver.  I think if we do find progressive disease, then we will need to think about another biopsy.  We will add Xgeva.  I think this is helpful and necessary.  I will see her back in another 3 weeks.   Volanda Napoleon, MD 7/18/201910:17 AM

## 2017-12-14 NOTE — Patient Instructions (Signed)
Implanted Port Home Guide An implanted port is a type of central line that is placed under the skin. Central lines are used to provide IV access when treatment or nutrition needs to be given through a person's veins. Implanted ports are used for long-term IV access. An implanted port may be placed because:  You need IV medicine that would be irritating to the small veins in your hands or arms.  You need long-term IV medicines, such as antibiotics.  You need IV nutrition for a long period.  You need frequent blood draws for lab tests.  You need dialysis.  Implanted ports are usually placed in the chest area, but they can also be placed in the upper arm, the abdomen, or the leg. An implanted port has two main parts:  Reservoir. The reservoir is round and will appear as a small, raised area under your skin. The reservoir is the part where a needle is inserted to give medicines or draw blood.  Catheter. The catheter is a thin, flexible tube that extends from the reservoir. The catheter is placed into a large vein. Medicine that is inserted into the reservoir goes into the catheter and then into the vein.  How will I care for my incision site? Do not get the incision site wet. Bathe or shower as directed by your health care provider. How is my port accessed? Special steps must be taken to access the port:  Before the port is accessed, a numbing cream can be placed on the skin. This helps numb the skin over the port site.  Your health care provider uses a sterile technique to access the port. ? Your health care provider must put on a mask and sterile gloves. ? The skin over your port is cleaned carefully with an antiseptic and allowed to dry. ? The port is gently pinched between sterile gloves, and a needle is inserted into the port.  Only "non-coring" port needles should be used to access the port. Once the port is accessed, a blood return should be checked. This helps ensure that the port  is in the vein and is not clogged.  If your port needs to remain accessed for a constant infusion, a clear (transparent) bandage will be placed over the needle site. The bandage and needle will need to be changed every week, or as directed by your health care provider.  Keep the bandage covering the needle clean and dry. Do not get it wet. Follow your health care provider's instructions on how to take a shower or bath while the port is accessed.  If your port does not need to stay accessed, no bandage is needed over the port.  What is flushing? Flushing helps keep the port from getting clogged. Follow your health care provider's instructions on how and when to flush the port. Ports are usually flushed with saline solution or a medicine called heparin. The need for flushing will depend on how the port is used.  If the port is used for intermittent medicines or blood draws, the port will need to be flushed: ? After medicines have been given. ? After blood has been drawn. ? As part of routine maintenance.  If a constant infusion is running, the port may not need to be flushed.  How long will my port stay implanted? The port can stay in for as long as your health care provider thinks it is needed. When it is time for the port to come out, surgery will be   done to remove it. The procedure is similar to the one performed when the port was put in. When should I seek immediate medical care? When you have an implanted port, you should seek immediate medical care if:  You notice a bad smell coming from the incision site.  You have swelling, redness, or drainage at the incision site.  You have more swelling or pain at the port site or the surrounding area.  You have a fever that is not controlled with medicine.  This information is not intended to replace advice given to you by your health care provider. Make sure you discuss any questions you have with your health care provider. Document  Released: 05/16/2005 Document Revised: 10/22/2015 Document Reviewed: 01/21/2013 Elsevier Interactive Patient Education  2017 [employer]  

## 2017-12-18 ENCOUNTER — Telehealth: Payer: Self-pay | Admitting: *Deleted

## 2017-12-18 NOTE — Telephone Encounter (Signed)
Received call from patient stating that she tried taking the Valium that Dr. Marin Olp prescribed for her PET scan.  It did make her sleepy but still was not able to get scan due to moving around due to pain.  Patient continued to have pain for the next day after her scan.  Calling to see what Dr. Marin Olp would want to do at this point to get her scan.  Informed patient that Dr. Marin Olp is out of the office today and Tuesday but will ask him on Wednesday.

## 2017-12-19 ENCOUNTER — Other Ambulatory Visit: Payer: Self-pay | Admitting: *Deleted

## 2017-12-19 MED ORDER — OXYCODONE HCL 10 MG PO TABS: 10.0000 mg | ORAL_TABLET | Freq: Four times a day (QID) | ORAL | 0 refills | Status: DC | PRN

## 2017-12-19 MED ORDER — FENTANYL 50 MCG/HR TD PT72: 50.0000 ug | MEDICATED_PATCH | TRANSDERMAL | 0 refills | Status: DC

## 2017-12-19 MED FILL — oxyCODONE HCL 10 MG TABS: 10 | 30 days supply | Qty: 120 | Fill #0

## 2018-01-01 ENCOUNTER — Telehealth: Payer: Self-pay

## 2018-01-01 NOTE — Telephone Encounter (Signed)
Received VM from pt stating that she continues to have "debilitating pain" and that she is unable to work or complete normal ADL's d/t the the "extreme check and rib pressure and back pain."   Per Dr Marin Olp, pt can increase Fentanyl patch to 151mcg/hr.   Message left on personalized VM to contact our office for new pain instructions. dph

## 2018-01-01 NOTE — Telephone Encounter (Signed)
New instructions for Fentanyl patch (see previous note) given to pt via phone. Pt verbalizes understanding and repeats back instructions. dph

## 2018-01-04 ENCOUNTER — Other Ambulatory Visit: Payer: BLUE CROSS/BLUE SHIELD

## 2018-01-04 ENCOUNTER — Encounter: Payer: Self-pay | Admitting: Hematology & Oncology

## 2018-01-04 ENCOUNTER — Ambulatory Visit: Payer: BLUE CROSS/BLUE SHIELD

## 2018-01-04 ENCOUNTER — Other Ambulatory Visit: Payer: Self-pay | Admitting: *Deleted

## 2018-01-04 ENCOUNTER — Inpatient Hospital Stay: Payer: BLUE CROSS/BLUE SHIELD | Attending: Internal Medicine

## 2018-01-04 ENCOUNTER — Other Ambulatory Visit: Payer: Self-pay

## 2018-01-04 ENCOUNTER — Ambulatory Visit: Payer: BLUE CROSS/BLUE SHIELD | Admitting: Internal Medicine

## 2018-01-04 ENCOUNTER — Inpatient Hospital Stay: Payer: BLUE CROSS/BLUE SHIELD

## 2018-01-04 ENCOUNTER — Inpatient Hospital Stay (HOSPITAL_BASED_OUTPATIENT_CLINIC_OR_DEPARTMENT_OTHER): Payer: BLUE CROSS/BLUE SHIELD | Admitting: Hematology & Oncology

## 2018-01-04 VITALS — BP 116/84 | HR 97 | Temp 98.7°F | Resp 18 | Wt 164.0 lb

## 2018-01-04 DIAGNOSIS — J90 Pleural effusion, not elsewhere classified: Secondary | ICD-10-CM | POA: Insufficient documentation

## 2018-01-04 DIAGNOSIS — R0789 Other chest pain: Secondary | ICD-10-CM | POA: Diagnosis not present

## 2018-01-04 DIAGNOSIS — M549 Dorsalgia, unspecified: Secondary | ICD-10-CM | POA: Insufficient documentation

## 2018-01-04 DIAGNOSIS — M542 Cervicalgia: Secondary | ICD-10-CM

## 2018-01-04 DIAGNOSIS — C7931 Secondary malignant neoplasm of brain: Secondary | ICD-10-CM | POA: Insufficient documentation

## 2018-01-04 DIAGNOSIS — Z79899 Other long term (current) drug therapy: Secondary | ICD-10-CM | POA: Insufficient documentation

## 2018-01-04 DIAGNOSIS — C3412 Malignant neoplasm of upper lobe, left bronchus or lung: Secondary | ICD-10-CM

## 2018-01-04 DIAGNOSIS — R109 Unspecified abdominal pain: Secondary | ICD-10-CM

## 2018-01-04 DIAGNOSIS — K5900 Constipation, unspecified: Secondary | ICD-10-CM | POA: Diagnosis not present

## 2018-01-04 DIAGNOSIS — Z5112 Encounter for antineoplastic immunotherapy: Secondary | ICD-10-CM | POA: Diagnosis not present

## 2018-01-04 DIAGNOSIS — C7951 Secondary malignant neoplasm of bone: Secondary | ICD-10-CM | POA: Insufficient documentation

## 2018-01-04 DIAGNOSIS — Z95828 Presence of other vascular implants and grafts: Secondary | ICD-10-CM

## 2018-01-04 DIAGNOSIS — C3492 Malignant neoplasm of unspecified part of left bronchus or lung: Secondary | ICD-10-CM

## 2018-01-04 DIAGNOSIS — C787 Secondary malignant neoplasm of liver and intrahepatic bile duct: Secondary | ICD-10-CM

## 2018-01-04 DIAGNOSIS — Z5111 Encounter for antineoplastic chemotherapy: Secondary | ICD-10-CM | POA: Diagnosis not present

## 2018-01-04 LAB — CMP (CANCER CENTER ONLY)
ALBUMIN: 3.2 g/dL — AB (ref 3.5–5.0)
ALK PHOS: 217 U/L — AB (ref 26–84)
ALT: 46 U/L (ref 10–47)
AST: 61 U/L — ABNORMAL HIGH (ref 11–38)
Anion gap: 6 (ref 5–15)
BUN: 9 mg/dL (ref 7–22)
CHLORIDE: 102 mmol/L (ref 98–108)
CO2: 28 mmol/L (ref 18–33)
CREATININE: 0.8 mg/dL (ref 0.60–1.20)
Calcium: 9.7 mg/dL (ref 8.0–10.3)
GLUCOSE: 119 mg/dL — AB (ref 73–118)
Potassium: 3.4 mmol/L (ref 3.3–4.7)
SODIUM: 136 mmol/L (ref 128–145)
Total Bilirubin: 0.7 mg/dL (ref 0.2–1.6)
Total Protein: 6.7 g/dL (ref 6.4–8.1)

## 2018-01-04 LAB — CBC WITH DIFFERENTIAL (CANCER CENTER ONLY)
BASOS PCT: 1 %
Basophils Absolute: 0 10*3/uL (ref 0.0–0.1)
EOS PCT: 2 %
Eosinophils Absolute: 0.1 10*3/uL (ref 0.0–0.5)
HCT: 40 % (ref 34.8–46.6)
HEMOGLOBIN: 12.9 g/dL (ref 11.6–15.9)
Lymphocytes Relative: 16 %
Lymphs Abs: 0.6 10*3/uL — ABNORMAL LOW (ref 0.9–3.3)
MCH: 30.8 pg (ref 26.0–34.0)
MCHC: 32.3 g/dL (ref 32.0–36.0)
MCV: 95.5 fL (ref 81.0–101.0)
MONOS PCT: 17 %
Monocytes Absolute: 0.6 10*3/uL (ref 0.1–0.9)
NEUTROS PCT: 64 %
Neutro Abs: 2.4 10*3/uL (ref 1.5–6.5)
PLATELETS: 214 10*3/uL (ref 145–400)
RBC: 4.19 MIL/uL (ref 3.70–5.32)
RDW: 13.3 % (ref 11.1–15.7)
WBC: 3.8 10*3/uL — AB (ref 3.9–10.0)

## 2018-01-04 LAB — LACTATE DEHYDROGENASE: LDH: 234 U/L — AB (ref 98–192)

## 2018-01-04 MED ORDER — SODIUM CHLORIDE 0.9 % IV SOLN
Freq: Once | INTRAVENOUS | Status: AC
  Administered 2018-01-04: 12:00:00 via INTRAVENOUS
  Filled 2018-01-04: qty 250

## 2018-01-04 MED ORDER — DENOSUMAB 120 MG/1.7ML ~~LOC~~ SOLN
SUBCUTANEOUS | Status: AC
  Filled 2018-01-04: qty 1.7

## 2018-01-04 MED ORDER — OXYCODONE HCL 20 MG PO TABS: 20.0000 mg | ORAL_TABLET | Freq: Four times a day (QID) | ORAL | 0 refills | Status: DC | PRN

## 2018-01-04 MED ORDER — FENTANYL 100 MCG/HR TD PT72: 100.0000 ug | MEDICATED_PATCH | TRANSDERMAL | 0 refills | Status: DC

## 2018-01-04 MED ORDER — SODIUM CHLORIDE 0.9 % IV SOLN
490.0000 mg/m2 | Freq: Once | INTRAVENOUS | Status: AC
  Administered 2018-01-04: 1000 mg via INTRAVENOUS
  Filled 2018-01-04: qty 40

## 2018-01-04 MED ORDER — DENOSUMAB 120 MG/1.7ML ~~LOC~~ SOLN
120.0000 mg | Freq: Once | SUBCUTANEOUS | Status: AC
  Administered 2018-01-04: 120 mg via SUBCUTANEOUS

## 2018-01-04 MED ORDER — SODIUM CHLORIDE 0.9% FLUSH
10.0000 mL | INTRAVENOUS | Status: DC | PRN
  Administered 2018-01-04: 10 mL
  Filled 2018-01-04: qty 10

## 2018-01-04 MED ORDER — PROCHLORPERAZINE MALEATE 10 MG PO TABS
ORAL_TABLET | ORAL | Status: AC
  Filled 2018-01-04: qty 1

## 2018-01-04 MED ORDER — PROCHLORPERAZINE MALEATE 10 MG PO TABS
10.0000 mg | ORAL_TABLET | Freq: Once | ORAL | Status: AC
Start: 2018-01-04 — End: 2018-01-04
  Administered 2018-01-04: 10 mg via ORAL

## 2018-01-04 MED ORDER — SODIUM CHLORIDE 0.9 % IV SOLN
1200.0000 mg | Freq: Once | INTRAVENOUS | Status: AC
  Administered 2018-01-04: 1200 mg via INTRAVENOUS
  Filled 2018-01-04: qty 48

## 2018-01-04 MED ORDER — HEPARIN SOD (PORK) LOCK FLUSH 100 UNIT/ML IV SOLN
500.0000 [IU] | Freq: Once | INTRAVENOUS | Status: AC | PRN
  Administered 2018-01-04: 500 [IU]
  Filled 2018-01-04: qty 5

## 2018-01-04 MED FILL — oxyCODONE HCL 20 MG TABS: 20 | 30 days supply | Qty: 120 | Fill #0

## 2018-01-04 NOTE — Patient Instructions (Signed)
Implanted Port Home Guide An implanted port is a type of central line that is placed under the skin. Central lines are used to provide IV access when treatment or nutrition needs to be given through a person's veins. Implanted ports are used for long-term IV access. An implanted port may be placed because:  You need IV medicine that would be irritating to the small veins in your hands or arms.  You need long-term IV medicines, such as antibiotics.  You need IV nutrition for a long period.  You need frequent blood draws for lab tests.  You need dialysis.  Implanted ports are usually placed in the chest area, but they can also be placed in the upper arm, the abdomen, or the leg. An implanted port has two main parts:  Reservoir. The reservoir is round and will appear as a small, raised area under your skin. The reservoir is the part where a needle is inserted to give medicines or draw blood.  Catheter. The catheter is a thin, flexible tube that extends from the reservoir. The catheter is placed into a large vein. Medicine that is inserted into the reservoir goes into the catheter and then into the vein.  How will I care for my incision site? Do not get the incision site wet. Bathe or shower as directed by your health care provider. How is my port accessed? Special steps must be taken to access the port:  Before the port is accessed, a numbing cream can be placed on the skin. This helps numb the skin over the port site.  Your health care provider uses a sterile technique to access the port. ? Your health care provider must put on a mask and sterile gloves. ? The skin over your port is cleaned carefully with an antiseptic and allowed to dry. ? The port is gently pinched between sterile gloves, and a needle is inserted into the port.  Only "non-coring" port needles should be used to access the port. Once the port is accessed, a blood return should be checked. This helps ensure that the port  is in the vein and is not clogged.  If your port needs to remain accessed for a constant infusion, a clear (transparent) bandage will be placed over the needle site. The bandage and needle will need to be changed every week, or as directed by your health care provider.  Keep the bandage covering the needle clean and dry. Do not get it wet. Follow your health care provider's instructions on how to take a shower or bath while the port is accessed.  If your port does not need to stay accessed, no bandage is needed over the port.  What is flushing? Flushing helps keep the port from getting clogged. Follow your health care provider's instructions on how and when to flush the port. Ports are usually flushed with saline solution or a medicine called heparin. The need for flushing will depend on how the port is used.  If the port is used for intermittent medicines or blood draws, the port will need to be flushed: ? After medicines have been given. ? After blood has been drawn. ? As part of routine maintenance.  If a constant infusion is running, the port may not need to be flushed.  How long will my port stay implanted? The port can stay in for as long as your health care provider thinks it is needed. When it is time for the port to come out, surgery will be   done to remove it. The procedure is similar to the one performed when the port was put in. When should I seek immediate medical care? When you have an implanted port, you should seek immediate medical care if:  You notice a bad smell coming from the incision site.  You have swelling, redness, or drainage at the incision site.  You have more swelling or pain at the port site or the surrounding area.  You have a fever that is not controlled with medicine.  This information is not intended to replace advice given to you by your health care provider. Make sure you discuss any questions you have with your health care provider. Document  Released: 05/16/2005 Document Revised: 10/22/2015 Document Reviewed: 01/21/2013 Elsevier Interactive Patient Education  2017 [employer]  

## 2018-01-04 NOTE — Progress Notes (Signed)
Hematology and Oncology Follow Up Visit  Debbie Baker [id number] 1965/09/27 52 y.o. 01/04/2018   Principle Diagnosis:   Metastatic adenocarcinoma of the lung-no actionable mutations  Current Therapy:    Maintenance therapy with Alimta/Avastin -- s/p cycle #4  Xgeva 120 mg SQ q 3 months -- start on 01/04/2018     Interim History:  Debbie Baker is back for her follow-up.  She comes in with her husband.  Unfortunately, she could not have the PET scan done.  She tried real hard to lie down flat for this.  Unfortunately, radiology will not sedate her for a PET scan.  She is going for a MRI next week.  This is an MRI of the spine.  She still has a lot of pain in the mid back.  This is why she cannot lie down.  She is on Duragesic patch at 100 mcg every 3 days.  I told her to increase the Duragesic patch to 100 mcg every 2 days.  We will also change her oxycodone from 10 mg to 20 mg every 6 hours.  I have noted that her alkaline phosphatase keeps going up on her labs.  This is quite troublesome for the possibility of bone involvement.  She has not had any leg swelling.  She has had no problems with bowels or bladder.  She uses MiraLAX when she is constipated.  Her appetite is doing all right.  She has had no nausea or vomiting.  There is been no bleeding.  She has had no fever.  Overall, her performance status is ECOG 1.  Medications:  Current Outpatient Medications:  .  fentaNYL (DURAGESIC - DOSED MCG/HR) 100 MCG/HR, Place 100 mcg onto the skin every 3 (three) days., Disp: , Rfl:  .  albuterol (PROVENTIL HFA;VENTOLIN HFA) 108 (90 Base) MCG/ACT inhaler, Inhale 2 puffs into the lungs every 6 (six) hours as needed for wheezing or shortness of breath. (Patient not taking: Reported on 01/01/2018), Disp: 1 Inhaler, Rfl: 0 .  antiseptic oral rinse (BIOTENE) LIQD, 15 mLs by Mouth Rinse route as needed for dry mouth., Disp: 473 mL, Rfl: 3 .  benzonatate (TESSALON) 100 MG capsule, Take 1 capsule (100  mg total) by mouth every 4 (four) hours as needed for cough., Disp: 60 capsule, Rfl: 1 .  Black Elderberry (SAMBUCUS ELDERBERRY PO), Take 50 mg by mouth daily., Disp: , Rfl:  .  Calcium-Magnesium-Vitamin D 185-50-100 MG-MG-UNIT CAPS, Take 1 tablet by mouth every evening., Disp: , Rfl:  .  cetirizine (ZYRTEC) 10 MG tablet, Take 10 mg by mouth daily., Disp: , Rfl:  .  dexamethasone (DECADRON) 4 MG tablet, Take 1 tablet twice a day the day before, day of, and day after each cycle of chemotherapy., Disp: 30 tablet, Rfl: 4 .  diphenhydrAMINE (BENADRYL) 25 mg capsule, Take 25 mg by mouth at bedtime as needed., Disp: , Rfl:  .  dronabinol (MARINOL) 5 MG capsule, Take 1 capsule (5 mg total) by mouth 2 (two) times daily before lunch and supper., Disp: 60 capsule, Rfl: 0 .  folic acid (FOLVITE) 1 MG tablet, Take 1 tablet (1 mg total) by mouth daily., Disp: 30 tablet, Rfl: 4 .  Lactobacillus (PROBIOTIC ACIDOPHILUS) CAPS, Take 1 capsule by mouth daily. , Disp: , Rfl:  .  lidocaine-prilocaine (EMLA) cream, Apply 1 application topically as needed., Disp: 30 g, Rfl: 2 .  LORazepam (ATIVAN) 0.5 MG tablet, Take 1 tablet (0.5 mg total) by mouth as needed for anxiety (take  30 min prior to MRI scans and treatments)., Disp: 30 tablet, Rfl: 0 .  magic mouthwash SOLN, Take 5 mLs by mouth 4 (four) times daily -  with meals and at bedtime. Called in 08/05/17 by on call RN, Components benadryl  525 mg, hydrocortisone 60 mg and nystatin 0.6 mg. 240 ml, Disp: 240 mL, Rfl: 0 .  magnesium citrate SOLN, Take 1 Bottle by mouth daily as needed for moderate constipation or severe constipation., Disp: , Rfl:  .  Milk Thistle 1000 MG CAPS, Take 1 capsule by mouth daily., Disp: , Rfl:  .  Multiple Vitamins-Minerals (WOMENS 50+ MULTI VITAMIN/MIN) TABS, Take 1 tablet by mouth daily., Disp: , Rfl:  .  OVER THE COUNTER MEDICATION, Take 800 mg by mouth daily. Jiaogulan Supplement, Disp: , Rfl:  .  Oxycodone HCl 10 MG TABS, Take 1 tablet (10 mg  total) by mouth every 6 (six) hours as needed., Disp: 120 tablet, Rfl: 0 .  pantoprazole (PROTONIX) 40 MG tablet, Take 1 tablet (40 mg total) 2 (two) times daily by mouth., Disp: 180 tablet, Rfl: 3 .  pravastatin (PRAVACHOL) 20 MG tablet, Take 20 mg by mouth daily. , Disp: , Rfl: 3 .  prochlorperazine (COMPAZINE) 10 MG tablet, Take 1 tablet (10 mg total) by mouth every 6 (six) hours as needed for nausea or vomiting. (Patient not taking: Reported on 01/01/2018), Disp: 30 tablet, Rfl: 0 .  PSYLLIUM HUSK PO, Take 500 mg by mouth 2 (two) times daily. , Disp: , Rfl:  .  TURMERIC CURCUMIN PO, Take 2,050 mg by mouth 2 (two) times daily., Disp: , Rfl:  No current facility-administered medications for this visit.   Facility-Administered Medications Ordered in Other Visits:  .  sodium chloride flush (NS) 0.9 % injection 10 mL, 10 mL, Intracatheter, PRN, Curt Bears, MD .  sodium chloride flush (NS) 0.9 % injection 10 mL, 10 mL, Intravenous, PRN, Curt Bears, MD, 10 mL at 09/21/17 1103  Allergies:  Allergies  Allergen Reactions  . Penicillins Anaphylaxis and Rash     Has patient had a PCN reaction causing immediate rash, facial/tongue/throat swelling, SOB or lightheadedness with hypotension: # # YES # # Has patient had a PCN reaction causing severe rash involving mucus membranes or skin necrosis: No Has patient had a PCN reaction that required hospitalization No Has patient had a PCN reaction occurring within the last 10 years: # # YES # #  If all of the above answers are "NO", then may proceed with Cephalosporin use.   . Chantix [Varenicline] Other (See Comments)    Nightmares/lack of sleep    Past Medical History, Surgical history, Social history, and Family History were reviewed and updated.  Review of Systems: Review of Systems  Constitutional: Negative.   HENT:  Negative.   Eyes: Negative.   Respiratory: Positive for chest tightness.   Cardiovascular: Negative.     Gastrointestinal: Positive for abdominal pain.  Genitourinary: Negative.    Musculoskeletal: Positive for back pain and neck pain.  Skin: Negative.   Neurological: Negative.   Hematological: Negative.   Psychiatric/Behavioral: Negative.     Physical Exam:  weight is 164 lb (74.4 kg). Her oral temperature is 98.7 F (37.1 C). Her blood pressure is 116/84 and her pulse is 97. Her respiration is 18 and oxygen saturation is 96%.   Wt Readings from Last 3 Encounters:  01/04/18 164 lb (74.4 kg)  12/14/17 170 lb (77.1 kg)  11/23/17 177 lb (80.3 kg)  Physical Exam  Constitutional: She is oriented to person, place, and time.  HENT:  Head: Normocephalic and atraumatic.  Mouth/Throat: Oropharynx is clear and moist.  Eyes: Pupils are equal, round, and reactive to light. EOM are normal.  Neck: Normal range of motion.  Cardiovascular: Normal rate, regular rhythm and normal heart sounds.  Pulmonary/Chest: Effort normal and breath sounds normal.  Abdominal: Soft. Bowel sounds are normal.  Musculoskeletal: Normal range of motion. She exhibits no edema, tenderness or deformity.  Lymphadenopathy:    She has no cervical adenopathy.  Neurological: She is alert and oriented to person, place, and time.  Skin: Skin is warm and dry. No rash noted. No erythema.  Psychiatric: She has a normal mood and affect. Her behavior is normal. Judgment and thought content normal.  Vitals reviewed.    Lab Results  Component Value Date   WBC 3.8 (L) 01/04/2018   HGB 12.9 01/04/2018   HCT 40.0 01/04/2018   MCV 95.5 01/04/2018   PLT 214 01/04/2018     Chemistry      Component Value Date/Time   NA 137 12/14/2017 0935   NA 140 05/22/2017 1212   K 3.8 12/14/2017 0935   K 4.2 05/22/2017 1212   CL 100 12/14/2017 0935   CO2 28 12/14/2017 0935   CO2 21 (L) 05/22/2017 1212   BUN 6 (L) 12/14/2017 0935   BUN 10.3 05/22/2017 1212   CREATININE 0.60 12/14/2017 0935   CREATININE 0.8 05/22/2017 1212       Component Value Date/Time   CALCIUM 9.9 12/14/2017 0935   CALCIUM 9.6 05/22/2017 1212   ALKPHOS 160 (H) 12/14/2017 0935   ALKPHOS 113 05/22/2017 1212   AST 45 (H) 12/14/2017 0935   AST 17 05/22/2017 1212   ALT 42 12/14/2017 0935   ALT 27 05/22/2017 1212   BILITOT 0.9 12/14/2017 0935   BILITOT 0.31 05/22/2017 1212       Impression and Plan: Ms. Gargus is a 52 year old white female with metastatic adenocarcinoma of the lung.  She is on maintenance therapy right now.  She is here for her fifth cycle.  We will have to see what the MRI shows.  If she does have progressive disease, we somehow need to get a biopsy.  If she has progressive disease, we may have to think about radiation therapy.  We are always can use chemotherapy.  She is on maintenance therapy right now.  I am not sure she has had chemo immunotherapy.  I spent about 30 minutes with she and her husband.  All the time was spent face-to-face.  I counseled them and coordinated care for them.  I have reviewed her labs.  I answered their questions.  I feel bad for her.  I know that she is trying hard.  Hopefully, the [facility] might help a little bit.  Volanda Napoleon, MD 8/8/201911:03 AM

## 2018-01-04 NOTE — Patient Instructions (Signed)
Bevacizumab injection What is this medicine? BEVACIZUMAB (be va SIZ yoo mab) is a monoclonal antibody. It is used to treat many types of cancer. This medicine may be used for other purposes; ask your health care provider or pharmacist if you have questions. COMMON BRAND NAME(S): Avastin What should I tell my health care provider before I take this medicine? They need to know if you have any of these conditions: -diabetes -heart disease -high blood pressure -history of coughing up blood -prior anthracycline chemotherapy (e.g., doxorubicin, daunorubicin, epirubicin) -recent or ongoing radiation therapy -recent or planning to have surgery -stroke -an unusual or allergic reaction to bevacizumab, hamster proteins, mouse proteins, other medicines, foods, dyes, or preservatives -pregnant or trying to get pregnant -breast-feeding How should I use this medicine? This medicine is for infusion into a vein. It is given by a health care professional in a hospital or clinic setting. Talk to your pediatrician regarding the use of this medicine in children. Special care may be needed. Overdosage: If you think you have taken too much of this medicine contact a poison control center or emergency room at once. NOTE: This medicine is only for you. Do not share this medicine with others. What if I miss a dose? It is important not to miss your dose. Call your doctor or health care professional if you are unable to keep an appointment. What may interact with this medicine? Interactions are not expected. This list may not describe all possible interactions. Give your health care provider a list of all the medicines, herbs, non-prescription drugs, or dietary supplements you use. Also tell them if you smoke, drink alcohol, or use illegal drugs. Some items may interact with your medicine. What should I watch for while using this medicine? Your condition will be monitored carefully while you are receiving this  medicine. You will need important blood work and urine testing done while you are taking this medicine. This medicine may increase your risk to bruise or bleed. Call your doctor or health care professional if you notice any unusual bleeding. This medicine should be started at least 28 days following major surgery and the site of the surgery should be totally healed. Check with your doctor before scheduling dental work or surgery while you are receiving this treatment. Talk to your doctor if you have recently had surgery or if you have a wound that has not healed. Do not become pregnant while taking this medicine or for 6 months after stopping it. Women should inform their doctor if they wish to become pregnant or think they might be pregnant. There is a potential for serious side effects to an unborn child. Talk to your health care professional or pharmacist for more information. Do not breast-feed an infant while taking this medicine and for 6 months after the last dose. This medicine has caused ovarian failure in some women. This medicine may interfere with the ability to have a child. You should talk to your doctor or health care professional if you are concerned about your fertility. What side effects may I notice from receiving this medicine? Side effects that you should report to your doctor or health care professional as soon as possible: -allergic reactions like skin rash, itching or hives, swelling of the face, lips, or tongue -chest pain or chest tightness -chills -coughing up blood -high fever -seizures -severe constipation -signs and symptoms of bleeding such as bloody or black, tarry stools; red or dark-brown urine; spitting up blood or brown material that looks  like coffee grounds; red spots on the skin; unusual bruising or bleeding from the eye, gums, or nose -signs and symptoms of a blood clot such as breathing problems; chest pain; severe, sudden headache; pain, swelling, warmth in  the leg -signs and symptoms of a stroke like changes in vision; confusion; trouble speaking or understanding; severe headaches; sudden numbness or weakness of the face, arm or leg; trouble walking; dizziness; loss of balance or coordination -stomach pain -sweating -swelling of legs or ankles -vomiting -weight gain Side effects that usually do not require medical attention (report to your doctor or health care professional if they continue or are bothersome): -back pain -changes in taste -decreased appetite -dry skin -nausea -tiredness This list may not describe all possible side effects. Call your doctor for medical advice about side effects. You may report side effects to FDA at 1-800-FDA-1088. Where should I keep my medicine? This drug is given in a hospital or clinic and will not be stored at home. NOTE: This sheet is a summary. It may not cover all possible information. If you have questions about this medicine, talk to your doctor, pharmacist, or health care provider.  2018 Elsevier/Gold Standard (2016-05-13 14:33:29) Pemetrexed injection What is this medicine? PEMETREXED (PEM e TREX ed) is a chemotherapy drug used to treat lung cancers like non-small cell lung cancer and mesothelioma. It may also be used to treat other cancers. This medicine may be used for other purposes; ask your health care provider or pharmacist if you have questions. COMMON BRAND NAME(S): Alimta What should I tell my health care provider before I take this medicine? They need to know if you have any of these conditions: -infection (especially a virus infection such as chickenpox, cold sores, or herpes) -kidney disease -low blood counts, like low white cell, platelet, or red cell counts -lung or breathing disease, like asthma -radiation therapy -an unusual or allergic reaction to pemetrexed, other medicines, foods, dyes, or preservative -pregnant or trying to get pregnant -breast-feeding How should I use  this medicine? This drug is given as an infusion into a vein. It is administered in a hospital or clinic by a specially trained health care professional. Talk to your pediatrician regarding the use of this medicine in children. Special care may be needed. Overdosage: If you think you have taken too much of this medicine contact a poison control center or emergency room at once. NOTE: This medicine is only for you. Do not share this medicine with others. What if I miss a dose? It is important not to miss your dose. Call your doctor or health care professional if you are unable to keep an appointment. What may interact with this medicine? This medicine may interact with the following medications: -Ibuprofen This list may not describe all possible interactions. Give your health care provider a list of all the medicines, herbs, non-prescription drugs, or dietary supplements you use. Also tell them if you smoke, drink alcohol, or use illegal drugs. Some items may interact with your medicine. What should I watch for while using this medicine? Visit your doctor for checks on your progress. This drug may make you feel generally unwell. This is not uncommon, as chemotherapy can affect healthy cells as well as cancer cells. Report any side effects. Continue your course of treatment even though you feel ill unless your doctor tells you to stop. In some cases, you may be given additional medicines to help with side effects. Follow all directions for their use. Call your  doctor or health care professional for advice if you get a fever, chills or sore throat, or other symptoms of a cold or flu. Do not treat yourself. This drug decreases your body's ability to fight infections. Try to avoid being around people who are sick. This medicine may increase your risk to bruise or bleed. Call your doctor or health care professional if you notice any unusual bleeding. Be careful brushing and flossing your teeth or using a  toothpick because you may get an infection or bleed more easily. If you have any dental work done, tell your dentist you are receiving this medicine. Avoid taking products that contain aspirin, acetaminophen, ibuprofen, naproxen, or ketoprofen unless instructed by your doctor. These medicines may hide a fever. Call your doctor or health care professional if you get diarrhea or mouth sores. Do not treat yourself. To protect your kidneys, drink water or other fluids as directed while you are taking this medicine. Do not become pregnant while taking this medicine or for 6 months after stopping it. Women should inform their doctor if they wish to become pregnant or think they might be pregnant. Men should not father a child while taking this medicine and for 3 months after stopping it. This may interfere with the ability to father a child. You should talk to your doctor or health care professional if you are concerned about your fertility. There is a potential for serious side effects to an unborn child. Talk to your health care professional or pharmacist for more information. Do not breast-feed an infant while taking this medicine or for 1 week after stopping it. What side effects may I notice from receiving this medicine? Side effects that you should report to your doctor or health care professional as soon as possible: -allergic reactions like skin rash, itching or hives, swelling of the face, lips, or tongue -breathing problems -redness, blistering, peeling or loosening of the skin, including inside the mouth -signs and symptoms of bleeding such as bloody or black, tarry stools; red or dark-brown urine; spitting up blood or brown material that looks like coffee grounds; red spots on the skin; unusual bruising or bleeding from the eye, gums, or nose -signs and symptoms of infection like fever or chills; cough; sore throat; pain or trouble passing urine -signs and symptoms of kidney injury like trouble  passing urine or change in the amount of urine -signs and symptoms of liver injury like dark yellow or brown urine; general ill feeling or flu-like symptoms; light-colored stools; loss of appetite; nausea; right upper belly pain; unusually weak or tired; yellowing of the eyes or skin Side effects that usually do not require medical attention (report to your doctor or health care professional if they continue or are bothersome): -constipation -dizziness -mouth sores -nausea, vomiting -pain, tingling, numbness in the hands or feet -unusually weak or tired This list may not describe all possible side effects. Call your doctor for medical advice about side effects. You may report side effects to FDA at 1-800-FDA-1088. Where should I keep my medicine? This drug is given in a hospital or clinic and will not be stored at home. NOTE: This sheet is a summary. It may not cover all possible information. If you have questions about this medicine, talk to your doctor, pharmacist, or health care provider.  2018 Elsevier/Gold Standard (2016-03-15 18:51:46) Denosumab injection What is this medicine? DENOSUMAB (den oh sue mab) slows bone breakdown. Prolia is used to treat osteoporosis in women after menopause and in  men. Delton See is used to treat a high calcium level due to cancer and to prevent bone fractures and other bone problems caused by multiple myeloma or cancer bone metastases. Delton See is also used to treat giant cell tumor of the bone. This medicine may be used for other purposes; ask your health care provider or pharmacist if you have questions. COMMON BRAND NAME(S): Prolia, XGEVA What should I tell my health care provider before I take this medicine? They need to know if you have any of these conditions: -dental disease -having surgery or tooth extraction -infection -kidney disease -low levels of calcium or Vitamin D in the blood -malnutrition -on hemodialysis -skin conditions or  sensitivity -thyroid or parathyroid disease -an unusual reaction to denosumab, other medicines, foods, dyes, or preservatives -pregnant or trying to get pregnant -breast-feeding How should I use this medicine? This medicine is for injection under the skin. It is given by a health care professional in a hospital or clinic setting. If you are getting Prolia, a special MedGuide will be given to you by the pharmacist with each prescription and refill. Be sure to read this information carefully each time. For Prolia, talk to your pediatrician regarding the use of this medicine in children. Special care may be needed. For Delton See, talk to your pediatrician regarding the use of this medicine in children. While this drug may be prescribed for children as young as 13 years for selected conditions, precautions do apply. Overdosage: If you think you have taken too much of this medicine contact a poison control center or emergency room at once. NOTE: This medicine is only for you. Do not share this medicine with others. What if I miss a dose? It is important not to miss your dose. Call your doctor or health care professional if you are unable to keep an appointment. What may interact with this medicine? Do not take this medicine with any of the following medications: -other medicines containing denosumab This medicine may also interact with the following medications: -medicines that lower your chance of fighting infection -steroid medicines like prednisone or cortisone This list may not describe all possible interactions. Give your health care provider a list of all the medicines, herbs, non-prescription drugs, or dietary supplements you use. Also tell them if you smoke, drink alcohol, or use illegal drugs. Some items may interact with your medicine. What should I watch for while using this medicine? Visit your doctor or health care professional for regular checks on your progress. Your doctor or health care  professional may order blood tests and other tests to see how you are doing. Call your doctor or health care professional for advice if you get a fever, chills or sore throat, or other symptoms of a cold or flu. Do not treat yourself. This drug may decrease your body's ability to fight infection. Try to avoid being around people who are sick. You should make sure you get enough calcium and vitamin D while you are taking this medicine, unless your doctor tells you not to. Discuss the foods you eat and the vitamins you take with your health care professional. See your dentist regularly. Brush and floss your teeth as directed. Before you have any dental work done, tell your dentist you are receiving this medicine. Do not become pregnant while taking this medicine or for 5 months after stopping it. Talk with your doctor or health care professional about your birth control options while taking this medicine. Women should inform their doctor if they  wish to become pregnant or think they might be pregnant. There is a potential for serious side effects to an unborn child. Talk to your health care professional or pharmacist for more information. What side effects may I notice from receiving this medicine? Side effects that you should report to your doctor or health care professional as soon as possible: -allergic reactions like skin rash, itching or hives, swelling of the face, lips, or tongue -bone pain -breathing problems -dizziness -jaw pain, especially after dental work -redness, blistering, peeling of the skin -signs and symptoms of infection like fever or chills; cough; sore throat; pain or trouble passing urine -signs of low calcium like fast heartbeat, muscle cramps or muscle pain; pain, tingling, numbness in the hands or feet; seizures -unusual bleeding or bruising -unusually weak or tired Side effects that usually do not require medical attention (report to your doctor or health care professional  if they continue or are bothersome): -constipation -diarrhea -headache -joint pain -loss of appetite -muscle pain -runny nose -tiredness -upset stomach This list may not describe all possible side effects. Call your doctor for medical advice about side effects. You may report side effects to FDA at 1-800-FDA-1088. Where should I keep my medicine? This medicine is only given in a clinic, doctor's office, or other health care setting and will not be stored at home. NOTE: This sheet is a summary. It may not cover all possible information. If you have questions about this medicine, talk to your doctor, pharmacist, or health care provider.  2018 Elsevier/Gold Standard (2016-06-07 19:17:21)

## 2018-01-08 ENCOUNTER — Encounter (HOSPITAL_COMMUNITY): Payer: Self-pay | Admitting: *Deleted

## 2018-01-08 ENCOUNTER — Telehealth: Payer: Self-pay | Admitting: *Deleted

## 2018-01-08 ENCOUNTER — Encounter: Payer: Self-pay | Admitting: Hematology & Oncology

## 2018-01-08 ENCOUNTER — Other Ambulatory Visit: Payer: Self-pay

## 2018-01-08 NOTE — Progress Notes (Signed)
Pt denies chest pain and being under the care of a cardiologist. Pt denies having a stress test and cardiac cath. Pt denies having a chest x ray and EKG within the last year. Pt made aware to stop taking vitamins, herbal medications, Black Elderberry, Milk Thistle, Jiaogulan supplement, Turmeric and Probiotic. Pt verbalized understanding of all pre-op instructions.

## 2018-01-08 NOTE — Telephone Encounter (Signed)
Patient is c/o increased nausea after this most recent cycle of chemo. She has had multiple cycles prior without nausea. With this last cycle she received Xgeva for the first time. She wants to know if this might be the cause of her nausea. She also mentions forgetting to take her steroids with this last cycle.  Reviewed symptoms with Arbie Cookey, PharmD. She believe the nausea is more likely related to the omitted steroids for this cycle.  Reviewed with patient. She understands the importance of taking steroids with her chemo cycles and will work to not forget again.

## 2018-01-09 ENCOUNTER — Ambulatory Visit (HOSPITAL_COMMUNITY): Payer: BLUE CROSS/BLUE SHIELD | Admitting: Certified Registered Nurse Anesthetist

## 2018-01-09 ENCOUNTER — Ambulatory Visit (HOSPITAL_COMMUNITY)
Admission: RE | Admit: 2018-01-09 | Discharge: 2018-01-09 | Disposition: A | Discharge status: Home / Self Care | Payer: BLUE CROSS/BLUE SHIELD | Source: Ambulatory Visit | Attending: Urology | Admitting: Urology

## 2018-01-09 ENCOUNTER — Encounter (HOSPITAL_COMMUNITY)
Admission: RE | Disposition: A | Discharge status: Home / Self Care | Payer: Self-pay | Source: Ambulatory Visit | Attending: Urology

## 2018-01-09 ENCOUNTER — Other Ambulatory Visit: Payer: Self-pay | Admitting: Oncology

## 2018-01-09 ENCOUNTER — Other Ambulatory Visit (HOSPITAL_COMMUNITY): Payer: Self-pay | Admitting: Oncology

## 2018-01-09 ENCOUNTER — Telehealth: Payer: Self-pay | Admitting: *Deleted

## 2018-01-09 ENCOUNTER — Ambulatory Visit (HOSPITAL_COMMUNITY): Payer: BLUE CROSS/BLUE SHIELD

## 2018-01-09 ENCOUNTER — Encounter (HOSPITAL_COMMUNITY): Payer: Self-pay

## 2018-01-09 DIAGNOSIS — C7951 Secondary malignant neoplasm of bone: Secondary | ICD-10-CM | POA: Insufficient documentation

## 2018-01-09 DIAGNOSIS — Z85118 Personal history of other malignant neoplasm of bronchus and lung: Secondary | ICD-10-CM | POA: Diagnosis not present

## 2018-01-09 DIAGNOSIS — C7931 Secondary malignant neoplasm of brain: Secondary | ICD-10-CM

## 2018-01-09 DIAGNOSIS — Z87891 Personal history of nicotine dependence: Secondary | ICD-10-CM | POA: Diagnosis not present

## 2018-01-09 DIAGNOSIS — C787 Secondary malignant neoplasm of liver and intrahepatic bile duct: Secondary | ICD-10-CM

## 2018-01-09 DIAGNOSIS — J90 Pleural effusion, not elsewhere classified: Secondary | ICD-10-CM | POA: Diagnosis not present

## 2018-01-09 DIAGNOSIS — Z923 Personal history of irradiation: Secondary | ICD-10-CM | POA: Diagnosis not present

## 2018-01-09 HISTORY — PX: RADIOLOGY WITH ANESTHESIA: SHX6223

## 2018-01-09 SURGERY — MRI WITH ANESTHESIA
Anesthesia: General

## 2018-01-09 MED ORDER — OXYCODONE HCL 5 MG PO TABS
10.0000 mg | ORAL_TABLET | Freq: Once | ORAL | Status: AC
  Administered 2018-01-09: 10 mg via ORAL

## 2018-01-09 MED ORDER — GADOBENATE DIMEGLUMINE 529 MG/ML IV SOLN
15.0000 mL | Freq: Once | INTRAVENOUS | Status: AC | PRN
  Administered 2018-01-09: 15 mL via INTRAVENOUS

## 2018-01-09 MED ORDER — OXYCODONE HCL 5 MG PO TABS
ORAL_TABLET | ORAL | Status: AC
  Administered 2018-01-09: 10 mg via ORAL
  Filled 2018-01-09: qty 2

## 2018-01-09 NOTE — Telephone Encounter (Signed)
Message received from patient stating that she has a new discoloration on her sides with the "appearance of bruising" and would like to know if this is a side effect from [place].  Spoke with Dr. Marin Olp regarding patient's concerns and he stated that patient may come in for CBC.  Call placed back to patient and spoke with patient's boyfriend, Herbie Baltimore.  He stated that pt is currently in MRI and that pt would like for information to be relayed to him.  Robert notified that Dr. Marin Olp would like for pt to come in for CBC.  He stated that he would call office back once he spoke with patient.

## 2018-01-09 NOTE — Progress Notes (Addendum)
Patient made RN aware of new "bruising" skin bilaterally over lateral rib cage.  Patient was concerned about "bruising" over her rib cage that she had not noticed before.  Explained to patient that it would not cause her to have her MRI postponed but that she needed to make her oncologist aware of the new finding.  Patient verbalized understanding of instructions and will speak with her oncologist about the new finding.

## 2018-01-09 NOTE — Anesthesia Preprocedure Evaluation (Signed)
Anesthesia Evaluation  Patient identified by MRN, date of birth, ID band Patient awake    Reviewed: Allergy & Precautions, NPO status , Patient's Chart, lab work & pertinent test results  History of Anesthesia Complications (+) PONV  Airway Mallampati: II  TM Distance: >3 FB     Dental   Pulmonary shortness of breath, pneumonia, former smoker,    breath sounds clear to auscultation       Cardiovascular negative cardio ROS   Rhythm:Regular Rate:Normal     Neuro/Psych  Headaches,    GI/Hepatic Neg liver ROS, GERD  ,  Endo/Other  negative endocrine ROS  Renal/GU negative Renal ROS     Musculoskeletal   Abdominal   Peds  Hematology   Anesthesia Other Findings   Reproductive/Obstetrics                             Anesthesia Physical Anesthesia Plan  ASA: III  Anesthesia Plan: General   Post-op Pain Management:    Induction: Intravenous  PONV Risk Score and Plan: Ondansetron, Dexamethasone and Midazolam  Airway Management Planned: LMA  Additional Equipment:   Intra-op Plan:   Post-operative Plan: Extubation in OR  Informed Consent: I have reviewed the patients History and Physical, chart, labs and discussed the procedure including the risks, benefits and alternatives for the proposed anesthesia with the patient or authorized representative who has indicated his/her understanding and acceptance.   Dental advisory given  Plan Discussed with: CRNA and Anesthesiologist  Anesthesia Plan Comments:         Anesthesia Quick Evaluation

## 2018-01-09 NOTE — Addendum Note (Signed)
Addendum  created 01/09/18 1535 by Ollen Bowl, CRNA   Charge Capture section accepted

## 2018-01-09 NOTE — Anesthesia Postprocedure Evaluation (Signed)
Anesthesia Post Note  Patient: AMBRY DIX  Procedure(s) Performed: MRI BRAIN WITH AND WITHOUT CONTRAST MRI THORACIC SPINE WITH AND WITHOUT CONTRAST (N/A )     Patient location during evaluation: PACU Anesthesia Type: General Level of consciousness: awake Pain management: pain level controlled Vital Signs Assessment: post-procedure vital signs reviewed and stable Respiratory status: spontaneous breathing Cardiovascular status: stable Postop Assessment: no headache Anesthetic complications: no    Last Vitals:  Vitals:   01/09/18 0614 01/09/18 1105  BP: 113/89 123/74  Pulse: (!) 104 (!) 102  Resp: 18 16  Temp: 37.4 C (!) 36.2 C  SpO2: 93% 97%    Last Pain:  Vitals:   01/09/18 0755  TempSrc:   PainSc: 5                  Atilano Covelli

## 2018-01-09 NOTE — Transfer of Care (Signed)
Immediate Anesthesia Transfer of Care Note  Patient: Debbie Baker  Procedure(s) Performed: MRI BRAIN WITH AND WITHOUT CONTRAST MRI THORACIC SPINE WITH AND WITHOUT CONTRAST (N/A )  Patient Location: PACU  Anesthesia Type:General  Level of Consciousness: awake, alert  and oriented  Airway & Oxygen Therapy: Patient Spontanous Breathing and Patient connected to nasal cannula oxygen  Post-op Assessment: Report given to RN, Post -op Vital signs reviewed and stable and Patient moving all extremities  Post vital signs: Reviewed and stable  Last Vitals:  Vitals Value Taken Time  BP 123/74 01/09/2018 11:07 AM  Temp 36.2 C 01/09/2018 11:05 AM  Pulse 96 01/09/2018 11:13 AM  Resp 13 01/09/2018 11:13 AM  SpO2 94 % 01/09/2018 11:13 AM  Vitals shown include unvalidated device data.  Last Pain:  Vitals:   01/09/18 1105  TempSrc:   PainSc: 0-No pain      Patients Stated Pain Goal: 2 (16/01/09 3235)  Complications: No apparent anesthesia complications

## 2018-01-10 ENCOUNTER — Encounter (HOSPITAL_COMMUNITY): Payer: Self-pay | Admitting: Radiology

## 2018-01-10 ENCOUNTER — Other Ambulatory Visit: Payer: Self-pay | Admitting: Radiation Therapy

## 2018-01-10 NOTE — Progress Notes (Signed)
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  Pain:Rib cage and back 4/10 taking oxycodone and fentanyl Headache:No Dizziness:No Nausea/vomiting:Yes taking compazine Ringing in ears:No Visual changes (Blurred/ diplopia double vision,blind spots, and peripheral vsion changes):No Fatigue:Yes Cognitive changes: Alert and oriented x 3 with fluent speech,able to complete sentences without difficulty with word finding or organization of sentences. Wt Readings from Last 3 Encounters:  01/16/18 162 lb 11.2 oz (73.8 kg)  01/04/18 164 lb (74.4 kg)  12/14/17 170 lb (77.1 kg)  BP 127/87 (BP Location: Right Arm, Patient Position: Sitting)   Pulse 93   Temp 97.8 F (36.6 C) (Oral)   Resp 18   Ht 5\' 8"  (1.727 m)   Wt 162 lb 11.2 oz (73.8 kg)   LMP  (LMP Unknown)   SpO2 100%   BMI 24.74 kg/m

## 2018-01-10 NOTE — Progress Notes (Signed)
MD.

## 2018-01-15 ENCOUNTER — Inpatient Hospital Stay: Payer: BLUE CROSS/BLUE SHIELD

## 2018-01-15 MED FILL — Fentanyl Citrate Preservative Free (PF) Inj 100 MCG/2ML: INTRAMUSCULAR | Qty: 2 | Status: AC

## 2018-01-15 MED FILL — Ondansetron HCl Inj 4 MG/2ML (2 MG/ML): INTRAMUSCULAR | Qty: 2 | Status: AC

## 2018-01-15 MED FILL — Midazolam HCl Inj 2 MG/2ML (Base Equivalent): INTRAMUSCULAR | Qty: 2 | Status: AC

## 2018-01-15 MED FILL — Propofol IV Emul 200 MG/20ML (10 MG/ML): INTRAVENOUS | Qty: 20 | Status: AC

## 2018-01-15 MED FILL — Phenylephrine-NaCl Pref Syr 0.4 MG/10ML-0.9% (40 MCG/ML): INTRAVENOUS | Qty: 10 | Status: AC

## 2018-01-15 MED FILL — Lidocaine HCl(Cardiac) IV PF Soln Pref Syr 100 MG/5ML (2%): INTRAVENOUS | Qty: 5 | Status: AC

## 2018-01-15 MED FILL — Dexamethasone Sod Phosphate Preservative Free Inj 10 MG/ML: INTRAMUSCULAR | Qty: 1 | Status: AC

## 2018-01-15 MED FILL — Succinylcholine Chloride Sol Pref Syr 200 MG/10ML (20 MG/ML): INTRAVENOUS | Qty: 10 | Status: AC

## 2018-01-16 ENCOUNTER — Other Ambulatory Visit: Payer: Self-pay

## 2018-01-16 ENCOUNTER — Ambulatory Visit
Admission: RE | Admit: 2018-01-16 | Discharge: 2018-01-16 | Disposition: A | Discharge status: Home / Self Care | Payer: BLUE CROSS/BLUE SHIELD | Source: Ambulatory Visit | Attending: Urology | Admitting: Urology

## 2018-01-16 VITALS — BP 127/87 | HR 93 | Temp 97.8°F | Resp 18 | Ht 68.0 in | Wt 162.7 lb

## 2018-01-16 DIAGNOSIS — Z923 Personal history of irradiation: Secondary | ICD-10-CM | POA: Diagnosis not present

## 2018-01-16 DIAGNOSIS — C7931 Secondary malignant neoplasm of brain: Secondary | ICD-10-CM

## 2018-01-16 DIAGNOSIS — Z880 Allergy status to penicillin: Secondary | ICD-10-CM | POA: Insufficient documentation

## 2018-01-16 DIAGNOSIS — Z79899 Other long term (current) drug therapy: Secondary | ICD-10-CM | POA: Insufficient documentation

## 2018-01-16 NOTE — Progress Notes (Addendum)
Radiation Oncology         [phone]   Name: Debbie Baker   Date: 01/16/2018  MRN: [id number]  DOB: [birth year]   Multidisciplinary Brain and Spine Oncology Clinic Follow-Up Visit Note  CC: Debbie Fess, MD  Curt Bears, MD    ICD-10-CM   1. Brain metastases ([place]) C79.31     Diagnosis:   52 y.o. 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  Narrative:  The patient returns today for routine follow-up and review of recent repeat brain imaging as well as thoracic spine imaging for progressive low back pain.  Due to the progression and severity of her back pain, she required her scans be performed under sedation.  Originally, Dr. Marin Olp had requested that she have a repeat PET scan but because this could not be done under sedation we opted to proceed with thoracic imaging in addition to the brain imaging.  The recent films were presented in our multidisciplinary conference with neuroradiology on 01/15/18.  MRI of the brain with and without contrast on 01/09/2018 showed mild progression of the three small cerebellar metastases as compared to prior MRIs from January and March. These remain small (6 mm or less) and as before, no associated edema or mass effect. No brand new metastatic disease.  Additionally, there is a punctate new area of enhancement in the anterior right middle frontal gyrus at the gray-white junction of indeterminate significance but warrants close follow-up.  On Thoracic MRI, there are small T5-T8 vertebral body metastases measuring up to 10 to 12 mm as well as a 17 mm L1 bone metastasis but no evidence of extraosseous,  epidural or intradural thoracic spine metastatic disease.  She is currently being treated with maintenance chemotherapy with Alimta and Avastin every 3 weeks for metastatic disease to the liver and has completed 5 cycles.  She continues to tolerate her systemic treatments well.  Her most recent systemic imaging with CT C/A/P on 09/21/17 showed overall disease stability.   On review of systems, the patient reports she is doing fairly well in general.  She continues to tolerate her systemic chemotherapy relatively well.  She continues with chronic mid back pain which is being managed with Fentanyl patches and Percocet prn. She denies shooting pain into the buttocks or legs or numbness/tingling in the LEs.  She reports mild shortness of breath on exertion but unchanged recently. She denies chest pain, cough, hemoptysis, night sweats, chills, fever, unintended weight loss, nausea or vomiting. She reports intermittent frontal headaches upon waking that improve over the course of the day and she feels that these are related to sinus/allergy flares- unchanged recently. She denies diplopia, blurry vision or tinnitus.                      ALLERGIES:  is allergic to penicillins and chantix [varenicline].  Meds: Current Outpatient Medications  Medication Sig Dispense Refill  . antiseptic oral rinse (BIOTENE) LIQD 15 mLs by Mouth Rinse route as needed for dry mouth. 473 mL 3  . benzonatate (TESSALON) 100 MG capsule Take 1 capsule (100 mg total)  by mouth every 4 (four) hours as needed for cough. 60 capsule 1  . Black Elderberry (SAMBUCUS ELDERBERRY PO) Take 50 mg by mouth daily.    . Calcium-Magnesium-Vitamin D 185-50-100 MG-MG-UNIT CAPS Take 1 tablet by mouth every evening.    . cetirizine (ZYRTEC) 10 MG tablet Take 10 mg by mouth daily.    Marland Kitchen dexamethasone (DECADRON) 4 MG tablet Take 1 tablet twice a day the day before, day of, and day after each cycle of chemotherapy. 30 tablet 4  . dronabinol (MARINOL) 5 MG  capsule Take 1 capsule (5 mg total) by mouth 2 (two) times daily before lunch and supper. 60 capsule 0  . fentaNYL (DURAGESIC - DOSED MCG/HR) 100 MCG/HR Place 1 patch (100 mcg total) onto the skin every other day. 15 patch 0  . folic acid (FOLVITE) 1 MG tablet Take 1 tablet (1 mg total) by mouth daily. 30 tablet 4  . Lactobacillus (PROBIOTIC ACIDOPHILUS) CAPS Take 1 capsule by mouth daily.     Marland Kitchen lidocaine-prilocaine (EMLA) cream Apply 1 application topically as needed. 30 g 2  . LORazepam (ATIVAN) 0.5 MG tablet Take 1 tablet (0.5 mg total) by mouth as needed for anxiety (take 30 min prior to MRI scans and treatments). 30 tablet 0  . magic mouthwash SOLN Take 5 mLs by mouth 4 (four) times daily -  with meals and at bedtime. Called in 08/05/17 by on call RN, Components benadryl  525 mg, hydrocortisone 60 mg and nystatin 0.6 mg. 240 ml 240 mL 0  . magnesium citrate SOLN Take 1 Bottle by mouth daily as needed for moderate constipation or severe constipation.    . Milk Thistle 1000 MG CAPS Take 1 capsule by mouth daily.    . Multiple Vitamins-Minerals (WOMENS 50+ MULTI VITAMIN/MIN) TABS Take 1 tablet by mouth daily.    Marland Kitchen OVER THE COUNTER MEDICATION Take 800 mg by mouth daily. Jiaogulan Supplement    . oxyCODONE 20 MG TABS Take 1 tablet (20 mg total) by mouth every 6 (six) hours as needed for severe pain. 120 tablet 0  . pantoprazole (PROTONIX) 40 MG tablet Take 1 tablet (40 mg total) 2 (two) times daily by mouth. 180 tablet 3  . pravastatin (PRAVACHOL) 20 MG tablet Take 20 mg by mouth daily.   3  . prochlorperazine (COMPAZINE) 10 MG tablet Take 1 tablet (10 mg total) by mouth every 6 (six) hours as needed for nausea or vomiting. 30 tablet 0  . PSYLLIUM HUSK PO Take 500 mg by mouth 2 (two) times daily.     . TURMERIC CURCUMIN PO Take 2,050 mg by mouth 2 (two) times daily.    Marland Kitchen albuterol (PROVENTIL HFA;VENTOLIN HFA) 108 (90 Base) MCG/ACT inhaler Inhale 2 puffs into the lungs every 6 (six) hours as needed  for wheezing or shortness of breath. (Patient not taking: Reported on 01/01/2018) 1 Inhaler 0  . diphenhydrAMINE (BENADRYL) 25 mg capsule Take 25 mg by mouth at bedtime as needed.     No current facility-administered medications for this encounter.    Facility-Administered Medications Ordered in Other Encounters  Medication Dose Route Frequency Provider Last Rate Last Dose  . sodium chloride flush (NS) 0.9 % injection 10 mL  10 mL Intracatheter PRN Curt Bears, MD      . sodium chloride flush (NS) 0.9 % injection 10 mL  10 mL Intravenous PRN Curt Bears, MD   10 mL at 09/21/17 1103    Physical Findings:  height is 5'  8" (1.727 m) and weight is 162 lb 11.2 oz (73.8 kg). Her oral temperature is 97.8 F (36.6 C). Her blood pressure is 127/87 and her pulse is 93. Her respiration is 18 and oxygen saturation is 100%.   In general this is a well appearing caucasian female in no acute distress. She's alert and oriented x4 and appropriate throughout the examination. Cardiopulmonary assessment is negative for acute distress and she exhibits normal effort. She appears grossly neurologically intact.  Speech is fluent and appropriate.  EOMs intact bilaterally and PERRLA. Strength is 5/5 and equal in bilateral upper and lower extremities.  Sensation is intact to light tough in the upper and lower extremities. Gait is normal.   Lab Findings: Lab Results  Component Value Date   WBC 3.8 (L) 01/04/2018   HGB 12.9 01/04/2018   HCT 40.0 01/04/2018   MCV 95.5 01/04/2018   PLT 214 01/04/2018    @LASTCHEM @  Radiographic Findings: Mr Jeri Cos GY Contrast  Addendum Date: 01/16/2018   ADDENDUM REPORT: 01/16/2018 07:45 ADDENDUM: This case was reviewed at the [facility] on 01/15/2018. A punctate new area of enhancement in the anterior right middle frontal gyrus at the gray-white junction was noted on series 3, image 92 by Dr. Sharl Ma and discussed at the conference. Attention is  directed on follow-up. Electronically Signed   By: Genevie Ann M.D.   On: 01/16/2018 07:45   Result Date: 01/16/2018 CLINICAL DATA:  52 year old female with metastatic non-small cell lung cancer. Suspected small cerebellar metastases, but stable with systemic therapy - no dedicated treatment thus far. Restaging. EXAM: MRI HEAD WITHOUT AND WITH CONTRAST TECHNIQUE: Multiplanar, multiecho pulse sequences of the brain and surrounding structures were obtained without and with intravenous contrast. CONTRAST:  33mL MULTIHANCE GADOBENATE DIMEGLUMINE 529 MG/ML IV SOLN COMPARISON:  Brain MRI 08/25/2017 and earlier. Thoracic spine MRI today reported separately. FINDINGS: BRAIN New Lesions: None. Larger lesions: Untreated enhancing lesion located in the left superior cerebellum is punctate but increasing in conspicuity over this series of exams. No edema and mass effect. See series 30, image 35. The untreated left lateral cerebellar lesion remains enhancing and has increased from about 4-6 millimeters since January as seen on series 30, image 29. Still, there is no associated edema or mass effect. The remaining chronic cerebellar enhancing lesion located posteriorly on the right near midline is seen on series 30, image 28. It has not decreased in size but appears more indistinct now, measuring 3-4 millimeters. However, subtle ring like T2 signal abnormality associated with this lesion is distinct on series 29, image 15 and increased since January. No associated edema or mass effect. Other Brain findings: Incidental multiple bilateral cerebral hemisphere developmental venous anomalies (normal variant) with expected enhancement but no other enhancing brain lesion. No dural thickening. Stable cerebral volume. No restricted diffusion to suggest acute infarction. No midline shift, mass effect, ventriculomegaly, extra-axial collection or acute intracranial hemorrhage. Cervicomedullary junction and pituitary are within normal limits.  There is perhaps trace hemosiderin associated with the two dominant cerebellar lesions as seen on series 8 image 21. Vascular: Major intracranial vascular flow voids are stable. The major dural venous sinuses are enhancing and appear patent. Skull and upper cervical spine: Negative visible cervical spine and spinal cord. Visible bone marrow signal remains normal. Sinuses/Orbits: Orbits soft tissues remain normal. Paranasal sinus mucosal thickening has increased. Other: Mastoid air cells remain clear. Visible internal auditory structures appear normal. Scalp and face soft tissues appear negative. IMPRESSION: 1.  Mild progression of three suspected small cerebellar metastases since January. These remain small (6 mm or less) and as before, no associated edema or mass effect. No brand new metastatic disease. 2. Total of 3 enhancing brain metastases, each annotated on series 30. 3. Thoracic spine MRI today reported separately. Electronically Signed: By: Genevie Ann M.D. On: 01/09/2018 13:58   Mr Thoracic Spine W Wo Contrast  Result Date: 01/09/2018 CLINICAL DATA:  52 year old female with metastatic non-small cell lung cancer. Status post radiation therapy to the chest (primary tumor and mediastinal adenopathy) in December 2017 and January 2018. Restaging. EXAM: MRI THORACIC WITHOUT AND WITH CONTRAST TECHNIQUE: Multiplanar and multiecho pulse sequences of the thoracic spine were obtained without and with intravenous contrast. CONTRAST:  66mL MULTIHANCE GADOBENATE DIMEGLUMINE 529 MG/ML IV SOLN in conjunction with contrast enhanced imaging of the brain reported separately. COMPARISON:  CT chest abdomen and pelvis 09/21/2017 and earlier. PET-CT 04/04/2016 FINDINGS: Limited cervical spine imaging: Visible cervical spine bone marrow signal is within normal limits. Thoracic spine segmentation:  Normal on the comparison CT. Alignment:  Preserved thoracic vertebral height and alignment. Vertebrae: Heterogeneously increased T1 and  T2 marrow signal within the T1 through T7 vertebral bodies may reflect previous radiation treatment. Following contrast, there is patchy heterogeneous enhancement of these levels, especially T5 through T7 (series 26, image 8) which is of unclear significance. However, there are small superimposed active bone metastases suspected in the T5 and T7 vertebral bodies (series 19, images 10 and 11) which demonstrate abnormal increased STIR signal. These measure up to 10-12 millimeters individually. There could be punctate additional active bone metastases in those levels (T6 on image 8). There is a similar small T8 superior vertebral body metastasis to the right of midline (series 19, image 7). No other thoracic vertebral metastasis is identified. However, there is a 17 millimeter metastasis in the inferior L1 vertebral body (series 19, image 11). No extraosseous or epidural tumor. No posterior rib metastasis identified. Cord: Spinal cord signal is within normal limits at all visualized levels. No abnormal intradural enhancement. No dural thickening. Paraspinal and other soft tissues: Layering pleural effusions are new since 09/21/2017, small to moderate on the left and moderate to large on the right. No pericardial effusion. Abnormal left perihilar lung signal similar to the April CT appearance. Heterogeneous appearance of the right hepatic lobe. Otherwise negative visible upper abdominal viscera. Negative visualized posterior paraspinal soft tissues. Disc levels: Occasional small thoracic disc herniations (e.g. T9-T10 on series 21, image 16) with no thoracic spinal stenosis. IMPRESSION: 1. Positive for thoracic spine and L1 osseous metastatic disease. Small T5 through T8 vertebral body metastases (punctate up to 10-12 mm) are superimposed on non-specific marrow signal changes and enhancement which may be the sequelae of prior chest radiation. The largest visible bone metastasis is at L1 measuring 17 mm. 2. No  extraosseous, epidural, or intradural thoracic spine metastatic disease. 3. New moderate to large layering pleural effusions since 09/21/2017. Electronically Signed   By: Genevie Ann M.D.   On: 01/09/2018 16:48    Impression/Plan:  UMAIZA MATUSIK is a 52 y.o. woman with Stage IV metastatic NSCLC, adenocarcinoma.  1. Brain metastases.  She has three subcentimeter small cerebellar lesions which appear consistent with metastases.  Based on recent discussion and review at recent multidisciplinary brain conference, the recommendation is to proceed with treatment of the three metastatic lesions which have shown gradual progression more recently.  She appears to be a good candidate for stereotactic radiosurgery [facility])  but could also consider whole brain radiotherapy (WBRT).  There are pros and cons associated with each of these potential treatment options. Whole brain radiotherapy would treat the known metastatic deposits and help provide some reduction of risk for future brain metastases. However, whole brain radiotherapy carries potential risks including hair loss, subacute somnolence, and neurocognitive changes including a possible reduction in short-term memory. Whole brain radiotherapy also may carry a lower likelihood of tumor control at the treatment sites because of the low-dose used. Stereotactic radiosurgery carries a higher likelihood for local tumor control at the targeted sites with lower associated risk for neurocognitive changes such as memory loss. However, the use of stereotactic radiosurgery in this setting may leave the patient at increased risk for new brain metastases elsewhere in the brain as high as 50-60%. Accordingly, patients who receive stereotactic radiosurgery in this setting should undergo ongoing surveillance imaging with brain MRI more frequently in order to identify and treat new small brain metastases before they become symptomatic. Stereotactic radiosurgery does carry some different risks,  including a risk of radionecrosis. Today, I reviewed the findings and workup thus far with the patient. We discussed the dilemma regarding whole brain radiotherapy versus stereotactic radiosurgery. We discussed the pros and cons of each. We also discussed the logistics and delivery of each. We reviewed the results associated with each of the treatments described above. The patient seems to understand the treatment options and would like to proceed with stereotactic radiosurgery.  2. Spine metastases.  Patient has progressive, severe and debilitating mid back pain which is only moderately controlled with pain medication if taken around the clock. Recent thoracic MRI demonstrates small T5-T8 vertebral body metastases measuring up to 10 to 12 mm as well as a 17 mm L1 bone metastasis but no evidence of extraosseous, epidural or intradural thoracic spine metastatic disease.  We discussed the natural history of metastatic carcinoma and general treatment, highlighting the role of palliative radiotherapy in the management of pain associated with bony mets. We discussed the available radiation techniques, and focused on the details of logistics and delivery. The recommendation is to proceed with a course of palliative radiotherapy to the T5-T8 and L1 lesions delivered over the course of 10 daily treatments.  We reviewed the anticipated acute and late sequelae associated with radiation in this setting. The patient was encouraged to ask questions that were answered to her satisfaction.  She is interested in proceeding with the recommended course of palliative XRT.  PLAN: She is interested in proceeding with SRS treatment to the brain lesions as well as palliative radiotherapy to the spinal metastases.  She is scheduled for CT simulation at 10:00am on 01/17/2018 in hopes that we can complete SRS simulation as well as spine simulation.  This will be dependent on her pain control regarding the length of time she is able to lie  flat on the table for planning purposes.  She will take her pain medications approximately 30 minutes prior to her appointment in an effort to increase our chances of success.  She understands that if we are unable to complete treatment planning for the brain on 01/17/2018, we will move forward and complete her spine treatment in hopes of getting good pain relief and will follow up there after to determine treatment planning for the brain which will likely involve a repeat brain MRI.  We would anticipate beginning treatment to the spine on Thursday, 01/18/2018 and SRS treatment to the brain in approximately 1 week if planning is  succcessful on 01/17/18.  Mont Dutton will coordinate her [facility] treatment as well as a consult visit with 1 of the local neurosurgeons.  She is comfortable with and in agreement with this plan.     Nicholos Johns, PA-C

## 2018-01-17 ENCOUNTER — Telehealth: Payer: Self-pay | Admitting: Radiation Oncology

## 2018-01-17 ENCOUNTER — Ambulatory Visit
Admission: RE | Admit: 2018-01-17 | Discharge: 2018-01-17 | Disposition: A | Discharge status: Home / Self Care | Payer: BLUE CROSS/BLUE SHIELD | Source: Ambulatory Visit | Attending: Radiation Oncology | Admitting: Radiation Oncology

## 2018-01-17 ENCOUNTER — Ambulatory Visit
Admission: RE | Admit: 2018-01-17 | Payer: BLUE CROSS/BLUE SHIELD | Source: Ambulatory Visit | Admitting: Radiation Oncology

## 2018-01-17 ENCOUNTER — Encounter: Payer: Self-pay | Admitting: Radiation Therapy

## 2018-01-17 DIAGNOSIS — C3412 Malignant neoplasm of upper lobe, left bronchus or lung: Secondary | ICD-10-CM | POA: Diagnosis not present

## 2018-01-17 DIAGNOSIS — Z510 Encounter for antineoplastic radiation therapy: Secondary | ICD-10-CM | POA: Diagnosis present

## 2018-01-17 DIAGNOSIS — C7951 Secondary malignant neoplasm of bone: Secondary | ICD-10-CM | POA: Diagnosis not present

## 2018-01-17 DIAGNOSIS — C7931 Secondary malignant neoplasm of brain: Secondary | ICD-10-CM

## 2018-01-17 DIAGNOSIS — C7949 Secondary malignant neoplasm of other parts of nervous system: Secondary | ICD-10-CM

## 2018-01-17 DIAGNOSIS — C7952 Secondary malignant neoplasm of bone marrow: Principal | ICD-10-CM

## 2018-01-17 NOTE — Telephone Encounter (Signed)
Received voicemail message from patient requesting a return call. Phoned patient back. Patient questions if every treatment will require port access. Explained that port access isn't necessary for radiation treatments just planning. Patient verbalized understanding and expressed appreciation for the return call.

## 2018-01-17 NOTE — Progress Notes (Signed)
Power port accessed per protocol. After access patient expressed desire to treat spine before brain. Informed Ashlyn Bruning, PA-C of these findings. Access no longer needed. Flushed port per protocol and removed access needle. Needle intact upon removal. Bandaid applied to access site. Patient tolerated all well. Patient ambulated with husband and staff to simulation room in no distress.

## 2018-01-17 NOTE — Addendum Note (Signed)
Encounter addended by: Tyler Pita, MD on: 01/17/2018 7:58 PM  Actions taken: Medication List reviewed, Problem List reviewed, Allergies reviewed, Sign clinical note

## 2018-01-17 NOTE — Progress Notes (Signed)
  Radiation Oncology         [phone]) [phone] ________________________________  Name: Debbie Baker MRN: [id number]  Date: 01/17/2018  DOB: 21-Jan-1966  SIMULATION AND TREATMENT PLANNING NOTE    ICD-10-CM   1. Spine metastasis ([place]) C79.51     DIAGNOSIS:  52 y.o. female patient with thoracic and lumbar metastasis      NARRATIVE:  The patient was brought to the [place].  Identity was confirmed.  All relevant records and images related to the planned course of therapy were reviewed.  The patient freely provided informed written consent to proceed with treatment after reviewing the details related to the planned course of therapy. The consent form was witnessed and verified by the simulation staff.  Then, the patient was set-up in a stable reproducible  supine position for radiation therapy.  CT images were obtained.  Surface markings were placed.  The CT images were loaded into the planning software.  Then the target and avoidance structures were contoured including kidneys.  Treatment planning then occurred.  The radiation prescription was entered and confirmed.  Then, I designed and supervised the construction of a total of 3 medically necessary complex treatment devices with VacLoc positioner and 2 MLCs to shield kidneys.  I have requested : 3D Simulation  I have requested a DVH of the following structures: Left Kidney, Right Kidney and target.  PLAN:  The patient will receive 30 Gy in 10 fractions.  ________________________________  Sheral Apley Tammi Klippel, M.D.  This document serves as a record of services personally performed by Tyler Pita, MD. It was created on his behalf by Wilburn Mylar, a trained medical scribe. The creation of this record is based on the scribe's personal observations and the provider's statements to them. This document has been checked and approved by the attending provider.

## 2018-01-18 MED FILL — FOLIC ACID 1 MG TABS: 1 | 30 days supply | Qty: 30 | Fill #1

## 2018-01-18 NOTE — Progress Notes (Addendum)
Brain and Spine Tumor Board Documentation  Debbie Baker was presented by Cecil Cobbs, MD at Brain and Spine Tumor Board on 01/18/2018, which included representatives from neuro oncology, radiation oncology, surgical oncology, radiology, pathology, navigation, genetics.  Debbie Baker was presented as a current patient with history of the following treatments:  .  Additionally, we reviewed previous medical and familial history, history of present illness, and recent lab results along with all available histopathologic and imaging studies. The tumor board considered available treatment options and made the following recommendations:  Adjuvant radiation Radiosurgery to 3 progressive brain lesions.  Will follow tiny focus of enhancement in R frontal lobe.  Tumor board is a meeting of clinicians from various specialty areas who evaluate and discuss patients for whom a multidisciplinary approach is being considered. Final determinations in the plan of care are those of the provider(s). The responsibility for follow up of recommendations given during tumor board is that of the provider.   Today's extended care, comprehensive team conference, Debbie Baker was not present for the discussion and was not examined.    This addendum to the note was entered after the visit with Debbie Baker.  Debbie Baker was not interested in proceeding with treatment to her brain due to pain in her thoracic and lumbar spine preventing her from laying still or flat. She will proceed with treatment to her spine for now and plan for the brain to be treated when she feels she is able to tolerate it.   Mont Dutton R.T.(R)(T)

## 2018-01-18 NOTE — Progress Notes (Signed)
Debbie Baker was not interested in doing the brain [facility] scheduled on 8/21. Her priority is relief for the back pain she is experiencing. Once she has pain relief and feels she can lie flat for at least 30 min, she will reconsider, but for now, she is only interested with having her back treated.   Debbie Baker was informed that if there is a time delay between the MRI and the actual treatment date, she may have to have another MRI for accurate treatment planning.   Mont Dutton R.T.(R)(T) Special Procedures Navigator [phone]

## 2018-01-22 ENCOUNTER — Ambulatory Visit
Admission: RE | Admit: 2018-01-22 | Discharge: 2018-01-22 | Disposition: A | Discharge status: Home / Self Care | Payer: BLUE CROSS/BLUE SHIELD | Source: Ambulatory Visit | Attending: Radiation Oncology | Admitting: Radiation Oncology

## 2018-01-22 DIAGNOSIS — C7951 Secondary malignant neoplasm of bone: Secondary | ICD-10-CM | POA: Diagnosis not present

## 2018-01-22 NOTE — Progress Notes (Addendum)
Debbie Baker was not interested in proceeding with treatment to her brain due to pain in her thoracic and lumbar spine preventing her from laying still or flat. She will proceed with treatment to her spine for now and plan for the brain to be treated when she feels she is able to tolerate it.   Mont Dutton R.T.(R)(T)

## 2018-01-23 ENCOUNTER — Ambulatory Visit
Admission: RE | Admit: 2018-01-23 | Discharge: 2018-01-23 | Disposition: A | Discharge status: Home / Self Care | Payer: BLUE CROSS/BLUE SHIELD | Source: Ambulatory Visit | Attending: Radiation Oncology | Admitting: Radiation Oncology

## 2018-01-23 DIAGNOSIS — C7951 Secondary malignant neoplasm of bone: Secondary | ICD-10-CM | POA: Diagnosis not present

## 2018-01-24 ENCOUNTER — Ambulatory Visit
Admission: RE | Admit: 2018-01-24 | Discharge: 2018-01-24 | Disposition: A | Discharge status: Home / Self Care | Payer: BLUE CROSS/BLUE SHIELD | Source: Ambulatory Visit | Attending: Radiation Oncology | Admitting: Radiation Oncology

## 2018-01-24 DIAGNOSIS — C7951 Secondary malignant neoplasm of bone: Secondary | ICD-10-CM | POA: Diagnosis not present

## 2018-01-25 ENCOUNTER — Inpatient Hospital Stay (HOSPITAL_BASED_OUTPATIENT_CLINIC_OR_DEPARTMENT_OTHER): Payer: BLUE CROSS/BLUE SHIELD | Admitting: Hematology & Oncology

## 2018-01-25 ENCOUNTER — Inpatient Hospital Stay: Payer: BLUE CROSS/BLUE SHIELD

## 2018-01-25 ENCOUNTER — Other Ambulatory Visit: Payer: Self-pay | Admitting: *Deleted

## 2018-01-25 ENCOUNTER — Ambulatory Visit
Admission: RE | Admit: 2018-01-25 | Discharge: 2018-01-25 | Disposition: A | Discharge status: Home / Self Care | Payer: BLUE CROSS/BLUE SHIELD | Source: Ambulatory Visit | Attending: Radiation Oncology | Admitting: Radiation Oncology

## 2018-01-25 ENCOUNTER — Ambulatory Visit (HOSPITAL_BASED_OUTPATIENT_CLINIC_OR_DEPARTMENT_OTHER)
Admission: RE | Admit: 2018-01-25 | Discharge: 2018-01-25 | Disposition: A | Discharge status: Home / Self Care | Payer: BLUE CROSS/BLUE SHIELD | Source: Ambulatory Visit | Attending: Hematology & Oncology | Admitting: Hematology & Oncology

## 2018-01-25 ENCOUNTER — Other Ambulatory Visit: Payer: Self-pay

## 2018-01-25 VITALS — BP 135/89

## 2018-01-25 VITALS — BP 138/97 | HR 101 | Temp 97.4°F | Resp 18 | Wt 152.1 lb

## 2018-01-25 DIAGNOSIS — R0789 Other chest pain: Secondary | ICD-10-CM

## 2018-01-25 DIAGNOSIS — Z923 Personal history of irradiation: Secondary | ICD-10-CM | POA: Insufficient documentation

## 2018-01-25 DIAGNOSIS — C7951 Secondary malignant neoplasm of bone: Secondary | ICD-10-CM

## 2018-01-25 DIAGNOSIS — Y842 Radiological procedure and radiotherapy as the cause of abnormal reaction of the patient, or of later complication, without mention of misadventure at the time of the procedure: Secondary | ICD-10-CM | POA: Insufficient documentation

## 2018-01-25 DIAGNOSIS — J90 Pleural effusion, not elsewhere classified: Secondary | ICD-10-CM

## 2018-01-25 DIAGNOSIS — C3492 Malignant neoplasm of unspecified part of left bronchus or lung: Secondary | ICD-10-CM

## 2018-01-25 DIAGNOSIS — M542 Cervicalgia: Secondary | ICD-10-CM

## 2018-01-25 DIAGNOSIS — C3412 Malignant neoplasm of upper lobe, left bronchus or lung: Secondary | ICD-10-CM | POA: Diagnosis not present

## 2018-01-25 DIAGNOSIS — C7931 Secondary malignant neoplasm of brain: Secondary | ICD-10-CM

## 2018-01-25 DIAGNOSIS — Z95828 Presence of other vascular implants and grafts: Secondary | ICD-10-CM

## 2018-01-25 DIAGNOSIS — R911 Solitary pulmonary nodule: Secondary | ICD-10-CM | POA: Diagnosis not present

## 2018-01-25 DIAGNOSIS — R109 Unspecified abdominal pain: Secondary | ICD-10-CM

## 2018-01-25 DIAGNOSIS — Z79899 Other long term (current) drug therapy: Secondary | ICD-10-CM

## 2018-01-25 DIAGNOSIS — M549 Dorsalgia, unspecified: Secondary | ICD-10-CM

## 2018-01-25 LAB — CMP (CANCER CENTER ONLY)
ALK PHOS: 195 U/L — AB (ref 26–84)
ALT: 27 U/L (ref 10–47)
AST: 41 U/L — AB (ref 11–38)
Albumin: 3.7 g/dL (ref 3.5–5.0)
Anion gap: 8 (ref 5–15)
BUN: 6 mg/dL — AB (ref 7–22)
CALCIUM: 9.3 mg/dL (ref 8.0–10.3)
CO2: 25 mmol/L (ref 18–33)
Chloride: 101 mmol/L (ref 98–108)
Creatinine: 0.7 mg/dL (ref 0.60–1.20)
GLUCOSE: 105 mg/dL (ref 73–118)
Potassium: 3.8 mmol/L (ref 3.3–4.7)
Sodium: 134 mmol/L (ref 128–145)
TOTAL PROTEIN: 7.4 g/dL (ref 6.4–8.1)
Total Bilirubin: 0.9 mg/dL (ref 0.2–1.6)

## 2018-01-25 LAB — CBC WITH DIFFERENTIAL/PLATELET
BASOS ABS: 0 10*3/uL (ref 0.0–0.1)
BASOS PCT: 0 %
EOS ABS: 0 10*3/uL (ref 0.0–0.7)
EOS PCT: 0 %
HCT: 43.7 % (ref 36.0–46.0)
Hemoglobin: 14.7 g/dL (ref 12.0–15.0)
Lymphocytes Relative: 14 %
Lymphs Abs: 0.7 10*3/uL (ref 0.7–4.0)
MCH: 30.9 pg (ref 26.0–34.0)
MCHC: 33.6 g/dL (ref 30.0–36.0)
MCV: 91.8 fL (ref 78.0–100.0)
MONO ABS: 0.8 10*3/uL (ref 0.1–1.0)
MONOS PCT: 16 %
Neutro Abs: 3.4 10*3/uL (ref 1.7–7.7)
Neutrophils Relative %: 70 %
PLATELETS: 285 10*3/uL (ref 150–400)
RBC: 4.76 MIL/uL (ref 3.87–5.11)
RDW: 14.7 % (ref 11.5–15.5)
WBC: 4.8 10*3/uL (ref 4.0–10.5)

## 2018-01-25 LAB — LACTATE DEHYDROGENASE: LDH: 272 U/L — AB (ref 98–192)

## 2018-01-25 MED ORDER — ALBUTEROL SULFATE HFA 108 (90 BASE) MCG/ACT IN AERS: 2.0000 | INHALATION_SPRAY | Freq: Four times a day (QID) | RESPIRATORY_TRACT | 3 refills | Status: AC | PRN

## 2018-01-25 MED ORDER — HYDROMORPHONE HCL 4 MG/ML IJ SOLN
6.0000 mg | Freq: Once | INTRAMUSCULAR | Status: AC
Start: 2018-01-25 — End: 2018-01-25
  Administered 2018-01-25: 6 mg via INTRAVENOUS

## 2018-01-25 MED ORDER — LIDOCAINE-PRILOCAINE 2.5-2.5 % EX CREA: 1.0000 "application " | TOPICAL_CREAM | CUTANEOUS | 2 refills | Status: AC | PRN

## 2018-01-25 MED ORDER — LIDOCAINE-PRILOCAINE 2.5-2.5 % EX CREA: 1.0000 "application " | TOPICAL_CREAM | CUTANEOUS | 2 refills | Status: DC | PRN

## 2018-01-25 MED ORDER — PROCHLORPERAZINE MALEATE 10 MG PO TABS
ORAL_TABLET | ORAL | Status: AC
  Filled 2018-01-25: qty 1

## 2018-01-25 MED ORDER — HYDROMORPHONE HCL 4 MG/ML IJ SOLN
INTRAMUSCULAR | Status: AC
  Filled 2018-01-25: qty 1

## 2018-01-25 MED ORDER — SODIUM CHLORIDE 0.9% FLUSH
10.0000 mL | INTRAVENOUS | Status: DC | PRN
  Administered 2018-01-25: 10 mL
  Filled 2018-01-25: qty 10

## 2018-01-25 MED ORDER — ALBUTEROL SULFATE HFA 108 (90 BASE) MCG/ACT IN AERS: 2.0000 | INHALATION_SPRAY | Freq: Four times a day (QID) | RESPIRATORY_TRACT | 0 refills | Status: DC | PRN

## 2018-01-25 MED ORDER — SODIUM CHLORIDE 0.9 % IV SOLN
INTRAVENOUS | Status: DC
  Administered 2018-01-25: 12:00:00 via INTRAVENOUS
  Filled 2018-01-25: qty 250

## 2018-01-25 MED ORDER — HYDROMORPHONE HCL 2 MG/ML IJ SOLN
INTRAMUSCULAR | Status: AC
  Filled 2018-01-25: qty 1

## 2018-01-25 MED ORDER — OXYCODONE HCL 20 MG PO TABS: 20.0000 mg | ORAL_TABLET | ORAL | 0 refills | Status: DC | PRN

## 2018-01-25 MED ORDER — HEPARIN SOD (PORK) LOCK FLUSH 100 UNIT/ML IV SOLN
500.0000 [IU] | Freq: Once | INTRAVENOUS | Status: AC | PRN
  Administered 2018-01-25: 500 [IU]
  Filled 2018-01-25: qty 5

## 2018-01-25 MED FILL — VENTOLIN HFA 90 MCG INHALER: 108 (90 BAS | 25 days supply | Qty: 18 | Fill #0

## 2018-01-25 MED FILL — oxyCODONE HCL 20 MG TABS: 20 | 20 days supply | Qty: 120 | Fill #0

## 2018-01-25 MED FILL — LIDOCAINE-PRILOCAINE CREAM: 2.5-2.5 | 30 days supply | Qty: 30 | Fill #0

## 2018-01-25 NOTE — Progress Notes (Signed)
Hematology and Oncology Follow Up Visit  Debbie Baker [id number] Nov 07, 1965 52 y.o. 01/25/2018   Principle Diagnosis:   Metastatic adenocarcinoma of the lung-no actionable mutations  Current Therapy:    Maintenance therapy with Alimta/Avastin -- s/p cycle #5 -- d/c on 01/25/2018 for progression  Xgeva 120 mg SQ q 3 months -- next dose on 03/2018     Interim History:  Debbie Baker is back for her follow-up.  Unfortunately, we have evidence of progressive disease.  She had a MRI that was finally done.  Again, she has had horrible problems with pain.  Try to get her pain under control has been very difficult.  She has not been able to lie down.  She was able to be done under general anesthesia.  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  Feels like she also had some progression of bony disease.  Also noted was some pleural effusions.  I did do a chest x-ray on her today.  The chest x-ray really did not show much in the way of a pleural effusion.  I really do not think that we have to continue her on the Alimta/Avastin.  I do still think this is working.  She is starting radiation therapy.  She is starting radiation to her spine and then will have brain radiation.  This point I will take a good 3 weeks.  Again, her main focus has always been pain.  She is on fentanyl patch 100 mcg every 2 days.  Her oxycodone to 20 mg every 6 hours was not enough.  We will increase this to 20 mg every 4 hours.  I just feel bad that she suffers.  She has a an incurable disease.  We are not going to get her into remission.  It be nice if we could just possibly get her pain better so she can enjoy some quality of life.  She has a hard time lying down for the radiation treatments because of her back discomfort.  May be, the radiation will help with her pain.  I am not sure as to how we can proceed with systemic therapy.  In the past, she has had carboplatinum with Taxol.  She has had immunotherapy with  Durvalumab.  She has not seen gemcitabine or docetaxel or navelbine.  I think these would be reasonable.  We also might consider etoposide.  Given that we really cannot get a biopsy as these are bony lesions, I do not think we can really use targeted therapy.  Thankfully, her appetite seems to be doing fairly well.  Overall, I would have to say that her performance status is ECOG 1-2.  Medications:  Current Outpatient Medications:  .  albuterol (PROVENTIL HFA;VENTOLIN HFA) 108 (90 Base) MCG/ACT inhaler, Inhale 2 puffs into the lungs every 6 (six) hours as needed for wheezing or shortness of breath., Disp: 1 Inhaler, Rfl: 0 .  antiseptic oral rinse (BIOTENE) LIQD, 15 mLs by Mouth Rinse route as needed for dry mouth., Disp: 473 mL, Rfl: 3 .  benzonatate (TESSALON) 100 MG capsule, Take 1 capsule (100 mg total) by mouth every 4 (four) hours as needed for cough., Disp: 60 capsule, Rfl: 1 .  Black Elderberry (SAMBUCUS ELDERBERRY PO), Take 50 mg by mouth daily., Disp: , Rfl:  .  Calcium-Magnesium-Vitamin D 185-50-100 MG-MG-UNIT CAPS, Take 1 tablet by mouth every evening., Disp: , Rfl:  .  cetirizine (ZYRTEC) 10 MG tablet, Take 10 mg by mouth daily., Disp: , Rfl:  .  dexamethasone (DECADRON) 4 MG tablet, Take 1 tablet twice a day the day before, day of, and day after each cycle of chemotherapy., Disp: 30 tablet, Rfl: 4 .  diphenhydrAMINE (BENADRYL) 25 mg capsule, Take 25 mg by mouth at bedtime as needed., Disp: , Rfl:  .  dronabinol (MARINOL) 5 MG capsule, Take 1 capsule (5 mg total) by mouth 2 (two) times daily before lunch and supper., Disp: 60 capsule, Rfl: 0 .  fentaNYL (DURAGESIC - DOSED MCG/HR) 100 MCG/HR, Place 1 patch (100 mcg total) onto the skin every other day., Disp: 15 patch, Rfl: 0 .  folic acid (FOLVITE) 1 MG tablet, Take 1 tablet (1 mg total) by mouth daily., Disp: 30 tablet, Rfl: 4 .  Lactobacillus (PROBIOTIC ACIDOPHILUS) CAPS, Take 1 capsule by mouth daily. , Disp: , Rfl:  .   lidocaine-prilocaine (EMLA) cream, Apply 1 application topically as needed., Disp: 30 g, Rfl: 2 .  LORazepam (ATIVAN) 0.5 MG tablet, Take 1 tablet (0.5 mg total) by mouth as needed for anxiety (take 30 min prior to MRI scans and treatments)., Disp: 30 tablet, Rfl: 0 .  magic mouthwash SOLN, Take 5 mLs by mouth 4 (four) times daily -  with meals and at bedtime. Called in 08/05/17 by on call RN, Components benadryl  525 mg, hydrocortisone 60 mg and nystatin 0.6 mg. 240 ml, Disp: 240 mL, Rfl: 0 .  magnesium citrate SOLN, Take 1 Bottle by mouth daily as needed for moderate constipation or severe constipation., Disp: , Rfl:  .  Milk Thistle 1000 MG CAPS, Take 1 capsule by mouth daily., Disp: , Rfl:  .  Multiple Vitamins-Minerals (WOMENS 50+ MULTI VITAMIN/MIN) TABS, Take 1 tablet by mouth daily., Disp: , Rfl:  .  OVER THE COUNTER MEDICATION, Take 800 mg by mouth daily. Jiaogulan Supplement, Disp: , Rfl:  .  oxyCODONE 20 MG TABS, Take 1 tablet (20 mg total) by mouth every 6 (six) hours as needed for severe pain., Disp: 120 tablet, Rfl: 0 .  pantoprazole (PROTONIX) 40 MG tablet, Take 1 tablet (40 mg total) 2 (two) times daily by mouth., Disp: 180 tablet, Rfl: 3 .  pravastatin (PRAVACHOL) 20 MG tablet, Take 20 mg by mouth daily. , Disp: , Rfl: 3 .  prochlorperazine (COMPAZINE) 10 MG tablet, Take 1 tablet (10 mg total) by mouth every 6 (six) hours as needed for nausea or vomiting., Disp: 30 tablet, Rfl: 0 .  PSYLLIUM HUSK PO, Take 500 mg by mouth 2 (two) times daily. , Disp: , Rfl:  .  TURMERIC CURCUMIN PO, Take 2,050 mg by mouth 2 (two) times daily., Disp: , Rfl:  No current facility-administered medications for this visit.   Facility-Administered Medications Ordered in Other Visits:  .  sodium chloride flush (NS) 0.9 % injection 10 mL, 10 mL, Intracatheter, PRN, Curt Bears, MD .  sodium chloride flush (NS) 0.9 % injection 10 mL, 10 mL, Intravenous, PRN, Curt Bears, MD, 10 mL at 09/21/17  1103  Allergies:  Allergies  Allergen Reactions  . Penicillins Anaphylaxis and Rash     Has patient had a PCN reaction causing immediate rash, facial/tongue/throat swelling, SOB or lightheadedness with hypotension: # # YES # # Has patient had a PCN reaction causing severe rash involving mucus membranes or skin necrosis: No Has patient had a PCN reaction that required hospitalization No Has patient had a PCN reaction occurring within the last 10 years: # # YES # #  If all of the above answers are "NO",  then may proceed with Cephalosporin use.   . Chantix [Varenicline] Other (See Comments)    Nightmares/lack of sleep    Past Medical History, Surgical history, Social history, and Family History were reviewed and updated.  Review of Systems: Review of Systems  Constitutional: Negative.   HENT:  Negative.   Eyes: Negative.   Respiratory: Positive for chest tightness.   Cardiovascular: Negative.   Gastrointestinal: Positive for abdominal pain.  Genitourinary: Negative.    Musculoskeletal: Positive for back pain and neck pain.  Skin: Negative.   Neurological: Negative.   Hematological: Negative.   Psychiatric/Behavioral: Negative.     Physical Exam:  weight is 152 lb 1 oz (69 kg). Her oral temperature is 97.4 F (36.3 C) (abnormal). Her blood pressure is 138/97 (abnormal) and her pulse is 101 (abnormal). Her respiration is 18 and oxygen saturation is 100%.   Wt Readings from Last 3 Encounters:  01/25/18 152 lb 1 oz (69 kg)  01/25/18 152 lb 1 oz (69 kg)  01/17/18 161 lb 12.8 oz (73.4 kg)    Physical Exam  Constitutional: She is oriented to person, place, and time.  HENT:  Head: Normocephalic and atraumatic.  Mouth/Throat: Oropharynx is clear and moist.  Eyes: Pupils are equal, round, and reactive to light. EOM are normal.  Neck: Normal range of motion.  Cardiovascular: Normal rate, regular rhythm and normal heart sounds.  Pulmonary/Chest: Effort normal and breath sounds  normal.  Abdominal: Soft. Bowel sounds are normal.  Musculoskeletal: Normal range of motion. She exhibits no edema, tenderness or deformity.  Lymphadenopathy:    She has no cervical adenopathy.  Neurological: She is alert and oriented to person, place, and time.  Skin: Skin is warm and dry. No rash noted. No erythema.  Psychiatric: She has a normal mood and affect. Her behavior is normal. Judgment and thought content normal.  Vitals reviewed.    Lab Results  Component Value Date   WBC 4.8 01/25/2018   HGB 14.7 01/25/2018   HCT 43.7 01/25/2018   MCV 91.8 01/25/2018   PLT 285 01/25/2018     Chemistry      Component Value Date/Time   NA 136 01/04/2018 0950   NA 140 05/22/2017 1212   K 3.4 01/04/2018 0950   K 4.2 05/22/2017 1212   CL 102 01/04/2018 0950   CO2 28 01/04/2018 0950   CO2 21 (L) 05/22/2017 1212   BUN 9 01/04/2018 0950   BUN 10.3 05/22/2017 1212   CREATININE 0.80 01/04/2018 0950   CREATININE 0.8 05/22/2017 1212      Component Value Date/Time   CALCIUM 9.7 01/04/2018 0950   CALCIUM 9.6 05/22/2017 1212   ALKPHOS 217 (H) 01/04/2018 0950   ALKPHOS 113 05/22/2017 1212   AST 61 (H) 01/04/2018 0950   AST 17 05/22/2017 1212   ALT 46 01/04/2018 0950   ALT 27 05/22/2017 1212   BILITOT 0.7 01/04/2018 0950   BILITOT 0.31 05/22/2017 1212       Impression and Plan: Debbie Baker is a 52 year old white female with metastatic adenocarcinoma of the lung.   For right now, we will go ahead and discontinue the Alimta/Avastin.  I am not really sure how much these are help in his right now.  Since she is getting radiation, I think this is more important for her.  I really do not think we have to move along with systemic therapy right now.  Hopefully, she will get some relief from radiation therapy.  Unfortunately, I does  have a feeling that she is going a very little relief from radiation because she has had a high requirement for her pain medications and I am sure has some  tolerance to these agents.  I would like to see her back in about 3 or 4 weeks.  By then, she should be close to finishing her radiation.  I spent about 40 minutes with her today.  All the time spent face-to-face.  I counseled her and helped with answering her questions and try to reassure her that we would still do our best to help with her pain issues.  This is her biggest focus and her only focus.  She told me that she is not concerned with quantity of life.  Is all about quality of life.  I totally agree with her.     Volanda Napoleon, MD 8/29/201910:55 AM

## 2018-01-25 NOTE — Progress Notes (Signed)
Dr Marin Olp spoke with pt regarding holding treatment today and going to radiology for CXR today.  Pt escorted to radiology via RN.  Pt without complaints at time of discharge and states that pain level is down to an 8.

## 2018-01-26 ENCOUNTER — Ambulatory Visit
Admission: RE | Admit: 2018-01-26 | Discharge: 2018-01-26 | Disposition: A | Discharge status: Home / Self Care | Payer: BLUE CROSS/BLUE SHIELD | Source: Ambulatory Visit | Attending: Radiation Oncology | Admitting: Radiation Oncology

## 2018-01-26 ENCOUNTER — Encounter: Payer: Self-pay | Admitting: *Deleted

## 2018-01-26 DIAGNOSIS — C7951 Secondary malignant neoplasm of bone: Secondary | ICD-10-CM | POA: Diagnosis not present

## 2018-01-30 ENCOUNTER — Ambulatory Visit
Admission: RE | Admit: 2018-01-30 | Discharge: 2018-01-30 | Disposition: A | Discharge status: Home / Self Care | Payer: BLUE CROSS/BLUE SHIELD | Source: Ambulatory Visit | Attending: Radiation Oncology | Admitting: Radiation Oncology

## 2018-01-30 DIAGNOSIS — Z510 Encounter for antineoplastic radiation therapy: Secondary | ICD-10-CM | POA: Insufficient documentation

## 2018-01-30 DIAGNOSIS — C7951 Secondary malignant neoplasm of bone: Secondary | ICD-10-CM | POA: Insufficient documentation

## 2018-01-30 DIAGNOSIS — C3492 Malignant neoplasm of unspecified part of left bronchus or lung: Secondary | ICD-10-CM | POA: Diagnosis not present

## 2018-01-31 ENCOUNTER — Ambulatory Visit
Admission: RE | Admit: 2018-01-31 | Discharge: 2018-01-31 | Disposition: A | Discharge status: Home / Self Care | Payer: BLUE CROSS/BLUE SHIELD | Source: Ambulatory Visit | Attending: Radiation Oncology | Admitting: Radiation Oncology

## 2018-01-31 DIAGNOSIS — Z510 Encounter for antineoplastic radiation therapy: Secondary | ICD-10-CM | POA: Diagnosis not present

## 2018-02-01 ENCOUNTER — Ambulatory Visit
Admission: RE | Admit: 2018-02-01 | Discharge: 2018-02-01 | Disposition: A | Discharge status: Home / Self Care | Payer: BLUE CROSS/BLUE SHIELD | Source: Ambulatory Visit | Attending: Radiation Oncology | Admitting: Radiation Oncology

## 2018-02-01 ENCOUNTER — Other Ambulatory Visit: Payer: Self-pay | Admitting: *Deleted

## 2018-02-01 DIAGNOSIS — Z510 Encounter for antineoplastic radiation therapy: Secondary | ICD-10-CM | POA: Diagnosis not present

## 2018-02-01 MED ORDER — FENTANYL 100 MCG/HR TD PT72: 100.0000 ug | MEDICATED_PATCH | TRANSDERMAL | 0 refills | Status: DC

## 2018-02-02 ENCOUNTER — Telehealth: Payer: Self-pay | Admitting: *Deleted

## 2018-02-02 ENCOUNTER — Other Ambulatory Visit: Payer: Self-pay | Admitting: *Deleted

## 2018-02-02 ENCOUNTER — Ambulatory Visit
Admission: RE | Admit: 2018-02-02 | Discharge: 2018-02-02 | Disposition: A | Discharge status: Home / Self Care | Payer: BLUE CROSS/BLUE SHIELD | Source: Ambulatory Visit | Attending: Radiation Oncology | Admitting: Radiation Oncology

## 2018-02-02 DIAGNOSIS — Z510 Encounter for antineoplastic radiation therapy: Secondary | ICD-10-CM | POA: Diagnosis not present

## 2018-02-02 MED ORDER — MELOXICAM 15 MG PO TABS: 15.0000 mg | ORAL_TABLET | Freq: Every day | ORAL | 2 refills | Status: DC

## 2018-02-02 MED ORDER — PROCHLORPERAZINE MALEATE 10 MG PO TABS: 10.0000 mg | ORAL_TABLET | Freq: Four times a day (QID) | ORAL | 1 refills | Status: AC | PRN

## 2018-02-02 NOTE — Telephone Encounter (Signed)
Call received from patient requesting an anti-inflammatory to help her while she is receiving XRT d/t she has difficulty lying flat for treatment. Informed pt that I would speak with Dr. Marin Olp and call her back with instructions.

## 2018-02-02 NOTE — Telephone Encounter (Signed)
Call placed back to patient to inform her that Dr. Marin Olp would like for her to try Mobic 15 mg daily.  Patient appreciative of call back and [facility] [address]

## 2018-02-05 ENCOUNTER — Ambulatory Visit
Admission: RE | Admit: 2018-02-05 | Discharge: 2018-02-05 | Disposition: A | Discharge status: Home / Self Care | Payer: BLUE CROSS/BLUE SHIELD | Source: Ambulatory Visit | Attending: Radiation Oncology | Admitting: Radiation Oncology

## 2018-02-05 ENCOUNTER — Ambulatory Visit: Payer: BLUE CROSS/BLUE SHIELD

## 2018-02-05 DIAGNOSIS — Z510 Encounter for antineoplastic radiation therapy: Secondary | ICD-10-CM | POA: Diagnosis not present

## 2018-02-06 ENCOUNTER — Ambulatory Visit
Admission: RE | Admit: 2018-02-06 | Discharge: 2018-02-06 | Disposition: A | Discharge status: Home / Self Care | Payer: BLUE CROSS/BLUE SHIELD | Source: Ambulatory Visit | Attending: Radiation Oncology | Admitting: Radiation Oncology

## 2018-02-06 DIAGNOSIS — Z510 Encounter for antineoplastic radiation therapy: Secondary | ICD-10-CM | POA: Diagnosis not present

## 2018-02-07 ENCOUNTER — Ambulatory Visit
Admission: RE | Admit: 2018-02-07 | Discharge: 2018-02-07 | Disposition: A | Discharge status: Home / Self Care | Payer: BLUE CROSS/BLUE SHIELD | Source: Ambulatory Visit | Attending: Radiation Oncology | Admitting: Radiation Oncology

## 2018-02-07 DIAGNOSIS — Z510 Encounter for antineoplastic radiation therapy: Secondary | ICD-10-CM | POA: Diagnosis not present

## 2018-02-08 ENCOUNTER — Ambulatory Visit
Admission: RE | Admit: 2018-02-08 | Discharge: 2018-02-08 | Disposition: A | Discharge status: Home / Self Care | Payer: BLUE CROSS/BLUE SHIELD | Source: Ambulatory Visit | Attending: Radiation Oncology | Admitting: Radiation Oncology

## 2018-02-08 ENCOUNTER — Ambulatory Visit: Payer: BLUE CROSS/BLUE SHIELD

## 2018-02-08 DIAGNOSIS — Z510 Encounter for antineoplastic radiation therapy: Secondary | ICD-10-CM | POA: Diagnosis not present

## 2018-02-09 ENCOUNTER — Ambulatory Visit
Admission: RE | Admit: 2018-02-09 | Discharge: 2018-02-09 | Disposition: A | Discharge status: Home / Self Care | Payer: BLUE CROSS/BLUE SHIELD | Source: Ambulatory Visit | Attending: Radiation Oncology | Admitting: Radiation Oncology

## 2018-02-09 DIAGNOSIS — Z510 Encounter for antineoplastic radiation therapy: Secondary | ICD-10-CM | POA: Diagnosis not present

## 2018-02-12 ENCOUNTER — Ambulatory Visit
Admission: RE | Admit: 2018-02-12 | Discharge: 2018-02-12 | Disposition: A | Discharge status: Home / Self Care | Payer: BLUE CROSS/BLUE SHIELD | Source: Ambulatory Visit | Attending: Radiation Oncology | Admitting: Radiation Oncology

## 2018-02-12 DIAGNOSIS — Z510 Encounter for antineoplastic radiation therapy: Secondary | ICD-10-CM | POA: Diagnosis not present

## 2018-02-13 ENCOUNTER — Telehealth: Payer: Self-pay | Admitting: Internal Medicine

## 2018-02-13 ENCOUNTER — Ambulatory Visit
Admission: RE | Admit: 2018-02-13 | Discharge: 2018-02-13 | Disposition: A | Discharge status: Home / Self Care | Payer: BLUE CROSS/BLUE SHIELD | Source: Ambulatory Visit | Attending: Radiation Oncology | Admitting: Radiation Oncology

## 2018-02-13 DIAGNOSIS — Z510 Encounter for antineoplastic radiation therapy: Secondary | ICD-10-CM | POA: Diagnosis not present

## 2018-02-13 NOTE — Telephone Encounter (Signed)
Faxed medical records to [facility], Release ID: [id number]

## 2018-02-14 ENCOUNTER — Ambulatory Visit
Admission: RE | Admit: 2018-02-14 | Discharge: 2018-02-14 | Disposition: A | Discharge status: Home / Self Care | Payer: BLUE CROSS/BLUE SHIELD | Source: Ambulatory Visit | Attending: Radiation Oncology | Admitting: Radiation Oncology

## 2018-02-14 DIAGNOSIS — Z510 Encounter for antineoplastic radiation therapy: Secondary | ICD-10-CM | POA: Diagnosis not present

## 2018-02-15 ENCOUNTER — Ambulatory Visit: Payer: BLUE CROSS/BLUE SHIELD | Admitting: Hematology & Oncology

## 2018-02-15 ENCOUNTER — Other Ambulatory Visit: Payer: Self-pay | Admitting: *Deleted

## 2018-02-15 ENCOUNTER — Other Ambulatory Visit: Payer: BLUE CROSS/BLUE SHIELD

## 2018-02-15 ENCOUNTER — Ambulatory Visit: Payer: BLUE CROSS/BLUE SHIELD

## 2018-02-15 ENCOUNTER — Ambulatory Visit
Admission: RE | Admit: 2018-02-15 | Discharge: 2018-02-15 | Disposition: A | Discharge status: Home / Self Care | Payer: BLUE CROSS/BLUE SHIELD | Source: Ambulatory Visit | Attending: Radiation Oncology | Admitting: Radiation Oncology

## 2018-02-15 DIAGNOSIS — C3492 Malignant neoplasm of unspecified part of left bronchus or lung: Secondary | ICD-10-CM

## 2018-02-15 DIAGNOSIS — Z510 Encounter for antineoplastic radiation therapy: Secondary | ICD-10-CM | POA: Diagnosis not present

## 2018-02-15 MED ORDER — FOLIC ACID 1 MG PO TABS: 1.0000 mg | ORAL_TABLET | Freq: Every day | ORAL | 4 refills | Status: DC

## 2018-02-15 MED ORDER — OXYCODONE HCL 20 MG PO TABS: 20.0000 mg | ORAL_TABLET | ORAL | 0 refills | Status: DC | PRN

## 2018-02-15 MED FILL — oxyCODONE HCL 20 MG TABS: 20 | 20 days supply | Qty: 120 | Fill #0

## 2018-02-15 MED FILL — FOLIC ACID 1 MG TABS: 1 | 30 days supply | Qty: 30 | Fill #0

## 2018-02-16 ENCOUNTER — Ambulatory Visit
Admission: RE | Admit: 2018-02-16 | Discharge: 2018-02-16 | Disposition: A | Discharge status: Home / Self Care | Payer: BLUE CROSS/BLUE SHIELD | Source: Ambulatory Visit | Attending: Radiation Oncology | Admitting: Radiation Oncology

## 2018-02-16 DIAGNOSIS — Z510 Encounter for antineoplastic radiation therapy: Secondary | ICD-10-CM | POA: Diagnosis not present

## 2018-02-19 ENCOUNTER — Encounter: Payer: Self-pay | Admitting: *Deleted

## 2018-02-19 ENCOUNTER — Ambulatory Visit
Admission: RE | Admit: 2018-02-19 | Discharge: 2018-02-19 | Disposition: A | Discharge status: Home / Self Care | Payer: BLUE CROSS/BLUE SHIELD | Source: Ambulatory Visit | Attending: Radiation Oncology | Admitting: Radiation Oncology

## 2018-02-19 DIAGNOSIS — Z510 Encounter for antineoplastic radiation therapy: Secondary | ICD-10-CM | POA: Diagnosis not present

## 2018-02-19 NOTE — Progress Notes (Signed)
[place] Work  Patient came to [place] office requesting information on Social Security Disability and DNR form.  CSW and patient discussed SSD.  Patient originally applied in March 2018 with [facility], but was still working full time therefore did not receive benefits.  Since 2018 patient has had disease progression and is only working 5 hours a week, and would like to reapply for SSD.  CSW completed referral to [facility], and they will contact patient to initiate application process.  CSW also provided patient with DNR form and advance directives booklet.  Patient stated she has an appointment tomorrow with her medical oncologist and will review DNR form.  Patient plans to contact CSW if she wishes to complete AD's.  CSW provided contact information and encouraged patient to call with questions or concerns.    Johnnye Lana, MSW, LCSW, OSW-C Clinical Social Worker [facility] [phone]

## 2018-02-20 ENCOUNTER — Inpatient Hospital Stay: Payer: BLUE CROSS/BLUE SHIELD

## 2018-02-20 ENCOUNTER — Inpatient Hospital Stay: Payer: BLUE CROSS/BLUE SHIELD | Attending: Internal Medicine

## 2018-02-20 ENCOUNTER — Ambulatory Visit: Payer: BLUE CROSS/BLUE SHIELD

## 2018-02-20 ENCOUNTER — Other Ambulatory Visit: Payer: Self-pay

## 2018-02-20 ENCOUNTER — Inpatient Hospital Stay (HOSPITAL_BASED_OUTPATIENT_CLINIC_OR_DEPARTMENT_OTHER): Payer: BLUE CROSS/BLUE SHIELD | Admitting: Hematology & Oncology

## 2018-02-20 ENCOUNTER — Encounter: Payer: Self-pay | Admitting: Hematology & Oncology

## 2018-02-20 VITALS — BP 92/63 | HR 107 | Temp 98.5°F | Resp 18 | Wt 150.0 lb

## 2018-02-20 DIAGNOSIS — Z880 Allergy status to penicillin: Secondary | ICD-10-CM

## 2018-02-20 DIAGNOSIS — C3412 Malignant neoplasm of upper lobe, left bronchus or lung: Secondary | ICD-10-CM | POA: Diagnosis not present

## 2018-02-20 DIAGNOSIS — C7931 Secondary malignant neoplasm of brain: Secondary | ICD-10-CM

## 2018-02-20 DIAGNOSIS — Z79899 Other long term (current) drug therapy: Secondary | ICD-10-CM | POA: Insufficient documentation

## 2018-02-20 DIAGNOSIS — C7951 Secondary malignant neoplasm of bone: Secondary | ICD-10-CM

## 2018-02-20 DIAGNOSIS — Z452 Encounter for adjustment and management of vascular access device: Secondary | ICD-10-CM | POA: Insufficient documentation

## 2018-02-20 DIAGNOSIS — M542 Cervicalgia: Secondary | ICD-10-CM

## 2018-02-20 DIAGNOSIS — Z9221 Personal history of antineoplastic chemotherapy: Secondary | ICD-10-CM | POA: Diagnosis not present

## 2018-02-20 DIAGNOSIS — R109 Unspecified abdominal pain: Secondary | ICD-10-CM | POA: Insufficient documentation

## 2018-02-20 DIAGNOSIS — R0789 Other chest pain: Secondary | ICD-10-CM | POA: Diagnosis not present

## 2018-02-20 DIAGNOSIS — Z923 Personal history of irradiation: Secondary | ICD-10-CM | POA: Insufficient documentation

## 2018-02-20 DIAGNOSIS — Z95828 Presence of other vascular implants and grafts: Secondary | ICD-10-CM

## 2018-02-20 DIAGNOSIS — M549 Dorsalgia, unspecified: Secondary | ICD-10-CM | POA: Diagnosis not present

## 2018-02-20 DIAGNOSIS — C3492 Malignant neoplasm of unspecified part of left bronchus or lung: Secondary | ICD-10-CM

## 2018-02-20 LAB — CBC WITH DIFFERENTIAL (CANCER CENTER ONLY)
BASOS PCT: 1 %
Basophils Absolute: 0 10*3/uL (ref 0.0–0.1)
Eosinophils Absolute: 0.1 10*3/uL (ref 0.0–0.5)
Eosinophils Relative: 3 %
HEMATOCRIT: 42.3 % (ref 34.8–46.6)
HEMOGLOBIN: 13.8 g/dL (ref 11.6–15.9)
LYMPHS ABS: 0.4 10*3/uL — AB (ref 0.9–3.3)
LYMPHS PCT: 11 %
MCH: 30.7 pg (ref 26.0–34.0)
MCHC: 32.6 g/dL (ref 32.0–36.0)
MCV: 94 fL (ref 81.0–101.0)
MONOS PCT: 15 %
Monocytes Absolute: 0.6 10*3/uL (ref 0.1–0.9)
NEUTROS ABS: 2.5 10*3/uL (ref 1.5–6.5)
Neutrophils Relative %: 70 %
Platelet Count: 171 10*3/uL (ref 145–400)
RBC: 4.5 MIL/uL (ref 3.70–5.32)
RDW: 14.8 % (ref 11.1–15.7)
WBC Count: 3.6 10*3/uL — ABNORMAL LOW (ref 3.9–10.0)

## 2018-02-20 LAB — CMP (CANCER CENTER ONLY)
ALBUMIN: 3.2 g/dL — AB (ref 3.5–5.0)
ALK PHOS: 283 U/L — AB (ref 26–84)
ALT: 89 U/L — ABNORMAL HIGH (ref 10–47)
ANION GAP: 0 — AB (ref 5–15)
AST: 120 U/L — ABNORMAL HIGH (ref 11–38)
BILIRUBIN TOTAL: 0.8 mg/dL (ref 0.2–1.6)
BUN: 8 mg/dL (ref 7–22)
CHLORIDE: 105 mmol/L (ref 98–108)
CO2: 29 mmol/L (ref 18–33)
CREATININE: 0.7 mg/dL (ref 0.60–1.20)
Calcium: 8.8 mg/dL (ref 8.0–10.3)
Glucose, Bld: 112 mg/dL (ref 73–118)
Potassium: 3.8 mmol/L (ref 3.3–4.7)
Sodium: 131 mmol/L (ref 128–145)
Total Protein: 6.3 g/dL — ABNORMAL LOW (ref 6.4–8.1)

## 2018-02-20 MED ORDER — HEPARIN SOD (PORK) LOCK FLUSH 100 UNIT/ML IV SOLN
500.0000 [IU] | Freq: Once | INTRAVENOUS | Status: AC
  Administered 2018-02-20: 500 [IU] via INTRAVENOUS
  Filled 2018-02-20: qty 5

## 2018-02-20 MED ORDER — FENTANYL 100 MCG/HR TD PT72: 100.0000 ug | MEDICATED_PATCH | TRANSDERMAL | 0 refills | Status: AC

## 2018-02-20 MED ORDER — SODIUM CHLORIDE 0.9% FLUSH
10.0000 mL | INTRAVENOUS | Status: DC | PRN
  Administered 2018-02-20: 10 mL via INTRAVENOUS
  Filled 2018-02-20: qty 10

## 2018-02-21 ENCOUNTER — Encounter: Payer: Self-pay | Admitting: Hematology & Oncology

## 2018-02-22 NOTE — Progress Notes (Signed)
Hematology and Oncology Follow Up Visit  LEASA KINCANNON [id number] 1965-10-13 52 y.o. 02/22/2018   Principle Diagnosis:   Metastatic adenocarcinoma of the lung-no actionable mutations  Current Therapy:    Maintenance therapy with Alimta/Avastin -- s/p cycle #5 -- d/c on 01/25/2018 for progression  Xgeva 120 mg SQ q 3 months -- next dose on 03/2018  Radiation Therapy to the back - completed 02/19/2018     Interim History:  Ms. Lamarca is back for her follow-up.  She has completed radiation therapy to her spine.  She seemed to do okay with this.  She has had a little bit of pain relief.  Hopefully, she will continue to have relief as she goes further out from her treatments.  She still cannot lie down flat long enough to do the [facility] for her CNS metastasis.  We have to move ahead and get her on some kind of systemic therapy for her lung cancer.  I think that a combination of gemcitabine/vinorelbine would be reasonable.  There might be some CNS penetration of the chemotherapy.  I believe that she would tolerate this.  Her quality of life is what is most important to her.  She does not want to take treatment and then be sick and miserable.  She still is dealing with all the pain issues.  She is on a fentanyl patch.  She is on oxycodone.  She has had no leading.  There is been no change in bowel or bladder habits.  She has had no cough or shortness of breath.  Currently, her performance status is ECOG 1-2.    Medications:  Current Outpatient Medications:  .  albuterol (PROVENTIL HFA;VENTOLIN HFA) 108 (90 Base) MCG/ACT inhaler, Inhale 2 puffs into the lungs every 6 (six) hours as needed for wheezing or shortness of breath., Disp: 2 Inhaler, Rfl: 3 .  antiseptic oral rinse (BIOTENE) LIQD, 15 mLs by Mouth Rinse route as needed for dry mouth., Disp: 473 mL, Rfl: 3 .  benzonatate (TESSALON) 100 MG capsule, Take 1 capsule (100 mg total) by mouth every 4 (four) hours as needed for cough., Disp: 60  capsule, Rfl: 1 .  Black Elderberry (SAMBUCUS ELDERBERRY PO), Take 50 mg by mouth daily., Disp: , Rfl:  .  Calcium-Magnesium-Vitamin D 185-50-100 MG-MG-UNIT CAPS, Take 1 tablet by mouth every evening., Disp: , Rfl:  .  cetirizine (ZYRTEC) 10 MG tablet, Take 10 mg by mouth daily., Disp: , Rfl:  .  dexamethasone (DECADRON) 4 MG tablet, Take 1 tablet twice a day the day before, day of, and day after each cycle of chemotherapy., Disp: 30 tablet, Rfl: 4 .  diphenhydrAMINE (BENADRYL) 25 mg capsule, Take 25 mg by mouth at bedtime as needed., Disp: , Rfl:  .  dronabinol (MARINOL) 5 MG capsule, Take 1 capsule (5 mg total) by mouth 2 (two) times daily before lunch and supper., Disp: 60 capsule, Rfl: 0 .  fentaNYL (DURAGESIC - DOSED MCG/HR) 100 MCG/HR, Place 1 patch (100 mcg total) onto the skin every other day., Disp: 15 patch, Rfl: 0 .  folic acid (FOLVITE) 1 MG tablet, Take 1 tablet (1 mg total) by mouth daily., Disp: 30 tablet, Rfl: 4 .  Lactobacillus (PROBIOTIC ACIDOPHILUS) CAPS, Take 1 capsule by mouth daily. , Disp: , Rfl:  .  lidocaine-prilocaine (EMLA) cream, Apply 1 application topically as needed., Disp: 30 g, Rfl: 2 .  LORazepam (ATIVAN) 0.5 MG tablet, Take 1 tablet (0.5 mg total) by mouth as needed for anxiety (  take 30 min prior to MRI scans and treatments)., Disp: 30 tablet, Rfl: 0 .  magic mouthwash SOLN, Take 5 mLs by mouth 4 (four) times daily -  with meals and at bedtime. Called in 08/05/17 by on call RN, Components benadryl  525 mg, hydrocortisone 60 mg and nystatin 0.6 mg. 240 ml, Disp: 240 mL, Rfl: 0 .  magnesium citrate SOLN, Take 1 Bottle by mouth daily as needed for moderate constipation or severe constipation., Disp: , Rfl:  .  meloxicam (MOBIC) 15 MG tablet, Take 1 tablet (15 mg total) by mouth daily., Disp: 30 tablet, Rfl: 2 .  Milk Thistle 1000 MG CAPS, Take 1 capsule by mouth daily., Disp: , Rfl:  .  Multiple Vitamins-Minerals (WOMENS 50+ MULTI VITAMIN/MIN) TABS, Take 1 tablet by  mouth daily., Disp: , Rfl:  .  Oxycodone HCl 20 MG TABS, Take 1 tablet (20 mg total) by mouth every 4 (four) hours as needed., Disp: 120 tablet, Rfl: 0 .  pantoprazole (PROTONIX) 40 MG tablet, Take 1 tablet (40 mg total) 2 (two) times daily by mouth., Disp: 180 tablet, Rfl: 3 .  pravastatin (PRAVACHOL) 20 MG tablet, Take 20 mg by mouth daily. , Disp: , Rfl: 3 .  prochlorperazine (COMPAZINE) 10 MG tablet, Take 1 tablet (10 mg total) by mouth every 6 (six) hours as needed for nausea or vomiting., Disp: 30 tablet, Rfl: 1 .  PSYLLIUM HUSK PO, Take 500 mg by mouth 2 (two) times daily. , Disp: , Rfl:  .  TURMERIC CURCUMIN PO, Take 2,050 mg by mouth 2 (two) times daily., Disp: , Rfl:  No current facility-administered medications for this visit.   Facility-Administered Medications Ordered in Other Visits:  .  sodium chloride flush (NS) 0.9 % injection 10 mL, 10 mL, Intracatheter, PRN, Curt Bears, MD .  sodium chloride flush (NS) 0.9 % injection 10 mL, 10 mL, Intravenous, PRN, Curt Bears, MD, 10 mL at 09/21/17 1103  Allergies:  Allergies  Allergen Reactions  . Penicillins Anaphylaxis and Rash     Has patient had a PCN reaction causing immediate rash, facial/tongue/throat swelling, SOB or lightheadedness with hypotension: # # YES # # Has patient had a PCN reaction causing severe rash involving mucus membranes or skin necrosis: No Has patient had a PCN reaction that required hospitalization No Has patient had a PCN reaction occurring within the last 10 years: # # YES # #  If all of the above answers are "NO", then may proceed with Cephalosporin use.   . Levofloxacin Other (See Comments)    Muscle weakness/difficulty walking Muscle weakness/difficulty walking  . Varenicline Other (See Comments)    Nightmares/lack of sleep Nightmares/lack of sleep    Past Medical History, Surgical history, Social history, and Family History were reviewed and updated.  Review of Systems: Review of  Systems  Constitutional: Negative.   HENT:  Negative.   Eyes: Negative.   Respiratory: Positive for chest tightness.   Cardiovascular: Negative.   Gastrointestinal: Positive for abdominal pain.  Genitourinary: Negative.    Musculoskeletal: Positive for back pain and neck pain.  Skin: Negative.   Neurological: Negative.   Hematological: Negative.   Psychiatric/Behavioral: Negative.     Physical Exam:  weight is 150 lb (68 kg). Her oral temperature is 98.5 F (36.9 C). Her blood pressure is 92/63 and her pulse is 107 (abnormal). Her respiration is 18 and oxygen saturation is 98%.   Wt Readings from Last 3 Encounters:  02/20/18 150 lb (68 kg)  01/25/18 152 lb 1 oz (69 kg)  01/25/18 152 lb 1 oz (69 kg)    Physical Exam  Constitutional: She is oriented to person, place, and time.  HENT:  Head: Normocephalic and atraumatic.  Mouth/Throat: Oropharynx is clear and moist.  Eyes: Pupils are equal, round, and reactive to light. EOM are normal.  Neck: Normal range of motion.  Cardiovascular: Normal rate, regular rhythm and normal heart sounds.  Pulmonary/Chest: Effort normal and breath sounds normal.  Abdominal: Soft. Bowel sounds are normal.  Musculoskeletal: Normal range of motion. She exhibits no edema, tenderness or deformity.  Lymphadenopathy:    She has no cervical adenopathy.  Neurological: She is alert and oriented to person, place, and time.  Skin: Skin is warm and dry. No rash noted. No erythema.  Psychiatric: She has a normal mood and affect. Her behavior is normal. Judgment and thought content normal.  Vitals reviewed.    Lab Results  Component Value Date   WBC 3.6 (L) 02/20/2018   HGB 13.8 02/20/2018   HCT 42.3 02/20/2018   MCV 94.0 02/20/2018   PLT 171 02/20/2018     Chemistry      Component Value Date/Time   NA 131 02/20/2018 0806   NA 140 05/22/2017 1212   K 3.8 02/20/2018 0806   K 4.2 05/22/2017 1212   CL 105 02/20/2018 0806   CO2 29 02/20/2018 0806     CO2 21 (L) 05/22/2017 1212   BUN 8 02/20/2018 0806   BUN 10.3 05/22/2017 1212   CREATININE 0.70 02/20/2018 0806   CREATININE 0.8 05/22/2017 1212      Component Value Date/Time   CALCIUM 8.8 02/20/2018 0806   CALCIUM 9.6 05/22/2017 1212   ALKPHOS 283 (H) 02/20/2018 0806   ALKPHOS 113 05/22/2017 1212   AST 120 (H) 02/20/2018 0806   AST 17 05/22/2017 1212   ALT 89 (H) 02/20/2018 0806   ALT 27 05/22/2017 1212   BILITOT 0.8 02/20/2018 0806   BILITOT 0.31 05/22/2017 1212       Impression and Plan: Ms. Simoneau is a 52 year old white female with metastatic adenocarcinoma of the lung.   Again, we will get her started on systemic therapy.  I think we have to.  Hopefully, this may also help with her pain issues.  She just has issues been able to lie flat.  May be, after taking some treatment, she will be able to lie flat for additional scans and SRS to the brain.  I was with her for about 45 minutes or so.  I gave her information about the gemcitabine and vinorelbine combination.  We will get started in a week.  I did speak with Ashlyn of radiation oncology.  They are in agreement to start systemic chemotherapy and then see how she is doing prior to doing any SRS to the brain.  I will plan to get her back to see her in another few weeks.  Volanda Napoleon, MD 9/26/201910:50 AM

## 2018-02-23 ENCOUNTER — Telehealth: Payer: Self-pay | Admitting: Urology

## 2018-02-23 ENCOUNTER — Other Ambulatory Visit: Payer: Self-pay | Admitting: Urology

## 2018-02-23 ENCOUNTER — Telehealth: Payer: Self-pay | Admitting: Radiation Oncology

## 2018-02-23 MED ORDER — SUCRALFATE 1 G PO TABS: 1.0000 g | ORAL_TABLET | Freq: Three times a day (TID) | ORAL | 1 refills | Status: AC

## 2018-02-23 MED ORDER — MAGIC MOUTHWASH W/LIDOCAINE: 5.0000 mL | Freq: Four times a day (QID) | ORAL | 0 refills | Status: DC | PRN

## 2018-02-23 NOTE — Progress Notes (Signed)
As requested by Ashlyn Burning, PA-C this RN [facility] in [facility] [address]. Detailed script left on provider voicemail. Patient understands the script should be available for pick up in the next hour.

## 2018-02-23 NOTE — Telephone Encounter (Signed)
-----   Message from Freeman Caldron, [place] sent at 02/23/2018  2:34 PM EDT ----- Regarding: RE: Esophagitis Contact: [phone] Please elt her know that I [facility] [address] and we will call in a Rx for Magic Mouthwash.  I have entered the Rx for MM in Epic as a phine in Rx if you will please phone this in the [facility] [address].  Thank you!!! -Ashlyn ----- Message ----- From: Heywood Footman, RN Sent: 02/23/2018   1:45 PM EDT To: Freeman Caldron, PA-C Subject: Esophagitis                                    Caryl Pina.  Patient completed T and L spine treatment approximately two weeks ago. She phoned today complaining of esophagitis or pain associated with swallowing. She is requesting medication for relief. [facility]   Sam

## 2018-02-23 NOTE — Telephone Encounter (Signed)
Patient states that she is having trouble swallowing post radiation. Explained that I would give message to the nurse and she will reach out to her.

## 2018-02-25 IMAGING — CT CT MAXILLOFACIAL W/O CM
3 of 4 series · 15 of 47 positions shown, 18 images · non-contrast
Comparison: CT scan from [REDACTED] 12/23/2015. Also CT
maxillofacial limited 10/19/2015.

CLINICAL DATA: Recurring sinus problems, worse on the RIGHT.

EXAM:
CT MAXILLOFACIAL WITHOUT CONTRAST
TECHNIQUE: Multidetector CT imaging of the maxillofacial structures was
performed. Multiplanar CT image reconstructions were also generated.
A small metallic BB was placed on the right temple in order to
reliably differentiate right from left.

[Series 2: max soft · axial · 0.49mm/px · z∈[-195,-45]mm · 9 of 87 slices shown, 12 images]
[im 6/87  brain]
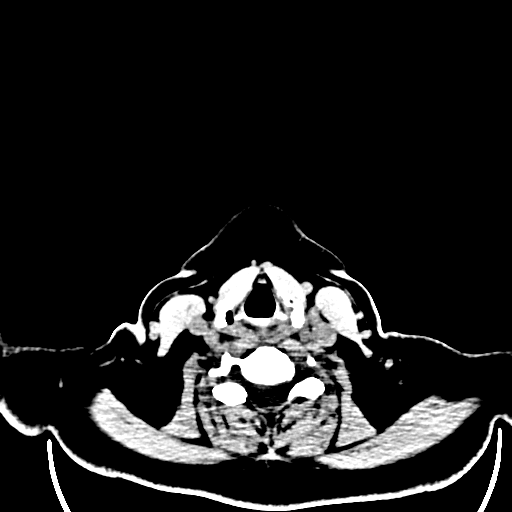
[im 6/87  bone]
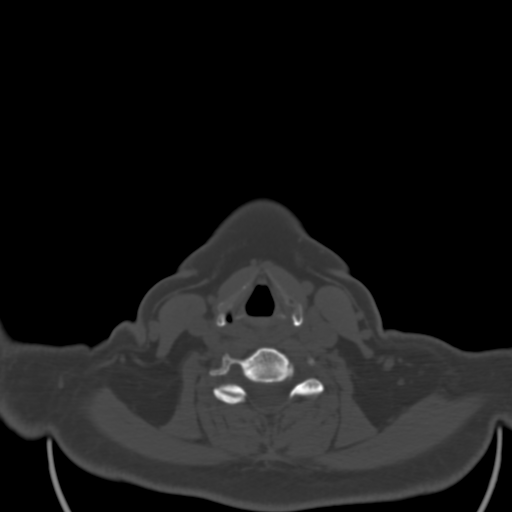
[im 15/87  bone]
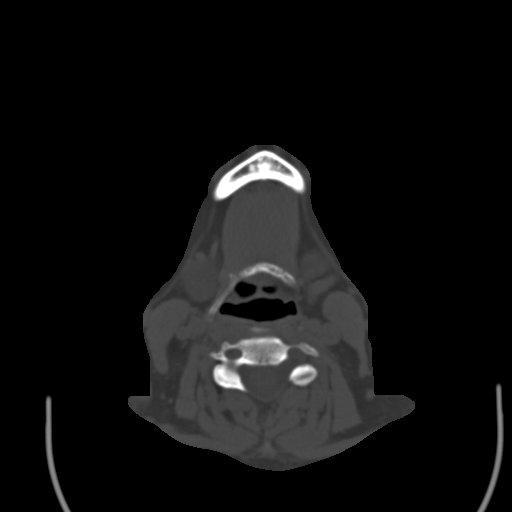
[im 24/87  bone]
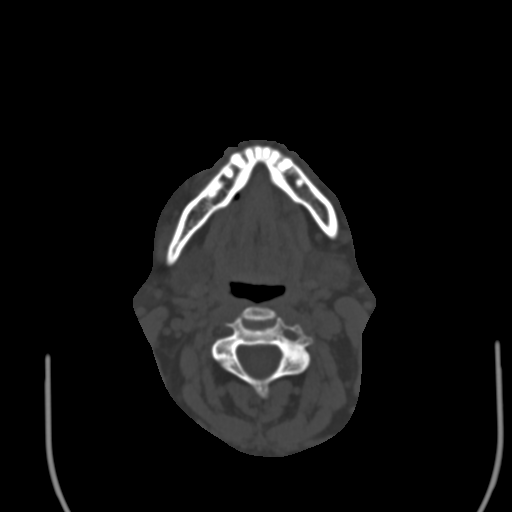
[im 33/87  bone]
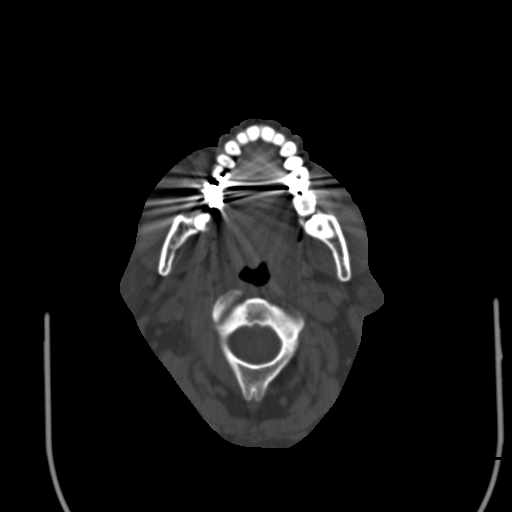
[im 45/87  brain]
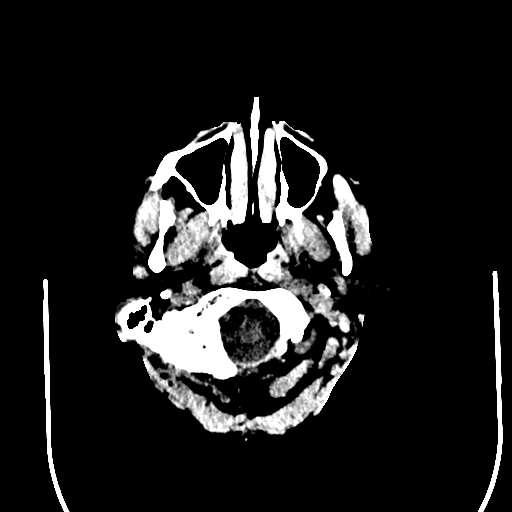
[im 45/87  bone]
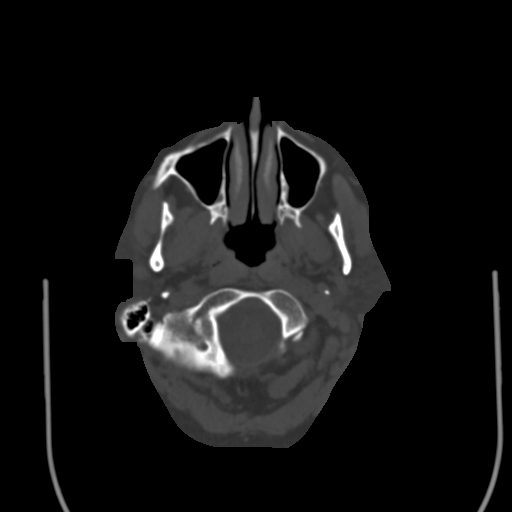
[im 54/87  bone]
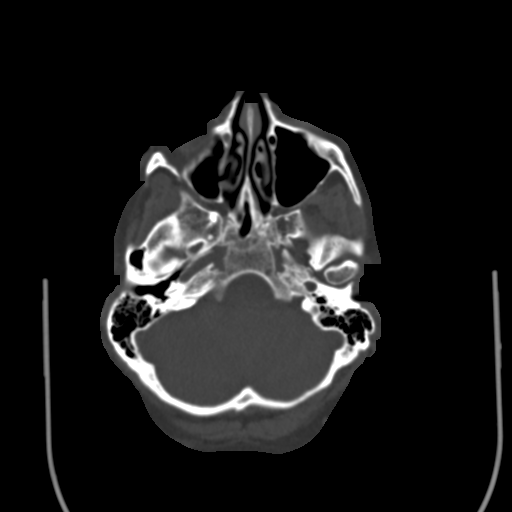
[im 63/87  bone]
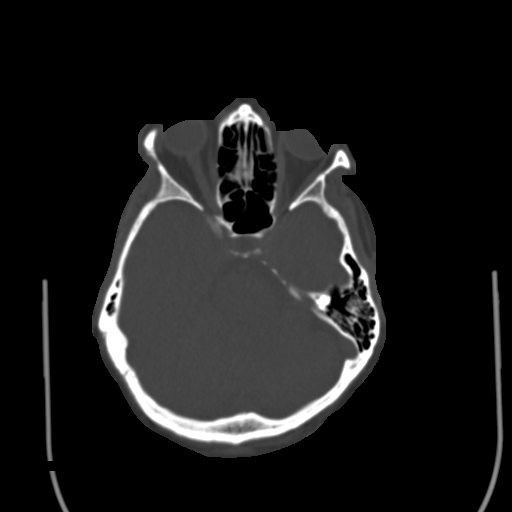
[im 72/87  bone]
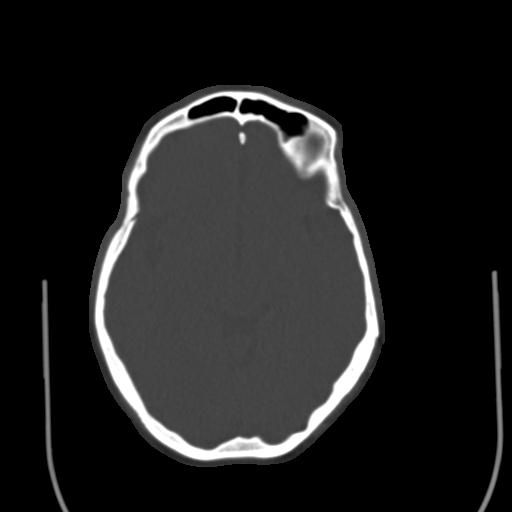
[im 81/87  brain]
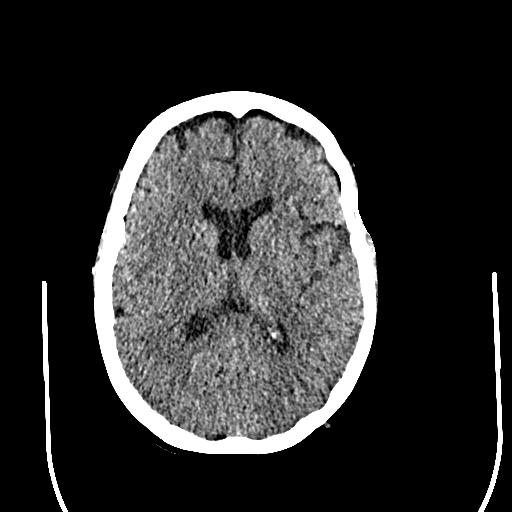
[im 81/87  bone]
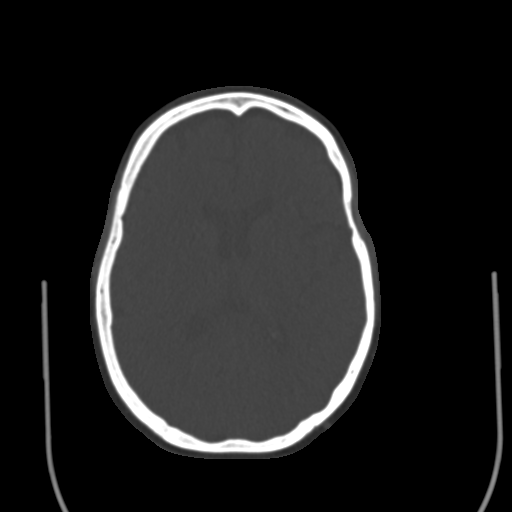

[Series 7: sagittal soft · sagittal · 0.40mm/px · 3 of 86 slices shown]
[im 29/86  bone]
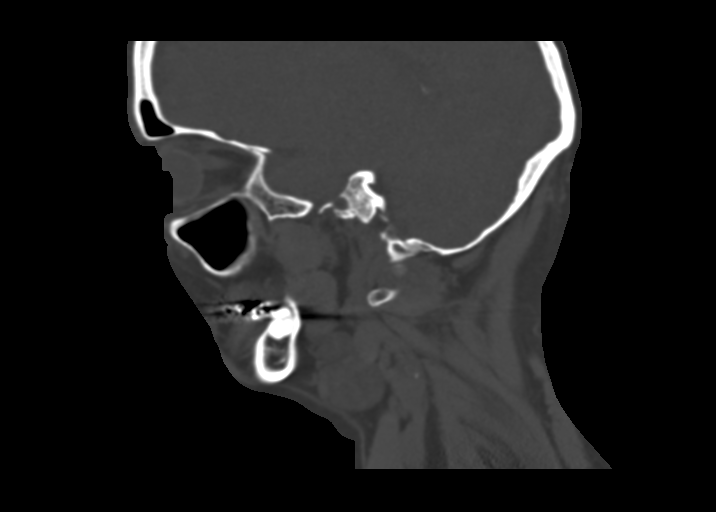
[im 43/86  bone]
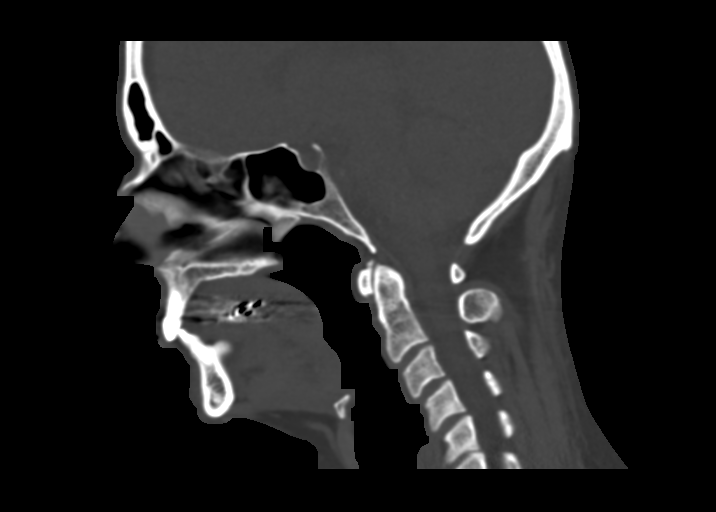
[im 57/86  bone]
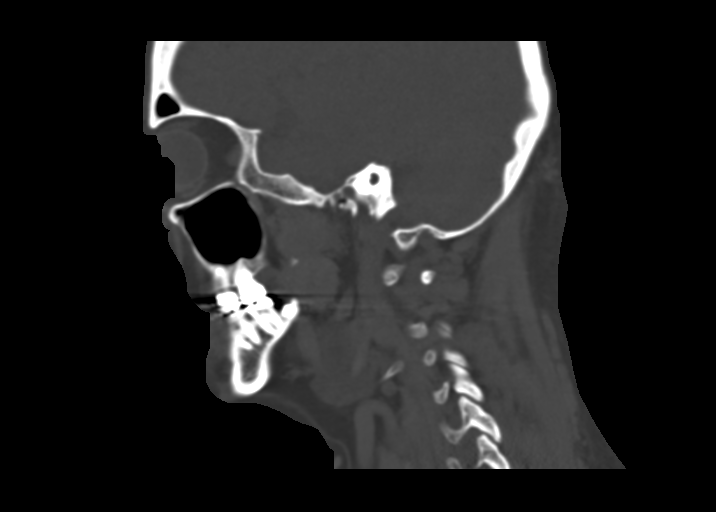

[Series 8: coronal bone · coronal · 0.43mm/px · 3 of 97 slices shown]
[im 25/97  bone]
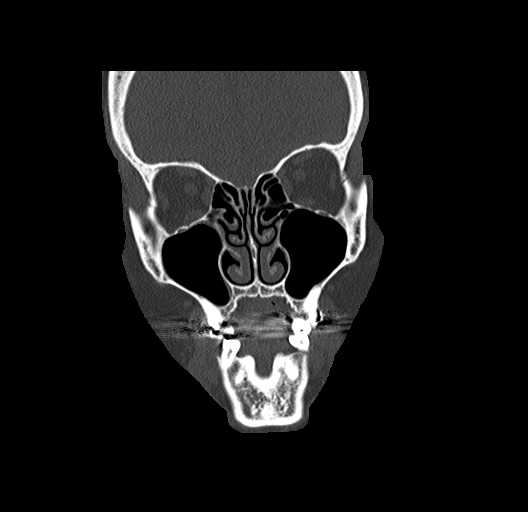
[im 49/97  bone]
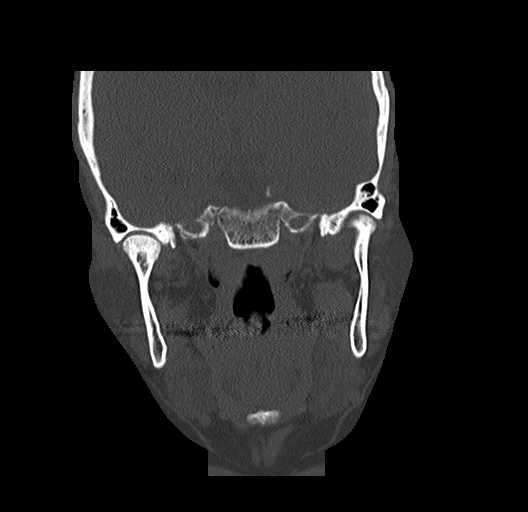
[im 73/97  bone]
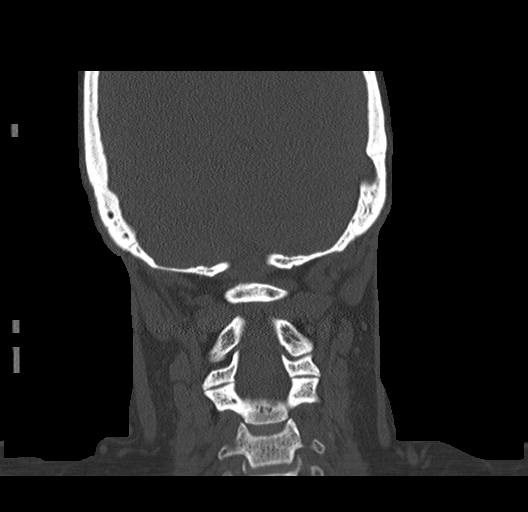

[15 of 47 positions shown; findings below may reference images not displayed]

FINDINGS: Osseous: No acute findings.  BILATERAL  mandibular [REDACTED].

Orbits: Negative. No traumatic or inflammatory finding.

Sinuses: Previously noted mucosal thickening, foamy secretions, and
layering fluid have all cleared. Nasal septum essentially midline.

Soft tissues: Negative.

Limited intracranial: No significant or unexpected finding.
IMPRESSION: Resolution of previously noted chronic and acute pansinusitis.

## 2018-02-26 ENCOUNTER — Other Ambulatory Visit: Payer: Self-pay | Admitting: Radiation Therapy

## 2018-02-26 ENCOUNTER — Ambulatory Visit (HOSPITAL_BASED_OUTPATIENT_CLINIC_OR_DEPARTMENT_OTHER)
Admission: RE | Admit: 2018-02-26 | Discharge: 2018-02-26 | Disposition: A | Discharge status: Home / Self Care | Payer: BLUE CROSS/BLUE SHIELD | Source: Ambulatory Visit | Attending: Hematology & Oncology | Admitting: Hematology & Oncology

## 2018-02-26 ENCOUNTER — Encounter: Payer: Self-pay | Admitting: Radiation Oncology

## 2018-02-26 ENCOUNTER — Telehealth: Payer: Self-pay | Admitting: *Deleted

## 2018-02-26 DIAGNOSIS — C3492 Malignant neoplasm of unspecified part of left bronchus or lung: Secondary | ICD-10-CM | POA: Insufficient documentation

## 2018-02-26 DIAGNOSIS — C7931 Secondary malignant neoplasm of brain: Secondary | ICD-10-CM

## 2018-02-26 DIAGNOSIS — C7949 Secondary malignant neoplasm of other parts of nervous system: Principal | ICD-10-CM

## 2018-02-26 NOTE — Progress Notes (Signed)
Mri

## 2018-02-26 NOTE — Telephone Encounter (Signed)
Message received from patient stating that her PCP is unable to see her today and would like to see Dr. Marin Olp tomorrow.  Call placed back to patient with no answer.

## 2018-02-26 NOTE — Progress Notes (Signed)
  Radiation Oncology         [phone]) [phone] ________________________________  Name: Debbie Baker MRN: [id number]  Date: 02/26/2018  DOB: [birth year]  End of Treatment Note  Diagnosis:   52 y.o. female patient with thoracic and lumbar metastasis     Indication for treatment:  Palliative       Radiation treatment dates:   01/22/18 - 02/19/18  Site/dose:  The patient received 30 Gy in 20 fractions of 3 Gy to each of the listed spinal lesions: 1. Spine, Lumbar L1/ total of 30 Gy in 10 fractions of 3 Gy 2. Spine, Thoracic T3-T7/ total of 30 Gy in 10 fractions of 3 Gy  Beams/energy:    1. 3D, Photon/ 15X 2. IMRT/ 6X  Narrative: The patient tolerated radiation treatment relatively well.  She reported leg weakness/unsteadiness during treatment as well as pain that radiated from her back to her sternum that resolved after the first half of treatment. She denied paresthesias in BUE and BLE, diarrhea, and any skin issues throughout treatment and reported improving pain, constipation, and fatigue as treatment progressed. She did experience difficulty swallowing and loss of appetite during treatment of the T-spine.  Plan: The patient has completed radiation treatment. The patient will return to radiation oncology clinic for routine followup in one month. I advised her to call or return sooner if she has any questions or concerns related to her recovery or treatment. ________________________________  Sheral Apley. Tammi Klippel, M.D.   This document serves as a record of services personally performed by Tyler Pita, MD. It was created on his behalf by Wilburn Mylar, a trained medical scribe. The creation of this record is based on the scribe's personal observations and the provider's statements to them. This document has been checked and approved by the attending provider.

## 2018-02-26 NOTE — Telephone Encounter (Signed)
Message received from patient stating that she is having wheezing and SOB and would like to know if she should see Dr. Marin Olp or her PCP.  Dr. Marin Olp notified and order received for pt to see her PCP.  Call placed back to pt and pt instructed to see her PCP per order of Dr. Marin Olp.

## 2018-02-27 ENCOUNTER — Inpatient Hospital Stay: Payer: BLUE CROSS/BLUE SHIELD | Attending: Internal Medicine | Admitting: Hematology & Oncology

## 2018-02-27 ENCOUNTER — Ambulatory Visit (HOSPITAL_BASED_OUTPATIENT_CLINIC_OR_DEPARTMENT_OTHER)
Admission: RE | Admit: 2018-02-27 | Discharge: 2018-02-27 | Disposition: A | Discharge status: Home / Self Care | Payer: BLUE CROSS/BLUE SHIELD | Source: Ambulatory Visit | Attending: Hematology & Oncology | Admitting: Hematology & Oncology

## 2018-02-27 ENCOUNTER — Telehealth: Payer: Self-pay | Admitting: *Deleted

## 2018-02-27 ENCOUNTER — Other Ambulatory Visit: Payer: Self-pay

## 2018-02-27 ENCOUNTER — Encounter: Payer: Self-pay | Admitting: Hematology & Oncology

## 2018-02-27 VITALS — BP 116/85 | HR 115 | Temp 97.9°F | Resp 21 | Wt 151.0 lb

## 2018-02-27 DIAGNOSIS — Z5111 Encounter for antineoplastic chemotherapy: Secondary | ICD-10-CM | POA: Diagnosis not present

## 2018-02-27 DIAGNOSIS — C7931 Secondary malignant neoplasm of brain: Secondary | ICD-10-CM | POA: Diagnosis not present

## 2018-02-27 DIAGNOSIS — M542 Cervicalgia: Secondary | ICD-10-CM

## 2018-02-27 DIAGNOSIS — G893 Neoplasm related pain (acute) (chronic): Secondary | ICD-10-CM | POA: Diagnosis not present

## 2018-02-27 DIAGNOSIS — Z66 Do not resuscitate: Secondary | ICD-10-CM | POA: Insufficient documentation

## 2018-02-27 DIAGNOSIS — C3412 Malignant neoplasm of upper lobe, left bronchus or lung: Secondary | ICD-10-CM | POA: Insufficient documentation

## 2018-02-27 DIAGNOSIS — Z792 Long term (current) use of antibiotics: Secondary | ICD-10-CM | POA: Diagnosis not present

## 2018-02-27 DIAGNOSIS — C3492 Malignant neoplasm of unspecified part of left bronchus or lung: Secondary | ICD-10-CM

## 2018-02-27 DIAGNOSIS — K219 Gastro-esophageal reflux disease without esophagitis: Secondary | ICD-10-CM | POA: Diagnosis not present

## 2018-02-27 DIAGNOSIS — R918 Other nonspecific abnormal finding of lung field: Secondary | ICD-10-CM | POA: Insufficient documentation

## 2018-02-27 DIAGNOSIS — J90 Pleural effusion, not elsewhere classified: Secondary | ICD-10-CM | POA: Diagnosis not present

## 2018-02-27 DIAGNOSIS — Z923 Personal history of irradiation: Secondary | ICD-10-CM | POA: Diagnosis not present

## 2018-02-27 DIAGNOSIS — K769 Liver disease, unspecified: Secondary | ICD-10-CM

## 2018-02-27 DIAGNOSIS — M549 Dorsalgia, unspecified: Secondary | ICD-10-CM | POA: Diagnosis not present

## 2018-02-27 DIAGNOSIS — R110 Nausea: Secondary | ICD-10-CM | POA: Insufficient documentation

## 2018-02-27 DIAGNOSIS — R109 Unspecified abdominal pain: Secondary | ICD-10-CM

## 2018-02-27 DIAGNOSIS — R0789 Other chest pain: Secondary | ICD-10-CM | POA: Diagnosis not present

## 2018-02-27 DIAGNOSIS — C7951 Secondary malignant neoplasm of bone: Secondary | ICD-10-CM | POA: Diagnosis not present

## 2018-02-27 DIAGNOSIS — R05 Cough: Secondary | ICD-10-CM

## 2018-02-27 DIAGNOSIS — R0602 Shortness of breath: Secondary | ICD-10-CM | POA: Insufficient documentation

## 2018-02-27 DIAGNOSIS — Z79899 Other long term (current) drug therapy: Secondary | ICD-10-CM

## 2018-02-27 MED ORDER — CLARITHROMYCIN 250 MG PO TABS: 250.0000 mg | ORAL_TABLET | Freq: Two times a day (BID) | ORAL | 0 refills | Status: DC

## 2018-02-27 MED ORDER — FENTANYL 50 MCG/HR TD PT72: 50.0000 ug | MEDICATED_PATCH | TRANSDERMAL | 0 refills | Status: DC

## 2018-02-27 NOTE — Telephone Encounter (Addendum)
-----   Message from Volanda Napoleon, MD sent at 02/27/2018  9:45 AM EDT ----- Called patient.   No obvious pneumonia. Maybe some bronchitis per Dr. Marin Olp

## 2018-02-27 NOTE — Progress Notes (Signed)
Hematology and Oncology Follow Up Visit  Debbie Baker [id number] 1966-05-22 52 y.o. 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  Apparently, she been having some issues with shortness of breath.  She called yesterday.  We try to get her to see her family doctor.  She did not do this.  She just came in today.  She says that she is coughing up some yellow mucus.  There is no blood.  And she is had no fever.  She has had a is hard for her to lie down flat.  Of note, we did do a sonogram of her abdomen earlier this week.  This showed 2 liver lesions.  There is slightly larger in size.  There is still less than 2 cm.  There are no new lesions.  She says that the radiation did not help her pain at all.  She is still having quite a bit of pain.  We will increase the fentanyl patch up to 150 mcg.  I told her that it would not make sense to keep increasing the short-term pain medication.  She is not eating as much as I would like.  Much sure she is taking the Marinol.  She has had no leg swelling.  There is been no problems with diarrhea.  We did do a pulse ox on her in the office.  Her pulse ox at rest was 98%.  We walked around the office a couple times.  Her pulse ox was 95%.  Currently, her performance status is ECOG 1-2.     Medications:  Current Outpatient Medications:  .  albuterol (PROVENTIL HFA;VENTOLIN HFA) 108 (90 Base) MCG/ACT inhaler, Inhale 2 puffs into the lungs every 6 (six) hours as needed for wheezing or shortness of breath., Disp: 2 Inhaler, Rfl: 3 .  antiseptic oral rinse (BIOTENE) LIQD, 15 mLs by Mouth Rinse route as needed for dry  mouth., Disp: 473 mL, Rfl: 3 .  benzonatate (TESSALON) 100 MG capsule, Take 1 capsule (100 mg total) by mouth every 4 (four) hours as needed for cough., Disp: 60 capsule, Rfl: 1 .  Black Elderberry (SAMBUCUS ELDERBERRY PO), Take 50 mg by mouth daily., Disp: , Rfl:  .  Calcium-Magnesium-Vitamin D 185-50-100 MG-MG-UNIT CAPS, Take 1 tablet by mouth every evening., Disp: , Rfl:  .  cetirizine (ZYRTEC) 10 MG tablet, Take 10 mg by mouth daily., Disp: , Rfl:  .  clarithromycin (BIAXIN) 250 MG tablet, Take 1 tablet (250 mg total) by mouth 2 (two) times daily., Disp: 14 tablet, Rfl: 0 .  diphenhydrAMINE (BENADRYL) 25 mg capsule, Take 25 mg by mouth at bedtime as needed., Disp: , Rfl:  .  dronabinol (MARINOL) 5 MG capsule, Take 1 capsule (5 mg total) by mouth 2 (two) times daily before lunch and supper., Disp: 60 capsule, Rfl: 0 .  fentaNYL (DURAGESIC - DOSED MCG/HR) 100 MCG/HR, Place 1 patch (100 mcg total) onto the skin every other day., Disp: 15 patch, Rfl: 0 .  fentaNYL (DURAGESIC - DOSED MCG/HR) 50 MCG/HR, Place 1 patch (50 mcg total) onto the skin every other day., Disp: 15 patch, Rfl: 0 .  folic acid (FOLVITE) 1 MG tablet, Take 1 tablet (1 mg total) by mouth daily., Disp: 30 tablet, Rfl: 4 .  Lactobacillus (PROBIOTIC ACIDOPHILUS) CAPS, Take 1 capsule by mouth daily. , Disp: , Rfl:  .  lidocaine-prilocaine (EMLA) cream, Apply 1 application topically as needed., Disp: 30 g, Rfl: 2 .  LORazepam (ATIVAN) 0.5 MG tablet, Take 1 tablet (0.5 mg total) by mouth as needed for anxiety (take 30 min prior to MRI scans and treatments)., Disp: 30 tablet, Rfl: 0 .  magic mouthwash SOLN, Take 5 mLs by mouth 4 (four) times daily -  with meals and at bedtime. Called in 08/05/17 by on call RN, Components benadryl  525 mg, hydrocortisone 60 mg and nystatin 0.6 mg. 240 ml, Disp: 240 mL, Rfl: 0 .  magic mouthwash w/lidocaine SOLN, Take 5 mLs by mouth 4 (four) times daily as needed (pain with swallowing)., Disp: 500 mL, Rfl: 0 .   magnesium citrate SOLN, Take 1 Bottle by mouth daily as needed for moderate constipation or severe constipation., Disp: , Rfl:  .  meloxicam (MOBIC) 15 MG tablet, Take 1 tablet (15 mg total) by mouth daily., Disp: 30 tablet, Rfl: 2 .  Milk Thistle 1000 MG CAPS, Take 1 capsule by mouth daily., Disp: , Rfl:  .  Multiple Vitamins-Minerals (WOMENS 50+ MULTI VITAMIN/MIN) TABS, Take 1 tablet by mouth daily., Disp: , Rfl:  .  Oxycodone HCl 20 MG TABS, Take 1 tablet (20 mg total) by mouth every 4 (four) hours as needed., Disp: 120 tablet, Rfl: 0 .  pantoprazole (PROTONIX) 40 MG tablet, Take 1 tablet (40 mg total) 2 (two) times daily by mouth., Disp: 180 tablet, Rfl: 3 .  pravastatin (PRAVACHOL) 20 MG tablet, Take 20 mg by mouth daily. , Disp: , Rfl: 3 .  prochlorperazine (COMPAZINE) 10 MG tablet, Take 1 tablet (10 mg total) by mouth every 6 (six) hours as needed for nausea or vomiting., Disp: 30 tablet, Rfl: 1 .  PSYLLIUM HUSK PO, Take 500 mg by mouth 2 (two) times daily. , Disp: , Rfl:  .  sucralfate (CARAFATE) 1 g tablet, Take 1 tablet (1 g total) by mouth 4 (four) times daily -  with meals and at bedtime. Crush and dissolve in 8 oz water, Disp: 90 tablet, Rfl: 1 .  TURMERIC CURCUMIN PO, Take 2,050 mg by mouth 2 (two) times daily., Disp: , Rfl:  No current facility-administered medications for this visit.   Facility-Administered Medications Ordered in Other Visits:  .  sodium chloride flush (NS) 0.9 % injection 10 mL, 10 mL, Intracatheter, PRN, Curt Bears, MD .  sodium chloride flush (NS) 0.9 % injection 10 mL, 10 mL, Intravenous, PRN, Curt Bears, MD, 10 mL at 09/21/17 1103  Allergies:  Allergies  Allergen Reactions  . Penicillins Anaphylaxis and Rash     Has patient had a PCN reaction causing immediate rash, facial/tongue/throat swelling, SOB or lightheadedness with hypotension: # # YES # # Has patient had a PCN reaction causing severe rash involving mucus membranes or skin necrosis:  No Has patient had a PCN reaction that required hospitalization No Has patient had a PCN reaction occurring within the last 10 years: # # YES # #  If all of the above answers are "NO", then may proceed with Cephalosporin use.   . Levofloxacin Other (See Comments)    Muscle weakness/difficulty walking Muscle weakness/difficulty walking  . Varenicline Other (See Comments)    Nightmares/lack of sleep Nightmares/lack of sleep  Past Medical History, Surgical history, Social history, and Family History were reviewed and updated.  Review of Systems: Review of Systems  Constitutional: Negative.   HENT:  Negative.   Eyes: Negative.   Respiratory: Positive for chest tightness.   Cardiovascular: Negative.   Gastrointestinal: Positive for abdominal pain.  Genitourinary: Negative.    Musculoskeletal: Positive for back pain and neck pain.  Skin: Negative.   Neurological: Negative.   Hematological: Negative.   Psychiatric/Behavioral: Negative.     Physical Exam:  weight is 151 lb (68.5 kg). Her oral temperature is 97.9 F (36.6 C). Her blood pressure is 116/85 and her pulse is 115 (abnormal). Her respiration is 21 (abnormal) and oxygen saturation is 98%.   Wt Readings from Last 3 Encounters:  02/27/18 151 lb (68.5 kg)  02/20/18 150 lb (68 kg)  01/25/18 152 lb 1 oz (69 kg)    Physical Exam  Constitutional: She is oriented to person, place, and time.  HENT:  Head: Normocephalic and atraumatic.  Mouth/Throat: Oropharynx is clear and moist.  Eyes: Pupils are equal, round, and reactive to light. EOM are normal.  Neck: Normal range of motion.  Cardiovascular: Normal rate, regular rhythm and normal heart sounds.  Pulmonary/Chest: Effort normal and breath sounds normal.  Abdominal: Soft. Bowel sounds are normal.  Musculoskeletal: Normal range of motion. She exhibits no edema, tenderness or deformity.  Lymphadenopathy:    She has no cervical adenopathy.  Neurological: She is alert and  oriented to person, place, and time.  Skin: Skin is warm and dry. No rash noted. No erythema.  Psychiatric: She has a normal mood and affect. Her behavior is normal. Judgment and thought content normal.  Vitals reviewed.    Lab Results  Component Value Date   WBC 3.6 (L) 02/20/2018   HGB 13.8 02/20/2018   HCT 42.3 02/20/2018   MCV 94.0 02/20/2018   PLT 171 02/20/2018     Chemistry      Component Value Date/Time   NA 131 02/20/2018 0806   NA 140 05/22/2017 1212   K 3.8 02/20/2018 0806   K 4.2 05/22/2017 1212   CL 105 02/20/2018 0806   CO2 29 02/20/2018 0806   CO2 21 (L) 05/22/2017 1212   BUN 8 02/20/2018 0806   BUN 10.3 05/22/2017 1212   CREATININE 0.70 02/20/2018 0806   CREATININE 0.8 05/22/2017 1212      Component Value Date/Time   CALCIUM 8.8 02/20/2018 0806   CALCIUM 9.6 05/22/2017 1212   ALKPHOS 283 (H) 02/20/2018 0806   ALKPHOS 113 05/22/2017 1212   AST 120 (H) 02/20/2018 0806   AST 17 05/22/2017 1212   ALT 89 (H) 02/20/2018 0806   ALT 27 05/22/2017 1212   BILITOT 0.8 02/20/2018 0806   BILITOT 0.31 05/22/2017 1212       Impression and Plan: Debbie Baker is a 52 year old white female with metastatic adenocarcinoma of the lung.   Hopefully, the antibiotic that I will call in-clarithromycin-will help.  The problem is that she cannot lie down for a CT scan.  We probably should consider a CT angiogram for her if her symptoms do not get better.  She is incredibly reluctant to do this because of having to lie down on the table for the CT scan.  She just does not feel she can do this for even 10 minutes.    I would hope that the radiation that she took might of helped her.  For right now, there does not seem  to be any benefit that she is obtained from the radiation.  She is coming back in the office in 2 days.  We will see how she is doing then.  She is post come in for the start of her systemic chemotherapy.  Somehow, I just do not think she will ever get a lot of  pain relief no matter what is done.  Again she just has bad bony disease.  May be, just maybe that the radiation will help a little bit.   Volanda Napoleon, MD 10/1/20199:46 AM

## 2018-03-01 ENCOUNTER — Ambulatory Visit: Payer: BLUE CROSS/BLUE SHIELD

## 2018-03-01 ENCOUNTER — Other Ambulatory Visit: Payer: BLUE CROSS/BLUE SHIELD

## 2018-03-01 ENCOUNTER — Inpatient Hospital Stay: Payer: BLUE CROSS/BLUE SHIELD

## 2018-03-01 ENCOUNTER — Encounter: Payer: Self-pay | Admitting: *Deleted

## 2018-03-01 VITALS — BP 131/88 | HR 94

## 2018-03-01 DIAGNOSIS — C787 Secondary malignant neoplasm of liver and intrahepatic bile duct: Principal | ICD-10-CM

## 2018-03-01 DIAGNOSIS — C3412 Malignant neoplasm of upper lobe, left bronchus or lung: Secondary | ICD-10-CM | POA: Diagnosis not present

## 2018-03-01 DIAGNOSIS — C3492 Malignant neoplasm of unspecified part of left bronchus or lung: Secondary | ICD-10-CM

## 2018-03-01 DIAGNOSIS — F419 Anxiety disorder, unspecified: Secondary | ICD-10-CM

## 2018-03-01 LAB — COMPREHENSIVE METABOLIC PANEL
ALBUMIN: 3.5 g/dL (ref 3.5–5.0)
ALK PHOS: 390 U/L — AB (ref 26–84)
ALT: 74 U/L — ABNORMAL HIGH (ref 10–47)
AST: 78 U/L — ABNORMAL HIGH (ref 11–38)
Anion gap: 2 — ABNORMAL LOW (ref 5–15)
BILIRUBIN TOTAL: 1.1 mg/dL (ref 0.2–1.6)
BUN: 7 mg/dL (ref 7–22)
CHLORIDE: 100 mmol/L (ref 98–108)
CO2: 29 mmol/L (ref 18–33)
Calcium: 8.8 mg/dL (ref 8.0–10.3)
Creatinine, Ser: 0.6 mg/dL (ref 0.60–1.20)
Glucose, Bld: 122 mg/dL — ABNORMAL HIGH (ref 73–118)
POTASSIUM: 3.8 mmol/L (ref 3.3–4.7)
SODIUM: 131 mmol/L (ref 128–145)
Total Protein: 6.8 g/dL (ref 6.4–8.1)

## 2018-03-01 LAB — CBC WITH DIFFERENTIAL/PLATELET
BASOS PCT: 0 %
Basophils Absolute: 0 10*3/uL (ref 0.0–0.1)
EOS ABS: 0.1 10*3/uL (ref 0.0–0.5)
Eosinophils Relative: 2 %
HCT: 41.9 % (ref 34.8–46.6)
HEMOGLOBIN: 13.6 g/dL (ref 11.6–15.9)
LYMPHS ABS: 0.4 10*3/uL — AB (ref 0.9–3.3)
Lymphocytes Relative: 9 %
MCH: 30.2 pg (ref 26.0–34.0)
MCHC: 32.5 g/dL (ref 32.0–36.0)
MCV: 93.1 fL (ref 81.0–101.0)
MONO ABS: 0.8 10*3/uL (ref 0.1–0.9)
MONOS PCT: 16 %
NEUTROS PCT: 73 %
Neutro Abs: 3.7 10*3/uL (ref 1.5–6.5)
Platelets: 161 10*3/uL (ref 145–400)
RBC: 4.5 MIL/uL (ref 3.70–5.32)
RDW: 14.6 % (ref 11.1–15.7)
WBC: 5 10*3/uL (ref 3.9–10.0)

## 2018-03-01 MED ORDER — LORAZEPAM 0.5 MG PO TABS
0.5000 mg | ORAL_TABLET | Freq: Once | ORAL | Status: AC
  Administered 2018-03-01: 0.5 mg via ORAL

## 2018-03-01 MED ORDER — SODIUM CHLORIDE 0.9 % IV SOLN
1400.0000 mg | Freq: Once | INTRAVENOUS | Status: AC
  Administered 2018-03-01: 1400 mg via INTRAVENOUS
  Filled 2018-03-01: qty 10.52

## 2018-03-01 MED ORDER — LORAZEPAM 0.5 MG PO TABS
ORAL_TABLET | ORAL | Status: AC
  Filled 2018-03-01: qty 1

## 2018-03-01 MED ORDER — SODIUM CHLORIDE 0.9% FLUSH
10.0000 mL | INTRAVENOUS | Status: DC | PRN
  Administered 2018-03-01: 10 mL
  Filled 2018-03-01: qty 10

## 2018-03-01 MED ORDER — HEPARIN SOD (PORK) LOCK FLUSH 100 UNIT/ML IV SOLN
500.0000 [IU] | Freq: Once | INTRAVENOUS | Status: AC | PRN
  Administered 2018-03-01: 500 [IU]
  Filled 2018-03-01: qty 5

## 2018-03-01 MED ORDER — HYDROMORPHONE HCL 2 MG/ML IJ SOLN
INTRAMUSCULAR | Status: AC
  Filled 2018-03-01: qty 1

## 2018-03-01 MED ORDER — PROCHLORPERAZINE MALEATE 10 MG PO TABS
ORAL_TABLET | ORAL | Status: AC
  Filled 2018-03-01: qty 1

## 2018-03-01 MED ORDER — PROCHLORPERAZINE MALEATE 10 MG PO TABS
10.0000 mg | ORAL_TABLET | Freq: Once | ORAL | Status: AC
  Administered 2018-03-01: 10 mg via ORAL

## 2018-03-01 MED ORDER — VINORELBINE TARTRATE CHEMO INJECTION 50 MG/5ML
50.0000 mg | Freq: Once | INTRAVENOUS | Status: AC
  Administered 2018-03-01: 50 mg via INTRAVENOUS
  Filled 2018-03-01: qty 5

## 2018-03-01 MED ORDER — METHYLPREDNISOLONE SODIUM SUCC 125 MG IJ SOLR
125.0000 mg | Freq: Once | INTRAMUSCULAR | Status: AC
  Administered 2018-03-01: 125 mg via INTRAVENOUS

## 2018-03-01 MED ORDER — SODIUM CHLORIDE 0.9 % IV SOLN
Freq: Once | INTRAVENOUS | Status: AC
  Administered 2018-03-01: 11:00:00 via INTRAVENOUS
  Filled 2018-03-01: qty 250

## 2018-03-01 MED ORDER — HYDROMORPHONE HCL 1 MG/ML IJ SOLN
2.0000 mg | Freq: Once | INTRAMUSCULAR | Status: AC
  Administered 2018-03-01: 2 mg via INTRAVENOUS

## 2018-03-01 NOTE — Patient Instructions (Addendum)
Gemcitabine injection What is this medicine? GEMCITABINE (jem SIT a been) is a chemotherapy drug. This medicine is used to treat many types of cancer like breast cancer, lung cancer, pancreatic cancer, and ovarian cancer. This medicine may be used for other purposes; ask your health care provider or pharmacist if you have questions. COMMON BRAND NAME(S): Gemzar What should I tell my health care provider before I take this medicine? They need to know if you have any of these conditions: -blood disorders -infection -kidney disease -liver disease -recent or ongoing radiation therapy -an unusual or allergic reaction to gemcitabine, other chemotherapy, other medicines, foods, dyes, or preservatives -pregnant or trying to get pregnant -breast-feeding How should I use this medicine? This drug is given as an infusion into a vein. It is administered in a hospital or clinic by a specially trained health care professional. Talk to your pediatrician regarding the use of this medicine in children. Special care may be needed. Overdosage: If you think you have taken too much of this medicine contact a poison control center or emergency room at once. NOTE: This medicine is only for you. Do not share this medicine with others. What if I miss a dose? It is important not to miss your dose. Call your doctor or health care professional if you are unable to keep an appointment. What may interact with this medicine? -medicines to increase blood counts like filgrastim, pegfilgrastim, sargramostim -some other chemotherapy drugs like cisplatin -vaccines Talk to your doctor or health care professional before taking any of these medicines: -acetaminophen -aspirin -ibuprofen -ketoprofen -naproxen This list may not describe all possible interactions. Give your health care provider a list of all the medicines, herbs, non-prescription drugs, or dietary supplements you use. Also tell them if you smoke, drink alcohol,  or use illegal drugs. Some items may interact with your medicine. What should I watch for while using this medicine? Visit your doctor for checks on your progress. This drug may make you feel generally unwell. This is not uncommon, as chemotherapy can affect healthy cells as well as cancer cells. Report any side effects. Continue your course of treatment even though you feel ill unless your doctor tells you to stop. In some cases, you may be given additional medicines to help with side effects. Follow all directions for their use. Call your doctor or health care professional for advice if you get a fever, chills or sore throat, or other symptoms of a cold or flu. Do not treat yourself. This drug decreases your body's ability to fight infections. Try to avoid being around people who are sick. This medicine may increase your risk to bruise or bleed. Call your doctor or health care professional if you notice any unusual bleeding. Be careful brushing and flossing your teeth or using a toothpick because you may get an infection or bleed more easily. If you have any dental work done, tell your dentist you are receiving this medicine. Avoid taking products that contain aspirin, acetaminophen, ibuprofen, naproxen, or ketoprofen unless instructed by your doctor. These medicines may hide a fever. Women should inform their doctor if they wish to become pregnant or think they might be pregnant. There is a potential for serious side effects to an unborn child. Talk to your health care professional or pharmacist for more information. Do not breast-feed an infant while taking this medicine. What side effects may I notice from receiving this medicine? Side effects that you should report to your doctor or health care professional as   soon as possible: -allergic reactions like skin rash, itching or hives, swelling of the face, lips, or tongue -low blood counts - this medicine may decrease the number of white blood cells,  red blood cells and platelets. You may be at increased risk for infections and bleeding. -signs of infection - fever or chills, cough, sore throat, pain or difficulty passing urine -signs of decreased platelets or bleeding - bruising, pinpoint red spots on the skin, black, tarry stools, blood in the urine -signs of decreased red blood cells - unusually weak or tired, fainting spells, lightheadedness -breathing problems -chest pain -mouth sores -nausea and vomiting -pain, swelling, redness at site where injected -pain, tingling, numbness in the hands or feet -stomach pain -swelling of ankles, feet, hands -unusual bleeding Side effects that usually do not require medical attention (report to your doctor or health care professional if they continue or are bothersome): -constipation -diarrhea -hair loss -loss of appetite -stomach upset This list may not describe all possible side effects. Call your doctor for medical advice about side effects. You may report side effects to FDA at 1-800-FDA-1088. Where should I keep my medicine? This drug is given in a hospital or clinic and will not be stored at home. NOTE: This sheet is a summary. It may not cover all possible information. If you have questions about this medicine, talk to your doctor, pharmacist, or health care provider.  2018 Elsevier/Gold Standard (2007-09-25 18:45:54) Vinorelbine injection What is this medicine? VINORELBINE (vi NOR el been) is a chemotherapy drug. It targets fast dividing cells, like cancer cells, and causes these cells to die. This medicine is used to treat cancer, like lung cancer. This medicine may be used for other purposes; ask your health care provider or pharmacist if you have questions. COMMON BRAND NAME(S): Navelbine What should I tell my health care provider before I take this medicine? They need to know if you have any of these conditions: -blood disorders -infection (especially chickenpox and  herpes) -liver disease -lung disease -nervous system disease -previous or current radiation therapy -an unusual or allergic reaction to vinorelbine, other chemotherapy agents, other medicines, foods, dyes, or preservatives -pregnant or trying to get pregnant -breast-feeding How should I use this medicine? This drug is given as an infusion into a vein. It is administered in a hospital or clinic by a specially trained health care professional. If you have pain, swelling, burning or any unusual feeling around the site of your injection, tell your health care professional right away. Talk to your pediatrician regarding the use of this medicine in children. Special care may be needed. Overdosage: If you think you have taken too much of this medicine contact a poison control center or emergency room at once. NOTE: This medicine is only for you. Do not share this medicine with others. What if I miss a dose? It is important not to miss your dose. Call your doctor or health care professional if you are unable to keep an appointment. What may interact with this medicine? Do not take this medicine with any of the following medications: -itraconazole -voriconazole This medicine may also interact with the following medications: -cyclosporine -erythromycin -fluconazole -ketoconazole -medicines for HIV like delavirdine, efavirenz, nevirapine -medicines for seizures like ethotoin, fosphenotoin, phenytoin -medicines to increase blood counts like filgrastim, pegfilgrastim, sargramostim -other chemotherapy drugs like cisplatin, mitomycin, paclitaxel -vaccines Talk to your doctor or health care professional before taking any of these medicines: -acetaminophen -aspirin -ibuprofen -ketoprofen -naproxen This list may not describe all  possible interactions. Give your health care provider a list of all the medicines, herbs, non-prescription drugs, or dietary supplements you use. Also tell them if you  smoke, drink alcohol, or use illegal drugs. Some items may interact with your medicine. What should I watch for while using this medicine? Your condition will be monitored carefully while you are receiving this medicine. You will need important blood work done while you are taking this medicine. This drug may make you feel generally unwell. This is not uncommon, as chemotherapy can affect healthy cells as well as cancer cells. Report any side effects. Continue your course of treatment even though you feel ill unless your doctor tells you to stop. In some cases, you may be given additional medicines to help with side effects. Follow all directions for their use. Call your doctor or health care professional for advice if you get a fever, chills or sore throat, or other symptoms of a cold or flu. Do not treat yourself. This drug decreases your body's ability to fight infections. Try to avoid being around people who are sick. This medicine may increase your risk to bruise or bleed. Call your doctor or health care professional if you notice any unusual bleeding. Be careful brushing and flossing your teeth or using a toothpick because you may get an infection or bleed more easily. If you have any dental work done, tell your dentist you are receiving this medicine. Avoid taking products that contain aspirin, acetaminophen, ibuprofen, naproxen, or ketoprofen unless instructed by your doctor. These medicines may hide a fever. Do not become pregnant while taking this medicine. Women should inform their doctor if they wish to become pregnant or think they might be pregnant. There is a potential for serious side effects to an unborn child. Talk to your health care professional or pharmacist for more information. Do not breast-feed an infant while taking this medicine. Men must use a latex condom during sexual contact with a woman while taking this medicine and for 3 months after you stop taking this medicine. A latex  condom is needed even if you have had a vasectomy. Contact your doctor right away if your partner becomes pregnant. Do not donate sperm while taking this medicine and for 3 months after you stop taking this medicine. Men should inform their doctors if they wish to father a child. This medicine may lower sperm counts. What side effects may I notice from receiving this medicine? Side effects that you should report to your doctor or health care professional as soon as possible: -allergic reactions like skin rash, itching or hives, swelling of the face, lips, or tongue -low blood counts - This drug may decrease the number of white blood cells, red blood cells and platelets. You may be at increased risk for infections and bleeding. -signs of infection - fever or chills, cough, sore throat, pain or difficulty passing urine -signs of decreased platelets or bleeding - bruising, pinpoint red spots on the skin, black, tarry stools, nosebleeds -signs of decreased red blood cells - unusually weak or tired, fainting spells, lightheadedness -breathing problems -chest pain -constipation -cough -mouth sores -nausea and vomiting -pain, swelling, redness or irritation at the injection site -pain, tingling, numbness in the hands or feet -stomach pain -trouble passing urine or change in the amount of urine Side effects that usually do not require medical attention (report to your doctor or health care professional if they continue or are bothersome): -diarrhea -hair loss -jaw pain -loss of appetite This  list may not describe all possible side effects. Call your doctor for medical advice about side effects. You may report side effects to FDA at 1-800-FDA-1088. Where should I keep my medicine? This drug is given in a hospital or clinic and will not be stored at home. NOTE: This sheet is a summary. It may not cover all possible information. If you have questions about this medicine, talk to your doctor,  pharmacist, or health care provider.  2018 Elsevier/Gold Standard (2013-11-13 11:37:59) Prochlorperazine injection What is this medicine? PROCHLORPERAZINE (proe klor PER a zeen) helps to control severe nausea and vomiting. This medicine is also used to treat schizophrenia. It can also help patients who experience anxiety that is not due to psychological illness. This medicine may be used for other purposes; ask your health care provider or pharmacist if you have questions. COMMON BRAND NAME(S): Compazine What should I tell my health care provider before I take this medicine? They need to know if you have any of these conditions: -blood disorders or disease -dementia -liver disease or jaundice -Parkinson's disease -uncontrollable movement disorder -an unusual or allergic reaction to prochlorperazine, other medicines, foods, dyes, or preservatives -pregnant or trying to get pregnant -breast-feeding How should I use this medicine? This medicine is for injection into a muscle, or injection or infusion into a vein. It is given by a health care professional in a hospital or clinic setting. Talk to your pediatrician regarding the use of this medicine in children. While this drug may be prescribed for children as young as 58 years of age for selected conditions, precautions do apply. Overdosage: If you think you have taken too much of this medicine contact a poison control center or emergency room at once. NOTE: This medicine is only for you. Do not share this medicine with others. What if I miss a dose? This does not apply. What may interact with this medicine? Do not take this medicine with any of the following medications: -amoxapine -antidepressants like citalopram, escitalopram, fluoxetine, paroxetine, and sertraline -deferoxamine -dofetilide -maprotiline -tricyclic antidepressants like amitriptyline, clomipramine, imipramine, nortriptyline and others This medicine may also interact with  the following medications: -lithium -medicines for pain -phenytoin -propranolol -warfarin This list may not describe all possible interactions. Give your health care provider a list of all the medicines, herbs, non-prescription drugs, or dietary supplements you use. Also tell them if you smoke, drink alcohol, or use illegal drugs. Some items may interact with your medicine. What should I watch for while using this medicine? Your condition will be monitored carefully while you are receiving this medicine. You may get drowsy or dizzy. Do not drive, use machinery, or do anything that needs mental alertness until you know how this medicine affects you. Do not stand or sit up quickly, especially if you are an older patient. This reduces the risk of dizzy or fainting spells. Alcohol may interfere with the effect of this medicine. Avoid alcoholic drinks. This medicine can reduce the response of your body to heat or cold. Dress warm in cold weather and stay hydrated in hot weather. If possible, avoid extreme temperatures like saunas, hot tubs, very hot or cold showers, or activities that can cause dehydration such as vigorous exercise. This medicine can make you more sensitive to the sun. Keep out of the sun. If you cannot avoid being in the sun, wear protective clothing and use sunscreen. Do not use sun lamps or tanning beds/booths. Your mouth may get dry. Chewing sugarless gum or sucking hard  candy, and drinking plenty of water may help. Contact your doctor if the problem does not go away or is severe. What side effects may I notice from receiving this medicine? Side effects that you should report to your doctor or health care professional as soon as possible: -abnormal production of milk in females -allergic reactions like skin rash, itching or hives, swelling of the face, lips, or tongue -blurred vision -breast enlargement in both males and females -breathing problems -chest pain, fast or irregular  heartbeat -confusion, restlessness -dark yellow or brown urine -dizziness or fainting spells -drooling, shaking, movement difficulty, or rigidity -fever, chills, sore throat -involuntary or uncontrollable movements of the eyes, mouth, head, arms, and legs -seizures -stomach area pain -unusually weak or tired -unusual bleeding or bruising -yellowing of skin or eyes Side effects that usually do not require medical attention (report to your doctor or health care professional if they continue or are bothersome): -difficulty passing urine -difficulty sleeping -headache -sexual dysfunction This list may not describe all possible side effects. Call your doctor for medical advice about side effects. You may report side effects to FDA at 1-800-FDA-1088. Where should I keep my medicine? This drug is given in a hospital or clinic and will not be stored at home. NOTE: This sheet is a summary. It may not cover all possible information. If you have questions about this medicine, talk to your doctor, pharmacist, or health care provider.  2018 Elsevier/Gold Standard (2011-10-04 16:58:03)

## 2018-03-01 NOTE — Progress Notes (Signed)
@   1235 patient c/o feeling funny, stated she thought it was the compazine that  was given as a premed. States she feels nauseated. Stopped gemzar and gave methylprednesilone 125 mg. staes her chest feels heavy. Given ativan 0.5 SL. C/o pain coming back. Dr Marin Olp asked what works for he IV? Patient stated dilaudid. Per dr Marin Olp, gave dilaudid 2 mg ivp. 5 [place] Progression Recent Vital Signs   Pulse 94   LMP  (LMP Unknown)    Past Medical History:  Diagnosis Date  . Allergic rhinitis   . Deaf, left   . Diverticulosis    sigmoid colon  . Dyspnea   . Encounter for antineoplastic chemotherapy 04/18/2016  . Encounter for antineoplastic immunotherapy 07/21/2016  . GERD (gastroesophageal reflux disease)   . Hyperlipidemia   . 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 Lung nodules   . Migraine   . Odynophagia 05/24/2016  . Pneumonia   . PONV (postoperative nausea and vomiting)   . Tachycardia, paroxysmal ([place]) 09/08/2016  . Wears glasses      Expected Discharge Date     Diet Order    None       VTE Documentation      Work Intensity Score/Level of Care     @LEVELOFCARE @   Mobility        Significant Events    DC Barriers   Abnormal Labs:  Randolm Idol 03/01/2018, 1:20 PM 5 [place] Progression Recent Vital Signs   Pulse 94   LMP  (LMP Unknown)    Past Medical History:  Diagnosis Date  . Allergic rhinitis   . Deaf, left   . Diverticulosis    sigmoid colon  . Dyspnea   . Encounter for antineoplastic chemotherapy 04/18/2016  . Encounter for antineoplastic immunotherapy 07/21/2016  . GERD (gastroesophageal reflux disease)   . Hyperlipidemia   . 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 Lung nodules   . Migraine   . Odynophagia 05/24/2016  . Pneumonia   . PONV (postoperative nausea and vomiting)   . Tachycardia, paroxysmal ([place]) 09/08/2016  . Wears glasses      Expected  Discharge Date     Diet Order    None       VTE Documentation      Work Intensity Score/Level of Care     @LEVELOFCARE @   Mobility        Significant Events    DC Barriers   Abnormal Labs:  Randolm Idol 03/01/2018, 1:20 PM of ativSL.

## 2018-03-02 ENCOUNTER — Telehealth: Payer: Self-pay | Admitting: *Deleted

## 2018-03-02 MED FILL — fentaNYL 50 MCG/HR PT72: 50 | 30 days supply | Qty: 15 | Fill #0

## 2018-03-02 NOTE — Telephone Encounter (Signed)
Patient is doing well after her chemotherapy. She had one episode of feeling flush. Explained that this could be related to the steroid dose she received. She also stated she had some nausea which was relieved with compazine.   She has all the needed prn medicine at home. She has no further questions. She knows to call the office if she has any issues or questions.

## 2018-03-04 ENCOUNTER — Emergency Department (HOSPITAL_COMMUNITY): Payer: BLUE CROSS/BLUE SHIELD

## 2018-03-04 ENCOUNTER — Other Ambulatory Visit: Payer: Self-pay

## 2018-03-04 ENCOUNTER — Encounter (HOSPITAL_COMMUNITY): Payer: Self-pay | Admitting: Emergency Medicine

## 2018-03-04 ENCOUNTER — Emergency Department (HOSPITAL_COMMUNITY)
Admission: EM | Admit: 2018-03-04 | Discharge: 2018-03-04 | Disposition: A | Discharge status: Home / Self Care | Payer: BLUE CROSS/BLUE SHIELD | Attending: Emergency Medicine | Admitting: Emergency Medicine

## 2018-03-04 ENCOUNTER — Ambulatory Visit (HOSPITAL_COMMUNITY)
Admission: EM | Admit: 2018-03-04 | Discharge: 2018-03-04 | Disposition: A | Discharge status: Nursing Home (Includes ICF) | Payer: BLUE CROSS/BLUE SHIELD | Source: Home / Self Care

## 2018-03-04 DIAGNOSIS — R319 Hematuria, unspecified: Secondary | ICD-10-CM | POA: Insufficient documentation

## 2018-03-04 DIAGNOSIS — R0600 Dyspnea, unspecified: Secondary | ICD-10-CM | POA: Diagnosis not present

## 2018-03-04 DIAGNOSIS — R0789 Other chest pain: Secondary | ICD-10-CM

## 2018-03-04 DIAGNOSIS — Z79899 Other long term (current) drug therapy: Secondary | ICD-10-CM | POA: Diagnosis not present

## 2018-03-04 DIAGNOSIS — R310 Gross hematuria: Secondary | ICD-10-CM

## 2018-03-04 DIAGNOSIS — Z87891 Personal history of nicotine dependence: Secondary | ICD-10-CM | POA: Insufficient documentation

## 2018-03-04 DIAGNOSIS — R079 Chest pain, unspecified: Secondary | ICD-10-CM | POA: Diagnosis not present

## 2018-03-04 DIAGNOSIS — R0602 Shortness of breath: Secondary | ICD-10-CM

## 2018-03-04 LAB — URINALYSIS, ROUTINE W REFLEX MICROSCOPIC
BACTERIA UA: NONE SEEN
Bilirubin Urine: NEGATIVE
Glucose, UA: NEGATIVE mg/dL
Ketones, ur: NEGATIVE mg/dL
NITRITE: NEGATIVE
PROTEIN: 100 mg/dL — AB
RBC / HPF: >50 RBC/hpf — ABNORMAL HIGH (ref 0–5)
SPECIFIC GRAVITY, URINE: 1.012 (ref 1.005–1.030)
pH: 6 (ref 5.0–8.0)

## 2018-03-04 LAB — PROTIME-INR
INR: 1.23
PROTHROMBIN TIME: 15.4 s — AB (ref 11.4–15.2)

## 2018-03-04 LAB — BASIC METABOLIC PANEL
Anion gap: 10 (ref 5–15)
BUN: 6 mg/dL (ref 6–20)
CO2: 25 mmol/L (ref 22–32)
Calcium: 8.4 mg/dL — ABNORMAL LOW (ref 8.9–10.3)
Chloride: 95 mmol/L — ABNORMAL LOW (ref 98–111)
Creatinine, Ser: 0.58 mg/dL (ref 0.44–1.00)
Glucose, Bld: 99 mg/dL (ref 70–99)
Potassium: 4.4 mmol/L (ref 3.5–5.1)
SODIUM: 130 mmol/L — AB (ref 135–145)

## 2018-03-04 LAB — CBC
HEMATOCRIT: 43.4 % (ref 36.0–46.0)
Hemoglobin: 13.8 g/dL (ref 12.0–15.0)
MCH: 30.3 pg (ref 26.0–34.0)
MCHC: 31.8 g/dL (ref 30.0–36.0)
MCV: 95.2 fL (ref 78.0–100.0)
Platelets: 104 10*3/uL — ABNORMAL LOW (ref 150–400)
RBC: 4.56 MIL/uL (ref 3.87–5.11)
RDW: 14.3 % (ref 11.5–15.5)
WBC: 10 10*3/uL (ref 4.0–10.5)

## 2018-03-04 LAB — I-STAT CG4 LACTIC ACID, ED
Lactic Acid, Venous: 2.34 mmol/L (ref 0.5–1.9)
Lactic Acid, Venous: 1.2 mmol/L (ref 0.5–1.9)

## 2018-03-04 MED ORDER — HYDROMORPHONE HCL 1 MG/ML IJ SOLN
1.0000 mg | Freq: Once | INTRAMUSCULAR | Status: AC
  Administered 2018-03-04: 1 mg via INTRAVENOUS
  Filled 2018-03-04: qty 1

## 2018-03-04 MED ORDER — HEPARIN SOD (PORK) LOCK FLUSH 100 UNIT/ML IV SOLN
500.0000 [IU] | Freq: Once | INTRAVENOUS | Status: AC
  Administered 2018-03-04: 500 [IU]
  Filled 2018-03-04: qty 5

## 2018-03-04 MED ORDER — LORAZEPAM 2 MG/ML IJ SOLN
1.0000 mg | Freq: Once | INTRAMUSCULAR | Status: AC
  Administered 2018-03-04: 1 mg via INTRAVENOUS
  Filled 2018-03-04: qty 1

## 2018-03-04 MED ORDER — IOPAMIDOL (ISOVUE-370) INJECTION 76%
100.0000 mL | Freq: Once | INTRAVENOUS | Status: AC | PRN
  Administered 2018-03-04: 100 mL via INTRAVENOUS

## 2018-03-04 MED ORDER — ALBUTEROL SULFATE (2.5 MG/3ML) 0.083% IN NEBU
5.0000 mg | INHALATION_SOLUTION | Freq: Once | RESPIRATORY_TRACT | Status: AC
  Administered 2018-03-04: 5 mg via RESPIRATORY_TRACT
  Filled 2018-03-04: qty 6

## 2018-03-04 MED ORDER — IOPAMIDOL (ISOVUE-370) INJECTION 76%
INTRAVENOUS | Status: AC
  Filled 2018-03-04: qty 100

## 2018-03-04 MED ORDER — SODIUM CHLORIDE 0.9 % IV BOLUS
1000.0000 mL | Freq: Once | INTRAVENOUS | Status: AC
  Administered 2018-03-04: 1000 mL via INTRAVENOUS

## 2018-03-04 NOTE — ED Triage Notes (Signed)
Pt here for hematuria; pt having chemo treatments currently

## 2018-03-04 NOTE — Discharge Instructions (Addendum)
As discussed, it is very important that you schedule follow-up with your oncologist tomorrow.  Return here for concerning changes in your condition.

## 2018-03-04 NOTE — ED Triage Notes (Signed)
Pt reports she began having hematuria this am. Reports pain in bilateral hips. Just recently finished radiation and started chemo Thursday for stage IV lung cancer, states she was also recently treated with abx for bronchitis. Denies any other urinary symptoms.

## 2018-03-04 NOTE — ED Notes (Signed)
Patient transported to X-ray 

## 2018-03-04 NOTE — ED Provider Notes (Signed)
  [facility]    CSN: [id number] Arrival date & time: 03/04/18  1241     History   Chief Complaint Chief Complaint  Patient presents with  . Hematuria    HPI Debbie Baker is a 52 y.o. female.   HPI Patient, currently undergoing chemotherapy for metastatic lung cancer, presents today with acute onset hematuria and feeling poorly. She is accompanied by her husband, who reports she suffered a  "bad reaction" to her first dose on chemotherapy on Thursday. She has no symptoms of UTI and reports that she is urinating blood and has some shortness of breath which she describes as "difficulty catching her breath". 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 She is being deferred to the ER for further work-up and management given that recent chemotherapy treatment, she is tachycardic, appears is very ill. Patient and family agreement with plan. Husband agrees to transport next door to ED. Staff transported patient to car in wheelchair.   Scot Jun, FNP 03/04/18 [phone]

## 2018-03-04 NOTE — ED Provider Notes (Signed)
[facility] EMERGENCY DEPARTMENT Provider Note   CSN: [id number] Arrival date & time: 03/04/18  1437     History   Chief Complaint Chief Complaint  Patient presents with  . Hematuria  . Tachycardia    HPI Debbie Baker is a 52 y.o. female.  HPI  Patient with multiple medical issues notably including metastatic lung cancer, now presents with dyspnea, chest pain, hematuria. Patient notes that she typically has some degree of palpitations, tachycardia, but over the past few days, she has developed chest tightness, dyspnea, without clear precipitant. Last chemotherapy was 1 week ago. Patient also developed hematuria over the past day, without dysuria. No new syncope, near syncope. 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 Patient is completed radiation therapy.  When she is here with her husband who assists with the HPI.   Past Medical History:  Diagnosis Date  . Allergic rhinitis   . Deaf, left   . Diverticulosis    sigmoid colon  . Dyspnea   . Encounter for antineoplastic chemotherapy 04/18/2016  . Encounter for antineoplastic immunotherapy 07/21/2016  . GERD (gastroesophageal reflux disease)   . Hyperlipidemia   . Lung cancer ([place]) 03/2016   non small cell lung cancer favoring adenocarcinoma   . Lung nodules   . Migraine   . Odynophagia 05/24/2016  . Pneumonia   . PONV (postoperative nausea and vomiting)   . Tachycardia, paroxysmal ([place]) 09/08/2016  . Wears glasses     Patient Active Problem List   Diagnosis Date Noted  . 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 Mid back pain 12/08/2017  . Port-A-Cath in place 07/06/2017  . Sinusitis 06/08/2017  . 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 Shortness of breath 05/18/2017  . Liver metastasis ([place]) 05/18/2017  . Brain metastases ([place]) 05/18/2017  . Palpitations 10/28/2016  . Hyperlipidemia 09/14/2016  . Tachycardia, paroxysmal ([place]) 09/08/2016  .  Encounter for antineoplastic immunotherapy 07/21/2016  . Odynophagia 05/24/2016  . Extravasation accident, initial encounter 05/02/2016  . Adenocarcinoma of left lung, stage 3 ([place]) 04/18/2016  . Encounter for antineoplastic chemotherapy 04/18/2016  . Hilar mass   . Mediastinal lymphadenopathy   . Hemoptysis 08/07/2015  . Abnormal CT scan of lung 08/07/2015    Past Surgical History:  Procedure Laterality Date  . ABDOMINAL HYSTERECTOMY    . IR FLUORO GUIDE PORT INSERTION RIGHT  06/26/2017  . IR US GUIDE VASC ACCESS RIGHT  06/26/2017  . RADIOLOGY WITH ANESTHESIA N/A 01/09/2018   Procedure: MRI BRAIN WITH AND WITHOUT CONTRAST MRI THORACIC SPINE WITH AND WITHOUT CONTRAST;  Surgeon: Radiologist, Medication, MD;  Location: [facility];  Service: Radiology;  Laterality: N/A;  . VIDEO BRONCHOSCOPY Bilateral 07/03/2015   Procedure: VIDEO BRONCHOSCOPY WITH FLUORO;  Surgeon: Marshell Garfinkel, MD;  Location: [facility];  Service: Cardiopulmonary;  Laterality: Bilateral;  . VIDEO BRONCHOSCOPY WITH ENDOBRONCHIAL ULTRASOUND N/A 04/06/2016   Procedure: VIDEO BRONCHOSCOPY WITH ENDOBRONCHIAL ULTRASOUND;  Surgeon: Collene Gobble, MD;  Location: [facility];  Service: Thoracic;  Laterality: N/A;  . WRIST FRACTURE SURGERY       OB History   None      Home Medications    Prior to Admission medications   Medication Sig Start Date End Date Taking? Authorizing Provider  albuterol (PROVENTIL HFA;VENTOLIN HFA) 108 (90 Base) MCG/ACT inhaler Inhale 2 puffs into the lungs every 6 (six) hours as needed for wheezing or shortness of breath. 01/25/18  Yes Ennever, Rudell Cobb, MD  antiseptic oral rinse (BIOTENE) LIQD 15 mLs by Mouth Rinse route as needed for dry mouth. 11/08/17  Yes Ennever, Rudell Cobb, MD  benzonatate (TESSALON) 100 MG capsule Take 1 capsule (100 mg total) by mouth every 4 (four) hours as needed for cough. 07/27/17  Yes Curt Bears, MD  Black Elderberry (SAMBUCUS ELDERBERRY PO) Take 50 mg by mouth daily.   Yes  [provider]  Calcium-Magnesium-Vitamin D 185-50-100 MG-MG-UNIT CAPS Take 1 tablet by mouth every evening.   Yes [provider]  cetirizine (ZYRTEC) 10 MG tablet Take 10 mg by mouth daily.   Yes [provider]  clarithromycin (BIAXIN) 250 MG tablet Take 1 tablet (250 mg total) by mouth 2 (two) times daily. 02/27/18  Yes Volanda Napoleon, MD  diphenhydrAMINE (BENADRYL) 25 mg capsule Take 25 mg by mouth at bedtime as needed for allergies or sleep.    Yes [provider]  dronabinol (MARINOL) 5 MG capsule Take 1 capsule (5 mg total) by mouth 2 (two) times daily before lunch and supper. 11/23/17  Yes Ennever, Rudell Cobb, MD  fentaNYL (DURAGESIC - DOSED MCG/HR) 100 MCG/HR Place 1 patch (100 mcg total) onto the skin every other day. 02/20/18  Yes Ennever, Rudell Cobb, MD  fentaNYL (DURAGESIC - DOSED MCG/HR) 50 MCG/HR Place 1 patch (50 mcg total) onto the skin every other day. 02/27/18  Yes Volanda Napoleon, MD  folic acid (FOLVITE) 1 MG tablet Take 1 tablet (1 mg total) by mouth daily. 02/15/18  Yes Volanda Napoleon, MD  Lactobacillus (PROBIOTIC ACIDOPHILUS) CAPS Take 1 capsule by mouth daily.    Yes [provider]  lidocaine-prilocaine (EMLA) cream Apply 1 application topically as needed. Patient taking differently: Apply 1 application topically as needed (for pain).  01/25/18  Yes Ennever, Rudell Cobb, MD  LORazepam (ATIVAN) 0.5 MG tablet Take 1 tablet (0.5 mg total) by mouth as needed for anxiety (take 30 min prior to MRI scans and treatments). 05/18/17  Yes Bruning, Ashlyn, PA-C  magic mouthwash w/lidocaine SOLN Take 5 mLs by mouth 4 (four) times daily as needed (pain with swallowing). 02/23/18  Yes Bruning, Ashlyn, PA-C  Milk Thistle 1000 MG CAPS Take 1,000 mg by mouth daily.    Yes [provider]  Multiple Vitamins-Minerals (WOMENS 50+ MULTI VITAMIN/MIN) TABS Take 1 tablet by mouth daily.   Yes [provider]  Oxycodone HCl 20 MG TABS Take 1 tablet  (20 mg total) by mouth every 4 (four) hours as needed. Patient taking differently: Take 20 mg by mouth every 4 (four) hours as needed (pain).  02/15/18  Yes Volanda Napoleon, MD  pantoprazole (PROTONIX) 40 MG tablet Take 1 tablet (40 mg total) 2 (two) times daily by mouth. 04/04/17  Yes Armbruster, Carlota Raspberry, MD  pravastatin (PRAVACHOL) 20 MG tablet Take 20 mg by mouth daily.  08/07/15  Yes [provider]  prochlorperazine (COMPAZINE) 10 MG tablet Take 1 tablet (10 mg total) by mouth every 6 (six) hours as needed for nausea or vomiting. 02/02/18  Yes Ennever, Rudell Cobb, MD  sucralfate (CARAFATE) 1 g tablet Take 1 tablet (1 g total) by mouth 4 (four) times daily -  with meals and at bedtime. Crush and dissolve in 8 oz water Patient taking differently: Take 1 g by mouth as needed. Crush and dissolve in 8 oz water 02/23/18  Yes Bruning, Ashlyn, PA-C  TURMERIC CURCUMIN PO Take 2,050 mg by mouth 2 (two) times daily.   Yes [provider]  magic mouthwash SOLN Take 5 mLs by mouth 4 (four) times daily -  with meals and at bedtime. Called in 08/05/17 by on call RN, Components benadryl  525 mg, hydrocortisone 60 mg and nystatin 0.6 mg. 240 ml Patient not taking: Reported on 03/04/2018 11/08/17   Volanda Napoleon, MD  meloxicam (MOBIC) 15 MG tablet Take 1 tablet (15 mg total) by mouth daily. Patient not taking: Reported on 03/04/2018 02/02/18   Volanda Napoleon, MD    Family History Family History  Problem Relation Age of Onset  . Esophageal cancer Father   . Breast cancer Mother     Social History Social History   Tobacco Use  . Smoking status: Former Smoker    Packs/day: 1.50    Years: 30.00    Pack years: 45.00    Types: Cigarettes    Last attempt to quit: 08/02/2010    Years since quitting: 7.5  . Smokeless tobacco: Never Used  Substance Use Topics  . Alcohol use: Not Currently    Alcohol/week: 0.0 standard drinks  . Drug use: Yes    Types: Marijuana    Comment: last use 02/2016      Allergies   Penicillins; Levofloxacin; and Varenicline   Review of Systems Review of Systems  Constitutional:       Per HPI, otherwise negative  HENT:       Per HPI, otherwise negative  Respiratory:       Per HPI, otherwise negative  Cardiovascular:       Per HPI, otherwise negative  Gastrointestinal: Negative for vomiting.  Endocrine:       Negative aside from HPI  Genitourinary:       Neg aside from HPI   Musculoskeletal:       Per HPI, otherwise negative  Skin: Negative.   Allergic/Immunologic: Positive for immunocompromised state.  Neurological: Positive for weakness and light-headedness. Negative for syncope.     Physical Exam Updated Vital Signs BP 126/89   Pulse (!) 129   Temp 98.4 F (36.9 C) (Oral)   Resp (!) 26   LMP  (LMP Unknown)   SpO2 94%   Physical Exam  Constitutional: She is oriented to person, place, and time. She has a sickly appearance. No distress.  HENT:  Head: Normocephalic and atraumatic.  Eyes: Conjunctivae and EOM are normal.  Cardiovascular: Regular rhythm. Tachycardia present.  Pulmonary/Chest: Tachypnea noted. She has decreased breath sounds.  Abdominal: She exhibits no distension. There is no tenderness.  Musculoskeletal: She exhibits no edema.  Neurological: She is alert and oriented to person, place, and time. No cranial nerve deficit.  Skin: Skin is warm and dry.  Psychiatric: She has a normal mood and affect.  Nursing note and vitals reviewed.    ED Treatments / Results  Labs (all labs ordered are listed, but only abnormal results are displayed) Labs Reviewed  URINALYSIS, ROUTINE W REFLEX MICROSCOPIC - Abnormal; Notable for the following components:      Result Value   Color, Urine AMBER (*)    Hgb urine dipstick LARGE (*)    Protein, ur 100 (*)    Leukocytes, UA TRACE (*)    RBC / HPF >50 (*)    WBC, UA >50 (*)    All other components within normal limits  BASIC METABOLIC PANEL - Abnormal; Notable for the  following components:   Sodium 130 (*)    Chloride 95 (*)    Calcium 8.4 (*)  All other components within normal limits  CBC - Abnormal; Notable for the following components:   Platelets 104 (*)    All other components within normal limits  PROTIME-INR - Abnormal; Notable for the following components:   Prothrombin Time 15.4 (*)    All other components within normal limits  I-STAT CG4 LACTIC ACID, ED - Abnormal; Notable for the following components:   Lactic Acid, Venous 2.34 (*)    All other components within normal limits  URINE CULTURE  I-STAT CG4 LACTIC ACID, ED    EKG EKG Interpretation  Date/Time:  Sunday March 04 2018 17:56:28 EDT Ventricular Rate:  123 PR Interval:  148 QRS Duration: 104 QT Interval:  346 QTC Calculation: 495 R Axis:   -51 Text Interpretation:  Sinus tachycardia Left axis deviation Incomplete right bundle branch block Anterior infarct , age undetermined T wave abnormality Abnormal ekg Confirmed by Carmin Muskrat [phone]) on 03/04/2018 8:47:08 PM   Radiology Dg Chest 2 View  Result Date: 03/04/2018 CLINICAL DATA:  Onset hematuria this morning in a patient with stage IV lung carcinoma undergoing chemotherapy. EXAM: CHEST - 2 VIEW COMPARISON:  PA and lateral chest 02/27/2018. FINDINGS: Right IJ Port-A-Cath is unchanged. There are small bilateral pleural effusions, unchanged. Left hilar opacity is again seen. Nodular opacity in the lingula is also unchanged. No pneumothorax. No acute or focal bony abnormality. IMPRESSION: No acute disease. No change in small bilateral pleural effusions. No change in a left hilar opacity and a lingular nodule. Electronically Signed   By: Inge Rise M.D.   On: 03/04/2018 16:39   Ct Angio Chest Pe W/cm &/or Wo Cm  Result Date: 03/04/2018 CLINICAL DATA:  Metastatic lung cancer. Acute onset hematuria and difficulty breathing. EXAM: CT ANGIOGRAPHY CHEST WITH CONTRAST TECHNIQUE: Multidetector CT imaging of the chest was  performed using the standard protocol during bolus administration of intravenous contrast. Multiplanar CT image reconstructions and MIPs were obtained to evaluate the vascular anatomy. CONTRAST:  One hundred cc ISOVUE-370 IOPAMIDOL (ISOVUE-370) INJECTION 76% COMPARISON:  09/21/2017 FINDINGS: Despite efforts by the technologist and patient, motion artifact is present on today's exam and could not be eliminated. This reduces exam sensitivity and specificity. Cardiovascular: Right Port-A-Cath tip: Cavoatrial junction. Atherosclerotic calcification of the aortic arch and branch vessels. No filling defect is identified in the pulmonary arterial tree to suggest pulmonary embolus. Mediastinum/Nodes: Indistinct post radiation therapy findings or adenopathy in the upper mediastinum for example on image 23/7. This is difficult to measure given the infiltrative appearance. Lungs/Pleura: Large region of left perihilar airspace opacity, some of which may be from prior radiation therapy and some from tumor. New and increasing pulmonary nodules in both lungs compatible with malignancy. For example a lingular nodule measures 2.7 by 2.2 cm previously measured 2.2 by 1.5 cm. A new 4 mm right upper lobe pulmonary nodule is present adjacent to several other pre-existing pulmonary nodules which have enlarged. New large right and moderate left pleural effusion. Malignant effusions are not excluded. Associated passive atelectasis. Upper Abdomen: Soft tissue fullness just below the hiatus along the celiac trunk, adenopathy in this region not excluded. There are enlarging metastatic lesions in the liver including a 3.3 cm right hepatic lobe lesion (formerly 1.9 cm) and a 1.6 cm left hepatic lobe lesion (formerly 1.3 cm) on image 84/7. Musculoskeletal: Thoracic spondylosis. Review of the MIP images confirms the above findings. IMPRESSION: 1. No filling defect is identified in the pulmonary arterial tree to suggest pulmonary embolus. 2. New  and  enlarging bilateral pulmonary nodules compatible with widespread metastatic disease. This is less likely to be due to fungal infection or atypical infectious process. 3. New large right and moderate left pleural effusions, nonspecific for malignant involvement. 4. Possible adenopathy/malignancy in the vicinity of the celiac trunk. Enlarging metastatic lesions in the liver. 5.  Aortoiliac atherosclerotic vascular disease. 6. Extensive perihilar densities on the left probably from radiation therapy and/or tumor. Electronically Signed   By: Van Clines M.D.   On: 03/04/2018 19:39    Procedures Procedures (including critical care time)  Medications Ordered in ED Medications  sodium chloride 0.9 % bolus 1,000 mL (0 mLs Intravenous Stopped 03/04/18 1938)  HYDROmorphone (DILAUDID) injection 1 mg (1 mg Intravenous Given 03/04/18 1745)  LORazepam (ATIVAN) injection 1 mg (1 mg Intravenous Given 03/04/18 1827)  iopamidol (ISOVUE-370) 76 % injection 100 mL (100 mLs Intravenous Contrast Given 03/04/18 1856)  albuterol (PROVENTIL) (2.5 MG/3ML) 0.083% nebulizer solution 5 mg (5 mg Nebulization Given 03/04/18 2027)     Initial Impression / Assessment and Plan / ED Course  I have reviewed the triage vital signs and the nursing notes.  Pertinent labs & imaging results that were available during my care of the patient were reviewed by me and considered in my medical decision making (see chart for details).   :, Patient in similar condition, anxious, tachycardic, tachypneic. Given her history of ongoing therapy for cancer, PE is a consideration, and CT angiography is ordered.   9:22 PM Results were reviewed at length with the patient and her husband, including demonstration of the pictures from the CT angiography to her and him. We discussed increased tumor burden, absence of pulmonary embolism. Patient has improved here with breathing treatment, fluids, though she remains mildly tachycardic. She has  no ongoing chest pain. Last urination added diminished darkness. No obvious etiology for hematuria, but there is some suggestion of infection, the tumor progression is another consideration. No evidence for pulmonary embolism, though with increased work of breathing, tachypnea, tachycardia, I recommended hospitalization for further evaluation and management. However, discharged with next day oncology follow-up is a reasonable alternative, and the patient had a strong preference for this, given her substantial chronic disease burden.   Final Clinical Impressions(s) / ED Diagnoses  Dyspnea Hematuria Chest pain   Carmin Muskrat, MD 03/04/18 2124

## 2018-03-04 NOTE — ED Notes (Signed)
Waiting for porta-cath to be deaccessed

## 2018-03-05 ENCOUNTER — Other Ambulatory Visit: Payer: Self-pay | Admitting: Hematology & Oncology

## 2018-03-05 ENCOUNTER — Other Ambulatory Visit: Payer: Self-pay | Admitting: *Deleted

## 2018-03-05 ENCOUNTER — Telehealth: Payer: Self-pay | Admitting: *Deleted

## 2018-03-05 DIAGNOSIS — C3492 Malignant neoplasm of unspecified part of left bronchus or lung: Secondary | ICD-10-CM

## 2018-03-05 LAB — URINE CULTURE: Culture: NO GROWTH

## 2018-03-05 MED ORDER — OXYCODONE HCL 20 MG PO TABS: 20.0000 mg | ORAL_TABLET | ORAL | 0 refills | Status: AC | PRN

## 2018-03-05 MED FILL — oxyCODONE HCL 20 MG TABS: 20 | 20 days supply | Qty: 120 | Fill #0

## 2018-03-05 NOTE — Telephone Encounter (Signed)
Patient was seen in the ED over the weekend. She is still experiencing SOB. She also still states she feels lethargic after receiving IV ativan for her scan.   Reviewed visit with Dr Marin Olp. He would like patient to have a thoracentesis. Orders have been placed.   Patient aware of orders. She knows radiology will reach out to schedule.

## 2018-03-07 ENCOUNTER — Other Ambulatory Visit: Payer: Self-pay

## 2018-03-07 ENCOUNTER — Ambulatory Visit (HOSPITAL_COMMUNITY)
Admission: RE | Admit: 2018-03-07 | Discharge: 2018-03-07 | Disposition: A | Discharge status: Home / Self Care | Payer: BLUE CROSS/BLUE SHIELD | Source: Ambulatory Visit | Attending: Physician Assistant | Admitting: Physician Assistant

## 2018-03-07 ENCOUNTER — Encounter: Payer: Self-pay | Admitting: Hematology & Oncology

## 2018-03-07 ENCOUNTER — Ambulatory Visit (HOSPITAL_COMMUNITY)
Admission: RE | Admit: 2018-03-07 | Discharge: 2018-03-07 | Disposition: A | Discharge status: Home / Self Care | Payer: BLUE CROSS/BLUE SHIELD | Source: Ambulatory Visit | Attending: Hematology & Oncology | Admitting: Hematology & Oncology

## 2018-03-07 DIAGNOSIS — J90 Pleural effusion, not elsewhere classified: Secondary | ICD-10-CM

## 2018-03-07 DIAGNOSIS — C3492 Malignant neoplasm of unspecified part of left bronchus or lung: Secondary | ICD-10-CM

## 2018-03-07 LAB — ALBUMIN, PLEURAL OR PERITONEAL FLUID: ALBUMIN FL: 2.9 g/dL

## 2018-03-07 LAB — LACTATE DEHYDROGENASE, PLEURAL OR PERITONEAL FLUID: LD FL: 640 U/L — AB (ref 3–23)

## 2018-03-07 MED ORDER — LIDOCAINE HCL 1 % IJ SOLN
INTRAMUSCULAR | Status: AC
  Filled 2018-03-07: qty 20

## 2018-03-07 NOTE — Procedures (Signed)
PROCEDURE SUMMARY:  Successful US guided right thoracentesis. Yielded 1.2 L of clear yellow fluid. Patient tolerated procedure well. No immediate complications.  Specimen was sent for labs.  Post procedure chest X-ray reveals no pneumothorax  Renaud Celli S Carmelia Tiner PA-C 03/07/2018 11:05 AM

## 2018-03-08 ENCOUNTER — Other Ambulatory Visit: Payer: Self-pay | Admitting: Oncology

## 2018-03-08 ENCOUNTER — Inpatient Hospital Stay: Payer: BLUE CROSS/BLUE SHIELD

## 2018-03-08 ENCOUNTER — Other Ambulatory Visit: Payer: Self-pay

## 2018-03-08 VITALS — BP 119/83 | HR 115 | Temp 97.6°F | Resp 18

## 2018-03-08 DIAGNOSIS — C3492 Malignant neoplasm of unspecified part of left bronchus or lung: Secondary | ICD-10-CM

## 2018-03-08 DIAGNOSIS — Z95828 Presence of other vascular implants and grafts: Secondary | ICD-10-CM

## 2018-03-08 DIAGNOSIS — C3412 Malignant neoplasm of upper lobe, left bronchus or lung: Secondary | ICD-10-CM | POA: Diagnosis not present

## 2018-03-08 LAB — CBC WITH DIFFERENTIAL (CANCER CENTER ONLY)
Abs Immature Granulocytes: 0.02 10*3/uL (ref 0.00–0.07)
Basophils Absolute: 0 10*3/uL (ref 0.0–0.1)
Basophils Relative: 1 %
Eosinophils Absolute: 0 10*3/uL (ref 0.0–0.5)
Eosinophils Relative: 1 %
HCT: 37.1 % (ref 36.0–46.0)
Hemoglobin: 12.2 g/dL (ref 12.0–15.0)
IMMATURE GRANULOCYTES: 1 %
Lymphocytes Relative: 11 %
Lymphs Abs: 0.2 10*3/uL — ABNORMAL LOW (ref 0.7–4.0)
MCH: 29.3 pg (ref 26.0–34.0)
MCHC: 32.9 g/dL (ref 30.0–36.0)
MCV: 89 fL (ref 80.0–100.0)
MONOS PCT: 7 %
Monocytes Absolute: 0.1 10*3/uL (ref 0.1–1.0)
NEUTROS PCT: 79 %
Neutro Abs: 1.3 10*3/uL — ABNORMAL LOW (ref 1.7–7.7)
PLATELETS: 54 10*3/uL — AB (ref 150–400)
RBC: 4.17 MIL/uL (ref 3.87–5.11)
RDW: 13.7 % (ref 11.5–15.5)
WBC Count: 1.6 10*3/uL — ABNORMAL LOW (ref 4.0–10.5)
nRBC: 0 % (ref 0.0–0.2)

## 2018-03-08 LAB — URINALYSIS, COMPLETE (UACMP) WITH MICROSCOPIC
GLUCOSE, UA: NEGATIVE mg/dL
Ketones, ur: 40 mg/dL — AB
NITRITE: NEGATIVE
PROTEIN: 30 mg/dL — AB
SPECIFIC GRAVITY, URINE: 1.015 (ref 1.005–1.030)
pH: 6.5 (ref 5.0–8.0)

## 2018-03-08 LAB — CMP (CANCER CENTER ONLY)
ALT: 116 U/L — ABNORMAL HIGH (ref 10–47)
ANION GAP: 5 (ref 5–15)
AST: 116 U/L — AB (ref 11–38)
Albumin: 3.2 g/dL — ABNORMAL LOW (ref 3.5–5.0)
Alkaline Phosphatase: 321 U/L — ABNORMAL HIGH (ref 26–84)
BILIRUBIN TOTAL: 1.8 mg/dL — AB (ref 0.2–1.6)
BUN: 6 mg/dL — AB (ref 7–22)
CO2: 29 mmol/L (ref 18–33)
CREATININE: 0.6 mg/dL (ref 0.60–1.20)
Calcium: 8.7 mg/dL (ref 8.0–10.3)
Chloride: 94 mmol/L — ABNORMAL LOW (ref 98–108)
Glucose, Bld: 97 mg/dL (ref 73–118)
POTASSIUM: 3.3 mmol/L (ref 3.3–4.7)
Sodium: 128 mmol/L (ref 128–145)
Total Protein: 6.1 g/dL — ABNORMAL LOW (ref 6.4–8.1)

## 2018-03-08 MED ORDER — HEPARIN SOD (PORK) LOCK FLUSH 100 UNIT/ML IV SOLN
500.0000 [IU] | Freq: Once | INTRAVENOUS | Status: AC | PRN
  Administered 2018-03-08: 500 [IU]
  Filled 2018-03-08: qty 5

## 2018-03-08 MED ORDER — SULFAMETHOXAZOLE-TRIMETHOPRIM 800-160 MG PO TABS: 1.0000 | ORAL_TABLET | Freq: Two times a day (BID) | ORAL | 0 refills | Status: DC

## 2018-03-08 MED ORDER — SODIUM CHLORIDE 0.9% FLUSH
10.0000 mL | INTRAVENOUS | Status: DC | PRN
  Administered 2018-03-08: 10 mL
  Filled 2018-03-08: qty 10

## 2018-03-08 MED ORDER — SODIUM CHLORIDE 0.9 % IV SOLN
Freq: Once | INTRAVENOUS | Status: AC
  Administered 2018-03-08: 11:00:00 via INTRAVENOUS
  Filled 2018-03-08: qty 250

## 2018-03-08 MED FILL — SULFAMETHOXAZOLE-TMP DS TAB: 800-160 | 5 days supply | Qty: 10 | Fill #0

## 2018-03-08 NOTE — Progress Notes (Signed)
Patient complains of hematuria x 4 days since last treatment along with hesitancy during urination. Patient states she has mental cloudiness since 1 week which she relates to increased dosage of Fentanyl patient reports she has decreased her dose to 100 mcg patch every 72 hours. Patient reports she has a decreased appetite with frequent nausea in which she takes compazine. Patient reports once episode of vomiting this morning. Patient reports SOB x 1 week. Patient states she does not feel well enough for Chemotherapy today and is entertaining the thought of hospice. Dr. Marin Olp notified.   Hold Chemotherapy today, administer 1 Liter Normal Saline over 2 hours per Dr. Marin Olp. Patient verbalized understanding.

## 2018-03-08 NOTE — Patient Instructions (Signed)
Implanted Port Home Guide An implanted port is a type of central line that is placed under the skin. Central lines are used to provide IV access when treatment or nutrition needs to be given through a person's veins. Implanted ports are used for long-term IV access. An implanted port may be placed because:  You need IV medicine that would be irritating to the small veins in your hands or arms.  You need long-term IV medicines, such as antibiotics.  You need IV nutrition for a long period.  You need frequent blood draws for lab tests.  You need dialysis.  Implanted ports are usually placed in the chest area, but they can also be placed in the upper arm, the abdomen, or the leg. An implanted port has two main parts:  Reservoir. The reservoir is round and will appear as a small, raised area under your skin. The reservoir is the part where a needle is inserted to give medicines or draw blood.  Catheter. The catheter is a thin, flexible tube that extends from the reservoir. The catheter is placed into a large vein. Medicine that is inserted into the reservoir goes into the catheter and then into the vein.  How will I care for my incision site? Do not get the incision site wet. Bathe or shower as directed by your health care provider. How is my port accessed? Special steps must be taken to access the port:  Before the port is accessed, a numbing cream can be placed on the skin. This helps numb the skin over the port site.  Your health care provider uses a sterile technique to access the port. ? Your health care provider must put on a mask and sterile gloves. ? The skin over your port is cleaned carefully with an antiseptic and allowed to dry. ? The port is gently pinched between sterile gloves, and a needle is inserted into the port.  Only "non-coring" port needles should be used to access the port. Once the port is accessed, a blood return should be checked. This helps ensure that the port  is in the vein and is not clogged.  If your port needs to remain accessed for a constant infusion, a clear (transparent) bandage will be placed over the needle site. The bandage and needle will need to be changed every week, or as directed by your health care provider.  Keep the bandage covering the needle clean and dry. Do not get it wet. Follow your health care provider's instructions on how to take a shower or bath while the port is accessed.  If your port does not need to stay accessed, no bandage is needed over the port.  What is flushing? Flushing helps keep the port from getting clogged. Follow your health care provider's instructions on how and when to flush the port. Ports are usually flushed with saline solution or a medicine called heparin. The need for flushing will depend on how the port is used.  If the port is used for intermittent medicines or blood draws, the port will need to be flushed: ? After medicines have been given. ? After blood has been drawn. ? As part of routine maintenance.  If a constant infusion is running, the port may not need to be flushed.  How long will my port stay implanted? The port can stay in for as long as your health care provider thinks it is needed. When it is time for the port to come out, surgery will be   done to remove it. The procedure is similar to the one performed when the port was put in. When should I seek immediate medical care? When you have an implanted port, you should seek immediate medical care if:  You notice a bad smell coming from the incision site.  You have swelling, redness, or drainage at the incision site.  You have more swelling or pain at the port site or the surrounding area.  You have a fever that is not controlled with medicine.  This information is not intended to replace advice given to you by your health care provider. Make sure you discuss any questions you have with your health care provider. Document  Released: 05/16/2005 Document Revised: 10/22/2015 Document Reviewed: 01/21/2013 Elsevier Interactive Patient Education  2017 [employer]  

## 2018-03-08 NOTE — Patient Instructions (Signed)
Dehydration, Adult Dehydration is a condition in which there is not enough fluid or water in the body. This happens when you lose more fluids than you take in. Important organs, such as the kidneys, brain, and heart, cannot function without a proper amount of fluids. Any loss of fluids from the body can lead to dehydration. Dehydration can range from mild to severe. This condition should be treated right away to prevent it from becoming severe. What are the causes? This condition may be caused by:  Vomiting.  Diarrhea.  Excessive sweating, such as from heat exposure or exercise.  Not drinking enough fluid, especially: ? When ill. ? While doing activity that requires a lot of energy.  Excessive urination.  Fever.  Infection.  Certain medicines, such as medicines that cause the body to lose excess fluid (diuretics).  Inability to access safe drinking water.  Reduced physical ability to get adequate water and food.  What increases the risk? This condition is more likely to develop in people:  Who have a poorly controlled long-term (chronic) illness, such as diabetes, heart disease, or kidney disease.  Who are age 65 or older.  Who are disabled.  Who live in a place with high altitude.  Who play endurance sports.  What are the signs or symptoms? Symptoms of mild dehydration may include:  Thirst.  Dry lips.  Slightly dry mouth.  Dry, warm skin.  Dizziness. Symptoms of moderate dehydration may include:  Very dry mouth.  Muscle cramps.  Dark urine. Urine may be the color of tea.  Decreased urine production.  Decreased tear production.  Heartbeat that is irregular or faster than normal (palpitations).  Headache.  Light-headedness, especially when you stand up from a sitting position.  Fainting (syncope). Symptoms of severe dehydration may include:  Changes in skin, such as: ? Cold and clammy skin. ? Blotchy (mottled) or pale skin. ? Skin that does  not quickly return to normal after being lightly pinched and released (poor skin turgor).  Changes in body fluids, such as: ? Extreme thirst. ? No tear production. ? Inability to sweat when body temperature is high, such as in hot weather. ? Very little urine production.  Changes in vital signs, such as: ? Weak pulse. ? Pulse that is more than 100 beats a minute when sitting still. ? Rapid breathing. ? Low blood pressure.  Other changes, such as: ? Sunken eyes. ? Cold hands and feet. ? Confusion. ? Lack of energy (lethargy). ? Difficulty waking up from sleep. ? Short-term weight loss. ? Unconsciousness. How is this diagnosed? This condition is diagnosed based on your symptoms and a physical exam. Blood and urine tests may be done to help confirm the diagnosis. How is this treated? Treatment for this condition depends on the severity. Mild or moderate dehydration can often be treated at home. Treatment should be started right away. Do not wait until dehydration becomes severe. Severe dehydration is an emergency and it needs to be treated in a hospital. Treatment for mild dehydration may include:  Drinking more fluids.  Replacing salts and minerals in your blood (electrolytes) that you may have lost. Treatment for moderate dehydration may include:  Drinking an oral rehydration solution (ORS). This is a drink that helps you replace fluids and electrolytes (rehydrate). It can be found at pharmacies and retail stores. Treatment for severe dehydration may include:  Receiving fluids through an IV tube.  Receiving an electrolyte solution through a feeding tube that is passed through your nose   and into your stomach (nasogastric tube, or NG tube).  Correcting any abnormalities in electrolytes.  Treating the underlying cause of dehydration. Follow these instructions at home:  If directed by your health care provider, drink an ORS: ? Make an ORS by following instructions on the  package. ? Start by drinking small amounts, about  cup (120 mL) every 5-10 minutes. ? Slowly increase how much you drink until you have taken the amount recommended by your health care provider.  Drink enough clear fluid to keep your urine clear or pale yellow. If you were told to drink an ORS, finish the ORS first, then start slowly drinking other clear fluids. Drink fluids such as: ? Water. Do not drink only water. Doing that can lead to having too little salt (sodium) in the body (hyponatremia). ? Ice chips. ? Fruit juice that you have added water to (diluted fruit juice). ? Low-calorie sports drinks.  Avoid: ? Alcohol. ? Drinks that contain a lot of sugar. These include high-calorie sports drinks, fruit juice that is not diluted, and soda. ? Caffeine. ? Foods that are greasy or contain a lot of fat or sugar.  Take over-the-counter and prescription medicines only as told by your health care provider.  Do not take sodium tablets. This can lead to having too much sodium in the body (hypernatremia).  Eat foods that contain a healthy balance of electrolytes, such as bananas, oranges, potatoes, tomatoes, and spinach.  Keep all follow-up visits as told by your health care provider. This is important. Contact a health care provider if:  You have abdominal pain that: ? Gets worse. ? Stays in one area (localizes).  You have a rash.  You have a stiff neck.  You are more irritable than usual.  You are sleepier or more difficult to wake up than usual.  You feel weak or dizzy.  You feel very thirsty.  You have urinated only a small amount of very dark urine over 6-8 hours. Get help right away if:  You have symptoms of severe dehydration.  You cannot drink fluids without vomiting.  Your symptoms get worse with treatment.  You have a fever.  You have a severe headache.  You have vomiting or diarrhea that: ? Gets worse. ? Does not go away.  You have blood or green matter  (bile) in your vomit.  You have blood in your stool. This may cause stool to look black and tarry.  You have not urinated in 6-8 hours.  You faint.  Your heart rate while sitting still is over 100 beats a minute.  You have trouble breathing. This information is not intended to replace advice given to you by your health care provider. Make sure you discuss any questions you have with your health care provider. Document Released: 05/16/2005 Document Revised: 12/11/2015 Document Reviewed: 07/10/2015 Elsevier Interactive Patient Education  2018 [employer]  

## 2018-03-09 ENCOUNTER — Ambulatory Visit: Payer: Self-pay | Admitting: Urology

## 2018-03-09 LAB — URINE CULTURE: CULTURE: NO GROWTH

## 2018-03-12 ENCOUNTER — Encounter: Payer: Self-pay | Admitting: Family

## 2018-03-12 ENCOUNTER — Inpatient Hospital Stay: Payer: BLUE CROSS/BLUE SHIELD

## 2018-03-12 ENCOUNTER — Other Ambulatory Visit: Payer: Self-pay

## 2018-03-12 ENCOUNTER — Telehealth: Payer: Self-pay | Admitting: *Deleted

## 2018-03-12 ENCOUNTER — Ambulatory Visit (HOSPITAL_BASED_OUTPATIENT_CLINIC_OR_DEPARTMENT_OTHER)
Admission: RE | Admit: 2018-03-12 | Discharge: 2018-03-12 | Disposition: A | Discharge status: Home / Self Care | Payer: BLUE CROSS/BLUE SHIELD | Source: Ambulatory Visit | Attending: Hematology & Oncology | Admitting: Hematology & Oncology

## 2018-03-12 ENCOUNTER — Inpatient Hospital Stay (HOSPITAL_BASED_OUTPATIENT_CLINIC_OR_DEPARTMENT_OTHER): Payer: BLUE CROSS/BLUE SHIELD | Admitting: Family

## 2018-03-12 VITALS — BP 90/61 | HR 116 | Temp 97.8°F | Resp 18 | Wt 141.0 lb

## 2018-03-12 DIAGNOSIS — J90 Pleural effusion, not elsewhere classified: Secondary | ICD-10-CM | POA: Insufficient documentation

## 2018-03-12 DIAGNOSIS — C3492 Malignant neoplasm of unspecified part of left bronchus or lung: Secondary | ICD-10-CM

## 2018-03-12 DIAGNOSIS — R0602 Shortness of breath: Secondary | ICD-10-CM | POA: Diagnosis present

## 2018-03-12 DIAGNOSIS — Z79899 Other long term (current) drug therapy: Secondary | ICD-10-CM

## 2018-03-12 DIAGNOSIS — Z923 Personal history of irradiation: Secondary | ICD-10-CM | POA: Diagnosis not present

## 2018-03-12 DIAGNOSIS — R110 Nausea: Secondary | ICD-10-CM

## 2018-03-12 DIAGNOSIS — C7931 Secondary malignant neoplasm of brain: Secondary | ICD-10-CM

## 2018-03-12 DIAGNOSIS — Z66 Do not resuscitate: Secondary | ICD-10-CM

## 2018-03-12 DIAGNOSIS — Z792 Long term (current) use of antibiotics: Secondary | ICD-10-CM

## 2018-03-12 DIAGNOSIS — C3412 Malignant neoplasm of upper lobe, left bronchus or lung: Secondary | ICD-10-CM | POA: Diagnosis not present

## 2018-03-12 DIAGNOSIS — R05 Cough: Secondary | ICD-10-CM | POA: Diagnosis present

## 2018-03-12 DIAGNOSIS — C7951 Secondary malignant neoplasm of bone: Secondary | ICD-10-CM

## 2018-03-12 DIAGNOSIS — Z95828 Presence of other vascular implants and grafts: Secondary | ICD-10-CM

## 2018-03-12 DIAGNOSIS — K219 Gastro-esophageal reflux disease without esophagitis: Secondary | ICD-10-CM

## 2018-03-12 LAB — CBC WITH DIFFERENTIAL (CANCER CENTER ONLY)
Abs Immature Granulocytes: 0 10*3/uL (ref 0.00–0.07)
Basophils Absolute: 0 10*3/uL (ref 0.0–0.1)
Basophils Relative: 1 %
Eosinophils Absolute: 0 10*3/uL (ref 0.0–0.5)
Eosinophils Relative: 1 %
HCT: 39 % (ref 36.0–46.0)
Hemoglobin: 13.2 g/dL (ref 12.0–15.0)
Immature Granulocytes: 0 %
Lymphocytes Relative: 40 %
Lymphs Abs: 0.4 10*3/uL — ABNORMAL LOW (ref 0.7–4.0)
MCH: 28.9 pg (ref 26.0–34.0)
MCHC: 33.8 g/dL (ref 30.0–36.0)
MCV: 85.5 fL (ref 80.0–100.0)
Monocytes Absolute: 0.4 10*3/uL (ref 0.1–1.0)
Monocytes Relative: 33 %
Neutro Abs: 0.3 10*3/uL — CL (ref 1.7–7.7)
Neutrophils Relative %: 25 %
Platelet Count: 188 10*3/uL (ref 150–400)
RBC: 4.56 MIL/uL (ref 3.87–5.11)
RDW: 13.5 % (ref 11.5–15.5)
WBC Count: 1.1 10*3/uL — ABNORMAL LOW (ref 4.0–10.5)
nRBC: 0 % (ref 0.0–0.2)

## 2018-03-12 LAB — CMP (CANCER CENTER ONLY)
ALT: 304 U/L — AB (ref 10–47)
AST: 240 U/L — AB (ref 11–38)
Albumin: 3.3 g/dL — ABNORMAL LOW (ref 3.5–5.0)
Alkaline Phosphatase: 349 U/L — ABNORMAL HIGH (ref 26–84)
Anion gap: 1 — ABNORMAL LOW (ref 5–15)
BILIRUBIN TOTAL: 1.4 mg/dL (ref 0.2–1.6)
BUN: 5 mg/dL — AB (ref 7–22)
CHLORIDE: 95 mmol/L — AB (ref 98–108)
CO2: 29 mmol/L (ref 18–33)
CREATININE: 0.5 mg/dL — AB (ref 0.60–1.20)
Calcium: 8.9 mg/dL (ref 8.0–10.3)
GLUCOSE: 85 mg/dL (ref 73–118)
Potassium: 3.7 mmol/L (ref 3.3–4.7)
Sodium: 125 mmol/L — ABNORMAL LOW (ref 128–145)
Total Protein: 6.4 g/dL (ref 6.4–8.1)

## 2018-03-12 LAB — LACTATE DEHYDROGENASE: LDH: 262 U/L — ABNORMAL HIGH (ref 98–192)

## 2018-03-12 MED ORDER — SODIUM CHLORIDE 0.9% FLUSH
10.0000 mL | INTRAVENOUS | Status: DC | PRN
  Administered 2018-03-12: 10 mL
  Filled 2018-03-12: qty 10

## 2018-03-12 MED ORDER — HEPARIN SOD (PORK) LOCK FLUSH 100 UNIT/ML IV SOLN
500.0000 [IU] | Freq: Once | INTRAVENOUS | Status: AC | PRN
  Administered 2018-03-12: 500 [IU]
  Filled 2018-03-12: qty 5

## 2018-03-12 MED ORDER — MEGESTROL ACETATE 625 MG/5ML PO SUSP: 625.0000 mg | Freq: Every day | ORAL | 0 refills | Status: AC

## 2018-03-12 MED FILL — MEGESTROL ACET 40 MG/ML SUS: 40 | 30 days supply | Qty: 480 | Fill #0

## 2018-03-12 NOTE — Telephone Encounter (Signed)
Spoke to [facility] at [place] per Dr Marin Olp.   Referral made. They will contact the patient.

## 2018-03-12 NOTE — Telephone Encounter (Addendum)
Critical Value ANC 0.3 AST 240 ALT 304 Dr Marin Olp notified. No orders at this time

## 2018-03-12 NOTE — Progress Notes (Signed)
Hematology and Oncology Follow Up Visit  Debbie Baker [id number] [birth year] 52 y.o. 03/12/2018   Principle Diagnosis:  Metastatic adenocarcinoma of the lung-no actionable mutations  Past Therapy: Maintenance therapy with Alimta/Avastin - s/p cycle #5 - d/c on 01/25/2018 for progression Radiation Therapy to the back - completed 02/19/2018  Current Therapy:   Gemzar/Navelbine q 21 days - started 03/01/2018 Xgeva 120 mg SQ q 3 months - next dose on 03/2018   Interim History: Debbie Baker is here today with her husband for follow-up. She is having a really hard time. She was not able to complete the Bactrim DS due to what sounds like GERD and nausea. Her urine culture was negative so we will not restart.  She states that she has felt that she can not take a deep breath but doe feel better since her thoracentesis last week. 1.2 liters of fluid were removed and pathology positive for malignancy. Her lung sounds are clear throughout at this time.  She had one episodes of coughing and states her phlegm had a small amount of blood in it.  VSS, O2 sat is 97% on room air.  No other episodes of blood loss noted.  She is not able to get up and move around much due to her aches and pains. She spends most of her time in her [place]. Her pain is most notable in her tailbone.  No fever, chills, vomiting, rash, dizziness, chest pain, palpitations or changes in bladder habits.  Her bowels have not been moving regularly and she will occasionally have stomach cramps. She plans to try Miralax 2-3 times a day in apple juice and see if this helps.  No swelling, numbness or tingling in her extremities at this time.  No lymphadenopathy noted on exam.  She states that Marinol made her feel paranoid and she is not taking this. She has no appetite and her weight has dropped another 10 lbs. She is doing her best to stay well hydrated.   ECOG Performance Status: 3 - Symptomatic, >50% confined to bed  Medications:    Allergies as of 03/12/2018      Reactions   Penicillins Anaphylaxis, Rash   Has patient had a PCN reaction causing immediate rash, facial/tongue/throat swelling, SOB or lightheadedness with hypotension: # # YES # # Has patient had a PCN reaction causing severe rash involving mucus membranes or skin necrosis: No Has patient had a PCN reaction that required hospitalization No Has patient had a PCN reaction occurring within the last 10 years: # # YES # #  If all of the above answers are "NO", then may proceed with Cephalosporin use.   Levofloxacin Other (See Comments)   Muscle weakness/difficulty walking Muscle weakness/difficulty walking   Varenicline Other (See Comments)   Nightmares/lack of sleep Nightmares/lack of sleep      Medication List        Accurate as of 03/12/18  1:32 PM. Always use your most recent med list.          albuterol 108 (90 Base) MCG/ACT inhaler Commonly known as:  PROVENTIL HFA;VENTOLIN HFA Inhale 2 puffs into the lungs every 6 (six) hours as needed for wheezing or shortness of breath.   antiseptic oral rinse Liqd 15 mLs by Mouth Rinse route as needed for dry mouth.   BENADRYL 25 mg capsule Generic drug:  diphenhydrAMINE Take 25 mg by mouth at bedtime as needed for allergies or sleep.   benzonatate 100 MG capsule Commonly known as:  TESSALON Take 1 capsule (100 mg total) by mouth every 4 (four) hours as needed for cough.   Calcium-Magnesium-Vitamin D 185-50-100 MG-MG-UNIT Caps Take 1 tablet by mouth every evening.   cetirizine 10 MG tablet Commonly known as:  ZYRTEC Take 10 mg by mouth daily.   clarithromycin 250 MG tablet Commonly known as:  BIAXIN Take 1 tablet (250 mg total) by mouth 2 (two) times daily.   dronabinol 5 MG capsule Commonly known as:  MARINOL Take 1 capsule (5 mg total) by mouth 2 (two) times daily before lunch and supper.   fentaNYL 100 MCG/HR Commonly known as:  DURAGESIC - dosed mcg/hr Place 1 patch (100 mcg total)  onto the skin every other day.   fentaNYL 50 MCG/HR Commonly known as:  DURAGESIC - dosed mcg/hr Place 1 patch (50 mcg total) onto the skin every other day.   folic acid 1 MG tablet Commonly known as:  FOLVITE Take 1 tablet (1 mg total) by mouth daily.   lidocaine-prilocaine cream Commonly known as:  EMLA Apply 1 application topically as needed.   LORazepam 0.5 MG tablet Commonly known as:  ATIVAN Take 1 tablet (0.5 mg total) by mouth as needed for anxiety (take 30 min prior to MRI scans and treatments).   magic mouthwash Soln Take 5 mLs by mouth 4 (four) times daily -  with meals and at bedtime. Called in 08/05/17 by on call RN, [employer]onents benadryl  525 mg, hydrocortisone 60 mg and nystatin 0.6 mg. 240 ml   magic mouthwash w/lidocaine Soln Take 5 mLs by mouth 4 (four) times daily as needed (pain with swallowing).   meloxicam 15 MG tablet Commonly known as:  MOBIC Take 1 tablet (15 mg total) by mouth daily.   Milk Thistle 1000 MG Caps Take 1,000 mg by mouth daily.   Oxycodone HCl 20 MG Tabs Take 1 tablet (20 mg total) by mouth every 4 (four) hours as needed.   pantoprazole 40 MG tablet Commonly known as:  PROTONIX Take 1 tablet (40 mg total) 2 (two) times daily by mouth.   pravastatin 20 MG tablet Commonly known as:  PRAVACHOL Take 20 mg by mouth daily.   PROBIOTIC ACIDOPHILUS Caps Take 1 capsule by mouth daily.   prochlorperazine 10 MG tablet Commonly known as:  COMPAZINE Take 1 tablet (10 mg total) by mouth every 6 (six) hours as needed for nausea or vomiting.   SAMBUCUS ELDERBERRY PO Take 50 mg by mouth daily.   sucralfate 1 g tablet Commonly known as:  CARAFATE Take 1 tablet (1 g total) by mouth 4 (four) times daily -  with meals and at bedtime. Crush and dissolve in 8 oz water   sulfamethoxazole-trimethoprim 800-160 MG tablet Commonly known as:  BACTRIM DS,SEPTRA DS Take 1 tablet by mouth 2 (two) times daily for 5 days.   TURMERIC CURCUMIN PO Take  2,050 mg by mouth 2 (two) times daily.   WOMENS 50+ MULTI VITAMIN/MIN Tabs Take 1 tablet by mouth daily.       Allergies:  Allergies  Allergen Reactions  . Penicillins Anaphylaxis and Rash     Has patient had a PCN reaction causing immediate rash, facial/tongue/throat swelling, SOB or lightheadedness with hypotension: # # YES # # Has patient had a PCN reaction causing severe rash involving mucus membranes or skin necrosis: No Has patient had a PCN reaction that required hospitalization No Has patient had a PCN reaction occurring within the last 10 years: # # YES # #  If all of the above answers are "NO", then may proceed with Cephalosporin use.   . Levofloxacin Other (See Comments)    Muscle weakness/difficulty walking Muscle weakness/difficulty walking  . Varenicline Other (See Comments)    Nightmares/lack of sleep Nightmares/lack of sleep    Past Medical History, Surgical history, Social history, and Family History were reviewed and updated.  Review of Systems: All other 10 point review of systems is negative.   Physical Exam:  vitals were not taken for this visit.   Wt Readings from Last 3 Encounters:  03/01/18 153 lb 8 oz (69.6 kg)  02/27/18 151 lb (68.5 kg)  02/20/18 150 lb (68 kg)    Ocular: Sclerae unicteric, pupils equal, round and reactive to light Ear-nose-throat: Oropharynx clear, dentition fair Lymphatic: No cervical, supraclavicular or axillary adenopathy Lungs no rales or rhonchi, good excursion bilaterally Heart regular rate and rhythm, no murmur appreciated Abd soft, nontender, positive bowel sounds, no liver or spleen tip palpated on exam, no fluid wave  MSK no focal spinal tenderness, no joint edema Neuro: non-focal, well-oriented, appropriate affect Breasts: Deferred   Lab Results  Component Value Date   WBC 1.6 (L) 03/08/2018   HGB 12.2 03/08/2018   HCT 37.1 03/08/2018   MCV 89.0 03/08/2018   PLT 54 (L) 03/08/2018   No results found for:  FERRITIN, IRON, TIBC, UIBC, IRONPCTSAT Lab Results  Component Value Date   RBC 4.17 03/08/2018   No results found for: KPAFRELGTCHN, LAMBDASER, KAPLAMBRATIO No results found for: IGGSERUM, IGA, IGMSERUM No results found for: Odetta Pink, SPEI   Chemistry      Component Value Date/Time   NA 128 03/08/2018 0935   NA 140 05/22/2017 1212   K 3.3 03/08/2018 0935   K 4.2 05/22/2017 1212   CL 94 (L) 03/08/2018 0935   CO2 29 03/08/2018 0935   CO2 21 (L) 05/22/2017 1212   BUN 6 (L) 03/08/2018 0935   BUN 10.3 05/22/2017 1212   CREATININE 0.60 03/08/2018 0935   CREATININE 0.8 05/22/2017 1212      Component Value Date/Time   CALCIUM 8.7 03/08/2018 0935   CALCIUM 9.6 05/22/2017 1212   ALKPHOS 321 (H) 03/08/2018 0935   ALKPHOS 113 05/22/2017 1212   AST 116 (H) 03/08/2018 0935   AST 17 05/22/2017 1212   ALT 116 (H) 03/08/2018 0935   ALT 27 05/22/2017 1212   BILITOT 1.8 (H) 03/08/2018 0935   BILITOT 0.31 05/22/2017 1212      Impression and Plan: Debbie Baker is a very pleasant 52 yo caucasian female with metastatic adenocarcinoma of the lung. Her diagnosis has really taken a toll on her and she has no tolerated treatment well. After a long talk with Dr. Marin Olp, she is ready to consult hospice. She states that she is at peace with this and is ready to stop treatment. She would like a good quality of life and does not feel that she will have this if she continues treatment.  We will contact [facility] today.  She was also made a DNR today per her request. We will have her try Megace ES for appetite.  We will get a chest xray today to assess for recurrence of pleural effusion. She may benefit from having a Pleur X catheter placed if fluid continues to build up exacerbating her symptoms.  We will follow-up with her as needed.  They will contact our office with any questions or concerns. We  can certainly see her sooner if need be.    Laverna Peace, NP 10/14/20191:32 PM     Addendum: I saw Ms.  Debbie Baker along with Debbie Baker Roch.  I really had a long talk with she and her husband.  Everything I told her was of no surprise.  Debbie Baker does not want to have any further chemotherapy.  We only gave her 1 cycle of dose reduced Gemzar/novel being.  She did not do well with this at all.  She is still somewhat neutropenic from this.  She just has felt incredibly tired.  I think her body has been through a lot.  She had a pleural fluid removed from her right lung last week.  Unfortunately, the cytology on the pleural fluid showed adenocarcinoma.  I believe that it is time for [place] to focus on comfort care.  I think that we need to focus on just making sure she is comfortable and has respect and dignity.  She does have brain metastasis.  I think these probably do need to be attended to and treated so that she will not have problems down the road with seizures or neurological issues.  Debbie Baker is not surprised by any of this.  She, herself, felt that hospice would be the best option for her.  Of note, she has lost quite a bit of weight.  She is not hungry.  I told her that it was okay if she did not eat.  I think her body was just telling her that she just does not need any food right now.  I just feel bad for Debbie Baker.  She really has had a hard time with all the pain that she is had.  Her metastasis have mostly been in her bones and have caused her an immense amount of discomfort.  I did not tell her how long I thought she had.  However, given the fact that her pleural fluid is positive, and that her liver function test are significantly higher, I would be somewhat surprised if she made it more than 4-6 weeks.  Hopefully she will make it to Thanksgiving.  We talked about end-of-life issues.  She was quite insistent that no heroic measures to be done to keep her alive.  She does not want intubation, CPR, etc.  As such, she is a DO NOT  RESUSCITATE.  We will make sure that [facility] are called so that they can start seeing her.  I will try her on some Megace ES to see if this can help with her appetite.  There are medications that she can stop taking now.  We went over her medication list.  We spent about 40 minutes with she and her husband.  All the time was spent face-to-face with them.  I wanted to make sure that we respect her decisions and make sure that comfort and dignity are the primary goals now.  Lattie Haw, MD

## 2018-03-12 NOTE — Patient Instructions (Signed)
Implanted Port Home Guide An implanted port is a type of central line that is placed under the skin. Central lines are used to provide IV access when treatment or nutrition needs to be given through a person's veins. Implanted ports are used for long-term IV access. An implanted port may be placed because:  You need IV medicine that would be irritating to the small veins in your hands or arms.  You need long-term IV medicines, such as antibiotics.  You need IV nutrition for a long period.  You need frequent blood draws for lab tests.  You need dialysis.  Implanted ports are usually placed in the chest area, but they can also be placed in the upper arm, the abdomen, or the leg. An implanted port has two main parts:  Reservoir. The reservoir is round and will appear as a small, raised area under your skin. The reservoir is the part where a needle is inserted to give medicines or draw blood.  Catheter. The catheter is a thin, flexible tube that extends from the reservoir. The catheter is placed into a large vein. Medicine that is inserted into the reservoir goes into the catheter and then into the vein.  How will I care for my incision site? Do not get the incision site wet. Bathe or shower as directed by your health care provider. How is my port accessed? Special steps must be taken to access the port:  Before the port is accessed, a numbing cream can be placed on the skin. This helps numb the skin over the port site.  Your health care provider uses a sterile technique to access the port. ? Your health care provider must put on a mask and sterile gloves. ? The skin over your port is cleaned carefully with an antiseptic and allowed to dry. ? The port is gently pinched between sterile gloves, and a needle is inserted into the port.  Only "non-coring" port needles should be used to access the port. Once the port is accessed, a blood return should be checked. This helps ensure that the port  is in the vein and is not clogged.  If your port needs to remain accessed for a constant infusion, a clear (transparent) bandage will be placed over the needle site. The bandage and needle will need to be changed every week, or as directed by your health care provider.  Keep the bandage covering the needle clean and dry. Do not get it wet. Follow your health care provider's instructions on how to take a shower or bath while the port is accessed.  If your port does not need to stay accessed, no bandage is needed over the port.  What is flushing? Flushing helps keep the port from getting clogged. Follow your health care provider's instructions on how and when to flush the port. Ports are usually flushed with saline solution or a medicine called heparin. The need for flushing will depend on how the port is used.  If the port is used for intermittent medicines or blood draws, the port will need to be flushed: ? After medicines have been given. ? After blood has been drawn. ? As part of routine maintenance.  If a constant infusion is running, the port may not need to be flushed.  How long will my port stay implanted? The port can stay in for as long as your health care provider thinks it is needed. When it is time for the port to come out, surgery will be   done to remove it. The procedure is similar to the one performed when the port was put in. When should I seek immediate medical care? When you have an implanted port, you should seek immediate medical care if:  You notice a bad smell coming from the incision site.  You have swelling, redness, or drainage at the incision site.  You have more swelling or pain at the port site or the surrounding area.  You have a fever that is not controlled with medicine.  This information is not intended to replace advice given to you by your health care provider. Make sure you discuss any questions you have with your health care provider. Document  Released: 05/16/2005 Document Revised: 10/22/2015 Document Reviewed: 01/21/2013 Elsevier Interactive Patient Education  2017 [employer]  

## 2018-03-12 NOTE — Addendum Note (Signed)
Addended by: Perlie Gold on: 03/12/2018 02:31 PM   Modules accepted: Orders

## 2018-03-13 ENCOUNTER — Telehealth: Payer: Self-pay | Admitting: *Deleted

## 2018-03-13 NOTE — Telephone Encounter (Signed)
Notified pt of chest Xray results. No concerns at this time.

## 2018-03-16 ENCOUNTER — Encounter (HOSPITAL_COMMUNITY): Payer: Self-pay | Admitting: Hematology & Oncology

## 2018-03-20 ENCOUNTER — Ambulatory Visit (HOSPITAL_COMMUNITY): Payer: BLUE CROSS/BLUE SHIELD

## 2018-03-22 ENCOUNTER — Institutional Professional Consult (permissible substitution): Payer: BLUE CROSS/BLUE SHIELD | Admitting: Urology

## 2018-03-22 ENCOUNTER — Other Ambulatory Visit: Payer: BLUE CROSS/BLUE SHIELD

## 2018-03-22 ENCOUNTER — Ambulatory Visit: Payer: BLUE CROSS/BLUE SHIELD

## 2018-03-22 ENCOUNTER — Ambulatory Visit: Payer: BLUE CROSS/BLUE SHIELD | Admitting: Family

## 2018-03-22 ENCOUNTER — Other Ambulatory Visit: Payer: Self-pay | Admitting: Gastroenterology

## 2018-03-22 ENCOUNTER — Other Ambulatory Visit: Payer: Self-pay | Admitting: Radiation Therapy

## 2018-03-26 ENCOUNTER — Telehealth: Payer: Self-pay | Admitting: Radiation Oncology

## 2018-03-26 NOTE — Telephone Encounter (Signed)
Received voicemail message from Jan at [facility] questioning if patient would be present for MRI with sedation scheduled for Tuesday (03/27/2018) at 0800 since notes indicate she has transitioned to [facility]. Questioned Mont Dutton, RT since she scheduled the appointment. Per Manuela Schwartz patient can't make appointment tomorrow but wants to reschedule for a later date. Informed Jan of this finding and she verbalized understanding.

## 2018-03-26 NOTE — Progress Notes (Addendum)
In reviewing Debbie Baker's chart, I see that patient's desire is to stop treatment, Hospice has been ordered.  Patient is scheduled for a MRI  With anesthesia 03/27/18, I called and left a message for Samantha at Dr Johny Shears office with information and to inquire if MRI is going to be cancelled.Aldona Bar called back and said she spoke with patient, MRI will be cancelled for tomorrow, but is being rescheduled.

## 2018-03-27 ENCOUNTER — Ambulatory Visit (HOSPITAL_COMMUNITY): Payer: BLUE CROSS/BLUE SHIELD

## 2018-03-28 ENCOUNTER — Inpatient Hospital Stay
Admission: RE | Admit: 2018-03-28 | Discharge: 2018-03-28 | Disposition: A | Discharge status: Home / Self Care | Payer: BLUE CROSS/BLUE SHIELD | Source: Ambulatory Visit | Attending: Urology | Admitting: Urology

## 2018-03-29 ENCOUNTER — Other Ambulatory Visit: Payer: BLUE CROSS/BLUE SHIELD

## 2018-03-29 ENCOUNTER — Ambulatory Visit: Payer: BLUE CROSS/BLUE SHIELD

## 2018-04-24 ENCOUNTER — Telehealth: Payer: Self-pay | Admitting: *Deleted

## 2018-04-24 NOTE — Progress Notes (Signed)
I left a message on the nurse line, requesting that MRI be cancelled for 12/3.

## 2018-04-24 NOTE — Telephone Encounter (Signed)
Received call from [facility] that the patient passed away on 2018-04-29.

## 2018-04-29 DEATH — deceased

## 2018-04-30 ENCOUNTER — Other Ambulatory Visit: Payer: Self-pay | Admitting: Hematology & Oncology

## 2018-05-01 ENCOUNTER — Ambulatory Visit (HOSPITAL_COMMUNITY): Payer: BLUE CROSS/BLUE SHIELD

## 2018-05-01 ENCOUNTER — Ambulatory Visit: Admit: 2018-05-01 | Payer: BLUE CROSS/BLUE SHIELD

## 2018-05-01 SURGERY — MRI WITH ANESTHESIA
Anesthesia: General

## 2018-05-03 ENCOUNTER — Inpatient Hospital Stay: Admission: RE | Admit: 2018-05-03 | Payer: BLUE CROSS/BLUE SHIELD | Source: Ambulatory Visit | Admitting: Urology

## 2018-05-03 ENCOUNTER — Inpatient Hospital Stay: Admission: RE | Admit: 2018-05-03 | Payer: BLUE CROSS/BLUE SHIELD | Source: Ambulatory Visit

## 2018-05-30 DEATH — deceased

## 2019-02-28 DEATH — deceased
